# Patient Record
Sex: Male | Born: 1935 | Race: White | Hispanic: No | Marital: Married | Smoking: Former smoker
Health system: Southern US, Community
[De-identification: ages and names within clinical notes are randomized; demographics above are authoritative.]

## PROBLEM LIST (undated history)

## (undated) DIAGNOSIS — R053 Chronic cough: Secondary | ICD-10-CM

## (undated) DIAGNOSIS — S060X9A Concussion with loss of consciousness of unspecified duration, initial encounter: Secondary | ICD-10-CM

## (undated) DIAGNOSIS — R269 Unspecified abnormalities of gait and mobility: Secondary | ICD-10-CM

## (undated) DIAGNOSIS — R9089 Other abnormal findings on diagnostic imaging of central nervous system: Secondary | ICD-10-CM

## (undated) DIAGNOSIS — N400 Enlarged prostate without lower urinary tract symptoms: Secondary | ICD-10-CM

## (undated) DIAGNOSIS — B351 Tinea unguium: Secondary | ICD-10-CM

## (undated) DIAGNOSIS — S0291XA Unspecified fracture of skull, initial encounter for closed fracture: Secondary | ICD-10-CM

## (undated) DIAGNOSIS — I10 Essential (primary) hypertension: Secondary | ICD-10-CM

## (undated) DIAGNOSIS — N183 Chronic kidney disease, stage 3 unspecified: Secondary | ICD-10-CM

## (undated) DIAGNOSIS — E785 Hyperlipidemia, unspecified: Secondary | ICD-10-CM

## (undated) DIAGNOSIS — G4733 Obstructive sleep apnea (adult) (pediatric): Secondary | ICD-10-CM

## (undated) DIAGNOSIS — K219 Gastro-esophageal reflux disease without esophagitis: Secondary | ICD-10-CM

## (undated) DIAGNOSIS — C44519 Basal cell carcinoma of skin of other part of trunk: Secondary | ICD-10-CM

## (undated) DIAGNOSIS — R6882 Decreased libido: Secondary | ICD-10-CM

## (undated) DIAGNOSIS — R413 Other amnesia: Secondary | ICD-10-CM

## (undated) DIAGNOSIS — M545 Low back pain, unspecified: Secondary | ICD-10-CM

## (undated) DIAGNOSIS — I5189 Other ill-defined heart diseases: Secondary | ICD-10-CM

## (undated) DIAGNOSIS — G4730 Sleep apnea, unspecified: Secondary | ICD-10-CM

## (undated) DIAGNOSIS — G629 Polyneuropathy, unspecified: Secondary | ICD-10-CM

## (undated) DIAGNOSIS — M21372 Foot drop, left foot: Secondary | ICD-10-CM

## (undated) DIAGNOSIS — M21371 Foot drop, right foot: Secondary | ICD-10-CM

## (undated) DIAGNOSIS — N529 Male erectile dysfunction, unspecified: Secondary | ICD-10-CM

## (undated) DIAGNOSIS — R972 Elevated prostate specific antigen [PSA]: Secondary | ICD-10-CM

## (undated) HISTORY — DX: Essential (primary) hypertension: I10

## (undated) HISTORY — PX: OTHER SURGICAL HISTORY: SHX169

## (undated) HISTORY — DX: Chronic kidney disease, stage 3 unspecified: N18.30

## (undated) HISTORY — DX: Decreased libido: R68.82

## (undated) HISTORY — DX: Low back pain: M54.5

## (undated) HISTORY — PX: CATARACT EXTRACTION: SUR2

## (undated) HISTORY — DX: Obstructive sleep apnea (adult) (pediatric): G47.33

## (undated) HISTORY — DX: Elevated prostate specific antigen (PSA): R97.20

## (undated) HISTORY — DX: Unspecified fracture of skull, initial encounter for closed fracture: S02.91XA

## (undated) HISTORY — DX: Hyperlipidemia, unspecified: E78.5

## (undated) HISTORY — DX: Unspecified abnormalities of gait and mobility: R26.9

## (undated) HISTORY — DX: Basal cell carcinoma of skin of other part of trunk: C44.519

## (undated) HISTORY — DX: Foot drop, left foot: M21.372

## (undated) HISTORY — DX: Chronic cough: R05.3

## (undated) HISTORY — DX: Benign prostatic hyperplasia without lower urinary tract symptoms: N40.0

## (undated) HISTORY — DX: Concussion with loss of consciousness of unspecified duration, initial encounter: S06.0X9A

## (undated) HISTORY — DX: Other ill-defined heart diseases: I51.89

## (undated) HISTORY — DX: Polyneuropathy, unspecified: G62.9

## (undated) HISTORY — DX: Foot drop, right foot: M21.371

## (undated) HISTORY — PX: VASECTOMY: SHX75

## (undated) HISTORY — DX: Low back pain, unspecified: M54.50

## (undated) HISTORY — PX: TONSILLECTOMY: SUR1361

## (undated) HISTORY — DX: Gastro-esophageal reflux disease without esophagitis: K21.9

## (undated) HISTORY — DX: Other amnesia: R41.3

## (undated) HISTORY — DX: Other abnormal findings on diagnostic imaging of central nervous system: R90.89

## (undated) HISTORY — DX: Tinea unguium: B35.1

## (undated) HISTORY — DX: Male erectile dysfunction, unspecified: N52.9

## (undated) HISTORY — PX: KNEE SURGERY: SHX244

---
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
## 2000-07-03 ENCOUNTER — Encounter: Payer: Self-pay | Admitting: Family Medicine

## 2000-07-03 ENCOUNTER — Ambulatory Visit (HOSPITAL_COMMUNITY): Admission: RE | Admit: 2000-07-03 | Discharge: 2000-07-03 | Payer: Self-pay | Admitting: Family Medicine

## 2000-08-24 ENCOUNTER — Encounter: Payer: Self-pay | Admitting: *Deleted

## 2000-08-24 ENCOUNTER — Ambulatory Visit (HOSPITAL_COMMUNITY): Admission: RE | Admit: 2000-08-24 | Discharge: 2000-08-24 | Payer: Self-pay | Admitting: *Deleted

## 2000-10-01 ENCOUNTER — Ambulatory Visit (HOSPITAL_COMMUNITY): Admission: RE | Admit: 2000-10-01 | Discharge: 2000-10-01 | Payer: Self-pay | Admitting: *Deleted

## 2002-01-03 ENCOUNTER — Encounter: Payer: Self-pay | Admitting: Family Medicine

## 2002-01-03 ENCOUNTER — Ambulatory Visit (HOSPITAL_COMMUNITY): Admission: RE | Admit: 2002-01-03 | Discharge: 2002-01-03 | Payer: Self-pay | Admitting: Family Medicine

## 2004-06-10 ENCOUNTER — Ambulatory Visit: Payer: Self-pay | Admitting: Pulmonary Disease

## 2004-06-13 ENCOUNTER — Ambulatory Visit: Payer: Self-pay | Admitting: Pulmonary Disease

## 2004-06-22 ENCOUNTER — Ambulatory Visit: Payer: Self-pay | Admitting: Pulmonary Disease

## 2005-06-19 ENCOUNTER — Encounter: Admission: RE | Admit: 2005-06-19 | Discharge: 2005-06-19 | Payer: Self-pay | Admitting: Internal Medicine

## 2005-08-10 ENCOUNTER — Ambulatory Visit (HOSPITAL_COMMUNITY): Admission: RE | Admit: 2005-08-10 | Discharge: 2005-08-10 | Payer: Self-pay | Admitting: Family Medicine

## 2005-08-10 ENCOUNTER — Encounter: Payer: Self-pay | Admitting: Vascular Surgery

## 2006-01-22 ENCOUNTER — Encounter: Admission: RE | Admit: 2006-01-22 | Discharge: 2006-01-22 | Payer: Self-pay | Admitting: Orthopedic Surgery

## 2006-01-25 ENCOUNTER — Ambulatory Visit (HOSPITAL_BASED_OUTPATIENT_CLINIC_OR_DEPARTMENT_OTHER): Admission: RE | Admit: 2006-01-25 | Discharge: 2006-01-25 | Payer: Self-pay | Admitting: Orthopedic Surgery

## 2007-09-06 ENCOUNTER — Encounter: Admission: RE | Admit: 2007-09-06 | Discharge: 2007-09-06 | Payer: Self-pay | Admitting: Internal Medicine

## 2010-08-05 NOTE — Op Note (Signed)
NAME:  Timothy Khan, Timothy Khan          ACCOUNT NO.:  [id number]   MEDICAL RECORD NO.:  [id number]          PATIENT TYPE:  AMB   LOCATION:  [facility]                         FACILITY:  [facility]   PHYSICIAN:  Ollen Gross, M.D.    DATE OF BIRTH:  [birth year]   DATE OF PROCEDURE:  01/25/2006  DATE OF DISCHARGE:                                 OPERATIVE REPORT   PREOPERATIVE DIAGNOSIS:  Right knee medial meniscal tear.   POSTOPERATIVE DIAGNOSIS:  Right knee medial meniscal tear plus chondral  defect medial femoral condyle.   PROCEDURE:  Right knee arthroscopy with meniscal debridement and  chondroplasty.   SURGEON:  Ollen Gross, M.D.   ASSISTANT:  None.   ANESTHESIA:  General.   ESTIMATED BLOOD LOSS:  Minimal.   DRAINS:  None.   COMPLICATIONS:  None.  Condition stable to recovery.   CLINICAL NOTE:  [name] is a 77 year old male injured his right knee  this past summer with significant pain and mechanical symptoms.  Exam and  history suggest a medial meniscal tear confirmed by MRI.  Presents now for  arthroscopy and debridement.   PROCEDURE IN DETAIL:  After successful administration of general anesthetic,  a tourniquet placed high on the right thigh and right lower extremity  prepped and draped in the usual sterile fashion.  Standard superomedial and  inferolateral incisions were made.  In flow cannula passed superomedial,  camera passed anterolateral.  Arthroscopic visualization proceeds.  The  undersurface of patella showed some low grade chondromalacia and trochlea  looks normal.  Medial and lateral gutters visualized.  There were no loose  bodies.  Flexion valgus force applied to the knee and the medial compartment  centered.  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  Spinal needles used to localize the inferomedial portal.  Small  incision made, dilator placed and then  we debrided the meniscus back to  stable base with baskets and a 4.2 mm shaver.  The area of chondral  delamination is debrided back to stable cartilaginous base with the 4.2 mm  shaver and then probed and found to be stable.  Intercondylar notch was  visualized, ACL was normal.  Lateral compartment centered and there is a  very small focal chondral defect but it is probed and is stable.  Lateral  meniscus and tibial plateau looked normal.  A defect on the center of the  lateral femoral condyle is about 3 mm x 4 mm.  The arthroscopic equipment is  then removed from the inferior portals which are closed with interrupted 4-0  nylon.  20 mL of 0.25% Marcaine with epi injected through the inflow cannula  and that is removed and that portal closed with nylon.  A bulky sterile  dressing is applied and he is subsequently awakened and transferred to  recovery in stable condition.      Ollen Gross, M.D.  Electronically Signed     FA/MEDQ  D:  01/25/2006  T:  01/26/2006  Job:  [id number]

## 2010-08-05 NOTE — Cardiovascular Report (Signed)
[facility]  Patient:    Timothy Khan, Timothy Khan                 MRN: [id number] Proc. Date: 08/24/00 Adm. Date:  [id number] Disc. Date: [id number] Attending:  Mora Appl CC:         Meredith Staggers, M.D.   Cardiac Catheterization  REFERRING PHYSICIAN:  Meredith Staggers, M.D.  INDICATIONS FOR PROCEDURE:  The patient is a very pleasant 77 year old gentleman, who was referred for evaluation of dyspnea.  He notes that he has been an avid runner in the past running approximately 20 miles per week, but has recently noticed his stress tolerance has decreased.  He was scheduled for a stress Cardiolite at 4 minutes into the test and developed some shortness of breath which was accompanied by significant ST depression.  There was no complaint of chest pain during the stress test.  Risk factors are significant for age, family history, male sex.  Cardiolite portion of the test revealed normal Cardiolite uptake.  Risks, benefits, and options were discussed with the patient and he wishes to proceed with coronary angiography.  DESCRIPTION OF PROCEDURE:  After obtaining written informed consent, the patient was brought to the cardiac catheterization lab in the postabsorptive state.  Preoperative sedation was achieved with IV Versed.  The right groin was prepped and draped in the usual sterile fashion. Local anesthesia was achieved using 1% Xylocaine.  A 6 French hemostasis sheath was placed into the right femoral artery using a modified Seldinger technique.  Selective coronary angiography was performed using a JL4, JR4 Judkins catheter.  All catheter exchange were made over a guide wire.  Single plane ventriculogram was performed in the RAO position using a 6 French pigtail curved catheter. Following review of the films there was no identifiable coronary artery disease.  The patient was transferred to the holding area.  The hemostasis sheath was  removed.  Hemostasis was achieved using digital pressure.  FINDINGS:  The aorta pressure is 139/62, LV pressure is 139/17.  Single plane ventriculogram revealed normal wall motion.  Ejection fraction of 70%.  Left main coronary artery:  Left main coronary artery bifurcated into the left anterior descending and circumflex vessel.  There was no significant disease in the left main coronary artery.  Left anterior descending:  Left anterior descending gave rise to a small diagonal #1, large diagonal #2 and ended as an apical recurrent branch.  There was no significant disease in the left anterior descending or its branches.  Circumflex vessel:  The circumflex vessel gave rise to a small OM-1, OM-2, moderate sized OM-3 and OM-4.  There was no significant disease in the obtuse marginals.  Right coronary artery:  The right coronary artery was dominant giving rise to a moderate sized PDA and PL branch.  There was no significant disease in the right coronary artery or its branches.  IMPRESSION: 1. Normal coronary angiography. 2. Normal ventriculogram, ejection fraction of approximately 70%.  RECOMMENDATIONS:  We will consider other etiologies for his dyspnea.  A chest x-ray will be obtained.  Consideration of pulmonary function test will be considered.  However, should note that there was no wheezing on his stress portion. DD:  08/24/00 TD:  08/24/00 Job: [id number]

## 2011-04-11 ENCOUNTER — Other Ambulatory Visit: Payer: Self-pay | Admitting: Internal Medicine

## 2011-04-11 DIAGNOSIS — M5430 Sciatica, unspecified side: Secondary | ICD-10-CM

## 2011-04-14 ENCOUNTER — Ambulatory Visit
Admission: RE | Admit: 2011-04-14 | Discharge: 2011-04-14 | Disposition: A | Discharge status: Home / Self Care | Payer: Medicare Other | Source: Ambulatory Visit | Attending: Internal Medicine | Admitting: Internal Medicine

## 2011-04-14 DIAGNOSIS — M5430 Sciatica, unspecified side: Secondary | ICD-10-CM

## 2012-03-18 DIAGNOSIS — E785 Hyperlipidemia, unspecified: Secondary | ICD-10-CM | POA: Insufficient documentation

## 2012-03-18 DIAGNOSIS — S060X0A Concussion without loss of consciousness, initial encounter: Secondary | ICD-10-CM | POA: Insufficient documentation

## 2012-03-18 DIAGNOSIS — G609 Hereditary and idiopathic neuropathy, unspecified: Secondary | ICD-10-CM | POA: Insufficient documentation

## 2012-03-18 DIAGNOSIS — IMO0002 Reserved for concepts with insufficient information to code with codable children: Secondary | ICD-10-CM | POA: Insufficient documentation

## 2012-03-18 DIAGNOSIS — I10 Essential (primary) hypertension: Secondary | ICD-10-CM | POA: Insufficient documentation

## 2012-03-18 DIAGNOSIS — R269 Unspecified abnormalities of gait and mobility: Secondary | ICD-10-CM | POA: Insufficient documentation

## 2012-09-04 ENCOUNTER — Encounter: Payer: Self-pay | Admitting: Neurology

## 2012-09-04 DIAGNOSIS — R269 Unspecified abnormalities of gait and mobility: Secondary | ICD-10-CM

## 2012-09-04 DIAGNOSIS — I10 Essential (primary) hypertension: Secondary | ICD-10-CM

## 2012-09-04 DIAGNOSIS — G609 Hereditary and idiopathic neuropathy, unspecified: Secondary | ICD-10-CM

## 2012-09-04 DIAGNOSIS — E785 Hyperlipidemia, unspecified: Secondary | ICD-10-CM

## 2012-09-04 DIAGNOSIS — IMO0002 Reserved for concepts with insufficient information to code with codable children: Secondary | ICD-10-CM

## 2012-09-08 ENCOUNTER — Other Ambulatory Visit: Payer: Self-pay

## 2012-09-08 MED ORDER — GABAPENTIN 100 MG PO CAPS: 100.0000 mg | ORAL_CAPSULE | Freq: Every day | ORAL | Status: DC

## 2012-09-08 NOTE — Telephone Encounter (Signed)
Former [facility] patient assigned to Dr Willis  

## 2012-09-16 ENCOUNTER — Ambulatory Visit: Payer: Self-pay | Admitting: Neurology

## 2012-10-14 ENCOUNTER — Ambulatory Visit (INDEPENDENT_AMBULATORY_CARE_PROVIDER_SITE_OTHER): Payer: Medicare Other | Admitting: Neurology

## 2012-10-14 ENCOUNTER — Encounter: Payer: Self-pay | Admitting: Neurology

## 2012-10-14 VITALS — BP 128/76 | HR 83 | Wt 172.0 lb

## 2012-10-14 DIAGNOSIS — G609 Hereditary and idiopathic neuropathy, unspecified: Secondary | ICD-10-CM

## 2012-10-14 DIAGNOSIS — IMO0002 Reserved for concepts with insufficient information to code with codable children: Secondary | ICD-10-CM

## 2012-10-14 DIAGNOSIS — R269 Unspecified abnormalities of gait and mobility: Secondary | ICD-10-CM

## 2012-10-14 MED ORDER — GABAPENTIN 300 MG PO CAPS: 300.0000 mg | ORAL_CAPSULE | Freq: Every day | ORAL | Status: DC

## 2012-10-14 NOTE — Progress Notes (Signed)
Reason for visit: Peripheral neuropathy  Timothy Khan is an 79 y.o. male  History of present illness:  Timothy Khan is a 79 year old right-handed white male with a history of a peripheral neuropathy associated with a gait disorder. She falls on occasion, with the last fall about 3 months ago. The patient indicates that he is at risk for falling when he makes a sudden turn, or when he is in a situation where it his vision is impaired and he is standing. The patient will use a cane or a walking stick when he is out on uneven ground. The patient reports that he does not have significant issues with burning or stinging in the feet, but most of his pain is associated with his low back. The patient is active, and he is involved with silver sneakers, yoga, and cardiovascular workouts throughout the week. The patient has had MRI evaluation of the lumbosacral spine that shows evidence of disc disease at the L3-4 level with the right L3 nerve root compressed, and bilateral neuroforaminal stenosis at the L4-5 level and a small disc herniation at the L5-S1 level. The patient used to have pain down the right leg to the knee, but he indicates that chiropractic treatments have helped him. The patient denies any weakness of the lower extremities. The patient is on Ultram at night, but this does not help. The patient was on gabapentin taking 100-200 mg at night, but this also did not help. The patient returns for an evaluation.  Past Medical History  Diagnosis Date  . Hypertension   . Hyperlipidemia   . Concussion 1950    Baseball injury  . Abnormal brain MRI     Right side  . Basal cell carcinoma of back   . Gastroesophageal reflux disease   . Peripheral neuropathy   . Gait disturbance   . BPH (benign prostatic hyperplasia)   . Lumbago     History reviewed. No pertinent past surgical history.  Family History  Problem Relation Age of Onset  . Cancer Mother     Breast cancer  . Heart attack  Father   . Heart disease Brother     Social history:  reports that he quit smoking about 28 years ago. He does not have any smokeless tobacco history on file. He reports that he does not drink alcohol or use illicit drugs.  Allergies:  Allergies  Allergen Reactions  . Codeine     Medications:  Current Outpatient Prescriptions on File Prior to Visit  Medication Sig Dispense Refill  . Cholecalciferol (VITAMIN D3) 2000 UNITS capsule Take 2,000 Units by mouth daily.      Marland Kitchen esomeprazole (NEXIUM) 40 MG packet Take 40 mg by mouth daily before breakfast.      . fexofenadine (ALLEGRA) 180 MG tablet Take 180 mg by mouth daily.      . fish oil-omega-3 fatty acids 1000 MG capsule Take 2 g by mouth daily.      . fluticasone (FLOVENT DISKUS) 50 MCG/BLIST diskus inhaler Inhale 1 puff into the lungs 2 (two) times daily.      Marland Kitchen lisinopril-hydrochlorothiazide (PRINZIDE,ZESTORETIC) 20-12.5 MG per tablet Take 1 tablet by mouth daily.      . Multiple Vitamin (MULTIVITAMIN) capsule Take 1 capsule by mouth daily.      . psyllium (METAMUCIL) 58.6 % packet Take 1 packet by mouth daily.      . simvastatin (ZOCOR) 20 MG tablet Take 20 mg by mouth every evening.      Marland Kitchen  tamsulosin (FLOMAX) 0.4 MG CAPS Take 0.4 mg by mouth daily.       No current facility-administered medications on file prior to visit.    ROS:  Out of a complete 14 system review of symptoms, the patient complains only of the following symptoms, and all other reviewed systems are negative.  Ringing in the ears Cough Numbness Gait disturbance  Blood pressure 128/76, pulse 83, weight 172 lb (78.019 kg).  Physical Exam  General: The patient is alert and cooperative at the time of the examination.  Skin: No significant peripheral edema is noted.   Neurologic Exam  Cranial nerves: Facial symmetry is present. Speech is normal, no aphasia or dysarthria is noted. Extraocular movements are full. Visual fields are full.  Motor: The  patient has good strength in all 4 extremities.  Coordination: The patient has good finger-nose-finger and heel-to-shin bilaterally.  Gait and station: The patient has a slightly wide-based, unsteady gait. Tandem gait is unsteady. Romberg is positive, the patient falls forward. No drift is seen.  Reflexes: Deep tendon reflexes are symmetric, but are depressed.   Assessment/Plan:  1. Peripheral neuropathy  2. Gait disturbance  3. Lumbosacral spine disease, low back pain  The patient continues to have low back pain, mainly in the evening hours. The patient will be given a trial on a higher dose of gabapentin, starting at 300 mg at night. If this is effective, he is to contact our office if he needs to go up on the dose. The patient may benefit from epidural steroid injections in the future, and if he desires this, he will contact our office. The patient otherwise will followup in 6 or 7 months.  Timothy Palau MD 10/14/2012 7:00 PM  [facility] [address] [place], [place] [address]  Phone [phone] Fax [phone]

## 2013-03-18 ENCOUNTER — Other Ambulatory Visit: Payer: Self-pay | Admitting: Neurology

## 2013-04-30 ENCOUNTER — Emergency Department (HOSPITAL_COMMUNITY): Payer: Medicare Other

## 2013-04-30 ENCOUNTER — Observation Stay (HOSPITAL_COMMUNITY)
Admission: EM | Admit: 2013-04-30 | Discharge: 2013-05-01 | Disposition: A | Discharge status: Still In Hospital | Payer: Medicare Other | Attending: Emergency Medicine | Admitting: Emergency Medicine

## 2013-04-30 ENCOUNTER — Encounter (HOSPITAL_COMMUNITY): Payer: Self-pay | Admitting: Emergency Medicine

## 2013-04-30 DIAGNOSIS — M216X9 Other acquired deformities of unspecified foot: Secondary | ICD-10-CM | POA: Insufficient documentation

## 2013-04-30 DIAGNOSIS — S0100XA Unspecified open wound of scalp, initial encounter: Secondary | ICD-10-CM

## 2013-04-30 DIAGNOSIS — G629 Polyneuropathy, unspecified: Secondary | ICD-10-CM | POA: Diagnosis present

## 2013-04-30 DIAGNOSIS — W010XXA Fall on same level from slipping, tripping and stumbling without subsequent striking against object, initial encounter: Secondary | ICD-10-CM | POA: Insufficient documentation

## 2013-04-30 DIAGNOSIS — N400 Enlarged prostate without lower urinary tract symptoms: Secondary | ICD-10-CM | POA: Insufficient documentation

## 2013-04-30 DIAGNOSIS — I6200 Nontraumatic subdural hemorrhage, unspecified: Secondary | ICD-10-CM

## 2013-04-30 DIAGNOSIS — S065XAA Traumatic subdural hemorrhage with loss of consciousness status unknown, initial encounter: Secondary | ICD-10-CM

## 2013-04-30 DIAGNOSIS — K219 Gastro-esophageal reflux disease without esophagitis: Secondary | ICD-10-CM | POA: Insufficient documentation

## 2013-04-30 DIAGNOSIS — Y9301 Activity, walking, marching and hiking: Secondary | ICD-10-CM | POA: Insufficient documentation

## 2013-04-30 DIAGNOSIS — I10 Essential (primary) hypertension: Secondary | ICD-10-CM | POA: Diagnosis present

## 2013-04-30 DIAGNOSIS — S0280XA Fracture of other specified skull and facial bones, unspecified side, initial encounter for closed fracture: Secondary | ICD-10-CM

## 2013-04-30 DIAGNOSIS — W19XXXA Unspecified fall, initial encounter: Secondary | ICD-10-CM

## 2013-04-30 DIAGNOSIS — M545 Low back pain, unspecified: Secondary | ICD-10-CM | POA: Insufficient documentation

## 2013-04-30 DIAGNOSIS — S065X9A Traumatic subdural hemorrhage with loss of consciousness of unspecified duration, initial encounter: Secondary | ICD-10-CM | POA: Diagnosis present

## 2013-04-30 DIAGNOSIS — Y929 Unspecified place or not applicable: Secondary | ICD-10-CM | POA: Insufficient documentation

## 2013-04-30 DIAGNOSIS — Z87891 Personal history of nicotine dependence: Secondary | ICD-10-CM | POA: Insufficient documentation

## 2013-04-30 DIAGNOSIS — S0101XA Laceration without foreign body of scalp, initial encounter: Secondary | ICD-10-CM

## 2013-04-30 DIAGNOSIS — E785 Hyperlipidemia, unspecified: Secondary | ICD-10-CM | POA: Insufficient documentation

## 2013-04-30 DIAGNOSIS — R269 Unspecified abnormalities of gait and mobility: Secondary | ICD-10-CM | POA: Diagnosis present

## 2013-04-30 DIAGNOSIS — G609 Hereditary and idiopathic neuropathy, unspecified: Secondary | ICD-10-CM | POA: Insufficient documentation

## 2013-04-30 DIAGNOSIS — S0291XA Unspecified fracture of skull, initial encounter for closed fracture: Secondary | ICD-10-CM | POA: Diagnosis present

## 2013-04-30 HISTORY — DX: Traumatic subdural hemorrhage with loss of consciousness status unknown, initial encounter: S06.5XAA

## 2013-04-30 LAB — CBC WITH DIFFERENTIAL/PLATELET
BASOS ABS: 0 10*3/uL (ref 0.0–0.1)
BASOS PCT: 0 % (ref 0–1)
EOS PCT: 1 % (ref 0–5)
Eosinophils Absolute: 0.1 10*3/uL (ref 0.0–0.7)
HEMATOCRIT: 41 % (ref 39.0–52.0)
Hemoglobin: 14.4 g/dL (ref 13.0–17.0)
Lymphocytes Relative: 11 % — ABNORMAL LOW (ref 12–46)
Lymphs Abs: 1.2 10*3/uL (ref 0.7–4.0)
MCH: 29.6 pg (ref 26.0–34.0)
MCHC: 35.1 g/dL (ref 30.0–36.0)
MCV: 84.4 fL (ref 78.0–100.0)
MONO ABS: 0.6 10*3/uL (ref 0.1–1.0)
Monocytes Relative: 6 % (ref 3–12)
Neutro Abs: 9.2 10*3/uL — ABNORMAL HIGH (ref 1.7–7.7)
Neutrophils Relative %: 83 % — ABNORMAL HIGH (ref 43–77)
Platelets: 246 10*3/uL (ref 150–400)
RBC: 4.86 MIL/uL (ref 4.22–5.81)
RDW: 12.5 % (ref 11.5–15.5)
WBC: 11.1 10*3/uL — ABNORMAL HIGH (ref 4.0–10.5)

## 2013-04-30 LAB — BASIC METABOLIC PANEL
BUN: 17 mg/dL (ref 6–23)
CALCIUM: 9.2 mg/dL (ref 8.4–10.5)
CO2: 27 mEq/L (ref 19–32)
CREATININE: 1.15 mg/dL (ref 0.50–1.35)
Chloride: 94 mEq/L — ABNORMAL LOW (ref 96–112)
GFR, EST AFRICAN AMERICAN: 68 mL/min — AB (ref >90)
GFR, EST NON AFRICAN AMERICAN: 59 mL/min — AB (ref >90)
Glucose, Bld: 103 mg/dL — ABNORMAL HIGH (ref 70–99)
Potassium: 4.2 mEq/L (ref 3.7–5.3)
Sodium: 132 mEq/L — ABNORMAL LOW (ref 137–147)

## 2013-04-30 LAB — PROTIME-INR
INR: 0.89 (ref 0.00–1.49)
PROTHROMBIN TIME: 11.9 s (ref 11.6–15.2)

## 2013-04-30 MED ORDER — CEPHALEXIN 500 MG PO CAPS
500.0000 mg | ORAL_CAPSULE | Freq: Two times a day (BID) | ORAL | Status: DC
  Administered 2013-05-01 (×2): 500 mg via ORAL
  Filled 2013-04-30 (×3): qty 1

## 2013-04-30 NOTE — ED Notes (Signed)
Bed: WA02 Expected date:  Expected time:  Means of arrival:  Comments: EMS 

## 2013-04-30 NOTE — ED Provider Notes (Addendum)
CSN: [id number]     Arrival date & time 04/30/13  1735 History   First MD Initiated Contact with Patient 04/30/13 1753     Chief Complaint  Patient presents with  . Fall     (Consider location/radiation/quality/duration/timing/severity/associated sxs/prior Treatment) HPI  [name] is a 80 year old man who was going out to his wife. He states that he lost his balance and fell to the ground striking his head on a brick. This occurred just prior to arrival. He states he thinks that he lost his balance to 2 his peripheral neuropathy. He is having some problems with his balance which has been evaluated by the primary care physician and he states he has been seeing a neurologist for this. He denies any lightheadedness or loss of consciousness prior to the fall or after striking his head. He states that he was sitting up but did not get to his feet prior to EMS coming to bring him to the emergency department. He was collared and had a dressing placed prior to being transported. He denies any neck pain, numbness, tingling, weakness, or any other injury besides the laceration to his head. He is not on blood thinners.  Patient states he has had Tdap In Dr. Delene Ruffini office within the past 5-10  Past Medical History  Diagnosis Date  . Hypertension   . Hyperlipidemia   . Concussion 1950    Baseball injury  . Abnormal brain MRI     Right side  . Basal cell carcinoma of back   . Gastroesophageal reflux disease   . Peripheral neuropathy   . Gait disturbance   . BPH (benign prostatic hyperplasia)   . Lumbago    History reviewed. No pertinent past surgical history. Family History  Problem Relation Age of Onset  . Cancer Mother     Breast cancer  . Heart attack Father   . Heart disease Brother    History  Substance Use Topics  . Smoking status: Former Smoker    Quit date: 08/18/1984  . Smokeless tobacco: Not on file  . Alcohol Use: No    Review of Systems  All other systems reviewed  and are negative.      Allergies  Codeine  Home Medications   Current Outpatient Rx  Name  Route  Sig  Dispense  Refill  . Cholecalciferol (VITAMIN D3) 2000 UNITS capsule   Oral   Take 2,000 Units by mouth daily.         Marland Kitchen esomeprazole (NEXIUM) 40 MG packet   Oral   Take 40 mg by mouth daily before breakfast.         . fexofenadine (ALLEGRA) 180 MG tablet   Oral   Take 180 mg by mouth daily.         . fish oil-omega-3 fatty acids 1000 MG capsule   Oral   Take 2 g by mouth daily.         . fluticasone (FLONASE) 50 MCG/ACT nasal spray               . fluticasone (FLOVENT DISKUS) 50 MCG/BLIST diskus inhaler   Inhalation   Inhale 1 puff into the lungs 2 (two) times daily.         Marland Kitchen gabapentin (NEURONTIN) 300 MG capsule      TAKE 1 CAPSULE (300 MG TOTAL) BY MOUTH AT BEDTIME.   30 capsule   1   . lisinopril-hydrochlorothiazide (PRINZIDE,ZESTORETIC) 20-12.5 MG per tablet   Oral  Take 1 tablet by mouth daily.         Marland Kitchen losartan-hydrochlorothiazide (HYZAAR) 50-12.5 MG per tablet   Oral   Take 1 tablet by mouth daily.          . Multiple Vitamin (MULTIVITAMIN) capsule   Oral   Take 1 capsule by mouth daily.         . psyllium (METAMUCIL) 58.6 % packet   Oral   Take 1 packet by mouth daily.         . simvastatin (ZOCOR) 20 MG tablet   Oral   Take 20 mg by mouth every evening.         . tamsulosin (FLOMAX) 0.4 MG CAPS   Oral   Take 0.4 mg by mouth daily.         . traMADol (ULTRAM) 50 MG tablet   Oral   Take 50 mg by mouth daily.          BP 161/85  Pulse 80  Temp(Src) 98.1 F (36.7 C) (Oral)  Resp 13  SpO2 93% Physical Exam  Nursing note and vitals reviewed. Constitutional: He is oriented to person, place, and time. He appears well-developed and well-nourished.  HENT:  Head: Normocephalic and atraumatic.  Right Ear: Tympanic membrane and external ear normal.  Left Ear: Tympanic membrane and external ear normal.   Nose: Nose normal. Right sinus exhibits no maxillary sinus tenderness and no frontal sinus tenderness. Left sinus exhibits no maxillary sinus tenderness and no frontal sinus tenderness.  Mouth/Throat: Oropharynx is clear and moist.  6 cm laceration to occiput  No ttp , crepitus, or swelling over front or top of head  Eyes: Conjunctivae and EOM are normal. Pupils are equal, round, and reactive to light. Right eye exhibits no nystagmus. Left eye exhibits no nystagmus.  Neck: Normal range of motion. Neck supple.  No tenderness to palpation. Patient has full active range of movement without pain.  Cardiovascular: Normal rate, regular rhythm, normal heart sounds and intact distal pulses.   Pulmonary/Chest: Effort normal and breath sounds normal. No respiratory distress. He exhibits no tenderness.  Abdominal: Soft. Bowel sounds are normal. He exhibits no distension and no mass. There is no tenderness.  Musculoskeletal: Normal range of motion. He exhibits no edema and no tenderness.  Full active range of motion of all extremities with no tenderness to palpation or external visual signs of trauma.  Neurological: He is alert and oriented to person, place, and time. He has normal strength and normal reflexes. He displays normal reflexes. No cranial nerve deficit or sensory deficit. He displays a negative Romberg sign. Coordination normal. GCS eye subscore is 4. GCS verbal subscore is 5. GCS motor subscore is 6.  Reflex Scores:      Tricep reflexes are 2+ on the right side and 2+ on the left side.      Bicep reflexes are 2+ on the right side and 2+ on the left side.      Brachioradialis reflexes are 2+ on the right side and 2+ on the left side.      Patellar reflexes are 2+ on the right side and 2+ on the left side.      Achilles reflexes are 2+ on the right side and 2+ on the left side. Patient with normal gait without ataxia, shuffling, spasm, or antalgia. Speech is normal without dysarthria, dysphasia,  or aphasia. Muscle strength is 5/5 in bilateral shoulders, elbow flexor and extensors, wrist flexor and extensors, and  intrinsic hand muscles. 5/5 bilateral lower extremity hip flexors, extensors, knee flexors and extensors, and ankle dorsi and plantar flexors.    Skin: Skin is warm and dry. No rash noted.  Psychiatric: He has a normal mood and affect. His behavior is normal. Judgment and thought content normal.    ED Course  LACERATION REPAIR Date/Time: 04/30/2013 6:17 PM Performed by: Shaune Pollack Authorized by: Shaune Pollack Consent: Verbal consent obtained. Risks and benefits: risks, benefits and alternatives were discussed Consent given by: patient Patient identity confirmed: verbally with patient Time out: Immediately prior to procedure a "time out" was called to verify the correct patient, procedure, equipment, support staff and site/side marked as required. Body area: head/neck Location details: scalp Laceration length: 6 cm Foreign bodies: no foreign bodies Tendon involvement: none Nerve involvement: none Anesthesia: local infiltration Local anesthetic: lidocaine 1% with epinephrine Anesthetic total: 3 ml Patient sedated: no Preparation: Patient was prepped and draped in the usual sterile fashion. Irrigation solution: saline Irrigation method: syringe Amount of cleaning: standard Debridement: none Degree of undermining: none Skin closure: staples Number of sutures: 6 Approximation: close Approximation difficulty: simple Patient tolerance: Patient tolerated the procedure well with no immediate complications.   (including critical care time) Labs Review Labs Reviewed  CBC WITH DIFFERENTIAL - Abnormal; Notable for the following:    WBC 11.1 (*)    Neutrophils Relative % 83 (*)    Neutro Abs 9.2 (*)    Lymphocytes Relative 11 (*)    All other components within normal limits  BASIC METABOLIC PANEL - Abnormal; Notable for the following:    Sodium 132 (*)     Chloride 94 (*)    Glucose, Bld 103 (*)    GFR calc non Af Amer 59 (*)    GFR calc Af Amer 68 (*)    All other components within normal limits  PROTIME-INR   Imaging Review Ct Head Wo Contrast  04/30/2013   CLINICAL DATA:  Fall today.  Posterior head laceration.  EXAM: CT HEAD WITHOUT CONTRAST  TECHNIQUE: Contiguous axial images were obtained from the base of the skull through the vertex without intravenous contrast.  COMPARISON:  MR HEAD WO/W CM dated 09/06/2007; CT HEAD WO/W CM dated 06/19/2005  FINDINGS: High density adjacent to the falx anteriorly on images 22 and 23 appears normal, suspicious for a tiny parafalcine subdural hematoma. There is no other evidence of acute intracranial hemorrhage, mass lesion, brain edema or extra-axial fluid collection. There is stable mild generalized atrophy and mild periventricular white matter disease. Encephalomalacia along the right sylvian fissure appears stable.  The visualized paranasal sinuses, mastoid air cells and middle ears are clear. There are skin staples in the occipital scalp. There is a nondisplaced fracture of the left frontal bone extending into the parietal convexity, situated in the sagittal plane. No hemorrhage adjacent to this fracture is demonstrated.  IMPRESSION: 1. Nondisplaced linear fracture through the left frontal parietal as described. 2. Small anterior parafalcine subdural hematoma. 3. No evidence of intraparenchymal hemorrhage or other acute findings. 4. Critical Value/emergent results were called by telephone at the time of interpretation on 04/30/2013 at 6:56 PM to Dr. Pattricia Boss , who verbally acknowledged these results.   Electronically Signed   By: Camie Patience M.D.   On: 04/30/2013 18:59  I have reviewed the report and personally reviewed the above radiology studies.   EKG Interpretation    Date/Time:  Wednesday April 30 2013 17:54:55 EST Ventricular Rate:  79 PR  Interval:  213 QRS Duration: 82 QT Interval:  356 QTC  Calculation: 408 R Axis:   31 Text Interpretation:  Sinus rhythm Flattened T waves laterally, noted previously Confirmed by Jeneen Rinks  MD, [place] ([id number]) on 04/30/2013 6:02:57 PM            MDM   Final diagnoses:  None    The patient care discussed with Dr. Sherley Bounds on call for neurosurgery.. He has seen and evaluated the patient. He has allowed the patient to eat and drink. Patient continues to have a normal neurologic exam. Patient is to be admitted here to Dr. Posey Pronto on for hospitalist. I discussed patient's care with Dr. Posey Pronto and Dr. Ronnald Ramp will follow.   Shaune Pollack, MD 04/30/13 2135  Shaune Pollack, MD 04/30/13 2136

## 2013-04-30 NOTE — H&P (Signed)
[facility] History and Physical  Patient: Timothy Khan  CBS:[id number]  DOB: 05/23/1931  DOS: the patient was seen and examined on 04/30/2013 PCP: Irven Shelling, MD  Chief Complaint: Fall  HPI: Timothy Khan is a 80 y.o. 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 Lumbar stenosis The patient is coming from home. The patient presents with a fall. He mentions that she was walking out with his wife and when walking he lost his balance and fell backwards hit the corner of a wall and also the concrete floor. He did not pass out or lose consciousness did not have any chest pain shortness of breath or dizziness prior to the event. There is no change in his medications. He has history of peripheral neuropathy and since last few months he has been having progressively worsening balance issues for which he is following with his neurologist. After the fall he had some pain on his back of the head but other than that he did not have any other complaint he did not lose control of his bowel or bladder. At the time of my evaluation he did complain of some sinus congestion and he mentions he is coming over a cold. Pt denies any fever, chills, headache, cough, chest pain, palpitation, shortness of breath, orthopnea, PND, nausea, vomiting, abdominal pain, diarrhea, constipation, active bleeding, burning urination, dizziness, pedal edema,  focal neurological deficit.  No runny nose no metallic taste in mouth no excessive dripping.  Review of Systems: as mentioned in the history of present illness.  A Comprehensive review of the other systems is negative.  Past Medical History  Diagnosis Date  . Hypertension   . Hyperlipidemia   . Concussion 1950    Baseball injury  . Abnormal brain MRI     Right side  . Basal cell carcinoma of back   . Gastroesophageal reflux disease   .  Peripheral neuropathy   . Gait disturbance   . BPH (benign prostatic hyperplasia)   . Lumbago    History reviewed. No pertinent past surgical history. Social History:  reports that he quit smoking about 28 years ago. He does not have any smokeless tobacco history on file. He reports that he does not drink alcohol or use illicit drugs. Independent for most of his  ADL.  Allergies  Allergen Reactions  . Codeine Nausea Only    Family History  Problem Relation Age of Onset  . Cancer Mother     Breast cancer  . Heart attack Father   . Heart disease Brother     Prior to Admission medications   Medication Sig Start Date End Date Taking? Authorizing Provider  Cholecalciferol (VITAMIN D3) 2000 UNITS capsule Take 2,000 Units by mouth every other day.    Yes Historical Provider, MD  Coenzyme Q10 (CO Q 10) 10 MG CAPS Take 1 tablet by mouth every morning.   Yes Historical Provider, MD  esomeprazole (NEXIUM) 40 MG packet Take 40 mg by mouth daily before breakfast.   Yes Historical Provider, MD  fexofenadine (ALLEGRA) 180 MG tablet Take 180 mg by mouth daily.   Yes Historical Provider, MD  fish oil-omega-3 fatty acids 1000 MG capsule Take 1 g by mouth daily.    Yes Historical Provider, MD  fluticasone (FLONASE) 50 MCG/ACT nasal spray Place 2 sprays into both nostrils at bedtime.  04/16/13  Yes Historical Provider, MD  gabapentin (NEURONTIN) 300 MG  capsule Take 300 mg by mouth at bedtime.   Yes Historical Provider, MD  losartan-hydrochlorothiazide (HYZAAR) 50-12.5 MG per tablet Take 1 tablet by mouth daily.  04/16/13  Yes Historical Provider, MD  Multiple Vitamin (MULTIVITAMIN) capsule Take 1 capsule by mouth daily.   Yes Historical Provider, MD  psyllium (METAMUCIL) 58.6 % packet Take 1 packet by mouth daily.   Yes Historical Provider, MD  tamsulosin (FLOMAX) 0.4 MG CAPS Take 0.4 mg by mouth at bedtime.    Yes Historical Provider, MD  traMADol (ULTRAM) 50 MG tablet Take 100 mg by mouth daily.   10/01/12  Yes Historical Provider, MD    Physical Exam: Filed Vitals:   04/30/13 1753 04/30/13 2018  BP: 161/85 178/81  Pulse: 80 81  Temp: 98.1 F (36.7 C)   TempSrc: Oral   Resp: 13 20  SpO2: 93% 97%    General: Alert, Awake and Oriented to Time, Place and Person. Appear in mild distress Eyes: PERRL ENT: Oral Mucosa clear moist, no active bleed in ear or nose. Neck: no JVD Cardiovascular: S1 and S2 Present, no Murmur, Peripheral Pulses Present Respiratory: Bilateral Air entry equal and Decreased, Clear to Auscultation,  no Crackles,no wheezes Abdomen: Bowel Sound Present, Soft and Non tender Skin: no Rash Extremities: no Pedal edema, no calf tenderness Neurologic: Mental status AAOx3, no speech difficilty, Cranial Nerves PERRL, EOM present, has left upper eyelid chronic ptosis and chronic right lower lid angle droop, no tongue deviation or sensory loss on face, visual field intact, Motor strength bilateral equal strength, Sensation present to light touch bilateral, reflexes present biceps and knee, babinski negative, Proprioception abnormal-pt stats that is chronic due to his neuropathy, Cerebellar test normal finger nose finger, normal pronator drift.  Labs on Admission:  CBC:  Recent Labs Lab 04/30/13 1935  WBC 11.1*  NEUTROABS 9.2*  HGB 14.4  HCT 41.0  MCV 84.4  PLT 246    CMP     Component Value Date/Time   NA 132* 04/30/2013 1935   K 4.2 04/30/2013 1935   CL 94* 04/30/2013 1935   CO2 27 04/30/2013 1935   GLUCOSE 103* 04/30/2013 1935   BUN 17 04/30/2013 1935   CREATININE 1.15 04/30/2013 1935   CALCIUM 9.2 04/30/2013 1935   GFRNONAA 59* 04/30/2013 1935   GFRAA 68* 04/30/2013 1935    No results found for this basename: LIPASE, AMYLASE,  in the last 168 hours No results found for this basename: AMMONIA,  in the last 168 hours  No results found for this basename: CKTOTAL, CKMB, CKMBINDEX, TROPONINI,  in the last 168 hours BNP (last 3 results) No results found for  this basename: PROBNP,  in the last 8760 hours  Radiological Exams on Admission: Ct Head Wo Contrast  04/30/2013   CLINICAL DATA:  Fall today.  Posterior head laceration.  EXAM: CT HEAD WITHOUT CONTRAST  TECHNIQUE: Contiguous axial images were obtained from the base of the skull through the vertex without intravenous contrast.  COMPARISON:  MR HEAD WO/W CM dated 09/06/2007; CT HEAD WO/W CM dated 06/19/2005  FINDINGS: High density adjacent to the falx anteriorly on images 22 and 23 appears normal, suspicious for a tiny parafalcine subdural hematoma. There is no other evidence of acute intracranial hemorrhage, mass lesion, brain edema or extra-axial fluid collection. There is stable mild generalized atrophy and mild periventricular white matter disease. Encephalomalacia along the right sylvian fissure appears stable.  The visualized paranasal sinuses, mastoid air cells and middle ears are clear. There  are skin staples in the occipital scalp. There is a nondisplaced fracture of the left frontal bone extending into the parietal convexity, situated in the sagittal plane. No hemorrhage adjacent to this fracture is demonstrated.  IMPRESSION: 1. Nondisplaced linear fracture through the left frontal parietal as described. 2. Small anterior parafalcine subdural hematoma. 3. No evidence of intraparenchymal hemorrhage or other acute findings. 4. Critical Value/emergent results were called by telephone at the time of interpretation on 04/30/2013 at 6:56 PM to Dr. Pattricia Boss , who verbally acknowledged these results.   Electronically Signed   By: Camie Patience M.D.   On: 04/30/2013 18:59    EKG: Independently reviewed. normal sinus rhythm, nonspecific ST and T waves changes.  Assessment/Plan Principal Problem:   SDH (subdural hematoma) Active Problems:   Abnormality of gait   Unspecified essential hypertension   Polyneuropathy   Skull fracture   1. SDH (subdural hematoma) The patient is presenting with a fall. He  has history of peripheral neuropathy and has been having gait imbalance issues and is being worked up as an outpatient with neurology. Today after the fall he has sustained a scalp laceration and frontal-parietal fracture with small anterior subdural hematoma. At the time of my evaluation patient has chronic findings of left upper lid lag and right lower lid drop which are again nonspecific. His laceration patient has been stitched with staples by the ED physician. Neurosurgery has evaluated the patient in the ED, patient's C-spine has been cleared. Neurosurgery has recommended no acute surgical intervention required. Patient will be monitored on telemetry for observation, serial neuro checks, PTOT consultation. Will start him on seven-day course of Keflex.  2. Accelerated hypertension Patient mentions her latest blood pressure is well-controlled at home but today his blood pressure is in the 160s range as well as heart rate is mildly elevated which is likely secondary to anxiety and pain. He has not taken his medication for blood pressure today I would continue him on losartan hydrochlorothiazide. If that does not help control his blood pressure he may require when necessary hydralazine  3. Pain control Tramadol when necessary  4. Peripheral neuropathy Continue gabapentin, PTOT consultation  Consults: neurosurgery  DVT Prophylaxis: mechanical compression device Nutrition: cardiac diet  Code Status: full  Family Communication: wife was present at bedside, opportunity was given to ask question and all questions were answered satisfactorily at the time of interview. Disposition: Admitted to observation in telemetry unit [place].  Author: Berle Mull, MD [facility] Pager: [phone] 04/30/2013, 10:42 PM    If 7PM-7AM, please contact night-coverage [email or web address] Password [email or web address]

## 2013-04-30 NOTE — Consult Note (Signed)
Reason for Consult:[facility] Referring Physician: EDP  KHRISTOPHER KAPAUN is an 80 y.o. male.   HPI:  80 yo WM who fell while walking into [employer], striking the back of his head. No LOC, NO N/V, NO N/T/W, No neck pain. Wants to eat.   Past Medical History  Diagnosis Date  . Hypertension   . Hyperlipidemia   . Concussion 1950    Baseball injury  . Abnormal brain MRI     Right side  . Basal cell carcinoma of back   . Gastroesophageal reflux disease   . Peripheral neuropathy   . Gait disturbance   . BPH (benign prostatic hyperplasia)   . Lumbago     History reviewed. No pertinent past surgical history.  Allergies  Allergen Reactions  . Codeine     History  Substance Use Topics  . Smoking status: Former Smoker    Quit date: 08/18/1984  . Smokeless tobacco: Not on file  . Alcohol Use: No    Family History  Problem Relation Age of Onset  . Cancer Mother     Breast cancer  . Heart attack Father   . Heart disease Brother      Review of Systems  Positive ROS: neg  All other systems have been reviewed and were otherwise negative with the exception of those mentioned in the HPI and as above.  Objective: Vital signs in last 24 hours: Temp:  [98.1 F (36.7 C)] 98.1 F (36.7 C) (02/11 1753) Pulse Rate:  [80-81] 81 (02/11 2018) Resp:  [13-20] 20 (02/11 2018) BP: (161-178)/(81-85) 178/81 mmHg (02/11 2018) SpO2:  [93 %-97 %] 97 % (02/11 2018)  General Appearance: Alert, cooperative, no distress, appears stated age Head: Normocephalic, small stapled lac to occiput Eyes: PERRL, conjunctiva/corneas clear, EOM's intact      Neck: Supple, symmetrical, trachea midline,  Back: Symmetric, no curvature, ROM normal, no CVA tenderness Lungs: respirations unlabored Heart: Regular rate and rhythm  NEUROLOGIC:   Mental status: A&O x4, no aphasia, good attention span, Memory and fund of knowledge Motor Exam - grossly normal, normal tone and bulk Sensory Exam - grossly  normal Reflexes: symmetric, no pathologic reflexes, No Hoffman's, No clonus Coordination - grossly normal Gait - not tested Balance - not tested Cranial Nerves: I: smell Not tested  II: visual acuity  OS: na   OD: na  II: visual fields Full to confrontation  II: pupils Equal, round, reactive to light  III,VII: ptosis None  III,IV,VI: extraocular muscles  Full ROM  V: mastication Normal  V: facial light touch sensation  Normal  V,VII: corneal reflex  Present  VII: facial muscle function - upper  Normal  VII: facial muscle function - lower Normal  VIII: hearing Not tested  IX: soft palate elevation  Normal  IX,X: gag reflex Present  XI: trapezius strength  5/5  XI: sternocleidomastoid strength 5/5  XI: neck flexion strength  5/5  XII: tongue strength  Normal    Data Review Lab Results  Component Value Date   WBC 11.1* 04/30/2013   HGB 14.4 04/30/2013   HCT 41.0 04/30/2013   MCV 84.4 04/30/2013   PLT 246 04/30/2013   Lab Results  Component Value Date   NA 132* 04/30/2013   K 4.2 04/30/2013   CL 94* 04/30/2013   CO2 27 04/30/2013   BUN 17 04/30/2013   CREATININE 1.15 04/30/2013   GLUCOSE 103* 04/30/2013   Lab Results  Component Value Date   INR 0.89 04/30/2013  Radiology: Ct Head Wo Contrast  04/30/2013   CLINICAL DATA:  Fall today.  Posterior head laceration.  EXAM: CT HEAD WITHOUT CONTRAST  TECHNIQUE: Contiguous axial images were obtained from the base of the skull through the vertex without intravenous contrast.  COMPARISON:  MR HEAD WO/W CM dated 09/06/2007; CT HEAD WO/W CM dated 06/19/2005  FINDINGS: High density adjacent to the falx anteriorly on images 22 and 23 appears normal, suspicious for a tiny parafalcine subdural hematoma. There is no other evidence of acute intracranial hemorrhage, mass lesion, brain edema or extra-axial fluid collection. There is stable mild generalized atrophy and mild periventricular white matter disease. Encephalomalacia along the right sylvian  fissure appears stable.  The visualized paranasal sinuses, mastoid air cells and middle ears are clear. There are skin staples in the occipital scalp. There is a nondisplaced fracture of the left frontal bone extending into the parietal convexity, situated in the sagittal plane. No hemorrhage adjacent to this fracture is demonstrated.  IMPRESSION: 1. Nondisplaced linear fracture through the left frontal parietal as described. 2. Small anterior parafalcine subdural hematoma. 3. No evidence of intraparenchymal hemorrhage or other acute findings. 4. Critical Value/emergent results were called by telephone at the time of interpretation on 04/30/2013 at 6:56 PM to Dr. Pattricia Boss , who verbally acknowledged these results.   Electronically Signed   By: Camie Patience M.D.   On: 04/30/2013 18:59     Assessment/Plan:   80 yo WM with mild CHI atfer fall with skull fracture and minimal subdural blood along anterior falx without mas effect or shift and normal neurologic exam. Needs obs overnight and likely home in am. No need for repeat CT head unless he a change in status.  Deneice Wack S 04/30/2013 9:55 PM

## 2013-04-30 NOTE — ED Notes (Signed)
Bed: WA09 Expected date:  Expected time:  Means of arrival:  Comments: EMS 

## 2013-04-30 NOTE — ED Notes (Addendum)
Per EMS, pt fell in the parking deck of [employer]. Pt hit the crown of his head on the corner of the building. Pt has 1 inch long laceration on crown of head. Denies blood thinners, LOC, neck/back pain. ROM normal in all limbs. Pt received 4mg  zofran en route to hospital. Pt denies headache, but states he is beginning to feel drowsy.

## 2013-05-01 ENCOUNTER — Encounter (HOSPITAL_COMMUNITY): Payer: Self-pay

## 2013-05-01 LAB — COMPREHENSIVE METABOLIC PANEL
ALBUMIN: 3.6 g/dL (ref 3.5–5.2)
ALT: 25 U/L (ref 0–53)
AST: 28 U/L (ref 0–37)
Alkaline Phosphatase: 69 U/L (ref 39–117)
BILIRUBIN TOTAL: 0.5 mg/dL (ref 0.3–1.2)
BUN: 17 mg/dL (ref 6–23)
CALCIUM: 8.9 mg/dL (ref 8.4–10.5)
CHLORIDE: 99 meq/L (ref 96–112)
CO2: 27 mEq/L (ref 19–32)
CREATININE: 1.24 mg/dL (ref 0.50–1.35)
GFR calc non Af Amer: 54 mL/min — ABNORMAL LOW (ref >90)
GFR, EST AFRICAN AMERICAN: 62 mL/min — AB (ref >90)
Glucose, Bld: 98 mg/dL (ref 70–99)
Potassium: 5.1 mEq/L (ref 3.7–5.3)
Sodium: 139 mEq/L (ref 137–147)
Total Protein: 7.1 g/dL (ref 6.0–8.3)

## 2013-05-01 LAB — CBC
HEMATOCRIT: 40.2 % (ref 39.0–52.0)
Hemoglobin: 14 g/dL (ref 13.0–17.0)
MCH: 29.7 pg (ref 26.0–34.0)
MCHC: 34.8 g/dL (ref 30.0–36.0)
MCV: 85.4 fL (ref 78.0–100.0)
PLATELETS: 258 10*3/uL (ref 150–400)
RBC: 4.71 MIL/uL (ref 4.22–5.81)
RDW: 12.4 % (ref 11.5–15.5)
WBC: 13.9 10*3/uL — ABNORMAL HIGH (ref 4.0–10.5)

## 2013-05-01 LAB — PROTIME-INR
INR: 1.07 (ref 0.00–1.49)
Prothrombin Time: 13.7 seconds (ref 11.6–15.2)

## 2013-05-01 MED ORDER — LOSARTAN POTASSIUM 50 MG PO TABS
50.0000 mg | ORAL_TABLET | Freq: Every day | ORAL | Status: DC
  Administered 2013-05-01: 50 mg via ORAL
  Filled 2013-05-01: qty 1

## 2013-05-01 MED ORDER — PANTOPRAZOLE SODIUM 40 MG PO TBEC
80.0000 mg | DELAYED_RELEASE_TABLET | Freq: Every day | ORAL | Status: DC
  Administered 2013-05-01: 80 mg via ORAL
  Filled 2013-05-01: qty 2

## 2013-05-01 MED ORDER — ONDANSETRON HCL 4 MG/2ML IJ SOLN: 4.0000 mg | Freq: Four times a day (QID) | INTRAMUSCULAR | Status: DC | PRN

## 2013-05-01 MED ORDER — HYDROCHLOROTHIAZIDE 12.5 MG PO CAPS
12.5000 mg | ORAL_CAPSULE | Freq: Every day | ORAL | Status: DC
  Administered 2013-05-01: 12.5 mg via ORAL
  Filled 2013-05-01: qty 1

## 2013-05-01 MED ORDER — LOSARTAN POTASSIUM-HCTZ 50-12.5 MG PO TABS: 1.0000 | ORAL_TABLET | Freq: Every day | ORAL | Status: DC

## 2013-05-01 MED ORDER — ESOMEPRAZOLE MAGNESIUM 40 MG PO PACK: 40.0000 mg | PACK | Freq: Every day | ORAL | Status: DC

## 2013-05-01 MED ORDER — LORATADINE 10 MG PO TABS
10.0000 mg | ORAL_TABLET | Freq: Every day | ORAL | Status: DC
  Administered 2013-05-01: 10 mg via ORAL
  Filled 2013-05-01: qty 1

## 2013-05-01 MED ORDER — GABAPENTIN 300 MG PO CAPS
300.0000 mg | ORAL_CAPSULE | Freq: Every day | ORAL | Status: DC
  Administered 2013-05-01: 300 mg via ORAL
  Filled 2013-05-01 (×2): qty 1

## 2013-05-01 MED ORDER — SODIUM CHLORIDE 0.9 % IJ SOLN
3.0000 mL | Freq: Two times a day (BID) | INTRAMUSCULAR | Status: DC
  Administered 2013-05-01: 3 mL via INTRAVENOUS

## 2013-05-01 MED ORDER — ONDANSETRON HCL 4 MG PO TABS: 4.0000 mg | ORAL_TABLET | Freq: Four times a day (QID) | ORAL | Status: DC | PRN

## 2013-05-01 MED ORDER — TRAMADOL HCL 50 MG PO TABS
100.0000 mg | ORAL_TABLET | Freq: Two times a day (BID) | ORAL | Status: DC | PRN
  Administered 2013-05-01: 100 mg via ORAL
  Filled 2013-05-01: qty 2

## 2013-05-01 MED ORDER — TAMSULOSIN HCL 0.4 MG PO CAPS
0.4000 mg | ORAL_CAPSULE | Freq: Every day | ORAL | Status: DC
Start: 2013-05-01 — End: 2013-05-01
  Administered 2013-05-01: 0.4 mg via ORAL
  Filled 2013-05-01 (×2): qty 1

## 2013-05-01 MED ORDER — PSYLLIUM 95 % PO PACK
1.0000 | PACK | Freq: Every day | ORAL | Status: DC
  Administered 2013-05-01: 1 via ORAL
  Filled 2013-05-01: qty 1

## 2013-05-01 MED ORDER — SALINE SPRAY 0.65 % NA SOLN
1.0000 | NASAL | Status: DC | PRN
  Filled 2013-05-01: qty 44

## 2013-05-01 MED ORDER — CEPHALEXIN 500 MG PO CAPS: 500.0000 mg | ORAL_CAPSULE | Freq: Two times a day (BID) | ORAL | Status: DC

## 2013-05-01 MED ORDER — FLUTICASONE PROPIONATE 50 MCG/ACT NA SUSP
2.0000 | Freq: Every day | NASAL | Status: DC
  Administered 2013-05-01: 2 via NASAL
  Filled 2013-05-01: qty 16

## 2013-05-01 NOTE — Discharge Instructions (Signed)
Outpt PT for balance training- referral sent to [facility] Neuro rehab-

## 2013-05-01 NOTE — Care Management Note (Signed)
    Page 1 of 1   05/01/2013     11:27:41 AM   CARE MANAGEMENT NOTE 05/01/2013  Patient:  Timothy Khan, Timothy Khan   Account Number:  [id number]  Date Initiated:  05/01/2013  Documentation initiated by:  Marvetta Gibbons  Subjective/Objective Assessment:   Pt admitted with SDH s/p fall     Action/Plan:   PTA pt lived at home with spouse   Anticipated DC Date:  05/01/2013   Anticipated DC Plan:  [place]  CM consult  OP Neuro Rehab      Choice offered to / List presented to:             Status of service:  Completed, signed off Medicare Important Message given?   (If response is "NO", the following Medicare IM given date fields will be blank) Date Medicare IM given:   Date Additional Medicare IM given:    Discharge Disposition:  HOME/SELF CARE  Per UR Regulation:  Reviewed for med. necessity/level of care/duration of stay  If discussed at [place] of Stay Meetings, dates discussed:    Comments:  05/01/13- Niland RN, BSN [phone] Per PT pt would benefit from outpt PT- per TC to MD- outpt referral placed in Epic to [facility] Neuro rehab- for balance training.

## 2013-05-01 NOTE — ED Notes (Signed)
[facility] called for transport. 

## 2013-05-01 NOTE — Evaluation (Signed)
Physical Therapy Evaluation Patient Details Name: Timothy Khan MRN: [id number] DOB: 1931-12-25 Today's Date: 05/01/2013 Time: 1000-1035 PT Time Calculation (min): 35 min  PT Assessment / Plan / Recommendation History of Present Illness  80 y.o. male admitted to [facility] on 04/30/13 with fall- patient has history of neuropathy.  Dx with small SDH, skull fx, and scalp laceration.    Clinical Impression  Pt is unsteady on his feet even with the support of the RW.  He has PMHx of peripheral neuropathy and this is contributing to his h/o falls.  He is open and agreeable to OP PT for balance training.   PT to follow acutely for deficits listed below.       PT Assessment  Patient needs continued PT services    Follow Up Recommendations  Outpatient PT;Supervision - Intermittent (for balance training)    Does the patient have the potential to tolerate intense rehabilitation     NA  Barriers to Discharge   None      Equipment Recommendations  None recommended by PT    Recommendations for Other Services   None  Frequency Min 3X/week    Precautions / Restrictions Precautions Precautions: Fall Precaution Comments: h/o falls due to peripherial neuropathy.    Pertinent Vitals/Pain See vitals flow sheet.       Mobility  Bed Mobility Overal bed mobility: Independent Transfers Overall transfer level: Modified independent Equipment used: Rolling walker (2 wheeled) General transfer comment: verbal cues for safe hand placement Ambulation/Gait Ambulation/Gait assistance: Supervision Ambulation Distance (Feet): 200 Feet Assistive device: Rolling walker (2 wheeled) Gait Pattern/deviations: Step-through pattern;Staggering left;Staggering right Gait velocity: too fast to be safe General Gait Details: Pt needs cues for safe use of RW and to slow down for safety.  Pt picking up entire RW to get around obstacles, losing his balance while turing with the RW.   Stairs: Yes Stairs  assistance: Modified independent (Device/Increase time) Stair Management: One rail Right;Alternating pattern;Forwards Number of Stairs: 9 General stair comments: Pt did well on the stairs with the stability of the rail, needed supervision as when he turned around at the top of the stairs with hands free, he staggered into the wall.  Advised pt/wife to keep a RW upstairs and a RW downstairs so he would not have to carry it up and down the stairs (they have two RWs and can do this).          PT Diagnosis: Difficulty walking;Abnormality of gait  PT Problem List: Decreased balance;Decreased mobility;Decreased coordination;Decreased knowledge of precautions PT Treatment Interventions: DME instruction;Gait training;Stair training;Functional mobility training;Therapeutic activities;Therapeutic exercise;Balance training;Neuromuscular re-education;Patient/family education     PT Goals(Current goals can be found in the care plan section) Acute Rehab PT Goals Patient Stated Goal: to go home, stop making "stupid mistakes" that make him fall PT Goal Formulation: With patient/family Time For Goal Achievement: 05/15/13 Potential to Achieve Goals: Good  Visit Information  Last PT Received On: 05/01/13 Assistance Needed: +1 History of Present Illness: 80 y.o. male admitted to [facility] on 04/30/13 with fall- patient has history of neuropathy.  Dx with small SDH, skull fx, and scalp laceration.         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   Communication Communication: No difficulties    Cognition  Cognition Arousal/Alertness: Awake/alert Behavior During Therapy: WFL for tasks assessed/performed Overall Cognitive Status: Within Functional Limits for tasks assessed    Extremity/Trunk Assessment Upper Extremity Assessment Upper Extremity Assessment: Overall WFL for tasks assessed Lower Extremity Assessment Lower Extremity Assessment: Overall WFL for tasks assessed Cervical / Trunk Assessment Cervical / Trunk Assessment: Normal   Balance Balance Overall balance assessment: History of Falls;Needs assistance Sitting-balance support: Feet supported Sitting balance-Leahy Scale: Good Standing balance support: No upper extremity supported Standing balance-Leahy Scale: Fair General Comments General comments (skin integrity, edema, etc.): Pt with scalp lack on the back of his head covered with a bandage.   End of Session PT - End of Session Activity Tolerance: Patient tolerated treatment well Patient left: in bed;with call bell/phone within reach;with family/visitor present (seated EOB) Nurse Communication: Mobility status;Other (comment) (need for OP PT for balance training)  GP Functional Assessment Tool Used: assist level Functional Limitation: Mobility: Walking and moving around Mobility: Walking and Moving Around Current Status ([id number]): At least 1 percent but less than 20 percent impaired, limited or restricted Mobility: Walking and Moving Around Goal Status [phone]): At least 1 percent but less than 20 percent impaired, limited or restricted Mobility: Walking and Moving Around Discharge Status [phone]): At least 1 percent but less than 20 percent impaired, limited or restricted   Crimson Dubberly B. Bronson, Hayden, [name] [phone]   05/01/2013, 11:20 AM

## 2013-05-01 NOTE — Progress Notes (Signed)
Discharge instruction and prescription given.  No questions asked.  Left via wheelchair with family members. Lorrene Reid

## 2013-05-01 NOTE — Discharge Summary (Signed)
Physician Discharge Summary  Patient ID: Timothy Khan MRN: [id number] DOB/AGE: [birth year] 80 y.o.  Admit date: 04/30/2013 Discharge date: 05/01/2013  Admission Diagnoses:  Discharge Diagnoses:  Principal Problem:   SDH (subdural hematoma)- small- - neurosurgery consulted-  Observation only Active Problems:   Abnormality of gait   Unspecified essential hypertension   Polyneuropathy   Skull fracture sccalp laceration   Discharged Condition: good  Hospital Course: 80 years old admitted after a fall- patient has history of neuropathy; while walking with his wife lost her balance- fellow on his back; head injury; with laceration on the scalp Problem #1: CT of the head- showed small  Anterior parafalcine subdural hematoma- without any midline  Shift. Lacerations with nondisplaced fronntoparietal fracture-required stable Hemodynamically stable, overnight observation- with telemetry; neuro check normal Patient walked with the walker. Complain of some soreness in the back from the fall; tramadol p.r.n. Followup one week for- staples removal. Started on Keflex for 7 days Reevaluate for physical therapy. Encourage him to use walker at home. Consults: neurosurgery  Significant Diagnostic Studies: labs: WBC count 13,000; normal blood chemistry; CT of the head as above  Treatments: IV hydration  Discharge Exam: Blood pressure 134/59, pulse 76, temperature 99.1 F (37.3 C), temperature source Oral, resp. rate 18, height 5\' 10"  (1.778 m), weight 74.662 kg (164 lb 9.6 oz), SpO2 96.00%. General appearance: alert Resp: clear to auscultation bilaterally Neurologic: Grossly normal  Disposition:   Discharge Orders   Future Appointments Provider Department Dept Phone   05/02/2013 2:30 PM Kathrynn Ducking, MD [facility] [phone]   Future Orders Complete By Expires   Diet - low sodium heart healthy  As directed    Discharge wound care:  As directed    Comments:     Per wound care nursing   Increase activity slowly  As directed        Medication List         cephALEXin 500 MG capsule  Commonly known as:  KEFLEX  Take 1 capsule (500 mg total) by mouth 2 (two) times daily.     Co Q 10 10 MG Caps  Take 1 tablet by mouth every morning.     esomeprazole 40 MG packet  Commonly known as:  NEXIUM  Take 40 mg by mouth daily before breakfast.     fexofenadine 180 MG tablet  Commonly known as:  ALLEGRA  Take 180 mg by mouth daily.     fish oil-omega-3 fatty acids 1000 MG capsule  Take 1 g by mouth daily.     fluticasone 50 MCG/ACT nasal spray  Commonly known as:  FLONASE  Place 2 sprays into both nostrils at bedtime.     gabapentin 300 MG capsule  Commonly known as:  NEURONTIN  Take 300 mg by mouth at bedtime.     losartan-hydrochlorothiazide 50-12.5 MG per tablet  Commonly known as:  HYZAAR  Take 1 tablet by mouth daily.     multivitamin capsule  Take 1 capsule by mouth daily.     psyllium 58.6 % packet  Commonly known as:  METAMUCIL  Take 1 packet by mouth daily.     tamsulosin 0.4 MG Caps capsule  Commonly known as:  FLOMAX  Take 0.4 mg by mouth at bedtime.     traMADol 50 MG tablet  Commonly known as:  ULTRAM  Take 100 mg by mouth daily.     Vitamin D3 2000 UNITS capsule  Take 2,000 Units by mouth every other  day.           Follow-up Information   Follow up with Irven Shelling, MD In 6 days.   Specialty:  Internal Medicine   Contact information:   [address] [place] [address] [phone]       Signed: Wenda Low 05/01/2013, 8:15 AM

## 2013-05-01 NOTE — Progress Notes (Signed)
Occupational Therapy Evaluation Patient Details Name: Timothy Khan MRN: [id number] DOB: March 21, 1931 Today's Date: 05/01/2013 Time: 2376-2831 OT Time Calculation (min): 26 min  OT Assessment / Plan / Recommendation History of present illness 80 y.o. male admitted to [facility] on 04/30/13 with fall- patient has history of neuropathy.  Dx with small SDH, skull fx, and scalp laceration.     Clinical Impression   PTA, pt lived with wife and was independent with all mobility and ADL and was very active. Completed education regarding home safety and reducing risk of falls. Agree with neuro oupt rec to further assess balance deficits.     OT Assessment  Patient does not need any further OT services    Follow Up Recommendations  No OT follow up;Supervision - Intermittent    Barriers to Discharge      Equipment Recommendations  None recommended by OT (pt has eqipment)    Recommendations for Other Services    Frequency       Precautions / Restrictions Precautions Precautions: Fall Precaution Comments: h/o falls due to peripherial neuropathy.    Pertinent Vitals/Pain no apparent distress     ADL  Equipment Used: Gait belt;Rolling walker Transfers/Ambulation Related to ADLs: mod I ADL Comments: PT overall Mod I with ADL. Educated pt/family on safety for home and during ADL to reduce risk of falls.Given handout on home safety. Instructed to use showerseat for bathing.    OT Diagnosis:    OT Problem List:   OT Treatment Interventions:     OT Goals(Current goals can be found in the care plan section) Acute Rehab OT Goals Patient Stated Goal: to go home, stop making "stupid mistakes" that make him fall OT Goal Formulation:  (eval only)  Visit Information  Last OT Received On: 05/01/13 Assistance Needed: +1 History of Present Illness: 80 y.o. male admitted to [facility] on 04/30/13 with fall- patient has history of neuropathy.  Dx with small SDH, skull fx, and scalp laceration.          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   Communication Communication: No difficulties         Vision/Perception Vision - History Baseline Vision: Wears glasses all the time Perception Perception: Within Functional Limits Praxis Praxis: Intact   Cognition  Cognition Arousal/Alertness: Awake/alert Behavior During Therapy: WFL for tasks assessed/performed Overall Cognitive Status: Within Functional Limits for tasks assessed    Extremity/Trunk Assessment Upper Extremity Assessment Upper Extremity Assessment: Overall WFL for tasks assessed Lower Extremity Assessment Lower Extremity Assessment: Overall WFL for tasks assessed Cervical / Trunk Assessment Cervical / Trunk Assessment: Normal     Mobility Bed Mobility Overal bed mobility: Independent Transfers Overall transfer level: Modified independent Equipment used: Rolling walker (2 wheeled) General transfer comment: verbal cues for safe hand placement     Exercise     [facility], OTR/L  [phone] 2/12/2015Balance Balance Overall balance assessment: History of Falls;Needs assistance Sitting-balance support: Feet supported Sitting balance-Leahy Scale: Good Standing balance support: No upper extremity supported Standing balance-Leahy Scale:  Fair General Comments General comments (skin integrity, edema, etc.): stitches post head.    End of Session OT - End of  Session Equipment Utilized During Treatment: Gait belt;Rolling walker Activity Tolerance: Patient tolerated treatment well Patient left: in bed;with call bell/phone within reach;with family/visitor present Nurse Communication: Mobility status  GO Functional Assessment Tool Used: clinical judgement Functional Limitation: Self care Self Care Current Status (O2423): At least 1 percent but less than 20 percent impaired, limited or restricted Self Care Goal Status (N3614): At least 1 percent but less than 20 percent impaired, limited or restricted Self Care Discharge Status [phone]): At least 1 percent but less than 20 percent impaired, limited or restricted   [facility] 05/01/2013, 11:47 AM

## 2013-05-02 ENCOUNTER — Ambulatory Visit (INDEPENDENT_AMBULATORY_CARE_PROVIDER_SITE_OTHER): Payer: Medicare Other | Admitting: Neurology

## 2013-05-02 ENCOUNTER — Encounter: Payer: Self-pay | Admitting: Neurology

## 2013-05-02 VITALS — BP 162/83 | HR 88 | Wt 179.0 lb

## 2013-05-02 DIAGNOSIS — G609 Hereditary and idiopathic neuropathy, unspecified: Secondary | ICD-10-CM

## 2013-05-02 DIAGNOSIS — G629 Polyneuropathy, unspecified: Secondary | ICD-10-CM

## 2013-05-02 DIAGNOSIS — R269 Unspecified abnormalities of gait and mobility: Secondary | ICD-10-CM

## 2013-05-02 DIAGNOSIS — M21372 Foot drop, left foot: Secondary | ICD-10-CM | POA: Insufficient documentation

## 2013-05-02 DIAGNOSIS — M216X9 Other acquired deformities of unspecified foot: Secondary | ICD-10-CM

## 2013-05-02 DIAGNOSIS — M21371 Foot drop, right foot: Secondary | ICD-10-CM

## 2013-05-02 HISTORY — DX: Foot drop, right foot: M21.371

## 2013-05-02 NOTE — Patient Instructions (Signed)

## 2013-05-02 NOTE — Progress Notes (Signed)
Reason for visit: Peripheral neuropathy  Timothy Khan is an 80 y.o. male  History of present illness:  Timothy Khan is a 80 year old right-handed white male with a history of a peripheral neuropathy. The patient has a gait disorder associated with this, and he generally will ambulate without any assistive device. The patient was in the hospital recently, admitted on 04/30/2013. The patient fell and struck the back of his head. The patient indicates that he did not lose consciousness. CT scan of the head was done, showing a small parafalcine subdural hematoma frontally. The patient had a skull fracture associated with the fall. The patient has done fairly well since this time. The patient is now walking with a walker. The patient denies any new numbness or weakness on the face, arms, or legs. The patient does have some back pain and leg discomfort. The patient returns for an evaluation.  Past Medical History  Diagnosis Date  . Hypertension   . Hyperlipidemia   . Concussion 1950    Baseball injury  . Abnormal brain MRI     Right side  . Basal cell carcinoma of back   . Gastroesophageal reflux disease   . Peripheral neuropathy   . Gait disturbance   . BPH (benign prostatic hyperplasia)   . Lumbago   . Foot drop, bilateral 05/02/2013  . Skull fracture     History reviewed. No pertinent past surgical history.  Family History  Problem Relation Age of Onset  . Cancer Mother     Breast cancer  . Heart attack Father   . Heart disease Brother     Social history:  reports that he quit smoking about 28 years ago. He has never used smokeless tobacco. He reports that he does not drink alcohol or use illicit drugs.    Allergies  Allergen Reactions  . Codeine Nausea Only    Medications:  Current Outpatient Prescriptions on File Prior to Visit  Medication Sig Dispense Refill  . cephALEXin (KEFLEX) 500 MG capsule Take 1 capsule (500 mg total) by mouth 2 (two) times daily.   14 capsule  0  . Cholecalciferol (VITAMIN D3) 2000 UNITS capsule Take 2,000 Units by mouth every other day.       . Coenzyme Q10 (CO Q 10) 10 MG CAPS Take 1 tablet by mouth every morning.      Marland Kitchen esomeprazole (NEXIUM) 40 MG packet Take 40 mg by mouth daily before breakfast.      . fexofenadine (ALLEGRA) 180 MG tablet Take 180 mg by mouth daily.      . fish oil-omega-3 fatty acids 1000 MG capsule Take 1 g by mouth daily.       . fluticasone (FLONASE) 50 MCG/ACT nasal spray Place 2 sprays into both nostrils at bedtime.       . gabapentin (NEURONTIN) 300 MG capsule Take 300 mg by mouth at bedtime.      Marland Kitchen losartan-hydrochlorothiazide (HYZAAR) 50-12.5 MG per tablet Take 1 tablet by mouth daily.       . Multiple Vitamin (MULTIVITAMIN) capsule Take 1 capsule by mouth daily.      . psyllium (METAMUCIL) 58.6 % packet Take 1 packet by mouth daily.      . tamsulosin (FLOMAX) 0.4 MG CAPS Take 0.4 mg by mouth at bedtime.       . traMADol (ULTRAM) 50 MG tablet Take 100 mg by mouth daily.        No current facility-administered medications on file prior  to visit.    ROS:  Out of a complete 14 system review of symptoms, the patient complains only of the following symptoms, and all other reviewed systems are negative.  Sinus pressure Back pain, leg pain Gait instability  Blood pressure 162/83, pulse 88, weight 179 lb (81.194 kg).  Physical Exam  General: The patient is alert and cooperative at the time of the examination.  Skin: No significant peripheral edema is noted.   Neurologic Exam  Mental status: The patient is oriented x 3. Mini-Mental status examination done today shows a total score of 30/30.  Cranial nerves: Facial symmetry is present. Speech is normal, no aphasia or dysarthria is noted. Extraocular movements are full. Visual fields are full.  Motor: The patient has good strength in all 4 extremities, with exception that there are bilateral mild foot drops.  Sensory examination:  Soft touch sensation is decreased below the knees bilaterally, symmetric in the legs, arms, and face.  Coordination: The patient has good finger-nose-finger and heel-to-shin bilaterally.  Gait and station: The patient has a wide-based gait. The patient is walking with a walker today. Tandem gait is very unsteady. Romberg is positive, the patient falls forward. No drift is seen.  Reflexes: Deep tendon reflexes are symmetric, but are depressed.   Assessment/Plan:  One. Peripheral neuropathy  2. Gait disorder  3. Lumbosacral spine disease  4. History of fall, skull fracture, small subdural hematoma  5. Bilateral foot drops  The patient at this point will need use a cane or a walker when outside the house. The patient indicates that he does have to go down a flight of stairs in the home environment, and this could be a source of a significant fall. The patient may require a track system on the stairs. The patient will be referred for physical therapy for gait training. The patient has bilateral foot drops, and in the future he may require AFO braces. The patient will followup in 5 or 6 months.  Jill Alexanders MD 05/03/2013 11:59 AM  [facility] [address] [place], [place] [address]  Phone [phone] Fax [phone]

## 2013-05-06 ENCOUNTER — Telehealth: Payer: Self-pay | Admitting: Neurology

## 2013-05-06 DIAGNOSIS — S065XAA Traumatic subdural hemorrhage with loss of consciousness status unknown, initial encounter: Secondary | ICD-10-CM

## 2013-05-06 DIAGNOSIS — S065X9A Traumatic subdural hemorrhage with loss of consciousness of unspecified duration, initial encounter: Secondary | ICD-10-CM

## 2013-05-06 NOTE — Telephone Encounter (Signed)
I called patient. We will repeat the CT scan sometime later this week, the patient had a fall with head trauma, had a small subdural hematoma in the parafalcine area. This will be a followup study.

## 2013-05-08 ENCOUNTER — Ambulatory Visit
Admission: RE | Admit: 2013-05-08 | Discharge: 2013-05-08 | Disposition: A | Discharge status: Home / Self Care | Payer: Medicare Other | Source: Ambulatory Visit | Attending: Neurology | Admitting: Neurology

## 2013-05-08 ENCOUNTER — Telehealth: Payer: Self-pay | Admitting: Neurology

## 2013-05-08 DIAGNOSIS — S065X9A Traumatic subdural hemorrhage with loss of consciousness of unspecified duration, initial encounter: Secondary | ICD-10-CM

## 2013-05-08 DIAGNOSIS — S065XAA Traumatic subdural hemorrhage with loss of consciousness status unknown, initial encounter: Secondary | ICD-10-CM

## 2013-05-08 DIAGNOSIS — I619 Nontraumatic intracerebral hemorrhage, unspecified: Secondary | ICD-10-CM

## 2013-05-08 NOTE — Telephone Encounter (Signed)
Patient requesting a call back regarding setting up his therapy appointments. Please call to advise.

## 2013-05-08 NOTE — Telephone Encounter (Signed)
Patient is calling inquiring about his therapy appts being set up. The order is in and I informed the patient that someone would be contacting him concerning the appts. Please advise.

## 2013-05-09 ENCOUNTER — Telehealth: Payer: Self-pay | Admitting: Neurology

## 2013-05-09 NOTE — Telephone Encounter (Signed)
I called the patient. The CT scan of brain was unremarkable, resolution of the subdural hematoma was seen. I discussed this with the patient.   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 Overall no significant change compared with  previous CT scan dated 04/30/2013

## 2013-05-23 ENCOUNTER — Ambulatory Visit: Payer: Medicare Other | Attending: Neurology | Admitting: Physical Therapy

## 2013-05-23 DIAGNOSIS — M6281 Muscle weakness (generalized): Secondary | ICD-10-CM | POA: Insufficient documentation

## 2013-05-23 DIAGNOSIS — R269 Unspecified abnormalities of gait and mobility: Secondary | ICD-10-CM | POA: Insufficient documentation

## 2013-05-23 DIAGNOSIS — IMO0001 Reserved for inherently not codable concepts without codable children: Secondary | ICD-10-CM | POA: Insufficient documentation

## 2013-05-28 ENCOUNTER — Ambulatory Visit: Payer: Medicare Other | Admitting: Physical Therapy

## 2013-05-30 ENCOUNTER — Ambulatory Visit: Payer: Medicare Other | Admitting: Physical Therapy

## 2013-06-02 ENCOUNTER — Ambulatory Visit: Payer: Medicare Other | Admitting: Physical Therapy

## 2013-06-04 ENCOUNTER — Ambulatory Visit: Payer: Medicare Other | Admitting: Physical Therapy

## 2013-06-09 ENCOUNTER — Ambulatory Visit: Payer: Medicare Other | Admitting: Physical Therapy

## 2013-06-11 ENCOUNTER — Ambulatory Visit: Payer: Medicare Other | Admitting: Physical Therapy

## 2013-06-17 ENCOUNTER — Ambulatory Visit: Payer: Medicare Other | Admitting: Physical Therapy

## 2013-06-19 ENCOUNTER — Ambulatory Visit: Payer: Medicare Other | Attending: Neurology | Admitting: Physical Therapy

## 2013-06-19 DIAGNOSIS — IMO0001 Reserved for inherently not codable concepts without codable children: Secondary | ICD-10-CM | POA: Insufficient documentation

## 2013-06-19 DIAGNOSIS — M6281 Muscle weakness (generalized): Secondary | ICD-10-CM | POA: Insufficient documentation

## 2013-06-19 DIAGNOSIS — R269 Unspecified abnormalities of gait and mobility: Secondary | ICD-10-CM | POA: Insufficient documentation

## 2013-07-12 ENCOUNTER — Other Ambulatory Visit: Payer: Self-pay | Admitting: Neurology

## 2013-07-29 ENCOUNTER — Encounter: Payer: Self-pay | Admitting: Neurology

## 2013-10-30 ENCOUNTER — Ambulatory Visit: Payer: Medicare Other | Admitting: Neurology

## 2013-11-05 ENCOUNTER — Other Ambulatory Visit: Payer: Self-pay | Admitting: Neurology

## 2013-11-06 NOTE — Telephone Encounter (Signed)
Per OV 07/28

## 2013-12-09 ENCOUNTER — Encounter (INDEPENDENT_AMBULATORY_CARE_PROVIDER_SITE_OTHER): Payer: Self-pay

## 2013-12-09 ENCOUNTER — Encounter: Payer: Self-pay | Admitting: Neurology

## 2013-12-09 ENCOUNTER — Ambulatory Visit (INDEPENDENT_AMBULATORY_CARE_PROVIDER_SITE_OTHER): Payer: Medicare Other | Admitting: Neurology

## 2013-12-09 VITALS — BP 139/69 | HR 78 | Wt 181.0 lb

## 2013-12-09 DIAGNOSIS — G609 Hereditary and idiopathic neuropathy, unspecified: Secondary | ICD-10-CM

## 2013-12-09 DIAGNOSIS — M216X9 Other acquired deformities of unspecified foot: Secondary | ICD-10-CM

## 2013-12-09 DIAGNOSIS — M21371 Foot drop, right foot: Secondary | ICD-10-CM

## 2013-12-09 DIAGNOSIS — R269 Unspecified abnormalities of gait and mobility: Secondary | ICD-10-CM

## 2013-12-09 DIAGNOSIS — M21372 Foot drop, left foot: Principal | ICD-10-CM

## 2013-12-09 MED ORDER — GABAPENTIN 300 MG PO CAPS: 300.0000 mg | ORAL_CAPSULE | Freq: Two times a day (BID) | ORAL | Status: DC

## 2013-12-09 NOTE — Patient Instructions (Signed)

## 2013-12-09 NOTE — Progress Notes (Signed)
Reason for visit: Peripheral neuropathy  Timothy Khan is an 80 y.o. male  History of present illness:  Timothy Khan is a 80 year old right-handed white male with a history of a peripheral neuropathy. The patient has mild bilateral foot drops, with an associated gait disorder. The patient does continue to fall on occasion, oftentimes while turning. The patient had a head injury in over 2015 with a small subdural hematoma that gradually resolved. The patient continues to have some numbness in the legs, and some numbness in the hands at this point. He is also developing arthritis in the joints of the fingers. He does have some discomfort with his peripheral neuropathy, but he only takes 300 mg of gabapentin at night. He uses a walking stick when ambulating outside the house. The patient does have some low back pain, and he is able to improve the pain by putting pillows underneath his legs at nighttime. He returns to this office for an evaluation.  Past Medical History  Diagnosis Date  . Hypertension   . Hyperlipidemia   . Concussion 1950    Baseball injury  . Abnormal brain MRI     Right side  . Basal cell carcinoma of back   . Gastroesophageal reflux disease   . Peripheral neuropathy   . Gait disturbance   . BPH (benign prostatic hyperplasia)   . Lumbago   . Foot drop, bilateral 05/02/2013  . Skull fracture     Past Surgical History  Procedure Laterality Date  . Tonsillectomy    . Knee surgery Left   . Cataract extraction Bilateral   . Skin cancer resection      basal cell, [place] carcinoma  . Vasectomy      Family History  Problem Relation Age of Onset  . Cancer Mother     Breast cancer  . Heart attack Father   . Heart disease Brother     Social history:  reports that he quit smoking about 29 years ago. He has never used smokeless tobacco. He reports that he does not drink alcohol or use illicit drugs.    Allergies  Allergen Reactions  . Codeine Nausea Only      Medications:  Current Outpatient Prescriptions on File Prior to Visit  Medication Sig Dispense Refill  . Cholecalciferol (VITAMIN D3) 2000 UNITS capsule Take 2,000 Units by mouth every other day.       . Coenzyme Q10 (CO Q 10) 10 MG CAPS Take 1 tablet by mouth every morning.      Marland Kitchen esomeprazole (NEXIUM) 40 MG packet Take 40 mg by mouth daily before breakfast.      . fexofenadine (ALLEGRA) 180 MG tablet Take 180 mg by mouth daily.      . fish oil-omega-3 fatty acids 1000 MG capsule Take 1 g by mouth daily.       . fluticasone (FLONASE) 50 MCG/ACT nasal spray Place 2 sprays into both nostrils at bedtime.       . hydrochlorothiazide (MICROZIDE) 12.5 MG capsule Take 12.5 mg by mouth daily.      Marland Kitchen losartan-hydrochlorothiazide (HYZAAR) 50-12.5 MG per tablet Take 1 tablet by mouth daily.       . Multiple Vitamin (MULTIVITAMIN) capsule Take 1 capsule by mouth daily.      . psyllium (METAMUCIL) 58.6 % packet Take 1 packet by mouth daily.      . tamsulosin (FLOMAX) 0.4 MG CAPS Take 0.4 mg by mouth at bedtime.       Marland Kitchen  traMADol (ULTRAM) 50 MG tablet Take 100 mg by mouth daily.        No current facility-administered medications on file prior to visit.    ROS:  Out of a complete 14 system review of symptoms, the patient complains only of the following symptoms, and all other reviewed systems are negative.  Ringing in the ears Frequency of urination Joint pain, achy muscles, walking difficulties Skin wound, itching Numbness  Blood pressure 139/69, pulse 78, weight 181 lb (82.101 kg).  Physical Exam  General: The patient is alert and cooperative at the time of the examination.  Skin: No significant peripheral edema is noted.   Neurologic Exam  Mental status: The patient is oriented x 3. Mini-Mental status examination done today shows a total score of 30/30.  Cranial nerves: Facial symmetry is present. Speech is normal, no aphasia or dysarthria is noted. Extraocular movements are  full. Visual fields are full.  Motor: The patient has good strength in all 4 extremities, with exception that the patient has very mild bilateral foot drops.  Sensory examination: There is a stocking pattern pinprick sensory deficit to just below the knees bilaterally. The patient has symmetric sensation on the arms and face.  Coordination: The patient has good finger-nose-finger and heel-to-shin bilaterally.  Gait and station: The patient has a slightly wide-based, unsteady gait. The patient uses a walking stick for ambulation. Tandem gait is unsteady. Romberg is positive. No drift is seen.  Reflexes: Deep tendon reflexes are symmetric, but are depressed.   Assessment/Plan:  One. Peripheral neuropathy  2. Chronic low back pain  The patient will go up on the gabapentin for the discomfort in the back and for the pain secondary to the neuropathy. He will take 300 mg twice daily of gabapentin, and he is to call me if he requires a higher dose. Otherwise, he will followup in about 6 months.  Jill Alexanders MD 12/09/2013 7:59 PM  [facility] [address] [place], [place] [address]  Phone [phone] Fax [phone]

## 2014-02-04 ENCOUNTER — Encounter: Payer: Self-pay | Admitting: Neurology

## 2014-02-10 ENCOUNTER — Encounter: Payer: Self-pay | Admitting: Neurology

## 2014-06-09 ENCOUNTER — Ambulatory Visit (INDEPENDENT_AMBULATORY_CARE_PROVIDER_SITE_OTHER): Payer: Medicare Other | Admitting: Neurology

## 2014-06-09 ENCOUNTER — Encounter: Payer: Self-pay | Admitting: Neurology

## 2014-06-09 VITALS — BP 137/75 | HR 86 | Ht 70.0 in | Wt 182.4 lb

## 2014-06-09 DIAGNOSIS — M21372 Foot drop, left foot: Secondary | ICD-10-CM

## 2014-06-09 DIAGNOSIS — M21371 Foot drop, right foot: Secondary | ICD-10-CM | POA: Diagnosis not present

## 2014-06-09 DIAGNOSIS — G629 Polyneuropathy, unspecified: Secondary | ICD-10-CM | POA: Diagnosis not present

## 2014-06-09 DIAGNOSIS — R269 Unspecified abnormalities of gait and mobility: Secondary | ICD-10-CM

## 2014-06-09 MED ORDER — GABAPENTIN 300 MG PO CAPS: 300.0000 mg | ORAL_CAPSULE | Freq: Two times a day (BID) | ORAL | Status: DC

## 2014-06-09 NOTE — Patient Instructions (Signed)

## 2014-06-09 NOTE — Progress Notes (Signed)
Reason for visit: Peripheral neuropathy  Timothy Khan is an 81 y.o. male  History of present illness:  Timothy Khan is an 81 year old right-handed white male with a history of chronic low back pain and a history of a peripheral neuropathy associated with bilateral foot drops. The patient has been on low-dose gabapentin taking 300 mg twice daily. He is getting some drowsiness on the medication, but this is tolerable for him. The patient usually sleeps fairly well, but occasionally he may have some insomnia issues. The patient uses a walking stick for ambulation oftentimes when he is outside the house. The patient has had one fall within the last several weeks. He was in the bathtub, and he slipped and fell. The patient does not require the use of AFO braces. At times, when he is driving his car, he may feel as if he has difficulty determining how much pressure he is putting on the accelerator or break. The patient is currently satisfied with his level of pain. If he does a lot of yard work, he may have increased low back pain for several days afterwards. He returns for an evaluation.  Past Medical History  Diagnosis Date  . Hypertension   . Hyperlipidemia   . Concussion 1950    Baseball injury  . Abnormal brain MRI     Right side  . Basal cell carcinoma of back   . Gastroesophageal reflux disease   . Peripheral neuropathy   . Gait disturbance   . BPH (benign prostatic hyperplasia)   . Lumbago   . Foot drop, bilateral 05/02/2013  . Skull fracture     Past Surgical History  Procedure Laterality Date  . Tonsillectomy    . Knee surgery Left   . Cataract extraction Bilateral   . Skin cancer resection      basal cell, [place] carcinoma  . Vasectomy      Family History  Problem Relation Age of Onset  . Cancer Mother     Breast cancer  . Heart attack Father   . Heart disease Brother     Social history:  reports that he quit smoking about 29 years ago. He has never used  smokeless tobacco. He reports that he does not drink alcohol or use illicit drugs.    Allergies  Allergen Reactions  . Codeine Nausea Only    Medications:  Prior to Admission medications   Medication Sig Start Date End Date Taking? Authorizing Provider  Cholecalciferol (VITAMIN D3) 2000 UNITS capsule Take 2,000 Units by mouth every other day.    Yes Historical Provider, MD  Coenzyme Q10 (CO Q 10) 10 MG CAPS Take 1 tablet by mouth every morning.   Yes Historical Provider, MD  esomeprazole (NEXIUM) 40 MG packet Take 40 mg by mouth daily before breakfast.   Yes Historical Provider, MD  fexofenadine (ALLEGRA) 180 MG tablet Take 180 mg by mouth daily.   Yes Historical Provider, MD  fish oil-omega-3 fatty acids 1000 MG capsule Take 1 g by mouth daily.    Yes Historical Provider, MD  fluticasone (FLONASE) 50 MCG/ACT nasal spray Place 2 sprays into both nostrils at bedtime.  04/16/13  Yes Historical Provider, MD  gabapentin (NEURONTIN) 300 MG capsule Take 1 capsule (300 mg total) by mouth 2 (two) times daily. 06/09/14  Yes Kathrynn Ducking, MD  hydrochlorothiazide (MICROZIDE) 12.5 MG capsule Take 12.5 mg by mouth daily. 01/23/13  Yes Historical Provider, MD  losartan-hydrochlorothiazide (HYZAAR) 50-12.5 MG per tablet Take  1 tablet by mouth daily.  04/16/13  Yes Historical Provider, MD  Multiple Vitamin (MULTIVITAMIN) capsule Take 1 capsule by mouth daily.   Yes Historical Provider, MD  psyllium (METAMUCIL) 58.6 % packet Take 1 packet by mouth daily.   Yes Historical Provider, MD  tamsulosin (FLOMAX) 0.4 MG CAPS Take 0.4 mg by mouth at bedtime.    Yes Historical Provider, MD  traMADol (ULTRAM) 50 MG tablet Take 100 mg by mouth daily.  10/01/12  Yes Historical Provider, MD    ROS:  Out of a complete 14 system review of symptoms, the patient complains only of the following symptoms, and all other reviewed systems are negative.  Daytime sleepiness Numbness  Blood pressure 137/75, pulse 86, height 5'  10" (1.778 m), weight 182 lb 6.4 oz (82.736 kg).  Physical Exam  General: The patient is alert and cooperative at the time of the examination.  Skin: No significant peripheral edema is noted.   Neurologic Exam  Mental status: The patient is alert and oriented x 3 at the time of the examination. The patient has apparent normal recent and remote memory, with an apparently normal attention span and concentration ability.   Cranial nerves: Facial symmetry is present. Speech is normal, no aphasia or dysarthria is noted. Extraocular movements are full. Visual fields are full.  Motor: The patient has good strength in all 4 extremities, with exception of mild bilateral foot drops.  Sensory examination: Soft touch sensation is symmetric on the face, arms, and legs. Sensation is decreased below the knees bilaterally.  Coordination: The patient has good finger-nose-finger and heel-to-shin bilaterally.  Gait and station: The patient has a slightly wide-based gait. Tandem gait is unsteady. Romberg is negative. No drift is seen.  Reflexes: Deep tendon reflexes are symmetric, but are depressed.   Assessment/Plan:  1. Peripheral neuropathy  2. Bilateral foot drops  3. Chronic low back pain  4. Gait disorder  The patient overall is doing fairly well with his pain level. He is getting some drowsiness from the use of gabapentin, we may consider cutting it back to 300 mg at night. The patient is trying to be careful concerning his safety while walking. A prescription was called in for the gabapentin. He will follow-up in about 8 months.  Jill Alexanders MD 06/09/2014 8:14 PM  [facility] [address] [place], [place] [address]  Phone [phone] Fax [phone]

## 2015-01-03 ENCOUNTER — Other Ambulatory Visit: Payer: Self-pay | Admitting: Neurology

## 2015-02-02 ENCOUNTER — Other Ambulatory Visit: Payer: Self-pay | Admitting: Neurology

## 2015-02-27 ENCOUNTER — Other Ambulatory Visit: Payer: Self-pay | Admitting: Neurology

## 2015-03-06 ENCOUNTER — Other Ambulatory Visit: Payer: Self-pay | Admitting: Neurology

## 2015-03-09 ENCOUNTER — Ambulatory Visit (INDEPENDENT_AMBULATORY_CARE_PROVIDER_SITE_OTHER): Payer: Medicare Other | Admitting: Adult Health

## 2015-03-09 ENCOUNTER — Encounter: Payer: Self-pay | Admitting: Adult Health

## 2015-03-09 VITALS — BP 143/84 | HR 77 | Ht 70.0 in | Wt 180.0 lb

## 2015-03-09 DIAGNOSIS — G609 Hereditary and idiopathic neuropathy, unspecified: Secondary | ICD-10-CM | POA: Diagnosis not present

## 2015-03-09 DIAGNOSIS — M545 Low back pain, unspecified: Secondary | ICD-10-CM

## 2015-03-09 DIAGNOSIS — M21372 Foot drop, left foot: Secondary | ICD-10-CM

## 2015-03-09 DIAGNOSIS — G8929 Other chronic pain: Secondary | ICD-10-CM | POA: Diagnosis not present

## 2015-03-09 DIAGNOSIS — M21371 Foot drop, right foot: Secondary | ICD-10-CM | POA: Diagnosis not present

## 2015-03-09 NOTE — Progress Notes (Signed)
I have read the note, and I agree with the clinical assessment and plan.  Timothy Khan   

## 2015-03-09 NOTE — Progress Notes (Signed)
PATIENT: Timothy Khan DOB: 1931/11/26  REASON FOR VISIT: follow up- chronic low back pain, peripheral neuropathy, foot drops HISTORY FROM: patient  HISTORY OF PRESENT ILLNESS: Timothy Khan is an 81 year old male with a history of chronic low back pain, peripheral neuropathy associated with bilateral foot drops. He returns today for follow-up. He continues to take gabapentin 300 mg twice a day. He reports that on Sundays and Tuesdays he forgoes the morning dose due to drowsiness. On these days he goes to church and has a bible study. The patient states that when he forgoes these doses he does notice any changes in the burning or tingling in his feet or legs. He states that currently he does not experience any burning or tingling in the lower extremities. The patient states that his balance continues to worsen. He uses a walking stick occasionally. He states that his latest fall was when he was trying to pull up a shrub. Fortunately he did not sustain any injuries. He denies any new neurological symptoms. He returns today for an evaluation.  HISTORY 06/09/14 (Timothy Khan): Timothy Khan is an 81 year old right-handed white male with a history of chronic low back pain and a history of a peripheral neuropathy associated with bilateral foot drops. The patient has been on low-dose gabapentin taking 300 mg twice daily. He is getting some drowsiness on the medication, but this is tolerable for him. The patient usually sleeps fairly well, but occasionally he may have some insomnia issues. The patient uses a walking stick for ambulation oftentimes when he is outside the house. The patient has had one fall within the last several weeks. He was in the bathtub, and he slipped and fell. The patient does not require the use of AFO braces. At times, when he is driving his car, he may feel as if he has difficulty determining how much pressure he is putting on the accelerator or break. The patient is currently  satisfied with his level of pain. If he does a lot of yard work, he may have increased low back pain for several days afterwards. He returns for an evaluation.  REVIEW OF SYSTEMS: Out of a complete 14 system review of symptoms, the patient complains only of the following symptoms, and all other reviewed systems are negative.  Light sensitivity, cough, wheezing, ringing in ears, runny, wounds, itching  ALLERGIES: Allergies  Allergen Reactions  . Codeine Nausea Only    HOME MEDICATIONS: Outpatient Prescriptions Prior to Visit  Medication Sig Dispense Refill  . Cholecalciferol (VITAMIN D3) 2000 UNITS capsule Take 2,000 Units by mouth every other day.     . Coenzyme Q10 (CO Q 10) 10 MG CAPS Take 1 tablet by mouth every morning.    . fexofenadine (ALLEGRA) 180 MG tablet Take 180 mg by mouth daily.    . fish oil-omega-3 fatty acids 1000 MG capsule Take 1 g by mouth daily.     . fluticasone (FLONASE) 50 MCG/ACT nasal spray Place 2 sprays into both nostrils at bedtime.     . gabapentin (NEURONTIN) 300 MG capsule TAKE 1 CAPSULE (300 MG TOTAL) BY MOUTH 2 (TWO) TIMES DAILY. 60 capsule 0  . hydrochlorothiazide (MICROZIDE) 12.5 MG capsule Take 12.5 mg by mouth daily.    Marland Kitchen losartan-hydrochlorothiazide (HYZAAR) 50-12.5 MG per tablet Take 1 tablet by mouth daily.     . Multiple Vitamin (MULTIVITAMIN) capsule Take 1 capsule by mouth daily.    . psyllium (METAMUCIL) 58.6 % packet Take 1 packet by mouth daily.    Marland Kitchen  tamsulosin (FLOMAX) 0.4 MG CAPS Take 0.4 mg by mouth at bedtime.     . traMADol (ULTRAM) 50 MG tablet Take 100 mg by mouth daily.     Marland Kitchen esomeprazole (NEXIUM) 40 MG packet Take 40 mg by mouth daily before breakfast.     No facility-administered medications prior to visit.    PAST MEDICAL HISTORY: Past Medical History  Diagnosis Date  . Hypertension   . Hyperlipidemia   . Concussion 1950    Baseball injury  . Abnormal brain MRI     Right side  . Basal cell carcinoma of back   .  Gastroesophageal reflux disease   . Peripheral neuropathy ([place])   . Gait disturbance   . BPH (benign prostatic hyperplasia)   . Lumbago   . Foot drop, bilateral 05/02/2013  . Skull fracture ([place])     PAST SURGICAL HISTORY: Past Surgical History  Procedure Laterality Date  . Tonsillectomy    . Knee surgery Left   . Cataract extraction Bilateral   . Skin cancer resection      basal cell, [place] carcinoma  . Vasectomy      FAMILY HISTORY: Family History  Problem Relation Age of Onset  . Cancer Mother     Breast cancer  . Heart attack Father   . Heart disease Brother     SOCIAL HISTORY: Social History   Social History  . Marital Status: Married    Spouse Name: N/A  . Number of Children: 4  . Years of Education: 18   Occupational History  . Retired    Social History Main Topics  . Smoking status: Former Smoker    Quit date: 08/18/1984  . Smokeless tobacco: Never Used  . Alcohol Use: No  . Drug Use: No  . Sexual Activity: Not on file   Other Topics Concern  . Not on file   Social History Narrative   Patient states he is ambidextrous but is predominantly left handed.   Patient drinks 3 cups of coffee per day.      PHYSICAL EXAM  Filed Vitals:   03/09/15 1325  BP: 143/84  Pulse: 77  Height: 5\' 10"  (1.778 m)  Weight: 180 lb (81.647 kg)   Body mass index is 25.83 kg/(m^2).  Generalized: Well developed, in no acute distress   Neurological examination  Mentation: Alert oriented to time, place, history taking. Follows all commands speech and language fluent Cranial nerve II-XII: Pupils were equal round reactive to light. Extraocular movements were full, visual field were full on confrontational test. Facial sensation and strength were normal. Uvula tongue midline. Head turning and shoulder shrug  were normal and symmetric. Motor: The motor testing reveals 5 over 5 strength of all 4 extremities. Good symmetric motor tone is noted throughout.  Sensory: Sensory  testing is intact to soft touch on all 4 extremities. Trace pinprick sensation in the lower extremities in a stocking pattern. Vibration sensation and position sense decreased in the lower extremities. No wounds on the feet.No evidence of extinction is noted.  Coordination: Cerebellar testing reveals good finger-nose-finger and heel-to-shin bilaterally.  Gait and station: Gait is slightly wide-based. Tandem gait is unsteady. Romberg is positive.  Reflexes: Deep tendon reflexes are symmetric but depressed throughout  DIAGNOSTIC DATA (LABS, IMAGING, TESTING) - I reviewed patient records, labs, notes, testing and imaging myself where available.    ASSESSMENT AND PLAN 81 y.o. year old male  has a past medical history of Hypertension; Hyperlipidemia; Concussion (1950); Abnormal brain  MRI; Basal cell carcinoma of back; Gastroesophageal reflux disease; Peripheral neuropathy ([place]); Gait disturbance; BPH (benign prostatic hyperplasia); Lumbago; Foot drop, bilateral (05/02/2013); and Skull fracture ([place]). here with:  1. Peripheral neuropathy 2. Bilateral foot drop 3. Chronic low back pain  Overall the patient is doing well. He has not noticed any change in his discomfort when eliminating dose of gabapentin. I have advised the patient to try eliminating the morning dose of gabapentin. If his discomfort returns he can restart this. Patient is amenable to this. I also encouraged the patient to use his walking stick as much as possible to prevent falls. Patient verbalized understanding. Patient advised that if his symptoms worsen or he develops any new symptoms he should let us know. He will follow-up in 6 months or sooner if needed.  Ward Givens, MSN, NP-C 03/09/2015, 1:41 PM [facility] [address] [place], [place] [address] [phone]

## 2015-03-09 NOTE — Patient Instructions (Signed)
Eliminate the morning dose of gabapentin  Try using the walking stick more often If your symptoms worsen or you develop new symptoms please let us know.

## 2015-05-10 ENCOUNTER — Other Ambulatory Visit: Payer: Self-pay | Admitting: Neurology

## 2015-09-06 ENCOUNTER — Encounter: Payer: Self-pay | Admitting: Adult Health

## 2015-09-06 ENCOUNTER — Ambulatory Visit (INDEPENDENT_AMBULATORY_CARE_PROVIDER_SITE_OTHER): Payer: Medicare Other | Admitting: Adult Health

## 2015-09-06 VITALS — BP 126/64 | HR 80 | Ht 70.0 in | Wt 175.4 lb

## 2015-09-06 DIAGNOSIS — G609 Hereditary and idiopathic neuropathy, unspecified: Secondary | ICD-10-CM

## 2015-09-06 DIAGNOSIS — R269 Unspecified abnormalities of gait and mobility: Secondary | ICD-10-CM

## 2015-09-06 DIAGNOSIS — M21372 Foot drop, left foot: Secondary | ICD-10-CM | POA: Diagnosis not present

## 2015-09-06 DIAGNOSIS — M21371 Foot drop, right foot: Secondary | ICD-10-CM | POA: Diagnosis not present

## 2015-09-06 MED ORDER — GABAPENTIN 300 MG PO CAPS: 300.0000 mg | ORAL_CAPSULE | Freq: Every day | ORAL | Status: DC

## 2015-09-06 NOTE — Patient Instructions (Signed)
Can try eliminating night time dose of gabapentin- If pain returns- you can restart If your symptoms worsen or you develop new symptoms please let us know.

## 2015-09-06 NOTE — Progress Notes (Signed)
I have read the note, and I agree with the clinical assessment and plan.  Dieter Hane KEITH   

## 2015-09-06 NOTE — Progress Notes (Signed)
PATIENT: Timothy Khan DOB: [birth year]  REASON FOR VISIT: follow up- neuropathy HISTORY FROM: patient  HISTORY OF PRESENT ILLNESS: Timothy Khan is an 82 year old male with a history of chronic low back pain, peripheral neuropathy associated with bilateral foot drops. He returns today for follow-up. The patient states that he is only taking gabapentin at night. He is not noticing any change in his discomfort. He denies any sharp shooting pains in the leg. He states that he primarily just has numbness in the lower extremities and now in the hands. He states every once in a while he'll have some tingling sensations in the toes. He uses a "walking stick" when ambulating. He states that he does have frequent falls. The patient participated in physical therapy approximately 2 years ago. He states that he is not interested in physical therapy or AFO braces. He returns today for an evaluation.  HISTORY 03/09/15: Timothy Khan is an 82 year old male with a history of chronic low back pain, peripheral neuropathy associated with bilateral foot drops. He returns today for follow-up. He continues to take gabapentin 300 mg twice a day. He reports that on Sundays and Tuesdays he forgoes the morning dose due to drowsiness. On these days he goes to church and has a bible study. The patient states that when he forgoes these doses he does notice any changes in the burning or tingling in his feet or legs. He states that currently he does not experience any burning or tingling in the lower extremities. The patient states that his balance continues to worsen. He uses a walking stick occasionally. He states that his latest fall was when he was trying to pull up a shrub. Fortunately he did not sustain any injuries. He denies any new neurological symptoms. He returns today for an evaluation.  HISTORY 06/09/14 (WILLIS): Timothy Khan is an 82 year old right-handed white male with a history of chronic low back pain and  a history of a peripheral neuropathy associated with bilateral foot drops. The patient has been on low-dose gabapentin taking 300 mg twice daily. He is getting some drowsiness on the medication, but this is tolerable for him. The patient usually sleeps fairly well, but occasionally he may have some insomnia issues. The patient uses a walking stick for ambulation oftentimes when he is outside the house. The patient has had one fall within the last several weeks. He was in the bathtub, and he slipped and fell. The patient does not require the use of AFO braces. At times, when he is driving his car, he may feel as if he has difficulty determining how much pressure he is putting on the accelerator or break. The patient is currently satisfied with his level of pain. If he does a lot of yard work, he may have increased low back pain for several days afterwards. He returns for an evaluation.  REVIEW OF SYSTEMS: Out of a complete 14 system review of symptoms, the patient complains only of the following symptoms, and all other reviewed systems are negative.  Runny nose, cough, frequency of urination, numbness, itching  ALLERGIES: Allergies  Allergen Reactions  . Codeine Nausea Only    HOME MEDICATIONS: Outpatient Prescriptions Prior to Visit  Medication Sig Dispense Refill  . Cholecalciferol (VITAMIN D3) 2000 UNITS capsule Take 2,000 Units by mouth every other day.     . Coenzyme Q10 (CO Q 10) 10 MG CAPS Take 1 tablet by mouth every morning.    . Esomeprazole Magnesium (NEXIUM PO) Take 22.3  mg by mouth daily.    . fexofenadine (ALLEGRA) 180 MG tablet Take 180 mg by mouth daily.    . fish oil-omega-3 fatty acids 1000 MG capsule Take 1 g by mouth daily.     . fluticasone (FLONASE) 50 MCG/ACT nasal spray Place 2 sprays into both nostrils at bedtime.     . gabapentin (NEURONTIN) 300 MG capsule TAKE 1 CAPSULE (300 MG TOTAL) BY MOUTH 2 (TWO) TIMES DAILY. 60 capsule 11  . losartan-hydrochlorothiazide (HYZAAR)  50-12.5 MG per tablet Take 1 tablet by mouth daily.     . Multiple Vitamin (MULTIVITAMIN) capsule Take 1 capsule by mouth daily.    . psyllium (METAMUCIL) 58.6 % packet Take 1 packet by mouth daily.    . tamsulosin (FLOMAX) 0.4 MG CAPS Take 0.4 mg by mouth at bedtime.     . traMADol (ULTRAM) 50 MG tablet Take 100 mg by mouth daily.     . hydrochlorothiazide (MICROZIDE) 12.5 MG capsule Take 12.5 mg by mouth daily.     No facility-administered medications prior to visit.    PAST MEDICAL HISTORY: Past Medical History  Diagnosis Date  . Hypertension   . Hyperlipidemia   . Concussion 1950    Baseball injury  . Abnormal brain MRI     Right side  . Basal cell carcinoma of back   . Gastroesophageal reflux disease   . Peripheral neuropathy ([place])   . Gait disturbance   . BPH (benign prostatic hyperplasia)   . Lumbago   . Foot drop, bilateral 05/02/2013  . Skull fracture ([place])     PAST SURGICAL HISTORY: Past Surgical History  Procedure Laterality Date  . Tonsillectomy    . Knee surgery Left   . Cataract extraction Bilateral   . Skin cancer resection      basal cell, [place] carcinoma  . Vasectomy      FAMILY HISTORY: Family History  Problem Relation Age of Onset  . Cancer Mother     Breast cancer  . Heart attack Father   . Heart disease Brother     SOCIAL HISTORY: Social History   Social History  . Marital Status: Married    Spouse Name: N/A  . Number of Children: 4  . Years of Education: 18   Occupational History  . Retired    Social History Main Topics  . Smoking status: Former Smoker    Quit date: 08/18/1984  . Smokeless tobacco: Never Used  . Alcohol Use: No  . Drug Use: No  . Sexual Activity: Not on file   Other Topics Concern  . Not on file   Social History Narrative   Patient states he is ambidextrous but is predominantly left handed.   Patient drinks 3 cups of coffee per day.      PHYSICAL EXAM  Filed Vitals:   09/06/15 1246  BP: 126/64    Pulse: 80  Height: 5\' 10"  (1.778 m)  Weight: 175 lb 6.4 oz (79.561 kg)   Body mass index is 25.17 kg/(m^2).  Generalized: Well developed, in no acute distress   Neurological examination  Mentation: Alert oriented to time, place, history taking. Follows all commands speech and language fluent Cranial nerve II-XII: Pupils were equal round reactive to light. Extraocular movements were full, visual field were full on confrontational test. Facial sensation and strength were normal. Uvula tongue midline. Head turning and shoulder shrug  were normal and symmetric. Motor: The motor testing reveals 5 over 5 strength of all 4 extremities.  Good symmetric motor tone is noted throughout. Mild bilateral foot drops Sensory: Sensory testing is intact to soft touch on all 4 extremities. No evidence of extinction is noted.  Coordination: Cerebellar testing reveals good finger-nose-finger and heel-to-shin bilaterally.  Gait and station: Gait is wide-based. Slightly unsteady. Tandem gait not attempted. Romberg is negative but unsteady Reflexes: Deep tendon reflexes are symmetric but depressed throughout  DIAGNOSTIC DATA (LABS, IMAGING, TESTING) - I reviewed patient records, labs, notes, testing and imaging myself where available.   ASSESSMENT AND PLAN 82 y.o. year old male  has a past medical history of Hypertension; Hyperlipidemia; Concussion (1950); Abnormal brain MRI; Basal cell carcinoma of back; Gastroesophageal reflux disease; Peripheral neuropathy ([place]); Gait disturbance; BPH (benign prostatic hyperplasia); Lumbago; Foot drop, bilateral (05/02/2013); and Skull fracture ([place]). here with:  1. Peripheral Neuropathy 2. Abnormality of gait 3. Bilateral foot drop  The patient does not have any significant discomfort with his neuropathy. He will discontinue his nighttime dose of gabapentin. I have advised him that if he develops any significant discomfort he can restart this medication. The patient defers  on physical therapy at this time. He also does not feel that he would use AFO braces. I encouraged him to be very cautious with his ambulation to avoid falls. He voiced understanding. He will follow-up in 6 months or sooner if needed.   Ward Givens, MSN, NP-C 09/06/2015, 1:09 PM [facility] [address] [place], [place] [address] [phone]

## 2016-03-06 ENCOUNTER — Ambulatory Visit: Payer: Medicare Other | Admitting: Adult Health

## 2016-04-03 ENCOUNTER — Ambulatory Visit (INDEPENDENT_AMBULATORY_CARE_PROVIDER_SITE_OTHER): Payer: Medicare Other | Admitting: Adult Health

## 2016-04-03 ENCOUNTER — Encounter: Payer: Self-pay | Admitting: Adult Health

## 2016-04-03 VITALS — BP 130/69 | HR 90 | Ht 70.0 in | Wt 176.2 lb

## 2016-04-03 DIAGNOSIS — R296 Repeated falls: Secondary | ICD-10-CM | POA: Diagnosis not present

## 2016-04-03 DIAGNOSIS — R269 Unspecified abnormalities of gait and mobility: Secondary | ICD-10-CM | POA: Diagnosis not present

## 2016-04-03 DIAGNOSIS — G609 Hereditary and idiopathic neuropathy, unspecified: Secondary | ICD-10-CM | POA: Diagnosis not present

## 2016-04-03 NOTE — Progress Notes (Signed)
I have read the note, and I agree with the clinical assessment and plan.  Zeek Rostron KEITH   

## 2016-04-03 NOTE — Progress Notes (Signed)
PATIENT: Timothy Khan DOB: [birth year]  REASON FOR VISIT: follow up- peripheral neuropathy HISTORY FROM: patient  HISTORY OF PRESENT ILLNESS: Today 04/03/2016: Timothy Khan is an 83 year old male with a history of chronic low back pain and peripheral neuropathy. He returns today for follow-up. At the last visit the patient was advised that he could discontinue gabapentin if he did not find it beneficial. The patient states that he did not try this. He states that he does not have any significant pain. He states occasionally he may have pain in one of his toes but it is very infrequent. The patient continues to have difficulty ambulating. He does not wear AFO braces. He does use a walking stick. He knows that he has the most trouble with turns or changing directions. He does have frequent falls. Fortunately he is not suffering any significant injuries. He reports that he primarily has numbness in the feet also reporting more numbness in the hands. He returns today for an evaluation.  HISTORY 09/06/15: Timothy Khan is an 83 year old male with a history of chronic low back pain, peripheral neuropathy associated with bilateral foot drops. He returns today for follow-up. The patient states that he is only taking gabapentin at night. He is not noticing any change in his discomfort. He denies any sharp shooting pains in the leg. He states that he primarily just has numbness in the lower extremities and now in the hands. He states every once in a while he'll have some tingling sensations in the toes. He uses a "walking stick" when ambulating. He states that he does have frequent falls. The patient participated in physical therapy approximately 2 years ago. He states that he is not interested in physical therapy or AFO braces. He returns today for an evaluation.  HISTORY 03/09/15: Timothy Khan is an 83 year old male with a history of chronic low back pain, peripheral neuropathy associated with  bilateral foot drops. He returns today for follow-up. He continues to take gabapentin 300 mg twice a day. He reports that on Sundays and Tuesdays he forgoes the morning dose due to drowsiness. On these days he goes to church and has a bible study. The patient states that when he forgoes these doses he does notice any changes in the burning or tingling in his feet or legs. He states that currently he does not experience any burning or tingling in the lower extremities. The patient states that his balance continues to worsen. He uses a walking stick occasionally. He states that his latest fall was when he was trying to pull up a shrub. Fortunately he did not sustain any injuries. He denies any new neurological symptoms. He returns today for an evaluation.  HISTORY 06/09/14 (WILLIS): Timothy Khan is an 83 year old right-handed white male with a history of chronic low back pain and a history of a peripheral neuropathy associated with bilateral foot drops. The patient has been on low-dose gabapentin taking 300 mg twice daily. He is getting some drowsiness on the medication, but this is tolerable for him. The patient usually sleeps fairly well, but occasionally he may have some insomnia issues. The patient uses a walking stick for ambulation oftentimes when he is outside the house. The patient has had one fall within the last several weeks. He was in the bathtub, and he slipped and fell. The patient does not require the use of AFO braces. At times, when he is driving his car, he may feel as if he has difficulty determining how much  pressure he is putting on the accelerator or break. The patient is currently satisfied with his level of pain. If he does a lot of yard work, he may have increased low back pain for several days afterwards. He returns for an evaluation.   REVIEW OF SYSTEMS: Out of a complete 14 system review of symptoms, the patient complains only of the following symptoms, and all other reviewed systems  are negative.  Numbness, environmental allergies, itching   ALLERGIES: Allergies  Allergen Reactions  . Codeine Nausea Only    HOME MEDICATIONS: Outpatient Medications Prior to Visit  Medication Sig Dispense Refill  . Cholecalciferol (VITAMIN D3) 2000 UNITS capsule Take 2,000 Units by mouth every other day.     . Coenzyme Q10 (CO Q 10) 10 MG CAPS Take 1 tablet by mouth every morning.    . Esomeprazole Magnesium (NEXIUM PO) Take 22.3 mg by mouth daily.    . fexofenadine (ALLEGRA) 180 MG tablet Take 180 mg by mouth daily.    . fish oil-omega-3 fatty acids 1000 MG capsule Take 1 g by mouth daily.     . fluticasone (FLONASE) 50 MCG/ACT nasal spray Place 2 sprays into both nostrils at bedtime.     . gabapentin (NEURONTIN) 300 MG capsule Take 1 capsule (300 mg total) by mouth at bedtime. 60 capsule 11  . losartan-hydrochlorothiazide (HYZAAR) 50-12.5 MG per tablet Take 1 tablet by mouth daily.     . Multiple Vitamin (MULTIVITAMIN) capsule Take 1 capsule by mouth daily.    . psyllium (METAMUCIL) 58.6 % packet Take 1 packet by mouth daily.    . tamsulosin (FLOMAX) 0.4 MG CAPS Take 0.4 mg by mouth at bedtime.     . traMADol (ULTRAM) 50 MG tablet Take 100 mg by mouth daily.     Marland Kitchen UNABLE TO FIND Med Name:probiotic 50billion 1 cap daily     No facility-administered medications prior to visit.     PAST MEDICAL HISTORY: Past Medical History:  Diagnosis Date  . Abnormal brain MRI    Right side  . Basal cell carcinoma of back   . BPH (benign prostatic hyperplasia)   . Concussion 1950   Baseball injury  . Foot drop, bilateral 05/02/2013  . Gait disturbance   . Gastroesophageal reflux disease   . Hyperlipidemia   . Hypertension   . Lumbago   . Peripheral neuropathy ([place])   . Skull fracture ([place])     PAST SURGICAL HISTORY: Past Surgical History:  Procedure Laterality Date  . CATARACT EXTRACTION Bilateral   . KNEE SURGERY Left   . skin cancer resection     basal cell, [place] carcinoma    . TONSILLECTOMY    . VASECTOMY      FAMILY HISTORY: Family History  Problem Relation Age of Onset  . Cancer Mother     Breast cancer  . Heart attack Father   . Heart disease Brother     SOCIAL HISTORY: Social History   Social History  . Marital status: Married    Spouse name: N/A  . Number of children: 4  . Years of education: 23   Occupational History  . Retired    Social History Main Topics  . Smoking status: Former Smoker    Quit date: 08/18/1984  . Smokeless tobacco: Never Used  . Alcohol use No  . Drug use: No  . Sexual activity: Not on file   Other Topics Concern  . Not on file   Social History Narrative  Patient states he is ambidextrous but is predominantly left handed.   Patient drinks 3 cups of coffee per day.      PHYSICAL EXAM  Vitals:   04/03/16 0826  BP: 130/69  Pulse: 90  Weight: 176 lb 3.2 oz (79.9 kg)  Height: 5\' 10"  (1.778 m)   Body mass index is 25.28 kg/m.  Generalized: Well developed, in no acute distress   Neurological examination  Mentation: Alert oriented to time, place, history taking. Follows all commands speech and language fluent Cranial nerve II-XII: Pupils were equal round reactive to light. Extraocular movements were full, visual field were full on confrontational test. Facial sensation and strength were normal. Uvula tongue midline. Head turning and shoulder shrug  were normal and symmetric. Motor: The motor testing reveals 5 over 5 strength of all 4 extremities. Good symmetric motor tone is noted throughout. Bilateral foot drops. Sensory: Sensory testing is intact to soft touch on all 4 extremities. No evidence of extinction is noted.  Coordination: Cerebellar testing reveals good finger-nose-finger and heel-to-shin bilaterally.  Gait and station: Patient has a wide-based gait. Gait is unsteady. Tandem gait not attempted. Romberg is unsteady. Reflexes: Deep tendon reflexes are symmetric and normal bilaterally.    DIAGNOSTIC DATA (LABS, IMAGING, TESTING) - I reviewed patient records, labs, notes, testing and imaging myself where available.      ASSESSMENT AND PLAN 83 y.o. year old male  has a past medical history of Abnormal brain MRI; Basal cell carcinoma of back; BPH (benign prostatic hyperplasia); Concussion (1950); Foot drop, bilateral (05/02/2013); Gait disturbance; Gastroesophageal reflux disease; Hyperlipidemia; Hypertension; Lumbago; Peripheral neuropathy ([place]); and Skull fracture ([place]). here with:  1. Peripheral neuropathy  Patient again advised that he can discontinue his nighttime dose of gabapentin. If his pain returns he can certainly restart this. The patient is now amenable to trying physical therapy. I will put a referral in for gait and balance training. Patient advised that if his symptoms worsen or he develops new symptoms he should let us know. He will follow-up in 7-8 months with Dr. Jannifer Franklin.     Ward Givens, MSN, NP-C 04/03/2016, 8:14 AM [facility] [address] [place], [place] [address] [phone]

## 2016-04-03 NOTE — Patient Instructions (Signed)
Continue gabapentin at night- may consider discontinuing if no pain Referral to Physical therapy If your symptoms worsen or you develop new symptoms please let us know.

## 2016-04-04 ENCOUNTER — Ambulatory Visit: Payer: 59 | Attending: Adult Health | Admitting: Physical Therapy

## 2016-04-04 DIAGNOSIS — R2689 Other abnormalities of gait and mobility: Secondary | ICD-10-CM | POA: Insufficient documentation

## 2016-04-04 DIAGNOSIS — M6281 Muscle weakness (generalized): Secondary | ICD-10-CM | POA: Diagnosis present

## 2016-04-04 DIAGNOSIS — R296 Repeated falls: Secondary | ICD-10-CM | POA: Diagnosis present

## 2016-04-04 NOTE — Therapy (Signed)
[facility] [address] [place], [place], [address] Phone: [phone]   Fax:  [phone]  Physical Therapy Evaluation  Patient Details  Name: Timothy Khan MRN: [id number] Date of Birth: December 03, 1931 Referring Provider: Ward Givens, NP  Encounter Date: 04/04/2016      PT End of Session - 04/04/16 1611    Visit Number 1   Number of Visits 9   Date for PT Re-Evaluation 05/02/16   Authorization Type [employer] Medicare   PT Start Time [id number]   PT Stop Time 1607   PT Time Calculation (min) 37 min   Equipment Utilized During Treatment Gait belt   Activity Tolerance Patient tolerated treatment well   Behavior During Therapy [facility] for tasks assessed/performed      Past Medical History:  Diagnosis Date  . Abnormal brain MRI    Right side  . Basal cell carcinoma of back   . BPH (benign prostatic hyperplasia)   . Concussion 1950   Baseball injury  . Foot drop, bilateral 05/02/2013  . Gait disturbance   . Gastroesophageal reflux disease   . Hyperlipidemia   . Hypertension   . Lumbago   . Peripheral neuropathy ([place])   . Skull fracture [facility])     Past Surgical History:  Procedure Laterality Date  . CATARACT EXTRACTION Bilateral   . KNEE SURGERY Left   . skin cancer resection     basal cell, [place] carcinoma  . TONSILLECTOMY    . VASECTOMY      There were no vitals filed for this visit.       Subjective Assessment - 04/04/16 1532    Subjective Pt is an 83 y/o male who presents to OPPT for peripheral neuropathy affecting balance and mobility.  Pt reports he uses a cane for ambulation but forgot to bring today.  Pt also reports bil foot drop and NP concerned about need for AFO.     Pertinent History peripheral neuropathy, HTN, HLD, fall s/p SDH in Feb 2015    Limitations Walking   Patient Stated Goals improve balance, decrease falls   Currently in Pain? No/denies            [facility] PT Assessment - 04/04/16  1535      Assessment   Medical Diagnosis peripheral neuropathy   Referring Provider Ward Givens, NP   Onset Date/Surgical Date --  4-5 years   Next MD Visit Sept 2018   Prior Therapy in 2015 for same condition     Precautions   Precautions Fall     Restrictions   Weight Bearing Restrictions No     Balance Screen   Has the patient fallen in the past 6 months Yes   How many times? 6   Has the patient had a decrease in activity level because of a fear of falling?  No   Is the patient reluctant to leave their home because of a fear of falling?  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[place] (reports occasional falls)     Observation/Other Assessments   Focus on Therapeutic Outcomes (FOTO)  73 (27% limited; predicted 26% limited)   Activities of Balance Confidence Scale (ABC Scale)  61.3%     Sensation   Additional Comments stocking glove presentation: numbness from knees distally     Strength   Overall Strength Comments tested in sitting   Strength Assessment Site Hip;Knee;Ankle   Right Hip Flexion 4/5   Left Hip Flexion 4/5   Right/Left Knee Right;Left   Right Knee Flexion 5/5   Right Knee Extension 5/5   Left Knee Flexion 5/5   Left Knee Extension 5/5   Right Ankle Dorsiflexion 3/5   Left Ankle Dorsiflexion 3/5     Ambulation/Gait   Ambulation/Gait Yes   Ambulation/Gait  Assistance 5: Supervision   Ambulation Distance (Feet) 100 Feet   Assistive device None   Gait Pattern --  decreased push off bil; bil external rotation, bil foot slap   Gait velocity 3.77 ft/sec  8.69 sec     Standardized Balance Assessment   Standardized Balance Assessment Berg Balance Test;Timed Up and Go Test     Berg Balance Test   Sit to Stand Able to stand without using hands and stabilize independently   Standing Unsupported Able to stand safely 2 minutes   Sitting with Back Unsupported but Feet Supported on Floor or Stool Able to sit safely and securely 2 minutes   Stand to Sit Sits safely with minimal use of hands   Transfers Able to transfer safely, minor use of hands   Standing Unsupported with Eyes Closed Needs help to keep from falling   Standing Ubsupported with Feet Together Able to place feet together independently and stand for 1 minute with supervision   From Standing, Reach Forward with Outstretched Arm Can reach confidently >25 cm (10")   From Standing Position, Pick up Object from Floor Able to pick up shoe, needs supervision   From Standing Position, Turn to Look Behind Over each Shoulder Needs assist to keep from losing balance and falling   Turn 360 Degrees Needs close supervision or verbal cueing   Standing Unsupported, Alternately Place Feet on Step/Stool Able to complete 4 steps without aid or supervision   Standing Unsupported, One Foot in Front Able to plae foot ahead of the other independently and hold 30 seconds   Standing on One Leg Tries to lift leg/unable to hold 3 seconds but remains standing independently   Total Score 37     Timed Up and Go Test   Normal TUG (seconds) 11   TUG Comments pt very guarded with turns                           PT Education - 04/04/16 1611    Education provided Yes   Education Details clinical findings, POC, goals of care   Person(s) Educated Patient   Methods Explanation   Comprehension  Verbalized understanding             PT Long Term Goals - 04/04/16 1614      PT LONG TERM GOAL #1   Title verbalize understanding of fall prevention strategies (Target Date for all LTGs: 05/02/16)   Time 4   Period Weeks   Status  New     PT LONG TERM GOAL #2   Title independent with HEP    Status New     PT LONG TERM GOAL #3   Title trial AFO to determine if AFO appropriate   Status New     PT LONG TERM GOAL #4   Title improve BERG balance score to >/= 45/56 for decreased fall risk   Status New     PT LONG TERM GOAL #5   Title amb > 500' with LRAD modified independent on various indoor/outdoor surfaces for improved community ambulation   Status New               Plan - 04/04/16 1612    Clinical Impression Statement Pt is an 83 y/o male who presents to [facility] for low complexity PT eval of decreased balance due to neuropathy.  Pt demonstrates gait abnormalities, decreased balance, and decreased strength affecting safe functional mobility.  Will benefit from PT to address deficits listed.   Rehab Potential Good   Clinical Impairments Affecting Rehab Potential pt resistant to AFO/walker   PT Frequency 2x / week   PT Duration 4 weeks   PT Treatment/Interventions ADLs/Self Care Home Management;Neuromuscular re-education;Balance training;Therapeutic exercise;Therapeutic activities;Functional mobility training;Stair training;Gait training;DME Instruction;Patient/family education;Orthotic Fit/Training;Vestibular   PT Next Visit Plan HEP for balance, ankle strengthening, try foot up AFO then if needed other AFO   Consulted and Agree with Plan of Care Patient      Patient will benefit from skilled therapeutic intervention in order to improve the following deficits and impairments:  Abnormal gait, Decreased balance, Difficulty walking, Decreased strength  Visit Diagnosis: Other abnormalities of gait and mobility - Plan: PT plan of care cert/re-cert  Muscle weakness  (generalized) - Plan: PT plan of care cert/re-cert  Repeated falls - Plan: PT plan of care cert/re-cert      G-Codes - [id number]    Functional Assessment Tool Used BERG 37/56   Functional Limitation Mobility: Walking and moving around   Mobility: Walking and Moving Around Current Status [id number]) At least 1 percent but less than 20 percent impaired, limited or restricted   Mobility: Walking and Moving Around Goal Status [id number]) At least 1 percent but less than 20 percent impaired, limited or restricted       Problem List Patient Active Problem List   Diagnosis Date Noted  . Foot drop, bilateral 05/02/2013  . SDH (subdural hematoma) ([place]) 04/30/2013  . Polyneuropathy ([place]) 04/30/2013  . Skull fracture ([place]) 04/30/2013  . Thoracic or lumbosacral neuritis or radiculitis, unspecified 03/18/2012  . Hereditary and idiopathic peripheral neuropathy 03/18/2012  . Abnormality of gait 03/18/2012  . Concussion with no loss of consciousness 03/18/2012  . Other and unspecified hyperlipidemia 03/18/2012  . Unspecified essential hypertension 03/18/2012      Laureen Abrahams, PT, [name] 04/04/16 4:18 PM    [facility] [address] [place], [place], [address] Phone: [phone]   Fax:  [phone]  Name: Timothy Khan MRN: [id number] Date of Birth: 01/02/1932

## 2016-04-12 ENCOUNTER — Ambulatory Visit: Payer: 59 | Admitting: Physical Therapy

## 2016-04-12 DIAGNOSIS — R2689 Other abnormalities of gait and mobility: Secondary | ICD-10-CM | POA: Diagnosis not present

## 2016-04-12 DIAGNOSIS — M6281 Muscle weakness (generalized): Secondary | ICD-10-CM

## 2016-04-12 DIAGNOSIS — R296 Repeated falls: Secondary | ICD-10-CM

## 2016-04-12 NOTE — Patient Instructions (Addendum)
PLANTARFLEXION STRENGTHENING:   Balancing ActANKLE: Plantarflexion, Bilateral - Standing   Stand with upright posture. Raise heels up as high as possible. 12 reps per set, 2 sets per day, 5 days per week Hold onto a support and alternate raising R and L arm.  Repeat standing on an old pillow, 2 sets x 12 reps  http://orth.exer.us/38    FUNCTIONAL MOBILITY: Toe Walking   Walk forward on toes x 10 steps 4 reps per set, 1 sets per day, 5 days per week Use assistive device.    DORSIFLEXION STRENGTHENING:  Toe Raise (Standing)   Rock back on heels, hold on to countertop Repeat 12 times per set. Do 2 sets per session. Do 2 sessions per day.  http://orth.exer.us/42   FUNCTIONAL MOBILITY: Heel Walking   Walk forward on heels holding on to countertop x 10 steps  4 reps per set, 1 sets per day, 5 days per week Use assistive device.  .Feet Heel-Toe "Tandem"    Hold onto countertop, walk a straight line bringing one foot directly in front of the other. Repeat for 10 steps x 4 repetitions per session. Do __1__ sessions per day.  Copyright  VHI. All rights reserved.    Feet Partial Heel-Toe, Head Motion - Eyes Open    With eyes open, right foot in front of the other, Keep head still, eyes looking forwards, one hand holding onto chair.  Hold 30 seconds.  Switch legs and repeat. Repeat 2 times per side. Do 1 sessions per day.  Copyright  VHI. All rights reserved.

## 2016-04-12 NOTE — Therapy (Signed)
[facility] [address] [place], [place], [address] Phone: [phone]   Fax:  [phone]  Physical Therapy Treatment  Patient Details  Name: Timothy Khan MRN: [id number] Date of Birth: May 02, 1931 Referring Provider: Ward Givens, NP  Encounter Date: 04/12/2016      PT End of Session - 04/12/16 1403    Visit Number 2   Number of Visits 9   Date for PT Re-Evaluation 05/02/16   Authorization Type UHC Medicare   PT Start Time 1318   PT Stop Time 1401   PT Time Calculation (min) 43 min   Activity Tolerance Patient tolerated treatment well   Behavior During Therapy [facility] for tasks assessed/performed      Past Medical History:  Diagnosis Date  . Abnormal brain MRI    Right side  . Basal cell carcinoma of back   . BPH (benign prostatic hyperplasia)   . Concussion 1950   Baseball injury  . Foot drop, bilateral 05/02/2013  . Gait disturbance   . Gastroesophageal reflux disease   . Hyperlipidemia   . Hypertension   . Lumbago   . Peripheral neuropathy ([place])   . Skull fracture [facility])     Past Surgical History:  Procedure Laterality Date  . CATARACT EXTRACTION Bilateral   . KNEE SURGERY Left   . skin cancer resection     basal cell, [place] carcinoma  . TONSILLECTOMY    . VASECTOMY      There were no vitals filed for this visit.      Subjective Assessment - 04/12/16 1323    Subjective No complaints.  No falls.     Pertinent History peripheral neuropathy, HTN, HLD, fall s/p SDH in Feb 2015    Limitations Walking   Patient Stated Goals improve balance, decrease falls   Currently in Pain? No/denies           [facility] Adult PT Treatment/Exercise - 04/12/16 1323      Exercises   Exercises Ankle     Ankle Exercises: Standing   Heel Raises Other (comment);Limitations  2 sets x 12 reps, alternating UE raises   Heel Raises Limitations 2 sets x 12 reps with alternating UE raises   Toe Raise  Limitations   Toe Raise Limitations 2 sets x 12 reps bilat UE support   Heel Walk (Round Trip) bilat UE support x 2 reps   Toe Walk (Round Trip) bilat UE support x 2 reps             Balance Exercises - 04/12/16 1350      Balance Exercises: Standing   Tandem Stance Eyes open;Upper extremity support 1;2 reps;30 secs  in corner           PT Education - 04/12/16 1402    Education provided Yes   Education Details HEP, educated on foot up brace for DF assist on R foot   Person(s) Educated Patient   Methods Explanation;Demonstration   Comprehension Returned demonstration             PT Long Term Goals - 04/04/16 1614      PT LONG TERM GOAL #1   Title verbalize understanding of fall prevention strategies (Target Date for all LTGs: 05/02/16)   Time 4   Period Weeks   Status New     PT LONG TERM GOAL #2   Title independent with HEP    Status New     PT LONG TERM GOAL #3  Title trial AFO to determine if AFO appropriate   Status New     PT LONG TERM GOAL #4   Title improve BERG balance score to >/= 45/56 for decreased fall risk   Status New     PT LONG TERM GOAL #5   Title amb > 500' with LRAD modified independent on various indoor/outdoor surfaces for improved community ambulation   Status New               Plan - 04/12/16 1611    Clinical Impression Statement Treatment session with focus on reviewing and having pt return demonstrate ankle strengthening and balance exercises as well as assessment of use of R foot up brace to minimize foot drop during gait R>L foot drop.  Will continue to assess at next visit outside on uneven terrain to see if foot up brace will be sufficient vs. PLS due to pt performing Silver Sneakers every day.     Rehab Potential Good   Clinical Impairments Affecting Rehab Potential pt resistant to AFO/walker   PT Treatment/Interventions ADLs/Self Care Home Management;Neuromuscular re-education;Balance training;Therapeutic  exercise;Therapeutic activities;Functional mobility training;Stair training;Gait training;DME Instruction;Patient/family education;Orthotic Fit/Training;Vestibular   PT Next Visit Plan Foot up brace vs. PLS outside over uneven terrain; review HEP, add corner exercises especially with eyes closed as appropriate   Consulted and Agree with Plan of Care Patient      Patient will benefit from skilled therapeutic intervention in order to improve the following deficits and impairments:  Abnormal gait, Decreased balance, Difficulty walking, Decreased strength  Visit Diagnosis: Other abnormalities of gait and mobility  Muscle weakness (generalized)  Repeated falls     Problem List Patient Active Problem List   Diagnosis Date Noted  . Foot drop, bilateral 05/02/2013  . SDH (subdural hematoma) ([place]) 04/30/2013  . Polyneuropathy ([place]) 04/30/2013  . Skull fracture ([place]) 04/30/2013  . Thoracic or lumbosacral neuritis or radiculitis, unspecified 03/18/2012  . Hereditary and idiopathic peripheral neuropathy 03/18/2012  . Abnormality of gait 03/18/2012  . Concussion with no loss of consciousness 03/18/2012  . Other and unspecified hyperlipidemia 03/18/2012  . Unspecified essential hypertension 03/18/2012   Timothy Khan, PT, [name] 04/12/16    4:35 PM   [facility] [address] [place], [place], [address] Phone: [phone]   Fax:  [phone]  Name: Timothy Khan MRN: [id number] Date of Birth: 02/02/1932

## 2016-04-14 ENCOUNTER — Encounter: Payer: Self-pay | Admitting: Physical Therapy

## 2016-04-14 ENCOUNTER — Ambulatory Visit: Payer: 59 | Admitting: Physical Therapy

## 2016-04-14 DIAGNOSIS — R2689 Other abnormalities of gait and mobility: Secondary | ICD-10-CM

## 2016-04-14 DIAGNOSIS — R296 Repeated falls: Secondary | ICD-10-CM

## 2016-04-14 DIAGNOSIS — M6281 Muscle weakness (generalized): Secondary | ICD-10-CM

## 2016-04-14 NOTE — Therapy (Signed)
[facility] [address] [place], [place], [address] Phone: [phone]   Fax:  [phone]  Physical Therapy Treatment  Patient Details  Name: Timothy Khan MRN: [id number] Date of Birth: [birth year] Referring Provider: Ward Givens, NP  Encounter Date: 04/14/2016      PT End of Session - 04/14/16 1556    Visit Number 3   Number of Visits 9   Date for PT Re-Evaluation 05/02/16   Authorization Type UHC Medicare   PT Start Time 1405   PT Stop Time 1445   PT Time Calculation (min) 40 min   Activity Tolerance Patient tolerated treatment well   Behavior During Therapy [facility] for tasks assessed/performed      Past Medical History:  Diagnosis Date  . Abnormal brain MRI    Right side  . Basal cell carcinoma of back   . BPH (benign prostatic hyperplasia)   . Concussion 1950   Baseball injury  . Foot drop, bilateral 05/02/2013  . Gait disturbance   . Gastroesophageal reflux disease   . Hyperlipidemia   . Hypertension   . Lumbago   . Peripheral neuropathy ([place])   . Skull fracture [facility])     Past Surgical History:  Procedure Laterality Date  . CATARACT EXTRACTION Bilateral   . KNEE SURGERY Left   . skin cancer resection     basal cell, [place] carcinoma  . TONSILLECTOMY    . VASECTOMY      There were no vitals filed for this visit.      Subjective Assessment - 04/14/16 1408    Subjective Reports performing exercises at home, no issues.  Reports Silver Sneakers has upped its intensity.   Pertinent History peripheral neuropathy, HTN, HLD, fall s/p SDH in Feb 2015    Limitations Walking   Patient Stated Goals improve balance, decrease falls   Currently in Pain? No/denies           [facility] Adult PT Treatment/Exercise - 04/14/16 1552      Ambulation/Gait   Ambulation/Gait Yes   Ambulation/Gait Assistance 5: Supervision   Ambulation Distance (Feet) 1000 Feet   Assistive device Other (Comment)  [place]  Foot up Brace and then again with PLS   Gait Pattern Step-through pattern;Decreased dorsiflexion - right;Poor foot clearance - right   Ambulation Surface Unlevel;Outdoor;Paved;Gravel;Grass;Other (comment)  mulch, up/down curb             Balance Exercises - 04/14/16 1443      Balance Exercises: Standing   Standing Eyes Opened Narrow base of support (BOS);Foam/compliant surface;30 secs   Tandem Stance Eyes open;Upper extremity support 1;2 reps  with head nods and head turns x 10 reps each           PT Education - 04/14/16 1554    Education provided Yes   Education Details Discussed differences between foot up brace and PLS for DF assistance; discussed pros and cons of each.  Provided pt with handout of foot up brace if pt decides to purchase.    Person(s) Educated Patient   Methods Explanation;Demonstration;Handout   Comprehension Verbalized understanding             PT Long Term Goals - 04/04/16 1614      PT LONG TERM GOAL #1   Title verbalize understanding of fall prevention strategies (Target Date for all LTGs: 05/02/16)   Time 4   Period Weeks   Status New     PT LONG TERM GOAL #2  Title independent with HEP    Status New     PT LONG TERM GOAL #3   Title trial AFO to determine if AFO appropriate   Status New     PT LONG TERM GOAL #4   Title improve BERG balance score to >/= 45/56 for decreased fall risk   Status New     PT LONG TERM GOAL #5   Title amb > 500' with LRAD modified independent on various indoor/outdoor surfaces for improved community ambulation   Status New               Plan - 04/14/16 1557    Clinical Impression Statement Treatment session with focus on comparing dynamic gait outside over unlevel surfaces with foot up brace and then with PLS.  Pt demonstrated safe gait over all surfaces with foot up brace with no evidence of foot drop or tripping over uneven ground.  With PLS pt reporting having worse balance with "spring  action" and reports pain in medial malleolus from medial post of orthosis; pt demonstrating increased imbalance with PLS.  Pt to make decision about purchasing foot up brace.  If pt does not purchase foot up brace, may benefit from eval with orthotist to determine need for custom PLS vs. another off shelf model.  Continued to progress corner balance exercises including compliant surface and head movements; due to degree of imbalance will not add to HEP at this time.  Will continue to address.    Rehab Potential Good   Clinical Impairments Affecting Rehab Potential pt resistant to AFO/walker   PT Treatment/Interventions ADLs/Self Care Home Management;Neuromuscular re-education;Balance training;Therapeutic exercise;Therapeutic activities;Functional mobility training;Stair training;Gait training;DME Instruction;Patient/family education;Orthotic Fit/Training;Vestibular   PT Next Visit Plan Continue to progress HEP, SOT training, balance strategy training   Consulted and Agree with Plan of Care Patient      Patient will benefit from skilled therapeutic intervention in order to improve the following deficits and impairments:  Abnormal gait, Decreased balance, Difficulty walking, Decreased strength  Visit Diagnosis: Other abnormalities of gait and mobility  Muscle weakness (generalized)  Repeated falls     Problem List Patient Active Problem List   Diagnosis Date Noted  . Foot drop, bilateral 05/02/2013  . SDH (subdural hematoma) ([place]) 04/30/2013  . Polyneuropathy ([place]) 04/30/2013  . Skull fracture ([place]) 04/30/2013  . Thoracic or lumbosacral neuritis or radiculitis, unspecified 03/18/2012  . Hereditary and idiopathic peripheral neuropathy 03/18/2012  . Abnormality of gait 03/18/2012  . Concussion with no loss of consciousness 03/18/2012  . Other and unspecified hyperlipidemia 03/18/2012  . Unspecified essential hypertension 03/18/2012   Timothy Khan, PT, [name] 04/14/16    4:08 PM    [facility] [address] [place], [place], [address] Phone: [phone]   Fax:  [phone]  Name: Timothy Khan MRN: [id number] Date of Birth: 03/17/1932

## 2016-04-18 ENCOUNTER — Ambulatory Visit: Payer: 59 | Admitting: Physical Therapy

## 2016-04-18 DIAGNOSIS — R296 Repeated falls: Secondary | ICD-10-CM

## 2016-04-18 DIAGNOSIS — M6281 Muscle weakness (generalized): Secondary | ICD-10-CM

## 2016-04-18 DIAGNOSIS — R2689 Other abnormalities of gait and mobility: Secondary | ICD-10-CM | POA: Diagnosis not present

## 2016-04-18 NOTE — Therapy (Signed)
[facility] [address] [place], [place], [address] Phone: [phone]   Fax:  [phone]  Physical Therapy Treatment  Patient Details  Name: Timothy Khan MRN: [id number] Date of Birth: 08-Jan-1932 Referring Provider: Ward Givens, NP  Encounter Date: 04/18/2016      PT End of Session - 04/18/16 1249    Visit Number 4   Number of Visits 9   Date for PT Re-Evaluation 05/02/16   Authorization Type UHC Medicare   PT Start Time 1148   PT Stop Time 1227   PT Time Calculation (min) 39 min   Equipment Utilized During Treatment Gait belt   Activity Tolerance Patient tolerated treatment well   Behavior During Therapy [facility] for tasks assessed/performed      Past Medical History:  Diagnosis Date  . Abnormal brain MRI    Right side  . Basal cell carcinoma of back   . BPH (benign prostatic hyperplasia)   . Concussion 1950   Baseball injury  . Foot drop, bilateral 05/02/2013  . Gait disturbance   . Gastroesophageal reflux disease   . Hyperlipidemia   . Hypertension   . Lumbago   . Peripheral neuropathy ([place])   . Skull fracture [facility])     Past Surgical History:  Procedure Laterality Date  . CATARACT EXTRACTION Bilateral   . KNEE SURGERY Left   . skin cancer resection     basal cell, [place] carcinoma  . TONSILLECTOMY    . VASECTOMY      There were no vitals filed for this visit.      Subjective Assessment - 04/18/16 1150    Subjective doing well, had a lot of family in town last weekend so didn't do exercises.   Patient Stated Goals improve balance, decrease falls   Currently in Pain? No/denies                         [facility] Adult PT Treatment/Exercise - 04/18/16 1154      Ankle Exercises: Standing   Heel Raises 10 reps   Toe Raise 10 reps   Heel Walk (Round Trip) bilat UE support x 2 reps   Toe Walk (Round Trip) bilat UE support x 2 reps             Balance Exercises -  04/18/16 1202      Balance Exercises: Standing   Wall Bumps Hip   Wall Bumps-Hips Anterior/posterior;Eyes opened;Foam/compliant surface;10 reps  unable to perform with EC   Stepping Strategy Anterior;Posterior;Lateral;10 reps   Rockerboard Anterior/posterior;Lateral;Head turns;10 reps;Intermittent UE support  min A   Partial Tandem Stance Eyes open;2 reps;Intermittent upper extremity support;30 secs                PT Long Term Goals - 04/04/16 1614      PT LONG TERM GOAL #1   Title verbalize understanding of fall prevention strategies (Target Date for all LTGs: 05/02/16)   Time 4   Period Weeks   Status New     PT LONG TERM GOAL #2   Title independent with HEP    Status New     PT LONG TERM GOAL #3   Title trial AFO to determine if AFO appropriate   Status New     PT LONG TERM GOAL #4   Title improve BERG balance score to >/= 45/56 for decreased fall risk   Status New     PT LONG TERM  GOAL #5   Title amb > 500' with LRAD modified independent on various indoor/outdoor surfaces for improved community ambulation   Status New               Plan - 04/18/16 1250    Clinical Impression Statement Session today focused on review of HEP as pt reports minimal compliance, and all exercises remain appropriate.  Initiated training of balance strategies and pt unable to perform wall bumps with EC as no perceptual awareness of balance.  Will continue to benefit from PT to maximize function.   PT Treatment/Interventions ADLs/Self Care Home Management;Neuromuscular re-education;Balance training;Therapeutic exercise;Therapeutic activities;Functional mobility training;Stair training;Gait training;DME Instruction;Patient/family education;Orthotic Fit/Training;Vestibular   PT Next Visit Plan balance strategy training, SOT training, gait with foot up    Consulted and Agree with Plan of Care Patient      Patient will benefit from skilled therapeutic intervention in order to  improve the following deficits and impairments:  Abnormal gait, Decreased balance, Difficulty walking, Decreased strength  Visit Diagnosis: Other abnormalities of gait and mobility  Muscle weakness (generalized)  Repeated falls     Problem List Patient Active Problem List   Diagnosis Date Noted  . Foot drop, bilateral 05/02/2013  . SDH (subdural hematoma) ([place]) 04/30/2013  . Polyneuropathy ([place]) 04/30/2013  . Skull fracture ([place]) 04/30/2013  . Thoracic or lumbosacral neuritis or radiculitis, unspecified 03/18/2012  . Hereditary and idiopathic peripheral neuropathy 03/18/2012  . Abnormality of gait 03/18/2012  . Concussion with no loss of consciousness 03/18/2012  . Other and unspecified hyperlipidemia 03/18/2012  . Unspecified essential hypertension 03/18/2012      Laureen Abrahams, PT, [name] 04/18/16 12:52 PM     [facility] [address] [place], [place], [address] Phone: [phone]   Fax:  [phone]  Name: JARYN ZETTERBERG MRN: [id number] Date of Birth: 11/03/1931

## 2016-04-21 ENCOUNTER — Ambulatory Visit: Payer: 59 | Attending: Adult Health | Admitting: Physical Therapy

## 2016-04-21 ENCOUNTER — Encounter: Payer: Self-pay | Admitting: Physical Therapy

## 2016-04-21 DIAGNOSIS — M6281 Muscle weakness (generalized): Secondary | ICD-10-CM | POA: Diagnosis present

## 2016-04-21 DIAGNOSIS — R2689 Other abnormalities of gait and mobility: Secondary | ICD-10-CM | POA: Diagnosis not present

## 2016-04-21 DIAGNOSIS — R296 Repeated falls: Secondary | ICD-10-CM

## 2016-04-21 NOTE — Patient Instructions (Signed)
Gaze Stabilization - Tip Card  1.Target must remain in focus, not blurry, and appear stationary while head is in motion. 2.Perform exercises with small head movements (45 to either side of midline). 3.Increase speed of head motion so long as target is in focus. 4.If you wear eyeglasses, be sure you can see target through lens (therapist will give specific instructions for bifocal / progressive lenses). 5.These exercises may provoke dizziness or nausea. Work through these symptoms. If too dizzy, slow head movement slightly. Rest between each exercise. 6.Exercises demand concentration; avoid distractions. 7.For safety, perform standing exercises close to a counter, wall, corner, or next to someone.  Copyright  VHI. All rights reserved.   Gaze Stabilization - Standing Feet Apart   Feet shoulder width apart, keeping eyes on target on wall 3 feet away, tilt head down slightly and move head side to side for 30 seconds. Repeat while moving head up and down for 30 seconds. *Work up to tolerating 60 seconds, as able. Do 2-3 sessions per day.   Copyright  VHI. All rights reserved.    

## 2016-04-21 NOTE — Therapy (Signed)
[facility] [address] [place], [place], [address] Phone: [phone]   Fax:  [phone]  Physical Therapy Treatment  Patient Details  Name: Timothy Khan MRN: [id number] Date of Birth: 06-22-1931 Referring Provider: Ward Givens, NP  Encounter Date: 04/21/2016      PT End of Session - 04/21/16 1543    Visit Number 5   Number of Visits 9   Date for PT Re-Evaluation 05/02/16   Authorization Type UHC Medicare   PT Start Time [id number]   PT Stop Time 1530   PT Time Calculation (min) 47 min   Activity Tolerance Patient tolerated treatment well   Behavior During Therapy [facility] for tasks assessed/performed      Past Medical History:  Diagnosis Date  . Abnormal brain MRI    Right side  . Basal cell carcinoma of back   . BPH (benign prostatic hyperplasia)   . Concussion 1950   Baseball injury  . Foot drop, bilateral 05/02/2013  . Gait disturbance   . Gastroesophageal reflux disease   . Hyperlipidemia   . Hypertension   . Lumbago   . Peripheral neuropathy ([place])   . Skull fracture [facility])     Past Surgical History:  Procedure Laterality Date  . CATARACT EXTRACTION Bilateral   . KNEE SURGERY Left   . skin cancer resection     basal cell, [place] carcinoma  . TONSILLECTOMY    . VASECTOMY      There were no vitals filed for this visit.      Subjective Assessment - 04/21/16 1446    Subjective Pt doing well this week; still performing exercises.  Have ordered a Foot Up brace for each foot; awaiting delivery.     Pertinent History peripheral neuropathy, HTN, HLD, fall s/p SDH in Feb 2015    Limitations Walking   Patient Stated Goals improve balance, decrease falls   Currently in Pain? No/denies            [facility] PT Assessment - 04/21/16 1515      Standardized Balance Assessment   Standardized Balance Assessment Balance Master Testing   Balance Master Testing Sensory Organization Test      Sensory  Organization Testing= 25% compared to age/height normative value of 65%.   Patient demonstrated 0% use of somatosensory feedback for balance, 100% use of visual feedback for balance, and 0% use of vestibular feedback for balance.       Vestibular Treatment/Exercise - 04/21/16 1541      Vestibular Treatment/Exercise   Vestibular Treatment Provided Gaze   Gaze Exercises X1 Viewing Horizontal;X1 Viewing Vertical     X1 Viewing Horizontal   Foot Position feet apart, feet together in standing   Reps 2   Comments 30 seconds each feet apart, feet together     X1 Viewing Vertical   Foot Position feet apart, feet together in standing   Reps 2   Comments 30 seconds each            PT Education - 04/21/16 1542    Education provided Yes   Education Details SOT results, sensory analysis, role of vestibular system and training of vestibular system.  x 1 viewing added to HEP   Person(s) Educated Patient   Methods Explanation;Demonstration;Handout   Comprehension Verbalized understanding;Returned demonstration             PT Long Term Goals - 04/04/16 1614      PT LONG TERM GOAL #1  Title verbalize understanding of fall prevention strategies (Target Date for all LTGs: 05/02/16)   Time 4   Period Weeks   Status New     PT LONG TERM GOAL #2   Title independent with HEP    Status New     PT LONG TERM GOAL #3   Title trial AFO to determine if AFO appropriate   Status New     PT LONG TERM GOAL #4   Title improve BERG balance score to >/= 45/56 for decreased fall risk   Status New     PT LONG TERM GOAL #5   Title amb > 500' with LRAD modified independent on various indoor/outdoor surfaces for improved community ambulation   Status New               Plan - 04/21/16 1544    Clinical Impression Statement Session today focused on SOT to determine pt use of visual and vestibular systems in absence of sensory system.  Pt noted to use 100% visual system and 0% of sensory  and 0% of vestibular system.  Initiated training of vestibular system but if vestibular dysfunction is due to neuropathy pt may need continued compensatory education.  Will continue to address.   Clinical Impairments Affecting Rehab Potential pt resistant to AFO/walker   PT Treatment/Interventions ADLs/Self Care Home Management;Neuromuscular re-education;Balance training;Therapeutic exercise;Therapeutic activities;Functional mobility training;Stair training;Gait training;DME Instruction;Patient/family education;Orthotic Fit/Training;Vestibular   PT Next Visit Plan strengthening of vestibular system, x 1 viewing; wall bumps and balance reaction training   Consulted and Agree with Plan of Care Patient      Patient will benefit from skilled therapeutic intervention in order to improve the following deficits and impairments:  Abnormal gait, Decreased balance, Difficulty walking, Decreased strength  Visit Diagnosis: Other abnormalities of gait and mobility  Muscle weakness (generalized)  Repeated falls     Problem List Patient Active Problem List   Diagnosis Date Noted  . Foot drop, bilateral 05/02/2013  . SDH (subdural hematoma) ([place]) 04/30/2013  . Polyneuropathy ([place]) 04/30/2013  . Skull fracture ([place]) 04/30/2013  . Thoracic or lumbosacral neuritis or radiculitis, unspecified 03/18/2012  . Hereditary and idiopathic peripheral neuropathy 03/18/2012  . Abnormality of gait 03/18/2012  . Concussion with no loss of consciousness 03/18/2012  . Other and unspecified hyperlipidemia 03/18/2012  . Unspecified essential hypertension 03/18/2012    [name], PT, [name] 04/21/16    3:50 PM    [facility] [address] [place], [place], [address] Phone: [phone]   Fax:  [phone]  Name: Timothy Khan MRN: [id number] Date of Birth: [birth year]

## 2016-04-26 ENCOUNTER — Ambulatory Visit: Payer: 59 | Admitting: Physical Therapy

## 2016-04-27 ENCOUNTER — Ambulatory Visit: Payer: 59 | Admitting: Physical Therapy

## 2016-04-27 ENCOUNTER — Encounter: Payer: Self-pay | Admitting: Physical Therapy

## 2016-04-27 DIAGNOSIS — R296 Repeated falls: Secondary | ICD-10-CM

## 2016-04-27 DIAGNOSIS — R2689 Other abnormalities of gait and mobility: Secondary | ICD-10-CM | POA: Diagnosis not present

## 2016-04-27 DIAGNOSIS — M6281 Muscle weakness (generalized): Secondary | ICD-10-CM

## 2016-04-27 NOTE — Therapy (Signed)
[facility] [address] [place], [place], [address] Phone: [phone]   Fax:  [phone]  Physical Therapy Treatment  Patient Details  Name: Timothy Khan MRN: [id number] Date of Birth: May 11, 1931 Referring Provider: Ward Givens, NP  Encounter Date: 04/27/2016      PT End of Session - 04/27/16 1613    Visit Number 6   Number of Visits 9   Date for PT Re-Evaluation 05/02/16   Authorization Type UHC Medicare   PT Start Time 1318   PT Stop Time 1359   PT Time Calculation (min) 41 min   Activity Tolerance Patient tolerated treatment well   Behavior During Therapy [facility] for tasks assessed/performed      Past Medical History:  Diagnosis Date  . Abnormal brain MRI    Right side  . Basal cell carcinoma of back   . BPH (benign prostatic hyperplasia)   . Concussion 1950   Baseball injury  . Foot drop, bilateral 05/02/2013  . Gait disturbance   . Gastroesophageal reflux disease   . Hyperlipidemia   . Hypertension   . Lumbago   . Peripheral neuropathy ([place])   . Skull fracture [facility])     Past Surgical History:  Procedure Laterality Date  . CATARACT EXTRACTION Bilateral   . KNEE SURGERY Left   . skin cancer resection     basal cell, [place] carcinoma  . TONSILLECTOMY    . VASECTOMY      There were no vitals filed for this visit.      Subjective Assessment - 04/27/16 1323    Subjective Pt has not received foot up braces yet-going to check tracking number; has been very busy with house chores, yard work and shopping.  No falls.   Pertinent History peripheral neuropathy, HTN, HLD, fall s/p SDH in Feb 2015    Limitations Walking   Patient Stated Goals improve balance, decrease falls   Currently in Pain? No/denies          Balance Exercises - 04/27/16 1326      Balance Exercises: Standing   Wall Bumps Hip   Wall Bumps-Hips Eyes opened;Eyes closed;Anterior/posterior;Right/left (lateral);10  reps;Other (comment)   Tandem Gait Forward;Upper extremity support;4 reps;Other (comment)  with vertical and horizontal head turns   Sit to Stand Time from low mat with use of UE flexion<> extension and tactile feedback of finger tips on mat to improve control of weight shifting and increase hip flexion<> extension   Other Standing Exercises ankle strategy training with finger touch feedback on walls to determine degree of sway     OTAGO PROGRAM   Heel Walking Support   Toe Walk Support           PT Education - 04/27/16 1612    Education provided Yes   Education Details continued education on safety/falls prevention at home, ankle and hip strategies for balance   Person(s) Educated Patient   Methods Explanation;Demonstration   Comprehension Verbalized understanding;Returned demonstration             PT Long Term Goals - 04/04/16 1614      PT LONG TERM GOAL #1   Title verbalize understanding of fall prevention strategies (Target Date for all LTGs: 05/02/16)   Time 4   Period Weeks   Status New     PT LONG TERM GOAL #2   Title independent with HEP    Status New     PT LONG TERM GOAL #3  Title trial AFO to determine if AFO appropriate   Status New     PT LONG TERM GOAL #4   Title improve BERG balance score to >/= 45/56 for decreased fall risk   Status New     PT LONG TERM GOAL #5   Title amb > 500' with LRAD modified independent on various indoor/outdoor surfaces for improved community ambulation   Status New               Plan - 04/27/16 1614    Clinical Impression Statement Treatment session with continued focus on use of ankle, hip strategy training and use of other feedback systems for postural control and balance recovery.  Pt continues to have increased difficulty with proprioception with vision removed.  Pt has not received foot up braces to begin gait training and continues to be at increased risk for falls on uneven surfaces or low light  conditions.  Will continue to address balance and strength deficits to lower falls risk and make progress towards targeted goals.     Rehab Potential Good   Clinical Impairments Affecting Rehab Potential pt resistant to AFO/walker   PT Treatment/Interventions ADLs/Self Care Home Management;Neuromuscular re-education;Balance training;Therapeutic exercise;Therapeutic activities;Functional mobility training;Stair training;Gait training;DME Instruction;Patient/family education;Orthotic Fit/Training;Vestibular   PT Next Visit Plan Re-eval next week-begin to assess LTG, if pt has foot up braces work on gait with foot up brace   Consulted and Agree with Plan of Care Patient      Patient will benefit from skilled therapeutic intervention in order to improve the following deficits and impairments:  Abnormal gait, Decreased balance, Difficulty walking, Decreased strength  Visit Diagnosis: Other abnormalities of gait and mobility  Muscle weakness (generalized)  Repeated falls     Problem List Patient Active Problem List   Diagnosis Date Noted  . Foot drop, bilateral 05/02/2013  . SDH (subdural hematoma) ([place]) 04/30/2013  . Polyneuropathy ([place]) 04/30/2013  . Skull fracture ([place]) 04/30/2013  . Thoracic or lumbosacral neuritis or radiculitis, unspecified 03/18/2012  . Hereditary and idiopathic peripheral neuropathy 03/18/2012  . Abnormality of gait 03/18/2012  . Concussion with no loss of consciousness 03/18/2012  . Other and unspecified hyperlipidemia 03/18/2012  . Unspecified essential hypertension 03/18/2012    [name], PT, [name] 04/27/16    4:48 PM   [facility] [address] [place], [place], [address] Phone: [phone]   Fax:  [phone]  Name: Timothy Khan MRN: [id number] Date of Birth: 12/14/1931

## 2016-04-28 ENCOUNTER — Encounter: Payer: Self-pay | Admitting: Physical Therapy

## 2016-04-28 ENCOUNTER — Ambulatory Visit: Payer: 59 | Admitting: Physical Therapy

## 2016-04-28 DIAGNOSIS — M6281 Muscle weakness (generalized): Secondary | ICD-10-CM

## 2016-04-28 DIAGNOSIS — R2689 Other abnormalities of gait and mobility: Secondary | ICD-10-CM

## 2016-04-28 DIAGNOSIS — R296 Repeated falls: Secondary | ICD-10-CM

## 2016-04-28 NOTE — Therapy (Signed)
[facility] [address] [place], [place], [address] Phone: [phone]   Fax:  [phone]  Physical Therapy Treatment  Patient Details  Name: Timothy Khan MRN: [id number] Date of Birth: 17-Feb-1932 Referring Provider: Ward Givens, NP  Encounter Date: 04/28/2016      PT End of Session - 04/28/16 1653    Visit Number 7   Number of Visits 9   Date for PT Re-Evaluation 05/02/16   Authorization Type UHC Medicare   PT Start Time [id number]   PT Stop Time 1446   PT Time Calculation (min) 41 min   Activity Tolerance Patient tolerated treatment well   Behavior During Therapy [facility] for tasks assessed/performed      Past Medical History:  Diagnosis Date  . Abnormal brain MRI    Right side  . Basal cell carcinoma of back   . BPH (benign prostatic hyperplasia)   . Concussion 1950   Baseball injury  . Foot drop, bilateral 05/02/2013  . Gait disturbance   . Gastroesophageal reflux disease   . Hyperlipidemia   . Hypertension   . Lumbago   . Peripheral neuropathy ([place])   . Skull fracture [facility])     Past Surgical History:  Procedure Laterality Date  . CATARACT EXTRACTION Bilateral   . KNEE SURGERY Left   . skin cancer resection     basal cell, [place] carcinoma  . TONSILLECTOMY    . VASECTOMY      There were no vitals filed for this visit.      Subjective Assessment - 04/28/16 1408    Subjective No issues since yesterday; called [employer] about foot up braces-should be delivered tomorrow   Pertinent History peripheral neuropathy, HTN, HLD, fall s/p SDH in Feb 2015    Limitations Walking   Patient Stated Goals improve balance, decrease falls          Vestibular Treatment/Exercise - 04/28/16 1429      Vestibular Treatment/Exercise   Gaze Exercises X1 Viewing Horizontal;X1 Viewing Vertical     X1 Viewing Horizontal   Foot Position feet together/solid surface, tandem/solid surface, feet apart/compliant  surface, DF on wedge, PF on wedge   Reps 1   Comments each position 60 seconds     X1 Viewing Vertical   Foot Position feet together/solid surface, tandem/solid surface, feet apart/compliant surface, DF on wedge, PF on wedge   Reps 1   Comments each position 60 seconds            Balance Exercises - 04/28/16 1431      Balance Exercises: Standing   Rockerboard Anterior/posterior;EO;Other (comment)  hip and ankle strategies x 10 reps each, bilat UE support   Marching Limitations on compliant surface x 8 reps with bilat UE support     OTAGO PROGRAM   Sideways Walking Assistive device  on compliant surface x 8 reps, bilat UE support           PT Education - 04/28/16 1652    Education provided Yes   Education Details PT plan for next couple of visits; advised to bring foot up braces to next visit   Person(s) Educated Patient   Methods Explanation   Comprehension Verbalized understanding             PT Long Term Goals - 04/04/16 1614      PT LONG TERM GOAL #1   Title verbalize understanding of fall prevention strategies (Target Date for all LTGs: 05/02/16)   Time  4   Period Weeks   Status New     PT LONG TERM GOAL #2   Title independent with HEP    Status New     PT LONG TERM GOAL #3   Title trial AFO to determine if AFO appropriate   Status New     PT LONG TERM GOAL #4   Title improve BERG balance score to >/= 45/56 for decreased fall risk   Status New     PT LONG TERM GOAL #5   Title amb > 500' with LRAD modified independent on various indoor/outdoor surfaces for improved community ambulation   Status New               Plan - 04/28/16 1653    Clinical Impression Statement Continued focus on NMR for retraining of vestibular system during x 1 viewing with various foot positions; pt tolerated well.  Also continued to focus on ankle and hip strategy training and strengthening.  Pt to receive foot up braces tomorrow; advised to bring in to next  visit.   Rehab Potential Good   Clinical Impairments Affecting Rehab Potential pt resistant to AFO/walker   PT Treatment/Interventions ADLs/Self Care Home Management;Neuromuscular re-education;Balance training;Therapeutic exercise;Therapeutic activities;Functional mobility training;Stair training;Gait training;DME Instruction;Patient/family education;Orthotic Fit/Training;Vestibular   PT Next Visit Plan Re-eval next week-begin to assess LTG, if pt has foot up braces work on gait over various indoor/outdoor surfaces with foot up brace   Consulted and Agree with Plan of Care Patient      Patient will benefit from skilled therapeutic intervention in order to improve the following deficits and impairments:  Abnormal gait, Decreased balance, Difficulty walking, Decreased strength  Visit Diagnosis: Other abnormalities of gait and mobility  Muscle weakness (generalized)  Repeated falls     Problem List Patient Active Problem List   Diagnosis Date Noted  . Foot drop, bilateral 05/02/2013  . SDH (subdural hematoma) ([place]) 04/30/2013  . Polyneuropathy ([place]) 04/30/2013  . Skull fracture ([place]) 04/30/2013  . Thoracic or lumbosacral neuritis or radiculitis, unspecified 03/18/2012  . Hereditary and idiopathic peripheral neuropathy 03/18/2012  . Abnormality of gait 03/18/2012  . Concussion with no loss of consciousness 03/18/2012  . Other and unspecified hyperlipidemia 03/18/2012  . Unspecified essential hypertension 03/18/2012   [name], PT, [name] 04/28/16    4:56 PM    [facility] [address] [place], [place], [address] Phone: [phone]   Fax:  [phone]  Name: Timothy Khan MRN: [id number] Date of Birth: 15-Jul-1931

## 2016-05-01 ENCOUNTER — Other Ambulatory Visit: Payer: Self-pay | Admitting: Neurology

## 2016-05-02 ENCOUNTER — Ambulatory Visit: Payer: 59 | Admitting: Physical Therapy

## 2016-05-02 DIAGNOSIS — R2689 Other abnormalities of gait and mobility: Secondary | ICD-10-CM | POA: Diagnosis not present

## 2016-05-02 DIAGNOSIS — M6281 Muscle weakness (generalized): Secondary | ICD-10-CM

## 2016-05-02 DIAGNOSIS — R296 Repeated falls: Secondary | ICD-10-CM

## 2016-05-02 NOTE — Patient Instructions (Signed)

## 2016-05-02 NOTE — Therapy (Signed)
[facility] [address] [place], [place], [address] Phone: [phone]   Fax:  [phone]  Physical Therapy Treatment/Discharge  Patient Details  Name: Timothy Khan MRN: [id number] Date of Birth: March 30, 1931 Referring Provider: Ward Givens, NP  Encounter Date: 05/02/2016      PT End of Session - 05/02/16 1359    Visit Number 8   Authorization Type UHC Medicare   PT Start Time 3016   PT Stop Time 0109   PT Time Calculation (min) 44 min   Equipment Utilized During Treatment Gait belt   Activity Tolerance Patient tolerated treatment well   Behavior During Therapy [facility] for tasks assessed/performed      Past Medical History:  Diagnosis Date  . Abnormal brain MRI    Right side  . Basal cell carcinoma of back   . BPH (benign prostatic hyperplasia)   . Concussion 1950   Baseball injury  . Foot drop, bilateral 05/02/2013  . Gait disturbance   . Gastroesophageal reflux disease   . Hyperlipidemia   . Hypertension   . Lumbago   . Peripheral neuropathy ([place])   . Skull fracture [facility])     Past Surgical History:  Procedure Laterality Date  . CATARACT EXTRACTION Bilateral   . KNEE SURGERY Left   . skin cancer resection     basal cell, [place] carcinoma  . TONSILLECTOMY    . VASECTOMY      There were no vitals filed for this visit.      Subjective Assessment - 05/02/16 1316    Subjective arrive with 2 foot up AFOs   Pertinent History peripheral neuropathy, HTN, HLD, fall s/p SDH in Feb 2015    Patient Stated Goals improve balance, decrease falls   Currently in Pain? No/denies            [facility] PT Assessment - 05/02/16 1345      Observation/Other Assessments   Focus on Therapeutic Outcomes (FOTO)  83 (17% limited)   Activities of Balance Confidence Scale (ABC Scale)  81.9%     Ambulation/Gait   Ambulation/Gait Yes   Ambulation/Gait Assistance 7: Independent   Ambulation Distance (Feet) 500  Feet   Assistive device --  bil foot up     Berg Balance Test   Sit to Stand Able to stand without using hands and stabilize independently   Standing Unsupported Able to stand safely 2 minutes   Sitting with Back Unsupported but Feet Supported on Floor or Stool Able to sit safely and securely 2 minutes   Stand to Sit Sits safely with minimal use of hands   Transfers Able to transfer safely, minor use of hands   Standing Unsupported with Eyes Closed Unable to keep eyes closed 3 seconds but stays steady   Standing Ubsupported with Feet Together Able to place feet together independently and stand 1 minute safely   From Standing, Reach Forward with Outstretched Arm Can reach confidently >25 cm (10")   From Standing Position, Pick up Object from Floor Able to pick up shoe safely and easily   From Standing Position, Turn to Look Behind Over each Shoulder Looks behind one side only/other side shows less weight shift   Turn 360 Degrees Able to turn 360 degrees safely but slowly   Standing Unsupported, Alternately Place Feet on Step/Stool Able to stand independently and safely and complete 8 steps in 20 seconds   Standing Unsupported, One Foot in Front Able to plae foot ahead of  the other independently and hold 30 seconds   Standing on One Leg Tries to lift leg/unable to hold 3 seconds but remains standing independently   Total Score 46                     [facility] Adult PT Treatment/Exercise - 2016-05-13 1345      Self-Care   Self-Care Other Self-Care Comments   Other Self-Care Comments  fall prevention; application, donning/doffing foot up AFOs                PT Education - May 13, 2016 1358    Education provided Yes   Education Details see self care   Person(s) Educated Patient   Methods Explanation;Handout   Comprehension Verbalized understanding             PT Long Term Goals - 2016-05-13 1359      PT LONG TERM GOAL #1   Title verbalize understanding of fall  prevention strategies (Target Date for all LTGs: 05-13-16)   Status Achieved     PT LONG TERM GOAL #2   Title independent with HEP    Status Achieved     PT LONG TERM GOAL #3   Title trial AFO to determine if AFO appropriate   Status Achieved     PT LONG TERM GOAL #4   Title improve BERG balance score to >/= 45/56 for decreased fall risk   Status Achieved     PT LONG TERM GOAL #5   Title amb > 500' with LRAD modified independent on various indoor/outdoor surfaces for improved community ambulation   Status Achieved               Plan - 2016/05/13 1359    Clinical Impression Statement Pt has met all goals and is ready for d/c.  Continues to have balance deficits consistent with peripheral neuropathy, and educated on compensatory strategies including only ambulating in well lit areas and use of bil AFOs for foot drop.  Pt verbalized understanding.   PT Treatment/Interventions ADLs/Self Care Home Management;Neuromuscular re-education;Balance training;Therapeutic exercise;Therapeutic activities;Functional mobility training;Stair training;Gait training;DME Instruction;Patient/family education;Orthotic Fit/Training;Vestibular   PT Next Visit Plan d/c PT today      Patient will benefit from skilled therapeutic intervention in order to improve the following deficits and impairments:  Abnormal gait, Decreased balance, Difficulty walking, Decreased strength  Visit Diagnosis: Other abnormalities of gait and mobility  Muscle weakness (generalized)  Repeated falls       G-Codes - 05-13-2016 1555    Functional Assessment Tool Used BERG 46/56   Functional Limitation Mobility: Walking and moving around   Mobility: Walking and Moving Around Goal Status [phone]) At least 1 percent but less than 20 percent impaired, limited or restricted   Mobility: Walking and Moving Around Discharge Status [phone]) At least 1 percent but less than 20 percent impaired, limited or restricted      Problem  List Patient Active Problem List   Diagnosis Date Noted  . Foot drop, bilateral 05-13-13  . SDH (subdural hematoma) ([place]) 04/30/2013  . Polyneuropathy ([place]) 04/30/2013  . Skull fracture ([place]) 04/30/2013  . Thoracic or lumbosacral neuritis or radiculitis, unspecified 03/18/2012  . Hereditary and idiopathic peripheral neuropathy 03/18/2012  . Abnormality of gait 03/18/2012  . Concussion with no loss of consciousness 03/18/2012  . Other and unspecified hyperlipidemia 03/18/2012  . Unspecified essential hypertension 03/18/2012      Laureen Abrahams, PT, [name] 05-13-16 3:57 PM    [facility] [address] [place], [place], [address] Phone: [phone]   Fax:  [phone]  Name: Timothy Khan MRN: [id number] Date of Birth: 1931-05-29       PHYSICAL THERAPY DISCHARGE SUMMARY  Visits from Start of Care: 8  Current functional level related to goals / functional outcomes: See above   Remaining deficits: Continues to have decreased balance due to neuropathy; compensatory strategies discussed   Education / Equipment: bil foot up AFOs, fall prevention, HEP  Plan: Patient agrees to discharge.  Patient goals were met. Patient is being discharged due to meeting the stated rehab goals.  ?????       Laureen Abrahams, PT, [name] 05/02/16 3:57 PM   [facility] [address] [place], [place] [address]  [phone] (office) [phone] (fax)

## 2016-05-04 ENCOUNTER — Ambulatory Visit: Payer: 59 | Admitting: Physical Therapy

## 2016-12-04 ENCOUNTER — Encounter: Payer: Self-pay | Admitting: Neurology

## 2016-12-04 ENCOUNTER — Ambulatory Visit (INDEPENDENT_AMBULATORY_CARE_PROVIDER_SITE_OTHER): Payer: Medicare Other | Admitting: Neurology

## 2016-12-04 VITALS — BP 135/60 | HR 72 | Ht 70.0 in | Wt 173.5 lb

## 2016-12-04 DIAGNOSIS — G609 Hereditary and idiopathic neuropathy, unspecified: Secondary | ICD-10-CM | POA: Diagnosis not present

## 2016-12-04 DIAGNOSIS — R269 Unspecified abnormalities of gait and mobility: Secondary | ICD-10-CM | POA: Diagnosis not present

## 2016-12-04 DIAGNOSIS — M21372 Foot drop, left foot: Secondary | ICD-10-CM | POA: Diagnosis not present

## 2016-12-04 DIAGNOSIS — M21371 Foot drop, right foot: Secondary | ICD-10-CM

## 2016-12-04 MED ORDER — GABAPENTIN 300 MG PO CAPS
300.0000 mg | ORAL_CAPSULE | Freq: Every day | ORAL | 11 refills | Status: DC
Start: 2016-12-04 — End: 2017-12-04

## 2016-12-04 NOTE — Progress Notes (Signed)
Reason for visit: Peripheral neuropathy  Timothy Khan is an 83 y.o. male  History of present illness:  Timothy Khan is an 83 year old right-handed white male with history of a peripheral neuropathy associated with bilateral foot drops and a gait disorder. Since last seen, the patient has not had any falls, he has been using a cane for ambulation. The patient has AFO braces but they are PAFO braces that are uncomfortable and he does not wear them. He does have problems with his foot rolling sometimes if he is on uneven ground. The has undergone physical therapy for gait training which was somewhat beneficial. He has some discomfort in the evening hours, he takes only 300 mg of gabapentin at night and he sleeps fairly well. He denies any significant discomfort during the daytime. At times, he may have sharp pains in one of his toes or in the arch of the foot. He returns for an evaluation.    Past Medical History:  Diagnosis Date  . Abnormal brain MRI    Right side  . Basal cell carcinoma of back   . BPH (benign prostatic hyperplasia)   . Concussion 1950   Baseball injury  . Foot drop, bilateral 05/02/2013  . Gait disturbance   . Gastroesophageal reflux disease   . Hyperlipidemia   . Hypertension   . Lumbago   . Peripheral neuropathy   . Skull fracture [facility])     Past Surgical History:  Procedure Laterality Date  . CATARACT EXTRACTION Bilateral   . KNEE SURGERY Left   . skin cancer resection     basal cell, [place] carcinoma  . TONSILLECTOMY    . VASECTOMY      Family History  Problem Relation Age of Onset  . Cancer Mother        Breast cancer  . Heart attack Father   . Heart disease Brother     Social history:  reports that he quit smoking about 32 years ago. He has never used smokeless tobacco. He reports that he does not drink alcohol or use drugs.    Allergies  Allergen Reactions  . Codeine Nausea Only    Medications:  Prior to Admission medications     Medication Sig Start Date End Date Taking? Authorizing Provider  Cholecalciferol (VITAMIN D3) 2000 UNITS capsule Take 2,000 Units by mouth every other day.    Yes [provider]  Coenzyme Q10 (CO Q 10) 10 MG CAPS Take 1 tablet by mouth every morning.   Yes [provider]  Esomeprazole Magnesium (NEXIUM PO) Take 22.3 mg by mouth daily.   Yes [provider]  fexofenadine (ALLEGRA) 180 MG tablet Take 180 mg by mouth daily.   Yes [provider]  fluticasone (FLONASE) 50 MCG/ACT nasal spray Place 2 sprays into both nostrils at bedtime.  04/16/13  Yes [provider]  gabapentin (NEURONTIN) 300 MG capsule Take 1 capsule (300 mg total) by mouth at bedtime. 09/06/15  Yes Ward Givens, NP  losartan-hydrochlorothiazide (HYZAAR) 50-12.5 MG per tablet Take 1 tablet by mouth daily.  04/16/13  Yes [provider]  Multiple Vitamin (MULTIVITAMIN) capsule Take 1 capsule by mouth daily.   Yes [provider]  psyllium (METAMUCIL) 58.6 % packet Take 1 packet by mouth daily.   Yes [provider]  tamsulosin (FLOMAX) 0.4 MG CAPS Take 0.4 mg by mouth at bedtime.    Yes [provider]  traMADol (ULTRAM) 50 MG tablet Take 100 mg by  mouth daily.  10/01/12  Yes [provider]    ROS:  Out of a complete 14 system review of symptoms, the patient complains only of the following symptoms, and all other reviewed systems are negative.  Light sensitivity Frequency of urination, urinary urgency Memory loss Daytime drowsiness  Blood pressure 135/60, pulse 72, height 5\' 10"  (1.778 m), weight 173 lb 8 oz (78.7 kg).  Physical Exam  General: The patient is alert and cooperative at the time of the examination.  Skin: No significant peripheral edema is noted.   Neurologic Exam  Mental status: The patient is alert and oriented x 3 at the time of the examination. The patient has apparent normal recent and remote memory, with an  apparently normal attention span and concentration ability.   Cranial nerves: Facial symmetry is present. Speech is normal, no aphasia or dysarthria is noted. Extraocular movements are full. Visual fields are full.  Motor: The patient has good strength in all 4 extremities, with exception of bilateral foot drops.  Sensory examination: Soft touch sensation is symmetric on the face, arms, and legs. The patient has decreased sensation below the knees bilaterally.  Coordination: The patient has good finger-nose-finger and heel-to-shin bilaterally.  Gait and station: The patient has a wide-based, bilateral steppage gait. Tandem gait is unsteady. Romberg is positive, the patient falls backwards. No drift is seen.  Reflexes: Deep tendon reflexes are symmetric, but are depressed.   Assessment/Plan:  1. Peripheral neuropathy  2. Gait disturbance  3. Bilateral foot drops  The patient does have AFO braces but they are uncomfortable for him, I will rewrite a prescription so that he will get more lightweight and more comfortable braces that will help prevent foot drop and rotation of the ankles with walking. The patient is to be careful with driving, if he starts to feel uncomfortable with driving safely, conversion to hand controls may need to be considered. The patient will follow-up in one year, a prescription for gabapentin was given.  Timothy Alexanders MD 12/04/2016 2:40 PM  [facility] [address] [place], [place] [address]  Phone [phone] Fax [phone]

## 2017-03-23 ENCOUNTER — Other Ambulatory Visit: Payer: Self-pay | Admitting: Internal Medicine

## 2017-03-23 ENCOUNTER — Ambulatory Visit
Admission: RE | Admit: 2017-03-23 | Discharge: 2017-03-23 | Disposition: A | Discharge status: Home / Self Care | Payer: Medicare Other | Source: Ambulatory Visit | Attending: Internal Medicine | Admitting: Internal Medicine

## 2017-03-23 DIAGNOSIS — R140 Abdominal distension (gaseous): Secondary | ICD-10-CM

## 2017-05-30 ENCOUNTER — Ambulatory Visit: Payer: Medicare Other | Admitting: Allergy and Immunology

## 2017-08-15 ENCOUNTER — Encounter: Payer: Self-pay | Admitting: Podiatry

## 2017-08-15 ENCOUNTER — Ambulatory Visit: Payer: Medicare Other | Admitting: Podiatry

## 2017-08-15 DIAGNOSIS — L989 Disorder of the skin and subcutaneous tissue, unspecified: Secondary | ICD-10-CM

## 2017-08-19 NOTE — Progress Notes (Signed)
   Subjective: 84 year old male presenting today as a new patient with a chief complaint of painful callus lesions of bilateral feet, right worse than left. He has not done anything for treatment. Walking increases the pain. Patient is here for further evaluation and treatment.   Past Medical History:  Diagnosis Date  . Abnormal brain MRI    Right side  . Basal cell carcinoma of back   . BPH (benign prostatic hyperplasia)   . Concussion 1950   Baseball injury  . Foot drop, bilateral 05/02/2013  . Gait disturbance   . Gastroesophageal reflux disease   . Hyperlipidemia   . Hypertension   . Lumbago   . Peripheral neuropathy   . Skull fracture (HCC)      Objective:  Physical Exam General: Alert and oriented x3 in no acute distress  Dermatology: Hyperkeratotic lesion present on the bilateral feet x 4. Pain on palpation with a central nucleated core noted. Skin is warm, dry and supple bilateral lower extremities. Negative for open lesions or macerations.  Vascular: Palpable pedal pulses bilaterally. No edema or erythema noted. Capillary refill within normal limits.  Neurological: Epicritic and protective threshold grossly intact bilaterally.   Musculoskeletal Exam: Pain on palpation at the keratotic lesion noted. Range of motion within normal limits bilateral. Muscle strength 5/5 in all groups bilateral.  Assessment: 1. Pre-ulcerative callus lesions noted to bilateral feet x 4    Plan of Care:  1. Patient evaluated 2. Excisional debridement of keratoic lesion using a chisel blade was performed without incident.  3. Dressed area with light dressing. 4. Recommended good shoe gear.  5. Patient is to return to the clinic PRN.   Edrick Kins, DPM [facility]  Dr. Edrick Kins, [address]                                        [place], [place] [address]                Office [phone]  Fax [phone]

## 2017-12-04 ENCOUNTER — Ambulatory Visit: Payer: Medicare Other | Admitting: Neurology

## 2017-12-04 ENCOUNTER — Encounter: Payer: Self-pay | Admitting: Neurology

## 2017-12-04 VITALS — BP 129/58 | HR 76 | Ht 70.0 in | Wt 170.0 lb

## 2017-12-04 DIAGNOSIS — M21372 Foot drop, left foot: Secondary | ICD-10-CM

## 2017-12-04 DIAGNOSIS — R269 Unspecified abnormalities of gait and mobility: Secondary | ICD-10-CM

## 2017-12-04 DIAGNOSIS — G629 Polyneuropathy, unspecified: Secondary | ICD-10-CM

## 2017-12-04 DIAGNOSIS — M21371 Foot drop, right foot: Secondary | ICD-10-CM

## 2017-12-04 MED ORDER — GABAPENTIN 300 MG PO CAPS: 600.0000 mg | ORAL_CAPSULE | Freq: Every day | ORAL | 3 refills | Status: AC

## 2017-12-04 NOTE — Progress Notes (Signed)
Reason for visit: Peripheral neuropathy  Timothy Timothy Khan is an 84 y.o. male  History of present illness:  Mr. Timothy Timothy Khan is an 84 year old right-handed white male with a history of a peripheral neuropathy associate with a gait disorder.  The patient is having some discomfort in the feet at nighttime, he will have increased paresthesias and a throbbing sensation of his right great toe, this will last only several minutes and then dissipate.  He takes 600 mg of gabapentin at night, he recently increased from 300 mg.  The patient finds that this is helpful.  The patient was given a prescription for an AFO brace when last seen, but he never got this done.  The patient has had occasional falls, he uses a cane for ambulation.  He is having increasing problems with numbness in the hands, he will drop things on occasion.  He returns to this office for an evaluation.  Past Medical History:  Diagnosis Date  . Abnormal brain MRI    Right side  . Basal cell carcinoma of back   . BPH (benign prostatic hyperplasia)   . Concussion 1950   Baseball injury  . Foot drop, bilateral 05/02/2013  . Gait disturbance   . Gastroesophageal reflux disease   . Hyperlipidemia   . Hypertension   . Lumbago   . Peripheral neuropathy   . Skull fracture [facility])     Past Surgical History:  Procedure Laterality Date  . CATARACT EXTRACTION Bilateral   . KNEE SURGERY Left   . skin cancer resection     basal cell, [place] carcinoma  . TONSILLECTOMY    . VASECTOMY      Family History  Problem Relation Age of Onset  . Cancer Mother        Breast cancer  . Heart attack Father   . Heart disease Brother     Social history:  reports that he quit smoking about 33 years ago. He has never used smokeless tobacco. He reports that he does not drink alcohol or use drugs.    Allergies  Allergen Reactions  . Codeine Nausea Only    Medications:  Prior to Admission medications   Medication Sig Start Date End Date  Taking? Authorizing Provider  cetirizine (ZYRTEC) 10 MG tablet Take 10 mg by mouth daily.   Yes [provider]  Cholecalciferol (VITAMIN D3) 2000 UNITS capsule Take 2,000 Units by mouth every other day.    Yes [provider]  Coenzyme Q10 (CO Q 10) 10 MG CAPS Take 1 tablet by mouth every morning.   Yes [provider]  Esomeprazole Magnesium (NEXIUM PO) Take 22.3 mg by mouth daily.   Yes [provider]  fluticasone (FLONASE) 50 MCG/ACT nasal spray Place 2 sprays into both nostrils at bedtime.  04/16/13  Yes [provider]  gabapentin (NEURONTIN) 300 MG capsule Take 1 capsule (300 mg total) by mouth at bedtime. 12/04/16  Yes Kathrynn Ducking, MD  hydrochlorothiazide (MICROZIDE) 12.5 MG capsule Take 12.5 mg by mouth daily.  05/22/17  Yes [provider]  losartan (COZAAR) 50 MG tablet Take 50 mg by mouth daily.  05/22/17  Yes [provider]  Multiple Vitamin (MULTIVITAMIN) capsule Take 1 capsule by mouth daily.   Yes [provider]  psyllium (METAMUCIL) 58.6 % packet Take 1 packet by mouth daily.   Yes [provider]  tamsulosin (FLOMAX) 0.4 MG CAPS Take 0.4 mg by mouth at bedtime.    Yes [provider]  traMADol (ULTRAM) 50 MG tablet Take 100 mg by mouth daily.  10/01/12  Yes [provider]  triamcinolone cream (KENALOG) 0.1 %  05/10/17  Yes [provider]    ROS:  Out of a complete 14 system review of symptoms, the patient complains only of the following symptoms, and all other reviewed systems are negative.  Runny nose Frequency of urination Environmental allergies Numbness  Blood pressure (!) 129/58, pulse 76, height 5\' 10"  (1.778 m), weight 170 lb (77.1 kg), SpO2 98 %.  Physical Exam  General: The patient is alert and cooperative at the time of the examination.  Skin: No significant peripheral edema is noted.   Neurologic Exam  Mental status: The patient is alert and  oriented x 3 at the time of the examination. The patient has apparent normal recent and remote memory, with an apparently normal attention span and concentration ability.   Cranial nerves: Facial symmetry is present. Speech is normal, no aphasia or dysarthria is noted. Extraocular movements are full. Visual fields are full.  Motor: The patient has good strength in all 4 extremities, with exception of bilateral foot drops..  Sensory examination: Soft touch sensation is symmetric on the face, arms, and legs.  The patient has significant decreased sensation in the distal half of the legs bilaterally.  Coordination: The patient has good finger-nose-finger and heel-to-shin bilaterally.  Gait and station: The patient has a slightly wide-based, unsteady gait.  Patient normally uses a cane for ambulation.  He is unable to perform tandem gait.  Romberg is positive.  Reflexes: Deep tendon reflexes are symmetric, but are depressed.   Assessment/Plan:  1.  Peripheral neuropathy  2.  Gait disorder  3.  Bilateral foot drops  The patient was given another prescription for an AFO brace for both feet.  The patient will increase the gabapentin to 600 mg at night.  A prescription was given.  He will follow-up in 1 year.  Jill Alexanders MD 12/04/2017 2:46 PM  [facility] [address] [place], [place] [address]  Phone [phone] Fax [phone]

## 2017-12-19 ENCOUNTER — Telehealth: Payer: Self-pay | Admitting: *Deleted

## 2017-12-19 NOTE — Telephone Encounter (Signed)
Faxed signed order to [employer] re: AFO braces for right and left foot drop. Fax: [phone]. Received fax confirmation.

## 2018-03-06 ENCOUNTER — Ambulatory Visit (INDEPENDENT_AMBULATORY_CARE_PROVIDER_SITE_OTHER): Payer: Medicare Other | Admitting: Podiatry

## 2018-03-06 ENCOUNTER — Telehealth: Payer: Self-pay | Admitting: *Deleted

## 2018-03-06 DIAGNOSIS — M2042 Other hammer toe(s) (acquired), left foot: Secondary | ICD-10-CM | POA: Diagnosis not present

## 2018-03-06 DIAGNOSIS — L989 Disorder of the skin and subcutaneous tissue, unspecified: Secondary | ICD-10-CM | POA: Diagnosis not present

## 2018-03-06 NOTE — Telephone Encounter (Signed)
Pt states he was told to come to the office today at 11:45am by the nurse. I reviewed my Telephone Call past messages, order forums and I did not have any communications with pt. LOV 07/2017. A. Horton - Scheduler will inform pt and see if she can schedule with a doctor in-office today.

## 2018-03-10 NOTE — Progress Notes (Signed)
   HPI: 84 year old male presenting today with a chief complaint of a wound to the left fifth toe that appeared about one week ago. He states his new brace rubs the toe and pushes into the toe causing the wound. He reports associated pain. He has not done anything for treatment. Patient is here for further evaluation and treatment.   Past Medical History:  Diagnosis Date  . Abnormal brain MRI    Right side  . Basal cell carcinoma of back   . BPH (benign prostatic hyperplasia)   . Concussion 1950   Baseball injury  . Foot drop, bilateral 05/02/2013  . Gait disturbance   . Gastroesophageal reflux disease   . Hyperlipidemia   . Hypertension   . Lumbago   . Peripheral neuropathy   . Skull fracture (HCC)       Objective: Physical Exam General: The patient is alert and oriented x3 in no acute distress.  Dermatology: Hyperkeratotic lesion present on the left fifth toe. Pain on palpation with a central nucleated core noted. Skin is cool, dry and supple bilateral lower extremities. Negative for open lesions or macerations.  Vascular: Palpable pedal pulses bilaterally. No edema or erythema noted. Capillary refill within normal limits.  Neurological: Epicritic and protective threshold grossly intact bilaterally.   Musculoskeletal Exam: All pedal and ankle joints range of motion within normal limits bilateral. Muscle strength 5/5 in all groups bilateral. Hammertoe contracture deformity noted to the 5th digit of the left foot  Assessment: 1.  Hammertoe left fifth toe with overlying pre-ulcerative callus    Plan of Care:  1. Patient evaluated.  2. Excisional debridement of keratoic lesion using a chisel blade was performed without incident. Light dressing applied.  3. Silicone toe cap dispensed.  4. Recommended wide fitting shoes.  5. Return to clinic as needed.      Edrick Kins, DPM [facility]  Dr. Edrick Kins, DPM    [address],  [address]                Office [phone]  Fax [phone]

## 2018-04-11 ENCOUNTER — Other Ambulatory Visit: Payer: Self-pay

## 2018-04-11 ENCOUNTER — Ambulatory Visit: Payer: Medicare Other | Attending: Internal Medicine | Admitting: Rehabilitation

## 2018-04-11 ENCOUNTER — Encounter: Payer: Self-pay | Admitting: Rehabilitation

## 2018-04-11 DIAGNOSIS — M6281 Muscle weakness (generalized): Secondary | ICD-10-CM

## 2018-04-11 DIAGNOSIS — R296 Repeated falls: Secondary | ICD-10-CM

## 2018-04-11 DIAGNOSIS — R208 Other disturbances of skin sensation: Secondary | ICD-10-CM

## 2018-04-11 DIAGNOSIS — R2681 Unsteadiness on feet: Secondary | ICD-10-CM | POA: Diagnosis not present

## 2018-04-11 DIAGNOSIS — R2689 Other abnormalities of gait and mobility: Secondary | ICD-10-CM | POA: Diagnosis present

## 2018-04-11 NOTE — Therapy (Signed)
[facility] [address] [place], [place], [address] Phone: [phone]   Fax:  [phone]  Physical Therapy Evaluation  Patient Details  Name: Timothy Khan MRN: [id number] Date of Birth: [birth year] Referring Provider (PT): [name], MD   Encounter Date: 04/11/2018  PT End of Session - 04/11/18 1507    Visit Number  1    Number of Visits  17    Date for PT Re-Evaluation  30/07/62   cert written for 90 days, POC for 60 days   Authorization Type  [employer] Medicare    PT Start Time  1351    PT Stop Time  1445    PT Time Calculation (min)  54 min    Equipment Utilized During Treatment  Gait belt;Other (comment)   B AFOs   Activity Tolerance  Patient tolerated treatment well    Behavior During Therapy  WFL for tasks assessed/performed       Past Medical History:  Diagnosis Date  . Abnormal brain MRI    Right side  . Basal cell carcinoma of back   . BPH (benign prostatic hyperplasia)   . Concussion 1950   Baseball injury  . Foot drop, bilateral 05/02/2013  . Gait disturbance   . Gastroesophageal reflux disease   . Hyperlipidemia   . Hypertension   . Lumbago   . Peripheral neuropathy   . Skull fracture [facility])     Past Surgical History:  Procedure Laterality Date  . CATARACT EXTRACTION Bilateral   . KNEE SURGERY Left   . skin cancer resection     basal cell, [place] carcinoma  . TONSILLECTOMY    . VASECTOMY      There were no vitals filed for this visit.   Subjective Assessment - 04/11/18 1354    Subjective  Pt reports, "The last Saturday in December, I had a fall.  I banged up my left L shoulder and my knee/toe.  I have been to the ortho MD and they don't think it needs surgery or anything.  I have been getting therapy for the shoulder.  I had another fall and I hurt my back. I got to the doctor about that tomorrow.     Pertinent History  Jan 14th was last fall with back injury (sees MD 1/24).       Limitations  Standing;Walking    How long can you walk comfortably?  Reports he can walk a while, but is wobbly.     Patient Stated Goals  "to improve my balance"    Currently in Pain?  Yes    Pain Score  2     Pain Location  Back    Pain Orientation  Right;Medial    Pain Descriptors / Indicators  Aching    Pain Type  Acute pain    Pain Onset  1 to 4 weeks ago    Pain Frequency  Intermittent    Aggravating Factors   rolling to the right side    Pain Relieving Factors  getting in recliner a certain          [facility] PT Assessment - 04/11/18 1404      Assessment   Medical Diagnosis  neuropathy, falls    Referring Provider (PT)  [name], MD   declining balance since fall in 2015   Prior Therapy  Had OP PT here       Precautions   Precautions  Fall    Precaution Comments  hx of peripheral neuropathy, awaiting MD visit for back pain (04/12/18)    Required Braces or Orthoses  Other Brace/Splint    Other Brace/Splint  Has B AFOs (PLS) due to foot drop from neuropathy      Balance Screen   Has the patient fallen in the past 6 months  Yes    How many times?  multiple 2 falls since Dec    Has the patient had a decrease in activity level because of a fear of falling?   Yes    Is the patient reluctant to leave their home because of a fear of falling?   Yes      [place]  Private residence    Living Arrangements  Spouse/significant other    Available Help at Discharge  Family;Personal care attendant;Available PRN/intermittently   Has HH RN 3 days/wk, 3 kids in the area    Type of [place] to enter    Entrance Stairs-Number of Steps  5    Entrance Stairs-Rails  Left    Home Layout  Two level;Able to live on main level with bedroom/bathroom    Home Equipment  --   walking stick with quad tip      Prior Function   Level of Independence  Independent with basic ADLs   was doing cooking and cleaning before these last two falls     Vocation  Retired    Leisure  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      Cognition   Overall Cognitive Status  Impaired/Different from baseline   reports some word finding difficulties   Area of Impairment  Memory      Sensation   Light Touch  Impaired Detail    Light Touch Impaired Details  Impaired RLE;Impaired LLE;Impaired RUE;Impaired LUE   neuropathy from knees down, B hands   Stereognosis  Impaired by gross assessment    Hot/Cold  Impaired Detail    Hot/Cold Impaired Details  Impaired RUE;Impaired LUE;Impaired RLE;Impaired LLE      Coordination   Gross Motor Movements are Fluid and Coordinated  No    Fine Motor Movements are Fluid and Coordinated  No    Coordination and Movement Description  decreased due to strength deficits    Heel Shin Test  slow but no dysmetria      ROM / Strength   AROM / PROM / Strength  Strength      Strength   Overall Strength  Deficits    Overall Strength Comments  B hip flex 4/5, B knee flex/ext 5/5, B ankle DF 3-/5      Transfers   Transfers  Sit to Stand;Stand to Sit    Sit to Stand  5: Supervision    Sit to Stand Details  Verbal cues for sequencing;Verbal cues for technique    Sit to Stand Details (indicate cue type and reason)  Tends to push legs on back on mat, therefore provided cues for sitting at [facility] and also note pt tends to stand up locking knees and has posterior weight shift on heels    Five time sit to stand comments   16.09 secs without UE support     Stand to Sit  5: Supervision    Stand to Sit Details (indicate cue type and reason)  Verbal cues for sequencing;Verbal cues for technique      Ambulation/Gait   Ambulation/Gait  Yes  Ambulation/Gait Assistance  5: Supervision;4: Min guard    Ambulation/Gait Assistance Details  Pt ambulatory into clinic with walking stick with quad tip attached for improved stability.  Note that he had 2 mild LOB while walking back to treatment area but was able to use cane to stabilize.       Ambulation Distance (Feet)  300 Feet    Assistive device  --   walking stick with quad tip    Gait Pattern  Step-through pattern;Decreased stride length;Decreased dorsiflexion - right;Decreased dorsiflexion - left;Lateral hip instability;Trunk flexed;Wide base of support    Ambulation Surface  Level;Indoor    Gait velocity  2.62 ft/sec with walking stick with quad tip     Stairs  Yes    Stairs Assistance  5: Supervision    Stairs Assistance Details (indicate cue type and reason)  S for safety     Stair Management Technique  One rail Right;Alternating pattern;Forwards;With cane    Number of Stairs  4    Height of Stairs  6      Standardized Balance Assessment   Standardized Balance Assessment  Dynamic Gait Index      Dynamic Gait Index   Level Surface  Mild Impairment    Change in Gait Speed  Mild Impairment    Gait with Horizontal Head Turns  Moderate Impairment    Gait with Vertical Head Turns  Mild Impairment    Gait and Pivot Turn  Mild Impairment    Step Over Obstacle  Mild Impairment    Step Around Obstacles  Mild Impairment    Steps  Mild Impairment    Total Score  15    DGI comment:  Score of 19 or less are predictive of falls in older community living adults.                 Objective measurements completed on examination: See above findings.              PT Education - 04/11/18 1505    Education Details  evaluation findings, POC, goals, ensuring that he gets proper shoe wear to accomodate place on L 5th toe-importance of checking skin often to ensure it is not getting worse.     Person(s) Educated  Patient;Child(ren)    Methods  Explanation    Comprehension  Verbalized understanding       PT Short Term Goals - 04/11/18 1517      PT SHORT TERM GOAL #1   Title  Pt will initiate HEP in order to indicate decreased fall risk and improved functional mobility.  (Target Date: 05/11/18)    Time  4    Period  Weeks    Status  New    Target Date   05/11/18      PT SHORT TERM GOAL #2   Title  Pt will improve DGI to >/=17/24 in order to indicate decreased fall risk.     Time  4    Period  Weeks    Status  New      PT SHORT TERM GOAL #3   Title  Pt will perform 5TSS in </=13 secs without UE support with decreased posterior bias to indicate decreased fall risk.     Time  4    Period  Weeks    Status  New      PT SHORT TERM GOAL #4   Title  Pt will ambulate x 200' over indoor surfaces w/ LRAD, negotiating turns and through  tight spaces without overt LOB in order to indicate safe home negotiation.     Time  4    Period  Weeks    Status  New        PT Long Term Goals - 04/11/18 1524      PT LONG TERM GOAL #1   Title  Pt will be independent with final HEP in order to indicate decreased fall risk and imroved functional mobility. (Target Date: 06/10/18)    Time  8    Period  Weeks    Status  New    Target Date  06/10/18      PT LONG TERM GOAL #2   Title  Pt will improve DGI to >/=19/24 in order to indicate decreased fall risk.     Time  8    Period  Weeks    Status  New      PT LONG TERM GOAL #3   Title  Pt will improve 5TSS to </=11 secs without UE support without posterior bias in order to indicate decreased fall risk.      Time  8    Period  Weeks    Status  New      PT LONG TERM GOAL #4   Title  Pt will improve gait sped to >/=3.22 ft/sec w/ LRAD at mod I level in order to indicate decreased fall risk and improved efficiency of gait.     Time  8    Period  Weeks    Status  New      PT LONG TERM GOAL #5   Title  Pt will ambulate over unlevel paved outdoor surfaces (ramp/curb) x 500' w/ LRAD while scanning environment at mod I level in order to indicate safe community ambulation.     Time  8    Period  Weeks    Status  New             Plan - 04/11/18 1507    Clinical Impression Statement  Pt presents with B LE neuropathy with B PLS AFOs due to foot drop with history of HTN, HLD, and a fall in 2015 with  SDH.  Note two recent falls (one in Dec and one last week), both of which he reports he was turning or bending down (in darker room) and lost balance backwards.  During last fall, he did injure low back and has appt with Dr. Lynann Bologna tomorrow 1/24 to assess for any injury.  Will follow up on this as appropriate.  Note that pt has been to this clinic in the past for balance/vestibular deficits.  Upon PT evaluation, note generalized weakness (more so in ankles), 5TSS of 16.09 secs without UE support with bias posterior on heels indicative of elevated fall risk and decreased functional strength, gait speed of 2.62 ft/sec with walking stick with quad tip but with unsteadiness noted that could lead to LOB, and DGI score of 15/24 indicative of elevated fall risk. Pt will benefit from skilled OP neuro in order to address deficits.      History and Personal Factors relevant to plan of care:  see above, also has wife with cognitive issues-is getting [facility] RN to assist    Clinical Presentation  Evolving    Clinical Presentation due to:  see above    Clinical Decision Making  Moderate    Rehab Potential  Good    Clinical Impairments Affecting Rehab Potential  motivated to improve balance, unknown injury from fall?  PT Frequency  2x / week    PT Duration  8 weeks    PT Treatment/Interventions  ADLs/Self Care Home Management;DME Instruction;Gait training;Stair training;Functional mobility training;Therapeutic activities;Therapeutic exercise;Balance training;Neuromuscular re-education;Patient/family education;Orthotic Fit/Training;Passive range of motion;Vestibular    PT Next Visit Plan  See what Dr. Lynann Bologna said about back injury from fall, try regular SPC with quad tip, issue HEP for balance-EC, compliant surfaces, narrow BOS; needs to work on making safe turns, walking with head turns    Consulted and Agree with Plan of Care  Patient;Family member/caregiver    Family Member Consulted  Son       Patient will  benefit from skilled therapeutic intervention in order to improve the following deficits and impairments:  Decreased balance, Abnormal gait, Decreased coordination, Decreased knowledge of precautions, Decreased knowledge of use of DME, Decreased mobility, Decreased safety awareness, Decreased strength, Dizziness, Impaired perceived functional ability, Impaired flexibility, Impaired sensation, Postural dysfunction  Visit Diagnosis: Unsteadiness on feet  Repeated falls  Muscle weakness (generalized)  Other abnormalities of gait and mobility  Other disturbances of skin sensation     Problem List Patient Active Problem List   Diagnosis Date Noted  . Foot drop, bilateral 05/02/2013  . SDH (subdural hematoma) ([place]) 04/30/2013  . Polyneuropathy 04/30/2013  . Skull fracture ([place]) 04/30/2013  . Thoracic or lumbosacral neuritis or radiculitis, unspecified 03/18/2012  . Hereditary and idiopathic peripheral neuropathy 03/18/2012  . Abnormality of gait 03/18/2012  . Concussion with no loss of consciousness 03/18/2012  . Other and unspecified hyperlipidemia 03/18/2012  . Unspecified essential hypertension 03/18/2012    Cameron Sprang, PT, MPT [facility] [address] [place], [place], [address] Phone: [phone]   Fax:  [phone] 04/11/18, 3:31 PM  Name: Timothy Khan MRN: [id number] Date of Birth: 20-Apr-1931

## 2018-04-11 NOTE — Addendum Note (Signed)
Addended by: Cameron Sprang A on: 04/11/2018 03:32 PM   Modules accepted: Orders

## 2018-04-18 ENCOUNTER — Encounter: Payer: Self-pay | Admitting: Rehabilitation

## 2018-04-18 ENCOUNTER — Ambulatory Visit: Payer: Medicare Other | Admitting: Rehabilitation

## 2018-04-18 DIAGNOSIS — M6281 Muscle weakness (generalized): Secondary | ICD-10-CM

## 2018-04-18 DIAGNOSIS — R2681 Unsteadiness on feet: Secondary | ICD-10-CM

## 2018-04-18 DIAGNOSIS — R208 Other disturbances of skin sensation: Secondary | ICD-10-CM

## 2018-04-18 DIAGNOSIS — R2689 Other abnormalities of gait and mobility: Secondary | ICD-10-CM

## 2018-04-18 DIAGNOSIS — R296 Repeated falls: Secondary | ICD-10-CM

## 2018-04-18 NOTE — Therapy (Signed)
[facility] [address] [place], [place], [address] Phone: [phone]   Fax:  [phone]  Physical Therapy Treatment  Patient Details  Name: Timothy Khan MRN: [id number] Date of Birth: 11/14/1931 Referring Provider (PT): [name], MD (declining balance since fall in 2015)   Encounter Date: 04/18/2018  PT End of Session - 04/18/18 0902    Visit Number  2    Number of Visits  17    Date for PT Re-Evaluation  62/94/76   cert written for 90 days, POC for 60 days   Authorization Type  [employer] Medicare    PT Start Time  [phone]    PT Stop Time  0930    PT Time Calculation (min)  43 min    Equipment Utilized During Treatment  Gait belt;Other (comment)   B AFOs   Activity Tolerance  Patient tolerated treatment well    Behavior During Therapy  WFL for tasks assessed/performed       Past Medical History:  Diagnosis Date  . Abnormal brain MRI    Right side  . Basal cell carcinoma of back   . BPH (benign prostatic hyperplasia)   . Concussion 1950   Baseball injury  . Foot drop, bilateral 05/02/2013  . Gait disturbance   . Gastroesophageal reflux disease   . Hyperlipidemia   . Hypertension   . Lumbago   . Peripheral neuropathy   . Skull fracture [facility])     Past Surgical History:  Procedure Laterality Date  . CATARACT EXTRACTION Bilateral   . KNEE SURGERY Left   . skin cancer resection     basal cell, [place] carcinoma  . TONSILLECTOMY    . VASECTOMY      There were no vitals filed for this visit.  Subjective Assessment - 04/18/18 0851    Subjective  Pt reports that he went to Dr. Lynann Bologna.  Doing an MRI tomorrow 1/31 because there did seem to be a hairline fracture, but couldn't tell if it was acute or chronic.  Did not give any precautions     Pertinent History  Jan 14th was last fall with back injury (MRI 1/31).      How long can you walk comfortably?  Reports he can walk a while, but is wobbly.     Patient Stated Goals  "to improve my balance"    Currently in Pain?  Yes    Pain Score  2     Pain Location  Back    Pain Orientation  Right;Medial    Pain Descriptors / Indicators  Aching    Pain Type  Acute pain    Pain Onset  1 to 4 weeks ago    Pain Frequency  Intermittent    Aggravating Factors   rolling to the right side    Pain Relieving Factors  getting in recliner in a certain way.                        [facility] Adult PT Treatment/Exercise - 04/18/18 0902      Ambulation/Gait   Ambulation/Gait  Yes    Ambulation/Gait Assistance  5: Supervision    Ambulation/Gait Assistance Details  Trialed use of regular SPC with use of quad tip on the end for better leverage and UE placement to carryover to improved safety.  Did note improvement in balance, however he continues to be somewhat unsteady and note that he tends to get very close  to objects, having to place cane between legs on chairs, close to other objects.  Provided cues to both pt and caregiver during session about either PT getting order for a [facility] and transferring his quad tip or him just seeing if he has a cane around the house that he can transfer tip to.  Both verbalized understanding.  Also discused that it would likely be safer when in community (esp busier areas) using RW to prevent falls or having to "throw" himself to a stable surface to gain balance.      Ambulation Distance (Feet)  330 Feet    Assistive device  Straight cane   with quad tip    Gait Pattern  Step-through pattern;Decreased stride length;Decreased dorsiflexion - right;Decreased dorsiflexion - left;Lateral hip instability;Trunk flexed;Wide base of support    Ambulation Surface  Level;Indoor      Self-Care   Self-Care  Other Self-Care Comments    Other Self-Care Comments   Discussed using RW as mentioned in gait section and getting SPC and adding his quad tip to that cane.  Also educated on balance systems and strategies and how exercises given  will help compensate for decreased sensation in LEs due to neuropathy and weaker ankles.  Pt and caregiver verbalized understanding.  Also provided education on ensuring that when he follows up with MD next week he gets clearance to continue PT and also be informed of any restrictions/precautions he may have.  Pt and caregiver verbalized understanding.       Neuro Re-ed    Neuro Re-ed Details   See pt instruction for balance HEP given/performed during session.           Stand in corner with a chair in front of you for safety.   Feet Apart, Head Motion - Eyes Open    With eyes open, feet apart, move head slowly: up and down x 10 reps and side to side x 10 reps.  Repeat __1__ times per session. Do __1-2__ sessions per day.  Copyright  VHI. All rights reserved.   Feet Together, Varied Arm Positions - Eyes Open    With eyes open, feet together, arms out, look straight ahead at a stationary object. Hold __20_ seconds. Repeat __3__ times per session. Do _1-2___ sessions per day.  Copyright  VHI. All rights reserved.   http://orth.exer.us/38    FUNCTIONAL MOBILITY: Toe Walking   Walk forward on toes x 10 steps 4 reps per set, 1 sets per day, 5 days per week Use assistive device.    DORSIFLEXION STRENGTHENING:   http://orth.exer.us/42   FUNCTIONAL MOBILITY: Heel Walking   Walk forward on heels holding on to countertop x 10 steps  4 reps per set, 1 sets per day, 5 days per week Use assistive device (cane) if needed along with counter top.      Feet Partial Heel-Toe, Arm Motion - Eyes Open    With eyes open, right foot partially in front of the other, keep arms by your side.   Repeat __3__ times per session for 20 secs each. Do _1-2___ sessions per day.  Copyright  VHI. All rights reserved.   Weight Shift: Anterior / Posterior (Righting / Equilibrium)    BEGIN WITH BACK LEANING AGAINST THE WALL AND FEET 4-5 INCHES AWAY. Move your hips off the  wall and come to upright standing.  Hold for 5 seconds. Return slowly to the wall letting your hips bump the wall and return to stand.   Hold each position  _5_ seconds. Repeat _10__ times per session. Do _1-2__ sessions per day.     PT Education - 04/18/18 1244    Education Details  see self care     Person(s) Educated  Patient;Caregiver(s)    Methods  Explanation;Demonstration;Handout    Comprehension  Verbalized understanding       PT Short Term Goals - 04/11/18 1517      PT SHORT TERM GOAL #1   Title  Pt will initiate HEP in order to indicate decreased fall risk and improved functional mobility.  (Target Date: 05/11/18)    Time  4    Period  Weeks    Status  New    Target Date  05/11/18      PT SHORT TERM GOAL #2   Title  Pt will improve DGI to >/=17/24 in order to indicate decreased fall risk.     Time  4    Period  Weeks    Status  New      PT SHORT TERM GOAL #3   Title  Pt will perform 5TSS in </=13 secs without UE support with decreased posterior bias to indicate decreased fall risk.     Time  4    Period  Weeks    Status  New      PT SHORT TERM GOAL #4   Title  Pt will ambulate x 200' over indoor surfaces w/ LRAD, negotiating turns and through tight spaces without overt LOB in order to indicate safe home negotiation.     Time  4    Period  Weeks    Status  New        PT Long Term Goals - 04/11/18 1524      PT LONG TERM GOAL #1   Title  Pt will be independent with final HEP in order to indicate decreased fall risk and imroved functional mobility. (Target Date: 06/10/18)    Time  8    Period  Weeks    Status  New    Target Date  06/10/18      PT LONG TERM GOAL #2   Title  Pt will improve DGI to >/=19/24 in order to indicate decreased fall risk.     Time  8    Period  Weeks    Status  New      PT LONG TERM GOAL #3   Title  Pt will improve 5TSS to </=11 secs without UE support without posterior bias in order to indicate decreased fall risk.      Time   8    Period  Weeks    Status  New      PT LONG TERM GOAL #4   Title  Pt will improve gait sped to >/=3.22 ft/sec w/ LRAD at mod I level in order to indicate decreased fall risk and improved efficiency of gait.     Time  8    Period  Weeks    Status  New      PT LONG TERM GOAL #5   Title  Pt will ambulate over unlevel paved outdoor surfaces (ramp/curb) x 500' w/ LRAD while scanning environment at mod I level in order to indicate safe community ambulation.     Time  8    Period  Weeks    Status  New            Plan - 04/18/18 1245    Clinical Impression Statement  Skilled session focused on using  SPC with quad tip to see if this would provide more stability for pt vs his walking stick with quad tip.  It does provide improved stability, but still feel that he may need RW in community setting (esp busy areas) to avoid LOB.  Also initiated HEP for corner and counter top balance.  See pt instructions.     Rehab Potential  Good    Clinical Impairments Affecting Rehab Potential  motivated to improve balance, unknown injury from fall?    PT Frequency  2x / week    PT Duration  8 weeks    PT Treatment/Interventions  ADLs/Self Care Home Management;DME Instruction;Gait training;Stair training;Functional mobility training;Therapeutic activities;Therapeutic exercise;Balance training;Neuromuscular re-education;Patient/family education;Orthotic Fit/Training;Passive range of motion;Vestibular    PT Next Visit Plan  See what Dr. Lynann Bologna said about back injury from fall (appt 2/7) , continue SPC with quad tip and RW in busy areas, check on compliance with HEP-add as needed; needs to work on making safe turns, walking with head turns    Consulted and Agree with Plan of Care  Patient;Family member/caregiver    Family Member Consulted  Son       Patient will benefit from skilled therapeutic intervention in order to improve the following deficits and impairments:  Decreased balance, Abnormal gait,  Decreased coordination, Decreased knowledge of precautions, Decreased knowledge of use of DME, Decreased mobility, Decreased safety awareness, Decreased strength, Dizziness, Impaired perceived functional ability, Impaired flexibility, Impaired sensation, Postural dysfunction  Visit Diagnosis: Unsteadiness on feet  Repeated falls  Muscle weakness (generalized)  Other abnormalities of gait and mobility  Other disturbances of skin sensation     Problem List Patient Active Problem List   Diagnosis Date Noted  . Foot drop, bilateral 05/02/2013  . SDH (subdural hematoma) ([place]) 04/30/2013  . Polyneuropathy 04/30/2013  . Skull fracture ([place]) 04/30/2013  . Thoracic or lumbosacral neuritis or radiculitis, unspecified 03/18/2012  . Hereditary and idiopathic peripheral neuropathy 03/18/2012  . Abnormality of gait 03/18/2012  . Concussion with no loss of consciousness 03/18/2012  . Other and unspecified hyperlipidemia 03/18/2012  . Unspecified essential hypertension 03/18/2012    Cameron Sprang, PT, MPT [facility] [address] [place], [place], [address] Phone: [phone]   Fax:  [phone] 04/18/18, 12:47 PM  Name: Timothy Khan MRN: [id number] Date of Birth: Feb 23, 1932

## 2018-04-18 NOTE — Patient Instructions (Signed)
Stand in corner with a chair in front of you for safety.   Feet Apart, Head Motion - Eyes Open    With eyes open, feet apart, move head slowly: up and down x 10 reps and side to side x 10 reps.  Repeat __1__ times per session. Do __1-2__ sessions per day.  Copyright  VHI. All rights reserved.   Feet Together, Varied Arm Positions - Eyes Open    With eyes open, feet together, arms out, look straight ahead at a stationary object. Hold __20_ seconds. Repeat __3__ times per session. Do _1-2___ sessions per day.  Copyright  VHI. All rights reserved.   http://orth.exer.us/38    FUNCTIONAL MOBILITY: Toe Walking   Walk forward on toes x 10 steps 4 reps per set, 1 sets per day, 5 days per week Use assistive device.    DORSIFLEXION STRENGTHENING:   http://orth.exer.us/42   FUNCTIONAL MOBILITY: Heel Walking   Walk forward on heels holding on to countertop x 10 steps  4 reps per set, 1 sets per day, 5 days per week Use assistive device (cane) if needed along with counter top.      Feet Partial Heel-Toe, Arm Motion - Eyes Open    With eyes open, right foot partially in front of the other, keep arms by your side.   Repeat __3__ times per session for 20 secs each. Do _1-2___ sessions per day.  Copyright  VHI. All rights reserved.   Weight Shift: Anterior / Posterior (Righting / Equilibrium)    BEGIN WITH BACK LEANING AGAINST THE WALL AND FEET 4-5 INCHES AWAY. Move your hips off the wall and come to upright standing.  Hold for 5 seconds. Return slowly to the wall letting your hips bump the wall and return to stand.   Hold each position _5_ seconds. Repeat _10__ times per session. Do _1-2__ sessions per day.

## 2018-04-26 ENCOUNTER — Ambulatory Visit: Payer: Medicare Other | Admitting: Physical Therapy

## 2018-05-02 ENCOUNTER — Encounter: Payer: Self-pay | Admitting: Rehabilitation

## 2018-05-02 ENCOUNTER — Ambulatory Visit: Payer: Medicare Other | Attending: Internal Medicine | Admitting: Rehabilitation

## 2018-05-02 DIAGNOSIS — R296 Repeated falls: Secondary | ICD-10-CM | POA: Insufficient documentation

## 2018-05-02 DIAGNOSIS — R208 Other disturbances of skin sensation: Secondary | ICD-10-CM | POA: Diagnosis present

## 2018-05-02 DIAGNOSIS — R2681 Unsteadiness on feet: Secondary | ICD-10-CM | POA: Diagnosis present

## 2018-05-02 DIAGNOSIS — R2689 Other abnormalities of gait and mobility: Secondary | ICD-10-CM

## 2018-05-02 DIAGNOSIS — M6281 Muscle weakness (generalized): Secondary | ICD-10-CM | POA: Diagnosis present

## 2018-05-02 NOTE — Therapy (Signed)
[facility] [address] [place], [place], [address] Phone: [phone]   Fax:  [phone]  Physical Therapy Treatment  Patient Details  Name: Timothy Khan MRN: [id number] Date of Birth: March 06, 1932 Referring Provider (PT): [name], MD (declining balance since fall in 2015)   Encounter Date: 05/02/2018  PT End of Session - 05/02/18 1033    Visit Number  3    Number of Visits  17    Date for PT Re-Evaluation  44/03/47   cert written for 90 days, POC for 60 days   Authorization Type  [employer] Medicare    PT Start Time  [phone]    PT Stop Time  0930    PT Time Calculation (min)  43 min    Equipment Utilized During Treatment  Gait belt;Other (comment)   B AFOs   Activity Tolerance  Patient tolerated treatment well    Behavior During Therapy  WFL for tasks assessed/performed       Past Medical History:  Diagnosis Date  . Abnormal brain MRI    Right side  . Basal cell carcinoma of back   . BPH (benign prostatic hyperplasia)   . Concussion 1950   Baseball injury  . Foot drop, bilateral 05/02/2013  . Gait disturbance   . Gastroesophageal reflux disease   . Hyperlipidemia   . Hypertension   . Lumbago   . Peripheral neuropathy   . Skull fracture [facility])     Past Surgical History:  Procedure Laterality Date  . CATARACT EXTRACTION Bilateral   . KNEE SURGERY Left   . skin cancer resection     basal cell, [place] carcinoma  . TONSILLECTOMY    . VASECTOMY      There were no vitals filed for this visit.  Subjective Assessment - 05/02/18 0849    Subjective  Pt reports he has new compression fracture L1.  Reports that it is healing, does not have any precautions. 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      Pertinent History  Jan 14th was last fall with back injury (MRI 1/31).      Limitations  Standing;Walking    How long can you walk comfortably?  Reports he can walk a while, but is wobbly.     Patient Stated Goals  "to improve my balance"    Currently in Pain?  Yes   no reported pain, however has pain with certain movements   Pain Score  0-No pain                       [facility] Adult PT Treatment/Exercise - 05/02/18 0855      Ambulation/Gait   Ambulation/Gait  Yes    Ambulation/Gait Assistance  4: Min guard    Ambulation/Gait Assistance Details  see below     Ambulation Distance (Feet)  50 Feet    Assistive device  Straight cane   his walking stick with quad tip    Gait Pattern  Step-through pattern;Decreased stride length;Decreased dorsiflexion - right;Decreased dorsiflexion - left;Lateral hip instability;Trunk flexed;Wide base of support    Ambulation Surface  Level;Indoor    Pre-Gait Activities  Performed gait around [place] in figure 8 pattern with use of his quad tip cane (has not gotten regular cane yet) in order to work on turns and obstacle negotation with proper stepping technique.  Pt tends to ambulate very close to object and then will pick up cane and hold off of ground.  Provided cues for getting further away and also ensuring that cane hits ground with L foot.  Continue to encourage him to use cane at all times in the house as furniture walking will not improve his balance and is unsafe. Pt and family member verbalized understanding.        High Level Balance   High Level Balance Comments  Reviewed HEP from previous session as he reports non-compliance.  See pt instruction as these are still appropriate.       Self-Care   Self-Care  Other Self-Care Comments    Other Self-Care Comments   Discussed if pt felt that he could commit to OP PT at this time due to many things going on personally for pt.  He reports that he would like to try and keep coming if possible.        Neuro Re-ed    Neuro Re-ed Details   For more high level balance to emphasize hip and stepping  strategy had pt on foam balance beam in // bars with intermittent single/BUE support (standing perpendicularly) moving trunk forward/hips backwards and then back upright to address hip strategy.  Pt cued to reduce ROM for more control and success with task.  Progressing to stepping forward alternating LEs x 10 reps, posteriorly x 10 reps each.   cued pt for RUE support only with these tasks to simulate use of cane.         Stand in corner with a chair in front of you for safety.   Feet Apart, Head Motion - Eyes Open    With eyes open, feet apart, move head slowly: up and down x 10 reps and side to side x 10 reps.  Repeat __1__ times per session. Do __1-2__ sessions per day.  Copyright  VHI. All rights reserved.   Feet Together, Varied Arm Positions - Eyes Open    With eyes open, feet together, arms out, look straight ahead at a stationary object. Hold __20_ seconds. Repeat __3__ times per session. Do _1-2___ sessions per day.  Copyright  VHI. All rights reserved.   http://orth.exer.us/38   FUNCTIONAL MOBILITY: Toe Walking   Walk forward on toes x 10 steps 4 reps per set, 1 sets per day, 5 days per week Use assistive device.    DORSIFLEXION STRENGTHENING:   http://orth.exer.us/42  FUNCTIONAL MOBILITY: Heel Walking   Walk forward on heels holding on to countertop x 10 steps 4 reps per set, 1 sets per day, 5 days per week Use assistive device (cane) if needed along with counter top.      Feet Partial Heel-Toe, Arm Motion - Eyes Open    With eyes open, right foot partially in front of the other, keep arms by your side.   Repeat __3__ times per session for 20 secs each. Do _1-2___ sessions per day.  Copyright  VHI. All rights reserved.   Weight Shift: Anterior / Posterior (Righting / Equilibrium)    BEGIN WITH BACK LEANING AGAINST THE WALL AND FEET 4-5 INCHES AWAY. Move your hips off the wall and come to upright standing.  Hold for 5  seconds. Return slowly to the wall letting your hips bump the wall and return to stand.   Hold each position _5_ seconds. Repeat _10__ times per session. Do _1-2__ sessions per day.      PT Education - 05/02/18 1033  Education Details  see self care, HEP     Person(s) Educated  Patient;Child(ren)    Methods  Explanation;Demonstration    Comprehension  Verbalized understanding;Returned demonstration       PT Short Term Goals - 04/11/18 1517      PT SHORT TERM GOAL #1   Title  Pt will initiate HEP in order to indicate decreased fall risk and improved functional mobility.  (Target Date: 05/11/18)    Time  4    Period  Weeks    Status  New    Target Date  05/11/18      PT SHORT TERM GOAL #2   Title  Pt will improve DGI to >/=17/24 in order to indicate decreased fall risk.     Time  4    Period  Weeks    Status  New      PT SHORT TERM GOAL #3   Title  Pt will perform 5TSS in </=13 secs without UE support with decreased posterior bias to indicate decreased fall risk.     Time  4    Period  Weeks    Status  New      PT SHORT TERM GOAL #4   Title  Pt will ambulate x 200' over indoor surfaces w/ LRAD, negotiating turns and through tight spaces without overt LOB in order to indicate safe home negotiation.     Time  4    Period  Weeks    Status  New        PT Long Term Goals - 04/11/18 1524      PT LONG TERM GOAL #1   Title  Pt will be independent with final HEP in order to indicate decreased fall risk and imroved functional mobility. (Target Date: 06/10/18)    Time  8    Period  Weeks    Status  New    Target Date  06/10/18      PT LONG TERM GOAL #2   Title  Pt will improve DGI to >/=19/24 in order to indicate decreased fall risk.     Time  8    Period  Weeks    Status  New      PT LONG TERM GOAL #3   Title  Pt will improve 5TSS to </=11 secs without UE support without posterior bias in order to indicate decreased fall risk.      Time  8    Period  Weeks     Status  New      PT LONG TERM GOAL #4   Title  Pt will improve gait sped to >/=3.22 ft/sec w/ LRAD at mod I level in order to indicate decreased fall risk and improved efficiency of gait.     Time  8    Period  Weeks    Status  New      PT LONG TERM GOAL #5   Title  Pt will ambulate over unlevel paved outdoor surfaces (ramp/curb) x 500' w/ LRAD while scanning environment at mod I level in order to indicate safe community ambulation.     Time  8    Period  Weeks    Status  New            Plan - 05/02/18 1034    Clinical Impression Statement  Skilled session focused on review of HEP to ensure still appropriate as he had a 2 week delay in care due to wife's medical issues.  Also  continued to work on high level balance with compliant surfaces and hip and stepping strategy.      Rehab Potential  Good    Clinical Impairments Affecting Rehab Potential  motivated to improve balance, unknown injury from fall?    PT Frequency  2x / week    PT Duration  8 weeks    PT Treatment/Interventions  ADLs/Self Care Home Management;DME Instruction;Gait training;Stair training;Functional mobility training;Therapeutic activities;Therapeutic exercise;Balance training;Neuromuscular re-education;Patient/family education;Orthotic Fit/Training;Passive range of motion;Vestibular    PT Next Visit Plan  Maybe look at gaze exercises from last episode of care?  continue [facility] with quad tip and RW in busy areas, check on compliance with HEP-add as needed; needs to work on making safe turns, walking with head turns    Consulted and Agree with Plan of Care  Patient;Family member/caregiver    Family Member Consulted  Son       Patient will benefit from skilled therapeutic intervention in order to improve the following deficits and impairments:  Decreased balance, Abnormal gait, Decreased coordination, Decreased knowledge of precautions, Decreased knowledge of use of DME, Decreased mobility, Decreased safety awareness,  Decreased strength, Dizziness, Impaired perceived functional ability, Impaired flexibility, Impaired sensation, Postural dysfunction  Visit Diagnosis: Unsteadiness on feet  Repeated falls  Muscle weakness (generalized)  Other abnormalities of gait and mobility  Other disturbances of skin sensation     Problem List Patient Active Problem List   Diagnosis Date Noted  . Foot drop, bilateral 05/02/2013  . SDH (subdural hematoma) ([place]) 04/30/2013  . Polyneuropathy 04/30/2013  . Skull fracture ([place]) 04/30/2013  . Thoracic or lumbosacral neuritis or radiculitis, unspecified 03/18/2012  . Hereditary and idiopathic peripheral neuropathy 03/18/2012  . Abnormality of gait 03/18/2012  . Concussion with no loss of consciousness 03/18/2012  . Other and unspecified hyperlipidemia 03/18/2012  . Unspecified essential hypertension 03/18/2012    Cameron Sprang, PT, MPT [facility] [address] [place], [place], [address] Phone: [phone]   Fax:  [phone] 05/02/18, 10:36 AM  Name: Timothy Khan MRN: [id number] Date of Birth: [birth year]

## 2018-05-02 NOTE — Patient Instructions (Signed)
Stand in corner with a chair in front of you for safety.   Feet Apart, Head Motion - Eyes Open    With eyes open, feet apart, move head slowly: up and down x 10 reps and side to side x 10 reps.  Repeat __1__ times per session. Do __1-2__ sessions per day.  Copyright  VHI. All rights reserved.   Feet Together, Varied Arm Positions - Eyes Open    With eyes open, feet together, arms out, look straight ahead at a stationary object. Hold __20_ seconds. Repeat __3__ times per session. Do _1-2___ sessions per day.  Copyright  VHI. All rights reserved.   http://orth.exer.us/38   FUNCTIONAL MOBILITY: Toe Walking   Walk forward on toes x 10 steps 4 reps per set, 1 sets per day, 5 days per week Use assistive device.    DORSIFLEXION STRENGTHENING:   http://orth.exer.us/42  FUNCTIONAL MOBILITY: Heel Walking   Walk forward on heels holding on to countertop x 10 steps 4 reps per set, 1 sets per day, 5 days per week Use assistive device (cane) if needed along with counter top.      Feet Partial Heel-Toe, Arm Motion - Eyes Open    With eyes open, right foot partially in front of the other, keep arms by your side.   Repeat __3__ times per session for 20 secs each. Do _1-2___ sessions per day.  Copyright  VHI. All rights reserved.   Weight Shift: Anterior / Posterior (Righting / Equilibrium)    BEGIN WITH BACK LEANING AGAINST THE WALL AND FEET 4-5 INCHES AWAY. Move your hips off the wall and come to upright standing.  Hold for 5 seconds. Return slowly to the wall letting your hips bump the wall and return to stand.   Hold each position _5_ seconds. Repeat _10__ times per session. Do _1-2__ sessions per day.

## 2018-05-03 ENCOUNTER — Ambulatory Visit: Payer: Medicare Other | Admitting: Rehabilitative and Restorative Service Providers"

## 2018-05-03 ENCOUNTER — Encounter: Payer: Self-pay | Admitting: Rehabilitative and Restorative Service Providers"

## 2018-05-03 DIAGNOSIS — R2681 Unsteadiness on feet: Secondary | ICD-10-CM | POA: Diagnosis not present

## 2018-05-03 DIAGNOSIS — R2689 Other abnormalities of gait and mobility: Secondary | ICD-10-CM

## 2018-05-03 DIAGNOSIS — M6281 Muscle weakness (generalized): Secondary | ICD-10-CM

## 2018-05-03 DIAGNOSIS — R296 Repeated falls: Secondary | ICD-10-CM

## 2018-05-03 NOTE — Therapy (Signed)
[facility] [address] [place], [place], [address] Phone: [phone]   Fax:  [phone]  Physical Therapy Treatment  Patient Details  Name: Timothy Khan MRN: [id number] Date of Birth: October 26, 1931 Referring Provider (PT): [name], MD (declining balance since fall in 2015)   Encounter Date: 05/03/2018  PT End of Session - 05/03/18 1619    Visit Number  4    Number of Visits  17    Date for PT Re-Evaluation  09/17/14   cert written for 90 days, POC for 60 days   Authorization Type  [employer] Medicare    PT Start Time  1320    PT Stop Time  1402    PT Time Calculation (min)  42 min    Equipment Utilized During Treatment  Gait belt;Other (comment)   B AFOs   Activity Tolerance  Patient tolerated treatment well    Behavior During Therapy  WFL for tasks assessed/performed       Past Medical History:  Diagnosis Date  . Abnormal brain MRI    Right side  . Basal cell carcinoma of back   . BPH (benign prostatic hyperplasia)   . Concussion 1950   Baseball injury  . Foot drop, bilateral 05/02/2013  . Gait disturbance   . Gastroesophageal reflux disease   . Hyperlipidemia   . Hypertension   . Lumbago   . Peripheral neuropathy   . Skull fracture [facility])     Past Surgical History:  Procedure Laterality Date  . CATARACT EXTRACTION Bilateral   . KNEE SURGERY Left   . skin cancer resection     basal cell, [place] carcinoma  . TONSILLECTOMY    . VASECTOMY      There were no vitals filed for this visit.  Subjective Assessment - 05/03/18 1320    Subjective  The patient wears bilat AFOs due to neuropathy.   He notes the compression fx is from a fall 04/02/18.  He notes he fall regularly.  He notes sometimes getting a kaleidoscope type visual sensation when looking up or down.      Pertinent History  Jan 14th was last fall with back injury (MRI 1/31).      Patient Stated Goals  "to improve my balance"    Currently  in Pain?  No/denies   certain movements aggravate pain   Pain Score  0-No pain    Pain Location  Back    Pain Descriptors / Indicators  Aching    Pain Onset  1 to 4 weeks ago    Pain Frequency  Intermittent    Aggravating Factors   rolling in bed    Pain Relieving Factors  rest                       [facility] Adult PT Treatment/Exercise - 05/03/18 1620      Ambulation/Gait   Ambulation/Gait  Yes    Ambulation/Gait Assistance  4: Min guard;5: Supervision    Ambulation/Gait Assistance Details  Walked into clinic [place] with use of walking stick with quad cane tip.  Worked in clinic with [facility] with quad tip and worked on [place].  Performed figure 8 turns working on visual spotting for compensatory strategies.     Ambulation Distance (Feet)  115 Feet   x 3 reps   Assistive device  Straight cane    Gait Pattern  Step-through pattern;Decreased stride length;Decreased dorsiflexion - right;Decreased dorsiflexion - left;Lateral hip instability;Trunk  flexed;Wide base of support    Ambulation Surface  Level;Indoor      Neuro Re-ed    Neuro Re-ed Details   Standing in corner widening base of support performing eyes open, eyes closed x 4-5 seconds noting an initial anterior sway followed by posterior fall (he overcorrects) with patient requiring min A.  Patient performed standing 1/4 turns using visual fixation as compensatory strategy due to polyneuropathy.  Performed eyes/head movement, then body movement working up to 1/2 turns and increasing speed of movement.  Performed feet together + head motion horizontal plane with visual fixation.  Compliant pillow surface with head motion spotting objects.  Rocker board with UE movements and CGA in parallel bars dec'ing UE support.  Step down from rocker board and step ups with min A.  "Up/up, down/down x 5 reps each side dec'ing UE support with min A.       Vestibular Treatment/Exercise - 05/03/18 1626      Vestibular Treatment/Exercise    Vestibular Treatment Provided  Gaze    Gaze Exercises  X1 Viewing Horizontal      X1 Viewing Horizontal   Foot Position  standing with UE support on solid surface    Comments  30 seconds, 60 seconds with increased postural sway and some cues to maintain fixation to target; then reduced UE support x 30 seconds with min A due to worsening postural sway.            PT Education - 05/03/18 1618    Education Details  recommended patient obtain [facility]    Person(s) Educated  Patient;Child(ren)    Methods  Explanation    Comprehension  Verbalized understanding       PT Short Term Goals - 04/11/18 1517      PT SHORT TERM GOAL #1   Title  Pt will initiate HEP in order to indicate decreased fall risk and improved functional mobility.  (Target Date: 05/11/18)    Time  4    Period  Weeks    Status  New    Target Date  05/11/18      PT SHORT TERM GOAL #2   Title  Pt will improve DGI to >/=17/24 in order to indicate decreased fall risk.     Time  4    Period  Weeks    Status  New      PT SHORT TERM GOAL #3   Title  Pt will perform 5TSS in </=13 secs without UE support with decreased posterior bias to indicate decreased fall risk.     Time  4    Period  Weeks    Status  New      PT SHORT TERM GOAL #4   Title  Pt will ambulate x 200' over indoor surfaces w/ LRAD, negotiating turns and through tight spaces without overt LOB in order to indicate safe home negotiation.     Time  4    Period  Weeks    Status  New        PT Long Term Goals - 04/11/18 1524      PT LONG TERM GOAL #1   Title  Pt will be independent with final HEP in order to indicate decreased fall risk and imroved functional mobility. (Target Date: 06/10/18)    Time  8    Period  Weeks    Status  New    Target Date  06/10/18      PT LONG TERM GOAL #2  Title  Pt will improve DGI to >/=19/24 in order to indicate decreased fall risk.     Time  8    Period  Weeks    Status  New      PT LONG TERM GOAL #3   Title   Pt will improve 5TSS to </=11 secs without UE support without posterior bias in order to indicate decreased fall risk.      Time  8    Period  Weeks    Status  New      PT LONG TERM GOAL #4   Title  Pt will improve gait sped to >/=3.22 ft/sec w/ LRAD at mod I level in order to indicate decreased fall risk and improved efficiency of gait.     Time  8    Period  Weeks    Status  New      PT LONG TERM GOAL #5   Title  Pt will ambulate over unlevel paved outdoor surfaces (ramp/curb) x 500' w/ LRAD while scanning environment at mod I level in order to indicate safe community ambulation.     Time  8    Period  Weeks    Status  New            Plan - 05/03/18 1624    Clinical Impression Statement  The patient improved with turns using visual fixation as comepnsatory strategy.  PT recommended patient use SPC with offset handle versus walking stick and also to have lights on anytime he is up.      PT Treatment/Interventions  ADLs/Self Care Home Management;DME Instruction;Gait training;Stair training;Functional mobility training;Therapeutic activities;Therapeutic exercise;Balance training;Neuromuscular re-education;Patient/family education;Orthotic Fit/Training;Passive range of motion;Vestibular    PT Next Visit Plan  turning using visual fixation, gaze x 1 (maybe add to HEP only in horiz plane due to progressive lenses), SPC with quad tip, RW in busy areas, check compliance with HEP.    Consulted and Agree with Plan of Care  Patient;Family member/caregiver    Family Member Consulted  daughter       Patient will benefit from skilled therapeutic intervention in order to improve the following deficits and impairments:  Decreased balance, Abnormal gait, Decreased coordination, Decreased knowledge of precautions, Decreased knowledge of use of DME, Decreased mobility, Decreased safety awareness, Decreased strength, Dizziness, Impaired perceived functional ability, Impaired flexibility, Impaired  sensation, Postural dysfunction  Visit Diagnosis: Unsteadiness on feet  Repeated falls  Muscle weakness (generalized)  Other abnormalities of gait and mobility     Problem List Patient Active Problem List   Diagnosis Date Noted  . Foot drop, bilateral 05/02/2013  . SDH (subdural hematoma) ([place]) 04/30/2013  . Polyneuropathy 04/30/2013  . Skull fracture ([place]) 04/30/2013  . Thoracic or lumbosacral neuritis or radiculitis, unspecified 03/18/2012  . Hereditary and idiopathic peripheral neuropathy 03/18/2012  . Abnormality of gait 03/18/2012  . Concussion with no loss of consciousness 03/18/2012  . Other and unspecified hyperlipidemia 03/18/2012  . Unspecified essential hypertension 03/18/2012    Abbie Jablon, PT 05/03/2018, 4:27 PM  [facility] [address] [place], [place], [address] Phone: [phone]   Fax:  [phone]  Name: Timothy Khan MRN: [id number] Date of Birth: 12-28-1931

## 2018-05-06 ENCOUNTER — Ambulatory Visit: Payer: Medicare Other | Admitting: Rehabilitation

## 2018-05-08 ENCOUNTER — Ambulatory Visit: Payer: Medicare Other | Admitting: Physical Therapy

## 2018-05-08 ENCOUNTER — Encounter: Payer: Self-pay | Admitting: Physical Therapy

## 2018-05-08 DIAGNOSIS — R2689 Other abnormalities of gait and mobility: Secondary | ICD-10-CM

## 2018-05-08 DIAGNOSIS — R296 Repeated falls: Secondary | ICD-10-CM

## 2018-05-08 DIAGNOSIS — R2681 Unsteadiness on feet: Secondary | ICD-10-CM | POA: Diagnosis not present

## 2018-05-08 DIAGNOSIS — M6281 Muscle weakness (generalized): Secondary | ICD-10-CM

## 2018-05-08 NOTE — Patient Instructions (Addendum)
Gaze Stabilization: Standing Feet Apart    Feet shoulder width apart, keeping eyes on target on wall _10_ feet away, tilt head down 15-30 and move head side to side for __30__ seconds. Perform 3 reps. Do _1___ sessions per day.   Copyright  VHI. All rights reserved.   Gaze Stabilization: Tip Card  1.Target must remain in focus, not blurry, and appear stationary while head is in motion. 2.Perform exercises with small head movements (45 to either side of midline). 3.Increase speed of head motion so long as target is in focus. 4.If you wear eyeglasses, be sure you can see target through lens (therapist will give specific instructions for bifocal / progressive lenses). 5.These exercises may provoke dizziness or nausea. Work through these symptoms. If too dizzy, slow head movement slightly. Rest between each exercise. 6.Exercises demand concentration; avoid distractions. 7.For safety, perform standing exercises close to a counter, wall, corner, or next to someone.  Copyright  VHI. All rights reserved.

## 2018-05-09 ENCOUNTER — Encounter: Payer: Self-pay | Admitting: Rehabilitation

## 2018-05-09 ENCOUNTER — Ambulatory Visit: Payer: Medicare Other | Admitting: Rehabilitation

## 2018-05-09 DIAGNOSIS — R208 Other disturbances of skin sensation: Secondary | ICD-10-CM

## 2018-05-09 DIAGNOSIS — R2689 Other abnormalities of gait and mobility: Secondary | ICD-10-CM

## 2018-05-09 DIAGNOSIS — R296 Repeated falls: Secondary | ICD-10-CM

## 2018-05-09 DIAGNOSIS — M6281 Muscle weakness (generalized): Secondary | ICD-10-CM

## 2018-05-09 DIAGNOSIS — R2681 Unsteadiness on feet: Secondary | ICD-10-CM

## 2018-05-09 NOTE — Therapy (Signed)
[facility] [address] [place], [place], [address] Phone: [phone]   Fax:  [phone]  Physical Therapy Treatment  Patient Details  Name: Timothy Khan MRN: [id number] Date of Birth: 08-28-1931 Referring Provider (PT): [name], MD (declining balance since fall in 2015)   Encounter Date: 05/09/2018  PT End of Session - 05/09/18 1419    Visit Number  6    Number of Visits  17    Date for PT Re-Evaluation  03/55/97   cert written for 90 days, POC for 60 days   Authorization Type  [employer] Medicare    PT Start Time  1102    PT Stop Time  1145    PT Time Calculation (min)  43 min    Equipment Utilized During Treatment  Gait belt;Other (comment)   B AFOs   Activity Tolerance  Patient tolerated treatment well    Behavior During Therapy  WFL for tasks assessed/performed       Past Medical History:  Diagnosis Date  . Abnormal brain MRI    Right side  . Basal cell carcinoma of back   . BPH (benign prostatic hyperplasia)   . Concussion 1950   Baseball injury  . Foot drop, bilateral 05/02/2013  . Gait disturbance   . Gastroesophageal reflux disease   . Hyperlipidemia   . Hypertension   . Lumbago   . Peripheral neuropathy   . Skull fracture [facility])     Past Surgical History:  Procedure Laterality Date  . CATARACT EXTRACTION Bilateral   . KNEE SURGERY Left   . skin cancer resection     basal cell, [place] carcinoma  . TONSILLECTOMY    . VASECTOMY      There were no vitals filed for this visit.  Subjective Assessment - 05/09/18 1103    Subjective  Pt reports no changes since yesterday.  Still has not gotten cane and is not performing exercises consistently.     Pertinent History  Jan 14th was last fall with back injury (MRI 1/31).      Limitations  Standing;Walking    How long can you walk comfortably?  Reports he can walk a while, but is wobbly.     Patient Stated Goals  "to improve my balance"     Currently in Pain?  No/denies    Pain Score  0-No pain                       [facility] Adult PT Treatment/Exercise - 05/09/18 1116      Transfers   Transfers  Sit to Stand;Stand to Sit    Sit to Stand  5: Supervision    Sit to Stand Details  Verbal cues for sequencing    Sit to Stand Details (indicate cue type and reason)  Min cues for remaining closer to EOC to avoid hitting LEs against back of chair.     Five time sit to stand comments   15.19 secs without UE support     Stand to Sit  5: Supervision;With upper extremity assist;To bed    Stand to Sit Details (indicate cue type and reason)  Verbal cues for sequencing;Verbal cues for technique      Ambulation/Gait   Ambulation/Gait  Yes    Ambulation/Gait Assistance  4: Min guard;4: Min assist;5: Supervision    Ambulation/Gait Assistance Details  Pt ambulatory into clinic today with hand made cane with regular SPC due to not being  able to find other cane with quad tip.  Continue to recommend that he go and buy [facility] and place quad tip on cane for more support and also continue to recommend use of RW when in busier areas, however pt very resistant to this at this time.  Continue to work on gait during session with clinics [facility] with quad tip for visual compensation (looking with head/eyes and then turning body).  Pt has max difficulty doing this within session during functional walking, however does well when broken down into task in corner.  Provided visual tasks for pt to look for during gait.  Pt has increased difficulty with horizontal turning vs vertical.  Also had pt work through tight spaces during session as he would have to do in home.  Pt continues to want to get too close to objects and pick cane up when negotiating in tight areas.  Cues and demonstration on how to side step as needed through tighter areas.      Ambulation Distance (Feet)  500 Feet    Assistive device  Straight cane;Other (Comment)   with quad tip, B AFOs    Gait Pattern  Step-through pattern;Decreased stride length;Decreased dorsiflexion - right;Decreased dorsiflexion - left;Lateral hip instability;Trunk flexed;Wide base of support    Ambulation Surface  Level;Indoor      Standardized Balance Assessment   Standardized Balance Assessment  Dynamic Gait Index      Dynamic Gait Index   Level Surface  Mild Impairment    Change in Gait Speed  Mild Impairment    Gait with Horizontal Head Turns  Moderate Impairment    Gait with Vertical Head Turns  Mild Impairment    Gait and Pivot Turn  Mild Impairment    Step Over Obstacle  Moderate Impairment    Step Around Obstacles  Mild Impairment    Steps  Mild Impairment    Total Score  14       NMR:  In corner with chair in front for support as needed, performed 1/4 turns scanning eyes/head then body x 10 reps each direction and then 1/2 turns turning head/eyes then body.        PT Education - 05/09/18 1105    Education Details  slow progress towards goals due to non-compliance with HEP, still furniture walking at home    Person(s) Educated  Patient;Child(ren)    Methods  Explanation    Comprehension  Verbalized understanding       PT Short Term Goals - 05/09/18 1111      PT SHORT TERM GOAL #1   Title  Pt will initiate HEP in order to indicate decreased fall risk and improved functional mobility.  (Target Date: 05/11/18)    Baseline  reports not performing at home     Time  4    Period  Weeks    Status  Not Met    Target Date  05/11/18      PT SHORT TERM GOAL #2   Title  Pt will improve DGI to >/=17/24 in order to indicate decreased fall risk.     Baseline  14/24 on 05/09/18, 1 point decline     Time  4    Period  Weeks    Status  Not Met      PT SHORT TERM GOAL #3   Title  Pt will perform 5TSS in </=13 secs without UE support with decreased posterior bias to indicate decreased fall risk.     Baseline  15.19 secs without UE support 05/09/18    Time  4    Period  Weeks    Status   Partially Met      PT SHORT TERM GOAL #4   Title  Pt will ambulate x 200' over indoor surfaces w/ LRAD, negotiating turns and through tight spaces without overt LOB in order to indicate safe home negotiation.     Baseline  continues to require intermittent min/guard to min A when turning    Time  4    Period  Weeks    Status  Not Met        PT Long Term Goals - 04/11/18 1524      PT LONG TERM GOAL #1   Title  Pt will be independent with final HEP in order to indicate decreased fall risk and imroved functional mobility. (Target Date: 06/10/18)    Time  8    Period  Weeks    Status  New    Target Date  06/10/18      PT LONG TERM GOAL #2   Title  Pt will improve DGI to >/=19/24 in order to indicate decreased fall risk.     Time  8    Period  Weeks    Status  New      PT LONG TERM GOAL #3   Title  Pt will improve 5TSS to </=11 secs without UE support without posterior bias in order to indicate decreased fall risk.      Time  8    Period  Weeks    Status  New      PT LONG TERM GOAL #4   Title  Pt will improve gait sped to >/=3.22 ft/sec w/ LRAD at mod I level in order to indicate decreased fall risk and improved efficiency of gait.     Time  8    Period  Weeks    Status  New      PT LONG TERM GOAL #5   Title  Pt will ambulate over unlevel paved outdoor surfaces (ramp/curb) x 500' w/ LRAD while scanning environment at mod I level in order to indicate safe community ambulation.     Time  8    Period  Weeks    Status  New            Plan - 05/09/18 1419    Clinical Impression Statement  Pt has only partially met goal for 5TSS, otherwise has not met and even had one point decline with DGI.  Pt does demonstrate improved balance with visual targeting/compensations, however has difficulty putting into functional gait.  Continue to educate on compliance of HEP at home.  Recommended that he do corner exercises with chair in front of him with wife sitting in chair as he helps her  with her HEP and then he could do his exercises at the same time.  Pt and daughter verbalized understanding.     Rehab Potential  Good    Clinical Impairments Affecting Rehab Potential  motivated to improve balance, unknown injury from fall?    PT Frequency  2x / week    PT Duration  8 weeks    PT Treatment/Interventions  ADLs/Self Care Home Management;DME Instruction;Gait training;Stair training;Functional mobility training;Therapeutic activities;Therapeutic exercise;Balance training;Neuromuscular re-education;Patient/family education;Orthotic Fit/Training;Passive range of motion;Vestibular    PT Next Visit Plan  Continue to work on gait and balance with visual compensations, very wide BOS with EC, turns    Consulted and  Agree with Plan of Care  Patient       Patient will benefit from skilled therapeutic intervention in order to improve the following deficits and impairments:  Decreased balance, Abnormal gait, Decreased coordination, Decreased knowledge of precautions, Decreased knowledge of use of DME, Decreased mobility, Decreased safety awareness, Decreased strength, Dizziness, Impaired perceived functional ability, Impaired flexibility, Impaired sensation, Postural dysfunction  Visit Diagnosis: Unsteadiness on feet  Repeated falls  Muscle weakness (generalized)  Other abnormalities of gait and mobility  Other disturbances of skin sensation     Problem List Patient Active Problem List   Diagnosis Date Noted  . Foot drop, bilateral 05/02/2013  . SDH (subdural hematoma) ([place]) 04/30/2013  . Polyneuropathy 04/30/2013  . Skull fracture ([place]) 04/30/2013  . Thoracic or lumbosacral neuritis or radiculitis, unspecified 03/18/2012  . Hereditary and idiopathic peripheral neuropathy 03/18/2012  . Abnormality of gait 03/18/2012  . Concussion with no loss of consciousness 03/18/2012  . Other and unspecified hyperlipidemia 03/18/2012  . Unspecified essential hypertension 03/18/2012     Cameron Sprang, PT, MPT [facility] [address] [place], [place], [address] Phone: [phone]   Fax:  [phone] 05/09/18, 2:28 PM  Name: Timothy Khan MRN: [id number] Date of Birth: March 02, 1932

## 2018-05-09 NOTE — Therapy (Signed)
[facility] [address] [place], [place], [address] Phone: [phone]   Fax:  [phone]  Physical Therapy Treatment  Patient Details  Name: Timothy Khan MRN: [id number] Date of Birth: 22-Jun-1931 Referring Provider (PT): [name], MD (declining balance since fall in 2015)   Encounter Date: 05/08/2018  PT End of Session - 05/08/18 0943    Visit Number  5    Number of Visits  17    Date for PT Re-Evaluation  40/81/44   cert written for 90 days, POC for 60 days   Authorization Type  [employer] Medicare    PT Start Time  0932    PT Stop Time  1015    PT Time Calculation (min)  43 min    Equipment Utilized During Treatment  Gait belt;Other (comment)   B AFOs   Activity Tolerance  Patient tolerated treatment well    Behavior During Therapy  WFL for tasks assessed/performed       Past Medical History:  Diagnosis Date  . Abnormal brain MRI    Right side  . Basal cell carcinoma of back   . BPH (benign prostatic hyperplasia)   . Concussion 1950   Baseball injury  . Foot drop, bilateral 05/02/2013  . Gait disturbance   . Gastroesophageal reflux disease   . Hyperlipidemia   . Hypertension   . Lumbago   . Peripheral neuropathy   . Skull fracture [facility])     Past Surgical History:  Procedure Laterality Date  . CATARACT EXTRACTION Bilateral   . KNEE SURGERY Left   . skin cancer resection     basal cell, [place] carcinoma  . TONSILLECTOMY    . VASECTOMY      There were no vitals filed for this visit.  Subjective Assessment - 05/08/18 0940    Subjective  No new complaints. Has braces on today. Did have some low back pain on waking up and does not at this time. No falls. Has not had a chance to get the cane. Reports his wife was diagnosed with breast cancer since his last visit and they have had lots of appt's related to that. Plans to start looking for one now.     Pertinent History  Jan 14th was last fall with  back injury (MRI 1/31).      Limitations  Standing;Walking    How long can you walk comfortably?  Reports he can walk a while, but is wobbly.     Patient Stated Goals  "to improve my balance"    Currently in Pain?  No/denies    Pain Score  0-No pain           [facility] Adult PT Treatment/Exercise - 05/08/18 0958      Transfers   Transfers  Sit to Stand;Stand to Sit    Sit to Stand  5: Supervision;With upper extremity assist;Without upper extremity assist;From chair/3-in-1    Stand to Sit  5: Supervision;With upper extremity assist;To bed      Ambulation/Gait   Ambulation/Gait  Yes    Ambulation/Gait Assistance  4: Min guard    Ambulation/Gait Assistance Details  continues to use walking stick, plans to look for a cane starting today. continued use of cane in therapy with min guard to min assist for balance correction. continued to work on gaze stabilization with gait to assist with balance. pt moved eyes, then head/body most times, can move eyes/head together.     Ambulation Distance (Feet)  80 Feet   x1, 325 x1 w/cane   Assistive device  Straight cane;Other (Comment)   pt's walking stick; cane with rubber quad tip   Gait Pattern  Step-through pattern;Decreased stride length;Decreased dorsiflexion - right;Decreased dorsiflexion - left;Lateral hip instability;Trunk flexed;Wide base of support    Ambulation Surface  Level;Indoor      Vestibular Treatment/Exercise - 05/08/18 0944      Vestibular Treatment/Exercise   Vestibular Treatment Provided  Gaze    Gaze Exercises  X1 Viewing Horizontal      X1 Viewing Horizontal   Foot Position  with feet wide, progressing to more narrow, then even more narrow.     Comments  30 sec's x 2 with each foot position.          Balance Exercises - 05/08/18 1005      Balance Exercises: Standing   Balance Beam  on blue beam in parallel bars: alternating fwd stepping to floor/back onto beam, then alternating bwd stepping/back onto beam. light  touch to bars, min guard to min assist for balance with cues on step length/step height needed.     Sidestepping  Foam/compliant support;Upper extremity support;3 reps;Limitations      Balance Exercises: Standing   Sidestepping Limitations  on blue beam with light UE support on bars for 3 laps each way. min guard assist for balance.         PT Education - 05/08/18 0958    Education Details  added standing x1 viewing to HEP    Person(s) Educated  Patient    Methods  Explanation;Demonstration;Verbal cues;Handout    Comprehension  Verbalized understanding;Returned demonstration;Verbal cues required;Need further instruction       PT Short Term Goals - 04/11/18 1517      PT SHORT TERM GOAL #1   Title  Pt will initiate HEP in order to indicate decreased fall risk and improved functional mobility.  (Target Date: 05/11/18)    Time  4    Period  Weeks    Status  New    Target Date  05/11/18      PT SHORT TERM GOAL #2   Title  Pt will improve DGI to >/=17/24 in order to indicate decreased fall risk.     Time  4    Period  Weeks    Status  New      PT SHORT TERM GOAL #3   Title  Pt will perform 5TSS in </=13 secs without UE support with decreased posterior bias to indicate decreased fall risk.     Time  4    Period  Weeks    Status  New      PT SHORT TERM GOAL #4   Title  Pt will ambulate x 200' over indoor surfaces w/ LRAD, negotiating turns and through tight spaces without overt LOB in order to indicate safe home negotiation.     Time  4    Period  Weeks    Status  New        PT Long Term Goals - 04/11/18 1524      PT LONG TERM GOAL #1   Title  Pt will be independent with final HEP in order to indicate decreased fall risk and imroved functional mobility. (Target Date: 06/10/18)    Time  8    Period  Weeks    Status  New    Target Date  06/10/18      PT LONG TERM GOAL #2   Title  Pt will improve DGI to >/=19/24 in order to indicate decreased fall risk.     Time  8     Period  Weeks    Status  New      PT LONG TERM GOAL #3   Title  Pt will improve 5TSS to </=11 secs without UE support without posterior bias in order to indicate decreased fall risk.      Time  8    Period  Weeks    Status  New      PT LONG TERM GOAL #4   Title  Pt will improve gait sped to >/=3.22 ft/sec w/ LRAD at mod I level in order to indicate decreased fall risk and improved efficiency of gait.     Time  8    Period  Weeks    Status  New      PT LONG TERM GOAL #5   Title  Pt will ambulate over unlevel paved outdoor surfaces (ramp/curb) x 500' w/ LRAD while scanning environment at mod I level in order to indicate safe community ambulation.     Time  8    Period  Weeks    Status  New            Plan - 05/08/18 0943    Clinical Impression Statement  Today's skilled session continued to address gait with cane and balance reactions. Pt noted to be more off balance in gym with distractions/busy enviroment. Added x1 viewing to HEP today after performance in session.     Rehab Potential  Good    Clinical Impairments Affecting Rehab Potential  motivated to improve balance, unknown injury from fall?    PT Frequency  2x / week    PT Duration  8 weeks    PT Treatment/Interventions  ADLs/Self Care Home Management;DME Instruction;Gait training;Stair training;Functional mobility training;Therapeutic activities;Therapeutic exercise;Balance training;Neuromuscular re-education;Patient/family education;Orthotic Fit/Training;Passive range of motion;Vestibular    PT Next Visit Plan  STGs due 05/11/18    Consulted and Agree with Plan of Care  Patient       Patient will benefit from skilled therapeutic intervention in order to improve the following deficits and impairments:  Decreased balance, Abnormal gait, Decreased coordination, Decreased knowledge of precautions, Decreased knowledge of use of DME, Decreased mobility, Decreased safety awareness, Decreased strength, Dizziness, Impaired  perceived functional ability, Impaired flexibility, Impaired sensation, Postural dysfunction  Visit Diagnosis: Unsteadiness on feet  Repeated falls  Muscle weakness (generalized)  Other abnormalities of gait and mobility     Problem List Patient Active Problem List   Diagnosis Date Noted  . Foot drop, bilateral 05/02/2013  . SDH (subdural hematoma) ([facility]) 04/30/2013  . Polyneuropathy 04/30/2013  . Skull fracture ([facility]) 04/30/2013  . Thoracic or lumbosacral neuritis or radiculitis, unspecified 03/18/2012  . Hereditary and idiopathic peripheral neuropathy 03/18/2012  . Abnormality of gait 03/18/2012  . Concussion with no loss of consciousness 03/18/2012  . Other and unspecified hyperlipidemia 03/18/2012  . Unspecified essential hypertension 03/18/2012    Willow Ora, PTA, [facility] [address] [place], [place] [address] [phone] 05/09/18, 8:28 AM   Name: Timothy Khan MRN: [id number] Date of Birth: 07/04/1931

## 2018-05-14 ENCOUNTER — Ambulatory Visit: Payer: Medicare Other | Admitting: Physical Therapy

## 2018-05-15 ENCOUNTER — Ambulatory Visit: Payer: Medicare Other | Admitting: Physical Therapy

## 2018-05-15 ENCOUNTER — Encounter: Payer: Self-pay | Admitting: Physical Therapy

## 2018-05-15 DIAGNOSIS — R296 Repeated falls: Secondary | ICD-10-CM

## 2018-05-15 DIAGNOSIS — R2681 Unsteadiness on feet: Secondary | ICD-10-CM | POA: Diagnosis not present

## 2018-05-15 DIAGNOSIS — R208 Other disturbances of skin sensation: Secondary | ICD-10-CM

## 2018-05-15 DIAGNOSIS — M6281 Muscle weakness (generalized): Secondary | ICD-10-CM

## 2018-05-15 DIAGNOSIS — R2689 Other abnormalities of gait and mobility: Secondary | ICD-10-CM

## 2018-05-16 NOTE — Therapy (Signed)
[facility] [address] [place], [place], [address] Phone: [phone]   Fax:  [phone]  Physical Therapy Treatment  Patient Details  Name: Timothy Khan MRN: [id number] Date of Birth: [birth year] Referring Provider (PT): [name], MD (declining balance since fall in 2015)   Encounter Date: 05/15/2018  PT End of Session - 05/15/18 1829    Visit Number  7    Number of Visits  17    Date for PT Re-Evaluation  93/71/69   cert written for 90 days, POC for 60 days   Authorization Type  [employer] Medicare    PT Start Time  0935    PT Stop Time  1015    PT Time Calculation (min)  40 min    Equipment Utilized During Treatment  Gait belt;Other (comment)   B AFOs   Activity Tolerance  Patient tolerated treatment well    Behavior During Therapy  WFL for tasks assessed/performed       Past Medical History:  Diagnosis Date  . Abnormal brain MRI    Right side  . Basal cell carcinoma of back   . BPH (benign prostatic hyperplasia)   . Concussion 1950   Baseball injury  . Foot drop, bilateral 05/02/2013  . Gait disturbance   . Gastroesophageal reflux disease   . Hyperlipidemia   . Hypertension   . Lumbago   . Peripheral neuropathy   . Skull fracture [facility])     Past Surgical History:  Procedure Laterality Date  . CATARACT EXTRACTION Bilateral   . KNEE SURGERY Left   . skin cancer resection     basal cell, [place] carcinoma  . TONSILLECTOMY    . VASECTOMY      There were no vitals filed for this visit.  Subjective Assessment - 05/15/18 0936    Subjective  No new complaitns. No falls or pain to report. Daughter reports they have started looking online at canes, have not purchased one as yet.  Pt to clinic today with walking stick with quad tip.     Pertinent History  Jan 14th was last fall with back injury (MRI 1/31).      Limitations  Standing;Walking    How long can you walk comfortably?  Reports he can walk  a while, but is wobbly.     Patient Stated Goals  "to improve my balance"    Currently in Pain?  No/denies    Pain Score  0-No pain            [facility] Adult PT Treatment/Exercise - 05/15/18 0938      Transfers   Transfers  Sit to Stand;Stand to Sit    Sit to Stand  5: Supervision;With upper extremity assist;From bed;From chair/3-in-1    Sit to Stand Details  Verbal cues for sequencing    Stand to Sit  5: Supervision;With upper extremity assist;To bed;To chair/3-in-1    Stand to Sit Details (indicate cue type and reason)  Verbal cues for sequencing;Verbal cues for technique      Ambulation/Gait   Ambulation/Gait  Yes    Ambulation/Gait Assistance  4: Min guard;4: Min assist;5: Supervision    Ambulation/Gait Assistance Details  with cane: worked on use of visual targets with moving eyes>head>turning body with gait for incr stability. min guard to min assist for balance. increased assistance needed when pt was distracted or enviroment was busy.  Ambulation Distance (Feet)  230 Feet   x1 with use of gaze stabliz.; around gym with activity   Assistive device  Straight cane;Other (Comment)    Gait Pattern  Step-through pattern;Decreased stride length;Decreased dorsiflexion - right;Decreased dorsiflexion - left;Lateral hip instability;Trunk flexed;Wide base of support    Ambulation Surface  Level;Indoor    Gait Comments  worked on gait with cane through narrow spaces with min assist, cues to keep cane on ground and side step if neccessary.       Neuro Re-ed    Neuro Re-ed Details   for balance/coordination/muscle re-ed: in corner with chair in front for safety: wide base of support- EC no head movements, progressing to EC head movements left<>right, then up<>down with min assist needed; feet hip width apart on floor: 180* turns using gaze stabilization- performed 3 full turns each way with min guard to min assist and hand support on walls/chair; standing on 1 inch foam  with finger tip support x1 with each hand- wide base of support for EC no head movements, then EO head movements left<>right, then up<>down. min to mod assist for balance.          Balance Exercises - 05/15/18 1007      Balance Exercises: Standing   SLS with Vectors  Solid surface;Upper extremity assist 1;Other reps (comment);Limitations   with cane support     Balance Exercises: Standing   SLS with Vectors Limitations  2 foam bubbles on floor with cane suppport: alternating fwd taps, then cross taps with  min guard to min assist for balance, cues for wider stance and incr weight shifting for blaance.           PT Short Term Goals - 05/09/18 1111      PT SHORT TERM GOAL #1   Title  Pt will initiate HEP in order to indicate decreased fall risk and improved functional mobility.  (Target Date: 05/11/18)    Baseline  reports not performing at home     Time  4    Period  Weeks    Status  Not Met    Target Date  05/11/18      PT SHORT TERM GOAL #2   Title  Pt will improve DGI to >/=17/24 in order to indicate decreased fall risk.     Baseline  14/24 on 05/09/18, 1 point decline     Time  4    Period  Weeks    Status  Not Met      PT SHORT TERM GOAL #3   Title  Pt will perform 5TSS in </=13 secs without UE support with decreased posterior bias to indicate decreased fall risk.     Baseline  15.19 secs without UE support 05/09/18    Time  4    Period  Weeks    Status  Partially Met      PT SHORT TERM GOAL #4   Title  Pt will ambulate x 200' over indoor surfaces w/ LRAD, negotiating turns and through tight spaces without overt LOB in order to indicate safe home negotiation.     Baseline  continues to require intermittent min/guard to min A when turning    Time  4    Period  Weeks    Status  Not Met        PT Long Term Goals - 04/11/18 1524      PT LONG TERM GOAL #1   Title  Pt will be independent with final  HEP in order to indicate decreased fall risk and imroved  functional mobility. (Target Date: 06/10/18)    Time  8    Period  Weeks    Status  New    Target Date  06/10/18      PT LONG TERM GOAL #2   Title  Pt will improve DGI to >/=19/24 in order to indicate decreased fall risk.     Time  8    Period  Weeks    Status  New      PT LONG TERM GOAL #3   Title  Pt will improve 5TSS to </=11 secs without UE support without posterior bias in order to indicate decreased fall risk.      Time  8    Period  Weeks    Status  New      PT LONG TERM GOAL #4   Title  Pt will improve gait sped to >/=3.22 ft/sec w/ LRAD at mod I level in order to indicate decreased fall risk and improved efficiency of gait.     Time  8    Period  Weeks    Status  New      PT LONG TERM GOAL #5   Title  Pt will ambulate over unlevel paved outdoor surfaces (ramp/curb) x 500' w/ LRAD while scanning environment at mod I level in order to indicate safe community ambulation.     Time  8    Period  Weeks    Status  New            Plan - 05/15/18 2774    Clinical Impression Statement  Today's skilled session continued to focus on gaze stabilization with gait and balance activities. Continues to need cues for visual targeting/compensations. Continued to reinforce performance of corner balance ex's for continued progress.     Rehab Potential  Good    Clinical Impairments Affecting Rehab Potential  motivated to improve balance, unknown injury from fall?    PT Frequency  2x / week    PT Duration  8 weeks    PT Treatment/Interventions  ADLs/Self Care Home Management;DME Instruction;Gait training;Stair training;Functional mobility training;Therapeutic activities;Therapeutic exercise;Balance training;Neuromuscular re-education;Patient/family education;Orthotic Fit/Training;Passive range of motion;Vestibular    PT Next Visit Plan  Continue to work on gait and balance with visual compensations, very wide BOS with EC, turns    Consulted and Agree with Plan of Care  Patient        Patient will benefit from skilled therapeutic intervention in order to improve the following deficits and impairments:  Decreased balance, Abnormal gait, Decreased coordination, Decreased knowledge of precautions, Decreased knowledge of use of DME, Decreased mobility, Decreased safety awareness, Decreased strength, Dizziness, Impaired perceived functional ability, Impaired flexibility, Impaired sensation, Postural dysfunction  Visit Diagnosis: Unsteadiness on feet  Repeated falls  Muscle weakness (generalized)  Other abnormalities of gait and mobility  Other disturbances of skin sensation     Problem List Patient Active Problem List   Diagnosis Date Noted  . Foot drop, bilateral 05/02/2013  . SDH (subdural hematoma) ([facility]) 04/30/2013  . Polyneuropathy 04/30/2013  . Skull fracture ([facility]) 04/30/2013  . Thoracic or lumbosacral neuritis or radiculitis, unspecified 03/18/2012  . Hereditary and idiopathic peripheral neuropathy 03/18/2012  . Abnormality of gait 03/18/2012  . Concussion with no loss of consciousness 03/18/2012  . Other and unspecified hyperlipidemia 03/18/2012  . Unspecified essential hypertension 03/18/2012    Willow Ora, PTA, [facility] [address]  [place], [place] [address] [phone] 05/16/18, 2:21 PM   Name: Timothy Khan MRN: [id number] Date of Birth: 1931/08/15

## 2018-05-17 ENCOUNTER — Encounter: Payer: Self-pay | Admitting: Physical Therapy

## 2018-05-17 ENCOUNTER — Ambulatory Visit: Payer: Medicare Other | Admitting: Physical Therapy

## 2018-05-17 DIAGNOSIS — R2681 Unsteadiness on feet: Secondary | ICD-10-CM | POA: Diagnosis not present

## 2018-05-17 DIAGNOSIS — R208 Other disturbances of skin sensation: Secondary | ICD-10-CM

## 2018-05-17 DIAGNOSIS — R296 Repeated falls: Secondary | ICD-10-CM

## 2018-05-17 DIAGNOSIS — M6281 Muscle weakness (generalized): Secondary | ICD-10-CM

## 2018-05-17 DIAGNOSIS — R2689 Other abnormalities of gait and mobility: Secondary | ICD-10-CM

## 2018-05-17 NOTE — Therapy (Signed)
[facility] [address] [place], [place], [address] Phone: [phone]   Fax:  [phone]  Physical Therapy Treatment  Patient Details  Name: Timothy Khan MRN: [id number] Date of Birth: 19-Jun-1931 Referring Provider (PT): [name], MD (declining balance since fall in 2015)   Encounter Date: 05/17/2018  PT End of Session - 05/17/18 2052    Visit Number  8    Number of Visits  17    Date for PT Re-Evaluation  49/44/96   cert written for 90 days, POC for 60 days   Authorization Type  [employer] Medicare - 10th visit PN needed    PT Start Time  1318    PT Stop Time  1408    PT Time Calculation (min)  50 min    Equipment Utilized During Treatment  Other (comment)   B AFOs   Activity Tolerance  Patient tolerated treatment well    Behavior During Therapy  [facility] for tasks assessed/performed       Past Medical History:  Diagnosis Date  . Abnormal brain MRI    Right side  . Basal cell carcinoma of back   . BPH (benign prostatic hyperplasia)   . Concussion 1950   Baseball injury  . Foot drop, bilateral 05/02/2013  . Gait disturbance   . Gastroesophageal reflux disease   . Hyperlipidemia   . Hypertension   . Lumbago   . Peripheral neuropathy   . Skull fracture [facility])     Past Surgical History:  Procedure Laterality Date  . CATARACT EXTRACTION Bilateral   . KNEE SURGERY Left   . skin cancer resection     basal cell, [place] carcinoma  . TONSILLECTOMY    . VASECTOMY      There were no vitals filed for this visit.  Subjective Assessment - 05/17/18 1323    Subjective  Pt's son with him today.  Pt has cane with quad tip on it today.  Pt slept in his bed for the first time in 3-4 weeks.  Pelvis was a little sore this morning after sleeping in the bed.    Pertinent History  Jan 14th was last fall with back injury (MRI 1/31).      Limitations  Standing;Walking    How long can you walk comfortably?  Reports he can  walk a while, but is wobbly.     Patient Stated Goals  "to improve my balance"    Currently in Pain?  No/denies                       [facility] Adult PT Treatment/Exercise - 05/17/18 1342      Ambulation/Gait   Ambulation/Gait  Yes    Ambulation/Gait Assistance  4: Min guard    Ambulation/Gait Assistance Details  with cane with quad tip with verbal cues for placement of all four points of quad tip on floor and not putting cane down at angle    Ambulation Distance (Feet)  115 Feet    Assistive device  Straight cane    Stairs  Yes    Stairs Assistance  4: Min guard    Stairs Assistance Details (indicate cue type and reason)  with cane and L rail with cues for safe sequence with cane, safe placement of cane before lifting each foot and forward weight shift when descending to prevent posterior LOB    Stair Management Technique  One rail Left;Alternating pattern;Step to pattern;Forwards;With cane  Number of Stairs  12    Height of Stairs  6    Ramp  4: Min assist    Ramp Details (indicate cue type and reason)  with quad cane and verbal cues for sequence    Curb  4: Min assist    Curb Details (indicate cue type and reason)  with quad tip cane and verbal cues for safe sequence, min A for balance      Therapeutic Activites    Therapeutic Activities  Other Therapeutic Activities    Other Therapeutic Activities  Adjusted cane to appropriate height for patient          Balance Exercises - 05/17/18 1407      Balance Exercises: Standing   Step Over Hurdles / Cones  performed figure 8 gait and weaving L and R around cones with cane focusing on safe sequence with pivoting and stepping, bringing cane forwards with L foot and stepping through with R.        PT Education - 05/17/18 2052    Education Details  gait training and safety with cane    Person(s) Educated  Patient;Child(ren)    Methods  Explanation;Demonstration    Comprehension  Verbalized understanding;Returned  demonstration       PT Short Term Goals - 05/09/18 1111      PT SHORT TERM GOAL #1   Title  Pt will initiate HEP in order to indicate decreased fall risk and improved functional mobility.  (Target Date: 05/11/18)    Baseline  reports not performing at home     Time  4    Period  Weeks    Status  Not Met    Target Date  05/11/18      PT SHORT TERM GOAL #2   Title  Pt will improve DGI to >/=17/24 in order to indicate decreased fall risk.     Baseline  14/24 on 05/09/18, 1 point decline     Time  4    Period  Weeks    Status  Not Met      PT SHORT TERM GOAL #3   Title  Pt will perform 5TSS in </=13 secs without UE support with decreased posterior bias to indicate decreased fall risk.     Baseline  15.19 secs without UE support 05/09/18    Time  4    Period  Weeks    Status  Partially Met      PT SHORT TERM GOAL #4   Title  Pt will ambulate x 200' over indoor surfaces w/ LRAD, negotiating turns and through tight spaces without overt LOB in order to indicate safe home negotiation.     Baseline  continues to require intermittent min/guard to min A when turning    Time  4    Period  Weeks    Status  Not Met        PT Long Term Goals - 04/11/18 1524      PT LONG TERM GOAL #1   Title  Pt will be independent with final HEP in order to indicate decreased fall risk and imroved functional mobility. (Target Date: 06/10/18)    Time  8    Period  Weeks    Status  New    Target Date  06/10/18      PT LONG TERM GOAL #2   Title  Pt will improve DGI to >/=19/24 in order to indicate decreased fall risk.     Time  8  Period  Weeks    Status  New      PT LONG TERM GOAL #3   Title  Pt will improve 5TSS to </=11 secs without UE support without posterior bias in order to indicate decreased fall risk.      Time  8    Period  Weeks    Status  New      PT LONG TERM GOAL #4   Title  Pt will improve gait sped to >/=3.22 ft/sec w/ LRAD at mod I level in order to indicate decreased fall risk  and improved efficiency of gait.     Time  8    Period  Weeks    Status  New      PT LONG TERM GOAL #5   Title  Pt will ambulate over unlevel paved outdoor surfaces (ramp/curb) x 500' w/ LRAD while scanning environment at mod I level in order to indicate safe community ambulation.     Time  8    Period  Weeks    Status  New            Plan - 05/17/18 2053    Clinical Impression Statement  Treatment session today focused on continued education, safety and gait training with patient's cane purchased by family.  Reviewed safe gait over level surfaces, inclines, stairs and when pivoting or weaving around obstacles.  Patient required consistent verbal and visual cues to perform safely.  Will continue to address and progress towards LTG.    Rehab Potential  Good    Clinical Impairments Affecting Rehab Potential  motivated to improve balance, unknown injury from fall?    PT Frequency  2x / week    PT Duration  8 weeks    PT Treatment/Interventions  ADLs/Self Care Home Management;DME Instruction;Gait training;Stair training;Functional mobility training;Therapeutic activities;Therapeutic exercise;Balance training;Neuromuscular re-education;Patient/family education;Orthotic Fit/Training;Passive range of motion;Vestibular    PT Next Visit Plan  Needs more visits-LTG are through 3/23 but only scheduled through 3/5.  Keep working on safe gait with cane - curb, ramp, stairs, turns/weaving.  Pt is still interested in ushering at church (passing the offering plate to each aisle-walking sideways/backwards over carpet).  multi sensory balance training.      Consulted and Agree with Plan of Care  Patient;Family member/caregiver    Family Member Consulted  son       Patient will benefit from skilled therapeutic intervention in order to improve the following deficits and impairments:  Decreased balance, Abnormal gait, Decreased coordination, Decreased knowledge of precautions, Decreased knowledge of use of  DME, Decreased mobility, Decreased safety awareness, Decreased strength, Dizziness, Impaired perceived functional ability, Impaired flexibility, Impaired sensation, Postural dysfunction  Visit Diagnosis: Unsteadiness on feet  Repeated falls  Muscle weakness (generalized)  Other abnormalities of gait and mobility  Other disturbances of skin sensation     Problem List Patient Active Problem List   Diagnosis Date Noted  . Foot drop, bilateral 05/02/2013  . SDH (subdural hematoma) ([place]) 04/30/2013  . Polyneuropathy 04/30/2013  . Skull fracture ([place]) 04/30/2013  . Thoracic or lumbosacral neuritis or radiculitis, unspecified 03/18/2012  . Hereditary and idiopathic peripheral neuropathy 03/18/2012  . Abnormality of gait 03/18/2012  . Concussion with no loss of consciousness 03/18/2012  . Other and unspecified hyperlipidemia 03/18/2012  . Unspecified essential hypertension 03/18/2012    Timothy Khan 05/17/2018, 8:58 PM  [facility] [address] [place], [place], [address] Phone: [phone]   Fax:  [phone]  Name: Timothy Khan MRN: [id number] Date of Birth: 05/13/1931

## 2018-05-21 ENCOUNTER — Ambulatory Visit: Payer: Medicare Other | Attending: Internal Medicine | Admitting: Rehabilitation

## 2018-05-21 ENCOUNTER — Encounter: Payer: Self-pay | Admitting: Rehabilitation

## 2018-05-21 DIAGNOSIS — M6281 Muscle weakness (generalized): Secondary | ICD-10-CM | POA: Diagnosis present

## 2018-05-21 DIAGNOSIS — R296 Repeated falls: Secondary | ICD-10-CM | POA: Diagnosis not present

## 2018-05-21 DIAGNOSIS — R2689 Other abnormalities of gait and mobility: Secondary | ICD-10-CM

## 2018-05-21 DIAGNOSIS — R2681 Unsteadiness on feet: Secondary | ICD-10-CM | POA: Diagnosis present

## 2018-05-21 DIAGNOSIS — R208 Other disturbances of skin sensation: Secondary | ICD-10-CM | POA: Diagnosis present

## 2018-05-21 NOTE — Therapy (Signed)
[facility] [address] [place], [place], [address] Phone: [phone]   Fax:  [phone]  Physical Therapy Treatment  Patient Details  Name: Timothy Khan MRN: [id number] Date of Birth: 1931-05-31 Referring Provider (PT): [name], MD (declining balance since fall in 2015)   Encounter Date: 05/21/2018  PT End of Session - 05/21/18 1410    Visit Number  9    Number of Visits  17    Date for PT Re-Evaluation  84/66/59   cert written for 90 days, POC for 60 days   Authorization Type  [employer] Medicare - 10th visit PN needed    PT Start Time  1316    PT Stop Time  1402    PT Time Calculation (min)  46 min    Equipment Utilized During Treatment  Other (comment)   B AFOs   Activity Tolerance  Patient tolerated treatment well    Behavior During Therapy  [facility] for tasks assessed/performed       Past Medical History:  Diagnosis Date  . Abnormal brain MRI    Right side  . Basal cell carcinoma of back   . BPH (benign prostatic hyperplasia)   . Concussion 1950   Baseball injury  . Foot drop, bilateral 05/02/2013  . Gait disturbance   . Gastroesophageal reflux disease   . Hyperlipidemia   . Hypertension   . Lumbago   . Peripheral neuropathy   . Skull fracture [facility])     Past Surgical History:  Procedure Laterality Date  . CATARACT EXTRACTION Bilateral   . KNEE SURGERY Left   . skin cancer resection     basal cell, [place] carcinoma  . TONSILLECTOMY    . VASECTOMY      There were no vitals filed for this visit.  Subjective Assessment - 05/21/18 1322    Subjective  Pt reports doing well.  Does not have braces on today and using quad tip cane.     Pertinent History  Jan 14th was last fall with back injury (MRI 1/31).      Limitations  Standing;Walking    How long can you walk comfortably?  Reports he can walk a while, but is wobbly.     Patient Stated Goals  "to improve my balance"    Currently in Pain?   No/denies            Self care:  Pt presents today without B AFOs and also with broken elastic shoe lace.  He also ambulates into clinic with quad tip cane.  Educated on importance of wearing B AFOs to reduce fall risk and that if he does forget AFO's he should likely be using RW for improved support.  Also educated on lacing up shoes one more islet as this will help seat pts foot in shoe better to avoid heel movement when he is wearing AFOs.  Educated on use of elastic shoe laces vs regular shoe laces with use of shoe button (PT demo'd to them during session).  Pt and son in law verbalized understanding.  Pt continues to report inconsistent performance of HEP due to being busy at home.  Continue to educate on importance of carryover at home to maximize progress.    Gait:  Performed all gait without AFOs and with quad tip cane.  Set up obstacles during session to simulate church pews while also carrying plate with items on it (simulating offering plate).  Had pt hand plate to son  in law reaching forward to retrieve then moving to next "pew" placing plate on chair and then picking up again x 2 sets of 4 reps.  Progressed to having pt actually move in between "pews" to place and retrieve plate then move out of pews to next one.  Note that he tended to turn all the way around, not using cane and having several mild LOB in which he needed to lean on "pew" to get balance.  Educated and demonstrated side stepping while using cane in between "pews" to allow for more balance and stability.  Pt returned demo well during session.  Continued with task by having pt side step between chairs then moving forward to next chair with quad tip cane.  No LOB during task.  Progressed to gait in clinic x 300' while negotiating around obstacles, in busy areas and through tight spaces.  Pt needs intermittent min/guard A, but overall did well.  Had one large LOB when retrieving plate for church task in which coming out of ADL  kitchen, running into door frame to the R.  Min A to correct.    NMR:  In corner:  Standing on small rocker board vertically biased with feet apart maintaining balance x 3 sets of 20 secs without UE support.  Cues for improved hip strategy.  Progressed to standing in WIDE stance and EC x 5 reps of 5-6 secs.  Pt tends to fall posteriorly immediately without ability to correct.  He did have 2 very small instances of hip strategy, however over corrected causing forward LOB.  Standing on black balance beam with single UE support on counter and HHA on opposite side stepping forward alternating LEs x 10 reps, then retro stepping x 10 reps.   Cues and facilitation for improved forward weight shift to maintain hips over feet in stance.                       PT Short Term Goals - 05/09/18 1111      PT SHORT TERM GOAL #1   Title  Pt will initiate HEP in order to indicate decreased fall risk and improved functional mobility.  (Target Date: 05/11/18)    Baseline  reports not performing at home     Time  4    Period  Weeks    Status  Not Met    Target Date  05/11/18      PT SHORT TERM GOAL #2   Title  Pt will improve DGI to >/=17/24 in order to indicate decreased fall risk.     Baseline  14/24 on 05/09/18, 1 point decline     Time  4    Period  Weeks    Status  Not Met      PT SHORT TERM GOAL #3   Title  Pt will perform 5TSS in </=13 secs without UE support with decreased posterior bias to indicate decreased fall risk.     Baseline  15.19 secs without UE support 05/09/18    Time  4    Period  Weeks    Status  Partially Met      PT SHORT TERM GOAL #4   Title  Pt will ambulate x 200' over indoor surfaces w/ LRAD, negotiating turns and through tight spaces without overt LOB in order to indicate safe home negotiation.     Baseline  continues to require intermittent min/guard to min A when turning    Time  4  Period  Weeks    Status  Not Met        PT Long Term Goals - 04/11/18  1524      PT LONG TERM GOAL #1   Title  Pt will be independent with final HEP in order to indicate decreased fall risk and imroved functional mobility. (Target Date: 06/10/18)    Time  8    Period  Weeks    Status  New    Target Date  06/10/18      PT LONG TERM GOAL #2   Title  Pt will improve DGI to >/=19/24 in order to indicate decreased fall risk.     Time  8    Period  Weeks    Status  New      PT LONG TERM GOAL #3   Title  Pt will improve 5TSS to </=11 secs without UE support without posterior bias in order to indicate decreased fall risk.      Time  8    Period  Weeks    Status  New      PT LONG TERM GOAL #4   Title  Pt will improve gait sped to >/=3.22 ft/sec w/ LRAD at mod I level in order to indicate decreased fall risk and improved efficiency of gait.     Time  8    Period  Weeks    Status  New      PT LONG TERM GOAL #5   Title  Pt will ambulate over unlevel paved outdoor surfaces (ramp/curb) x 500' w/ LRAD while scanning environment at mod I level in order to indicate safe community ambulation.     Time  8    Period  Weeks    Status  New            Plan - 05/21/18 1406    Clinical Impression Statement  Skilled session focused on gait with quad tip cane to simulate negotiation in and around church pews while holding simulated offering plate.  Pt had most difficulty when in between pews and then getting back out to [facility], therefore recommended side stepping with quad tip cane with improvement noted, but needed continuous cues to actually place quad tip cane down instead of picking it up.  He also continues to tend to cut to the right too close to obstacles.  Educated pt and son in law on safe maneuvering.      Rehab Potential  Good    Clinical Impairments Affecting Rehab Potential  motivated to improve balance, unknown injury from fall?    PT Frequency  2x / week    PT Duration  8 weeks    PT Treatment/Interventions  ADLs/Self Care Home Management;DME  Instruction;Gait training;Stair training;Functional mobility training;Therapeutic activities;Therapeutic exercise;Balance training;Neuromuscular re-education;Patient/family education;Orthotic Fit/Training;Passive range of motion;Vestibular    PT Next Visit Plan  10th visit progress note!! visual compensatory tasks when turning, Keep working on safe gait with cane - curb, ramp, stairs, turns/weaving.  Pt is still interested in ushering at church (passing the offering plate to each aisle-walking sideways/backwards over carpet).  multi sensory balance training.      Consulted and Agree with Plan of Care  Patient;Family member/caregiver    Family Member Consulted  son in law       Patient will benefit from skilled therapeutic intervention in order to improve the following deficits and impairments:  Decreased balance, Abnormal gait, Decreased coordination, Decreased knowledge of precautions, Decreased knowledge of use of  DME, Decreased mobility, Decreased safety awareness, Decreased strength, Dizziness, Impaired perceived functional ability, Impaired flexibility, Impaired sensation, Postural dysfunction  Visit Diagnosis: Repeated falls  Unsteadiness on feet  Muscle weakness (generalized)  Other abnormalities of gait and mobility  Other disturbances of skin sensation     Problem List Patient Active Problem List   Diagnosis Date Noted  . Foot drop, bilateral 05/02/2013  . SDH (subdural hematoma) ([place]) 04/30/2013  . Polyneuropathy 04/30/2013  . Skull fracture ([place]) 04/30/2013  . Thoracic or lumbosacral neuritis or radiculitis, unspecified 03/18/2012  . Hereditary and idiopathic peripheral neuropathy 03/18/2012  . Abnormality of gait 03/18/2012  . Concussion with no loss of consciousness 03/18/2012  . Other and unspecified hyperlipidemia 03/18/2012  . Unspecified essential hypertension 03/18/2012    Cameron Sprang, PT, MPT [facility] [address]  [place], [place], [address] Phone: [phone]   Fax:  [phone] 05/21/18, 2:25 PM  Name: ARSAL TAPPAN MRN: [id number] Date of Birth: 14-Dec-1931

## 2018-05-23 ENCOUNTER — Ambulatory Visit: Payer: Medicare Other | Admitting: Physical Therapy

## 2018-05-23 ENCOUNTER — Encounter: Payer: Self-pay | Admitting: Physical Therapy

## 2018-05-23 DIAGNOSIS — R2681 Unsteadiness on feet: Secondary | ICD-10-CM

## 2018-05-23 DIAGNOSIS — R2689 Other abnormalities of gait and mobility: Secondary | ICD-10-CM

## 2018-05-23 DIAGNOSIS — R208 Other disturbances of skin sensation: Secondary | ICD-10-CM

## 2018-05-23 DIAGNOSIS — M6281 Muscle weakness (generalized): Secondary | ICD-10-CM

## 2018-05-23 DIAGNOSIS — R296 Repeated falls: Secondary | ICD-10-CM

## 2018-05-23 NOTE — Therapy (Signed)
[facility] [address] [place], [place], [address] Phone: [phone]   Fax:  [phone]  Physical Therapy Treatment and 10th visit Progress Note  Patient Details  Name: Timothy Khan MRN: [id number] Date of Birth: January 21, 1932 Referring Provider (PT): [name], MD (declining balance since fall in 2015)   Encounter Date: 05/23/2018   10th Visit Physical Therapy Progress Note  Dates of Reporting Period: 04/11/2018 to 05/23/2018   PT End of Session - 05/23/18 1617    Visit Number  10    Number of Visits  17    Date for PT Re-Evaluation  16/38/45   cert written for 90 days, POC for 60 days   Authorization Type  [employer] Medicare - 10th visit PN needed    PT Start Time  1404    PT Stop Time  1447    PT Time Calculation (min)  43 min    Equipment Utilized During Treatment  Other (comment)   B AFOs   Activity Tolerance  Patient tolerated treatment well    Behavior During Therapy  [facility] for tasks assessed/performed       Past Medical History:  Diagnosis Date  . Abnormal brain MRI    Right side  . Basal cell carcinoma of back   . BPH (benign prostatic hyperplasia)   . Concussion 1950   Baseball injury  . Foot drop, bilateral 05/02/2013  . Gait disturbance   . Gastroesophageal reflux disease   . Hyperlipidemia   . Hypertension   . Lumbago   . Peripheral neuropathy   . Skull fracture [facility])     Past Surgical History:  Procedure Laterality Date  . CATARACT EXTRACTION Bilateral   . KNEE SURGERY Left   . skin cancer resection     basal cell, [place] carcinoma  . TONSILLECTOMY    . VASECTOMY      There were no vitals filed for this visit.  Subjective Assessment - 05/23/18 1408    Subjective  Pt has bilat AFO on today; feels more wobbly with cane.    Pertinent History  Jan 14th was last fall with back injury (MRI 1/31).      Limitations  Standing;Walking    How long can you walk comfortably?  Reports he can  walk a while, but is wobbly.     Patient Stated Goals  "to improve my balance"    Currently in Pain?  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  Pt had back turned to back of pew in front of him and facing people in pew so that back of pew could support him if he experienced a posterior LOB and didn't fall into people sitting in pew.    Ambulation Distance (Feet)  115 Feet    Assistive device  Straight cane    Ambulation Surface  Level;Indoor      Therapeutic Activites    Therapeutic Activities  Other Therapeutic Activities    Other Therapeutic Activities  Pt continues to feel that his quad tip is tilted and not stable; compared it to one of the clinic's canes with a quad tip.  Quad tips noted to be same size.  Continued to educate pt that in order to keep the cane stable he needs to place all 4 feet on the ground beside him and put pressure through the middle of the cane instead of tipping the cane.            Balance Exercises - 05/23/18 1428      Balance Exercises: Standing   Step Over Hurdles / Cones  Performed stepping over low obstacles with floor ladder forwards and backwards with cane x 2 sets and then with side stepping to L and R with cane x 2 sets with min A with pt self correcting balance 50% of the time and clearing foot 25% of the time.  Pt had greater difficulty weight shifting to R.          PT Short Term Goals - 05/09/18 1111      PT SHORT TERM GOAL #1   Title  Pt will initiate HEP in order to indicate decreased fall risk and improved functional mobility.   (Target Date: 05/11/18)    Baseline  reports not performing at home     Time  4    Period  Weeks    Status  Not Met    Target Date  05/11/18      PT SHORT TERM GOAL #2   Title  Pt will improve DGI to >/=17/24 in order to indicate decreased fall risk.     Baseline  14/24 on 05/09/18, 1 point decline     Time  4    Period  Weeks    Status  Not Met      PT SHORT TERM GOAL #3   Title  Pt will perform 5TSS in </=13 secs without UE support with decreased posterior bias to indicate decreased fall risk.     Baseline  15.19 secs without UE support 05/09/18    Time  4    Period  Weeks    Status  Partially Met      PT SHORT TERM GOAL #4   Title  Pt will ambulate x 200' over indoor surfaces w/ LRAD, negotiating turns and through tight spaces without overt LOB in order to indicate safe home negotiation.     Baseline  continues to require intermittent min/guard to min A when turning    Time  4    Period  Weeks    Status  Not Met        PT Long Term Goals - 04/11/18 1524      PT LONG TERM GOAL #1   Title  Pt will be independent with final HEP in order to indicate decreased fall risk and imroved functional mobility. (Target Date: 06/10/18)    Time  8    Period  Weeks    Status  New    Target Date  06/10/18      PT LONG TERM GOAL #2   Title  Pt will improve DGI to >/=19/24 in order to indicate decreased fall risk.     Time  8    Period  Weeks    Status  New      PT LONG TERM GOAL #3   Title  Pt will improve 5TSS to </=11 secs without UE support without posterior bias in order to indicate decreased fall  risk.      Time  8    Period  Weeks    Status  New      PT LONG TERM GOAL #4   Title  Pt will improve gait sped to >/=3.22 ft/sec w/ LRAD at mod I level in order to indicate decreased fall risk and improved efficiency of gait.     Time  8    Period  Weeks    Status  New      PT LONG TERM GOAL #5   Title  Pt will ambulate over unlevel paved outdoor surfaces (ramp/curb) x 500' w/  LRAD while scanning environment at mod I level in order to indicate safe community ambulation.     Time  8    Period  Weeks    Status  New            Plan - 05/23/18 1619    Clinical Impression Statement  Continued to review safe gait in narrow spaces simulating church pews while holding cane and offering plate with loose change - pt demonstrated improved safety/sequencing of side stepping with cane.  Continued gait training with stepping over low obstacles forwards, backwards and laterally to increase foot clearance, balance during SLS and increased stance time and improve safety with gait.  Pt is demonstrating progress towards goals and reports no falls since beginning therapy.     Rehab Potential  Good    Clinical Impairments Affecting Rehab Potential  motivated to improve balance, unknown injury from fall?    PT Frequency  2x / week    PT Duration  8 weeks    PT Treatment/Interventions  ADLs/Self Care Home Management;DME Instruction;Gait training;Stair training;Functional mobility training;Therapeutic activities;Therapeutic exercise;Balance training;Neuromuscular re-education;Patient/family education;Orthotic Fit/Training;Passive range of motion;Vestibular    PT Next Visit Plan  visual compensatory tasks when turning, Keep working on safe gait with cane - curb, ramp, stairs, turns/weaving, stepping over obstacles in variety of directions    Consulted and Agree with Plan of Care  Patient;Family member/caregiver    Family Member Consulted  daughter       Patient will benefit from skilled therapeutic intervention in order to improve the following deficits and impairments:  Decreased balance, Abnormal gait, Decreased coordination, Decreased knowledge of precautions, Decreased knowledge of use of DME, Decreased mobility, Decreased safety awareness, Decreased strength, Dizziness, Impaired perceived functional ability, Impaired flexibility, Impaired sensation, Postural dysfunction  Visit  Diagnosis: Repeated falls  Unsteadiness on feet  Muscle weakness (generalized)  Other abnormalities of gait and mobility  Other disturbances of skin sensation     Problem List Patient Active Problem List   Diagnosis Date Noted  . Foot drop, bilateral 05/02/2013  . SDH (subdural hematoma) ([place]) 04/30/2013  . Polyneuropathy 04/30/2013  . Skull fracture ([place]) 04/30/2013  . Thoracic or lumbosacral neuritis or radiculitis, unspecified 03/18/2012  . Hereditary and idiopathic peripheral neuropathy 03/18/2012  . Abnormality of gait 03/18/2012  . Concussion with no loss of consciousness 03/18/2012  . Other and unspecified hyperlipidemia 03/18/2012  . Unspecified essential hypertension 03/18/2012   Rico Junker, PT, [name] 05/23/18    4:24 PM    [facility] [address] [place], [place], [address] Phone: [phone]   Fax:  [phone]  Name: ZEDDIE NJIE MRN: [id number] Date of Birth: [birth year]

## 2018-05-27 ENCOUNTER — Encounter: Payer: Self-pay | Admitting: Rehabilitation

## 2018-05-27 ENCOUNTER — Ambulatory Visit: Payer: Medicare Other | Admitting: Rehabilitation

## 2018-05-27 DIAGNOSIS — R296 Repeated falls: Secondary | ICD-10-CM | POA: Diagnosis not present

## 2018-05-27 DIAGNOSIS — R2689 Other abnormalities of gait and mobility: Secondary | ICD-10-CM

## 2018-05-27 DIAGNOSIS — M6281 Muscle weakness (generalized): Secondary | ICD-10-CM

## 2018-05-27 DIAGNOSIS — R2681 Unsteadiness on feet: Secondary | ICD-10-CM

## 2018-05-27 NOTE — Therapy (Signed)
[facility] [address] [place], [place], [address] Phone: [phone]   Fax:  [phone]  Physical Therapy Treatment  Patient Details  Name: Timothy Khan MRN: [id number] Date of Birth: 1932/03/10 Referring Provider (PT): [name], MD (declining balance since fall in 2015)   Encounter Date: 05/27/2018  PT End of Session - 05/27/18 1501    Visit Number  11    Number of Visits  17    Date for PT Re-Evaluation  64/33/29   cert written for 90 days, POC for 60 days   Authorization Type  [employer] Medicare - 10th visit PN needed    PT Start Time  1400    PT Stop Time  1446    PT Time Calculation (min)  46 min    Equipment Utilized During Treatment  Other (comment)   B AFOs   Activity Tolerance  Patient tolerated treatment well    Behavior During Therapy  [facility] for tasks assessed/performed       Past Medical History:  Diagnosis Date  . Abnormal brain MRI    Right side  . Basal cell carcinoma of back   . BPH (benign prostatic hyperplasia)   . Concussion 1950   Baseball injury  . Foot drop, bilateral 05/02/2013  . Gait disturbance   . Gastroesophageal reflux disease   . Hyperlipidemia   . Hypertension   . Lumbago   . Peripheral neuropathy   . Skull fracture [facility])     Past Surgical History:  Procedure Laterality Date  . CATARACT EXTRACTION Bilateral   . KNEE SURGERY Left   . skin cancer resection     basal cell, [place] carcinoma  . TONSILLECTOMY    . VASECTOMY      There were no vitals filed for this visit.  Subjective Assessment - 05/27/18 1404    Subjective  Pt reports feeling more balanced today and stronger.     Pertinent History  Jan 14th was last fall with back injury (MRI 1/31).      Limitations  Standing;Walking    How long can you walk comfortably?  Reports he can walk a while, but is wobbly.     Patient Stated Goals  "to improve my balance"    Currently in Pain?  No/denies                        [facility] Adult PT Treatment/Exercise - 05/27/18 1427      Ambulation/Gait   Ambulation/Gait  Yes    Ambulation/Gait Assistance  5: Supervision;6: Modified independent (Device/Increase time)    Ambulation/Gait Assistance Details  Pt ambulatory with quad tip cane today.  Note that he was wearing B AFOs with hiking boots which seemed to provide much better support than regular tennis shoes.  Discussed how these could be more supportive.  Performed gait around track progressing to walking between tables, mats, obstacles.   Pt did much better with side stepping and did not veer to close to the R today.   Did several bouts of gait during active rest.     Ambulation Distance (Feet)  500 Feet    Assistive device  Straight cane    Gait Pattern  Step-through pattern;Decreased stride length;Decreased dorsiflexion - right;Decreased dorsiflexion - left;Lateral hip instability;Trunk flexed;Wide base of support    Ambulation Surface  Level;Indoor      High Level Balance   High Level Balance Activities  Backward walking;Direction changes;Turns;Sudden stops  High Level Balance Comments  In // bars, performed standing on foam balance beam with feet apart maintaining balance x 2 sets of 10 reps-continues to demonstrate poor hip strategy, therefore wanted to address posterior step strategy as he tends to lose balance posteriorly.  While on foam beam without UE support (intermittently needed support) stepping posteirorly x 15 reps, 1/4 turns over obstacle x 10 reps each direction with cue for single step-intermittent UE support but pt did VERY well with this task today.  High level gait/balance with forwards gait,  backwards gait, sudden turns x 300' with quad tip cane.  While standing on red therapy mat for slight compliant surface without use of cane marching forward x 5 laps-cues for improved forward weight shift onto stance leg as this greatly improved stability.        Exercises    Exercises  Other Exercises    Other Exercises   SL hip abd x 10 reps on each side with max effort from PT to maintain hip in alignment.  Note marked weakness on RLE.              PT Education - 05/27/18 1500    Education Details  possibility of shoes creating more stability with AFOs    Person(s) Educated  Patient;Child(ren)    Methods  Explanation    Comprehension  Verbalized understanding       PT Short Term Goals - 05/09/18 1111      PT SHORT TERM GOAL #1   Title  Pt will initiate HEP in order to indicate decreased fall risk and improved functional mobility.  (Target Date: 05/11/18)    Baseline  reports not performing at home     Time  4    Period  Weeks    Status  Not Met    Target Date  05/11/18      PT SHORT TERM GOAL #2   Title  Pt will improve DGI to >/=17/24 in order to indicate decreased fall risk.     Baseline  14/24 on 05/09/18, 1 point decline     Time  4    Period  Weeks    Status  Not Met      PT SHORT TERM GOAL #3   Title  Pt will perform 5TSS in </=13 secs without UE support with decreased posterior bias to indicate decreased fall risk.     Baseline  15.19 secs without UE support 05/09/18    Time  4    Period  Weeks    Status  Partially Met      PT SHORT TERM GOAL #4   Title  Pt will ambulate x 200' over indoor surfaces w/ LRAD, negotiating turns and through tight spaces without overt LOB in order to indicate safe home negotiation.     Baseline  continues to require intermittent min/guard to min A when turning    Time  4    Period  Weeks    Status  Not Met        PT Long Term Goals - 04/11/18 1524      PT LONG TERM GOAL #1   Title  Pt will be independent with final HEP in order to indicate decreased fall risk and imroved functional mobility. (Target Date: 06/10/18)    Time  8    Period  Weeks    Status  New    Target Date  06/10/18      PT LONG TERM GOAL #2  Title  Pt will improve DGI to >/=19/24 in order to indicate decreased fall risk.      Time  8    Period  Weeks    Status  New      PT LONG TERM GOAL #3   Title  Pt will improve 5TSS to </=11 secs without UE support without posterior bias in order to indicate decreased fall risk.      Time  8    Period  Weeks    Status  New      PT LONG TERM GOAL #4   Title  Pt will improve gait sped to >/=3.22 ft/sec w/ LRAD at mod I level in order to indicate decreased fall risk and improved efficiency of gait.     Time  8    Period  Weeks    Status  New      PT LONG TERM GOAL #5   Title  Pt will ambulate over unlevel paved outdoor surfaces (ramp/curb) x 500' w/ LRAD while scanning environment at mod I level in order to indicate safe community ambulation.     Time  8    Period  Weeks    Status  New            Plan - 05/27/18 1501    Clinical Impression Statement  Skilled session focused on high level gait with quad tip cane, balance to emphasize direction changes, compliant surfaces, hip and stepping strategy and hip strengthening.     Rehab Potential  Good    Clinical Impairments Affecting Rehab Potential  motivated to improve balance, unknown injury from fall?    PT Frequency  2x / week    PT Duration  8 weeks    PT Treatment/Interventions  ADLs/Self Care Home Management;DME Instruction;Gait training;Stair training;Functional mobility training;Therapeutic activities;Therapeutic exercise;Balance training;Neuromuscular re-education;Patient/family education;Orthotic Fit/Training;Passive range of motion;Vestibular    PT Next Visit Plan  Pt to D/C on 3/23, visual compensatory tasks when turning, Keep working on safe gait with cane - curb, ramp, stairs, turns/weaving, stepping over obstacles in variety of directions    Consulted and Agree with Plan of Care  Patient;Family member/caregiver    Family Member Consulted  daughter       Patient will benefit from skilled therapeutic intervention in order to improve the following deficits and impairments:  Decreased balance, Abnormal  gait, Decreased coordination, Decreased knowledge of precautions, Decreased knowledge of use of DME, Decreased mobility, Decreased safety awareness, Decreased strength, Dizziness, Impaired perceived functional ability, Impaired flexibility, Impaired sensation, Postural dysfunction  Visit Diagnosis: Repeated falls  Unsteadiness on feet  Muscle weakness (generalized)  Other abnormalities of gait and mobility     Problem List Patient Active Problem List   Diagnosis Date Noted  . Foot drop, bilateral 05/02/2013  . SDH (subdural hematoma) ([place]) 04/30/2013  . Polyneuropathy 04/30/2013  . Skull fracture ([place]) 04/30/2013  . Thoracic or lumbosacral neuritis or radiculitis, unspecified 03/18/2012  . Hereditary and idiopathic peripheral neuropathy 03/18/2012  . Abnormality of gait 03/18/2012  . Concussion with no loss of consciousness 03/18/2012  . Other and unspecified hyperlipidemia 03/18/2012  . Unspecified essential hypertension 03/18/2012    Cameron Sprang, PT, MPT [facility] [address] [place], [place], [address] Phone: [phone]   Fax:  [phone] 05/27/18, 3:02 PM  Name: Timothy Khan MRN: [id number] Date of Birth: May 19, 1931

## 2018-05-28 ENCOUNTER — Encounter: Payer: Self-pay | Admitting: Physical Therapy

## 2018-05-28 ENCOUNTER — Ambulatory Visit: Payer: Medicare Other | Admitting: Physical Therapy

## 2018-05-28 DIAGNOSIS — M6281 Muscle weakness (generalized): Secondary | ICD-10-CM

## 2018-05-28 DIAGNOSIS — R296 Repeated falls: Secondary | ICD-10-CM | POA: Diagnosis not present

## 2018-05-28 DIAGNOSIS — R2689 Other abnormalities of gait and mobility: Secondary | ICD-10-CM

## 2018-05-28 DIAGNOSIS — R2681 Unsteadiness on feet: Secondary | ICD-10-CM

## 2018-05-29 NOTE — Therapy (Signed)
[facility] [address] [place], [place], [address] Phone: [phone]   Fax:  [phone]  Physical Therapy Treatment  Patient Details  Name: Timothy Khan MRN: [id number] Date of Birth: 03/15/1932 Referring Provider (PT): [name], MD (declining balance since fall in 2015)   Encounter Date: 05/28/2018     05/28/18 0935  PT Visits / Re-Eval  Visit Number 12  Number of Visits 17  Date for PT Re-Evaluation 62/22/97 (cert written for 90 days, POC for 60 days)  Authorization  Authorization Type [employer] Medicare - 10th visit PN needed  PT Time Calculation  PT Start Time 0933  PT Stop Time 1013  PT Time Calculation (min) 40 min  PT - End of Session  Equipment Utilized During Treatment Other (comment);Gait belt (B AFOs)  Activity Tolerance Patient tolerated treatment well  Behavior During Therapy [facility] for tasks assessed/performed    Past Medical History:  Diagnosis Date  . Abnormal brain MRI    Right side  . Basal cell carcinoma of back   . BPH (benign prostatic hyperplasia)   . Concussion 1950   Baseball injury  . Foot drop, bilateral 05/02/2013  . Gait disturbance   . Gastroesophageal reflux disease   . Hyperlipidemia   . Hypertension   . Lumbago   . Peripheral neuropathy   . Skull fracture [facility])     Past Surgical History:  Procedure Laterality Date  . CATARACT EXTRACTION Bilateral   . KNEE SURGERY Left   . skin cancer resection     basal cell, [place] carcinoma  . TONSILLECTOMY    . VASECTOMY      There were no vitals filed for this visit.     05/28/18 0934  Symptoms/Limitations  Subjective No new complaints. To therapy with daughter today using cane with rubber quad tip on it. No falls or pain to report.   Pertinent History Jan 14th was last fall with back injury (MRI 1/31).    Limitations Standing;Walking  How long can you walk comfortably? Reports he can walk a while, but is wobbly.    Patient Stated Goals "to improve my balance"  Pain Assessment  Currently in Pain? No/denies  Pain Score 0      05/28/18 0935  Transfers  Transfers Sit to Stand;Stand to Sit  Sit to Stand 5: Supervision;With upper extremity assist;From bed;From chair/3-in-1  Stand to Sit 5: Supervision;With upper extremity assist;To bed;To chair/3-in-1  Ambulation/Gait  Ambulation/Gait Yes  Ambulation/Gait Assistance 5: Supervision;4: Min guard  Ambulation/Gait Assistance Details min guard assist on outdoor complaint surfaces  Ambulation Distance (Feet) 500 Feet (x1 in/outdoors, around gym with activity)  Assistive device Straight cane  Gait Pattern Step-through pattern;Decreased stride length;Decreased dorsiflexion - right;Decreased dorsiflexion - left;Lateral hip instability;Trunk flexed;Wide base of support  Ambulation Surface Level;Indoor;Unlevel;Outdoor;Paved;Gravel;Grass  High Level Balance  High Level Balance Activities Marching forwards;Marching backwards;Tandem walking;Side stepping  High Level Balance Comments on red/blue mats next to counter top: 3 laps each with cues on technique and form. min assist for balance.        05/28/18 1004  Balance Exercises: Standing  Standing Eyes Opened Wide (BOA);Head turns;Foam/compliant surface;Other reps (comment);Limitations  Standing Eyes Closed Wide (BOA);Foam/compliant surface;3 reps;30 secs;Limitations  Balance Beam on blue beam in parallel bars: alternating fwd stepping to floor/back onto beam, then alternating bwd stepping/back onto beam. light touch to bars, min guard to min assist for balance with cues on step length/step height needed.   Other Standing Exercises sitting  with feet across red beamd: sit to stands x 10 reps, light UE support, cues for full standing and slow, controlled descent with min guard to min assist for balance.   Balance Exercises: Standing  Standing Eyes Closed Limitations standing across blue foam beam: EC no head  movements with up to mod assist for balance with UE support to no UE support.l   Standing Eyes Opened Limitations standing across blue foam beam: EO head movements left<>right, then up<>down with light fingertip support on bars, min guard to min assist for balance.         PT Short Term Goals - 05/09/18 1111      PT SHORT TERM GOAL #1   Title  Pt will initiate HEP in order to indicate decreased fall risk and improved functional mobility.  (Target Date: 05/11/18)    Baseline  reports not performing at home     Time  4    Period  Weeks    Status  Not Met    Target Date  05/11/18      PT SHORT TERM GOAL #2   Title  Pt will improve DGI to >/=17/24 in order to indicate decreased fall risk.     Baseline  14/24 on 05/09/18, 1 point decline     Time  4    Period  Weeks    Status  Not Met      PT SHORT TERM GOAL #3   Title  Pt will perform 5TSS in </=13 secs without UE support with decreased posterior bias to indicate decreased fall risk.     Baseline  15.19 secs without UE support 05/09/18    Time  4    Period  Weeks    Status  Partially Met      PT SHORT TERM GOAL #4   Title  Pt will ambulate x 200' over indoor surfaces w/ LRAD, negotiating turns and through tight spaces without overt LOB in order to indicate safe home negotiation.     Baseline  continues to require intermittent min/guard to min A when turning    Time  4    Period  Weeks    Status  Not Met        PT Long Term Goals - 04/11/18 1524      PT LONG TERM GOAL #1   Title  Pt will be independent with final HEP in order to indicate decreased fall risk and imroved functional mobility. (Target Date: 06/10/18)    Time  8    Period  Weeks    Status  New    Target Date  06/10/18      PT LONG TERM GOAL #2   Title  Pt will improve DGI to >/=19/24 in order to indicate decreased fall risk.     Time  8    Period  Weeks    Status  New      PT LONG TERM GOAL #3   Title  Pt will improve 5TSS to </=11 secs without UE support  without posterior bias in order to indicate decreased fall risk.      Time  8    Period  Weeks    Status  New      PT LONG TERM GOAL #4   Title  Pt will improve gait sped to >/=3.22 ft/sec w/ LRAD at mod I level in order to indicate decreased fall risk and improved efficiency of gait.     Time  8  Period  Weeks    Status  New      PT LONG TERM GOAL #5   Title  Pt will ambulate over unlevel paved outdoor surfaces (ramp/curb) x 500' w/ LRAD while scanning environment at mod I level in order to indicate safe community ambulation.     Time  8    Period  Weeks    Status  New         05/28/18 0935  Plan  Clinical Impression Statement Today's skilled session focused on gait on various surfaces and balance reactions with min to mod assist for balance needed.   Pt will benefit from skilled therapeutic intervention in order to improve on the following deficits Decreased balance;Abnormal gait;Decreased coordination;Decreased knowledge of precautions;Decreased knowledge of use of DME;Decreased mobility;Decreased safety awareness;Decreased strength;Dizziness;Impaired perceived functional ability;Impaired flexibility;Impaired sensation;Postural dysfunction  Rehab Potential Good  Clinical Impairments Affecting Rehab Potential motivated to improve balance, unknown injury from fall?  PT Frequency 2x / week  PT Duration 8 weeks  PT Treatment/Interventions ADLs/Self Care Home Management;DME Instruction;Gait training;Stair training;Functional mobility training;Therapeutic activities;Therapeutic exercise;Balance training;Neuromuscular re-education;Patient/family education;Orthotic Fit/Training;Passive range of motion;Vestibular  PT Next Visit Plan Pt to D/C on 3/23, visual compensatory tasks when turning, Keep working on safe gait with cane - curb, ramp, stairs, turns/weaving, stepping over obstacles in variety of directions  Consulted and Agree with Plan of Care Patient;Family member/caregiver  Family  Member Consulted daughter         Patient will benefit from skilled therapeutic intervention in order to improve the following deficits and impairments:  Decreased balance, Abnormal gait, Decreased coordination, Decreased knowledge of precautions, Decreased knowledge of use of DME, Decreased mobility, Decreased safety awareness, Decreased strength, Dizziness, Impaired perceived functional ability, Impaired flexibility, Impaired sensation, Postural dysfunction  Visit Diagnosis: Repeated falls  Unsteadiness on feet  Muscle weakness (generalized)  Other abnormalities of gait and mobility     Problem List Patient Active Problem List   Diagnosis Date Noted  . Foot drop, bilateral 05/02/2013  . SDH (subdural hematoma) ([place]) 04/30/2013  . Polyneuropathy 04/30/2013  . Skull fracture ([place]) 04/30/2013  . Thoracic or lumbosacral neuritis or radiculitis, unspecified 03/18/2012  . Hereditary and idiopathic peripheral neuropathy 03/18/2012  . Abnormality of gait 03/18/2012  . Concussion with no loss of consciousness 03/18/2012  . Other and unspecified hyperlipidemia 03/18/2012  . Unspecified essential hypertension 03/18/2012    Willow Ora, PTA, [facility] Outpatient Neuro [facility] [address] [place], [place] [address] [phone] 05/29/18, 8:59 PM   Name: Timothy Khan MRN: [id number] Date of Birth: 1931/09/14

## 2018-06-04 ENCOUNTER — Encounter: Payer: Self-pay | Admitting: Physical Therapy

## 2018-06-04 ENCOUNTER — Ambulatory Visit: Payer: Medicare Other | Admitting: Physical Therapy

## 2018-06-04 DIAGNOSIS — K859 Acute pancreatitis without necrosis or infection, unspecified: Secondary | ICD-10-CM | POA: Diagnosis not present

## 2018-06-04 DIAGNOSIS — R2689 Other abnormalities of gait and mobility: Secondary | ICD-10-CM

## 2018-06-04 DIAGNOSIS — R2681 Unsteadiness on feet: Secondary | ICD-10-CM

## 2018-06-04 DIAGNOSIS — R1013 Epigastric pain: Secondary | ICD-10-CM | POA: Diagnosis not present

## 2018-06-04 NOTE — Patient Instructions (Addendum)
Turning in Place: Solid Surface    Standing in place, lead with head and turn slowly making quarter turns toward left.  With the direction you are turning, lead with that leg.  Repeat with quarter/half turns. Repeat __3__ times per session. Do __2__ sessions per day.   Copyright  VHI. All rights reserved.

## 2018-06-04 NOTE — Therapy (Signed)
[facility] [address] [place], [place], [address] Phone: [phone]   Fax:  [phone]  Physical Therapy Treatment  Patient Details  Name: Timothy Khan MRN: [id number] Date of Birth: [birth year] Referring Provider (PT): [name], MD (declining balance since fall in 2015)   Encounter Date: 06/04/2018  PT End of Session - 06/04/18 1136    Visit Number  13    Number of Visits  17    Date for PT Re-Evaluation  03/47/42   cert written for 90 days, POC for 60 days   Authorization Type  [employer] Medicare - 10th visit PN needed    PT Start Time  0848    PT Stop Time  0929    PT Time Calculation (min)  41 min    Equipment Utilized During Treatment  Other (comment);Gait belt   B AFOs   Activity Tolerance  Patient tolerated treatment well    Behavior During Therapy  [facility] for tasks assessed/performed       Past Medical History:  Diagnosis Date  . Abnormal brain MRI    Right side  . Basal cell carcinoma of back   . BPH (benign prostatic hyperplasia)   . Concussion 1950   Baseball injury  . Foot drop, bilateral 05/02/2013  . Gait disturbance   . Gastroesophageal reflux disease   . Hyperlipidemia   . Hypertension   . Lumbago   . Peripheral neuropathy   . Skull fracture [facility])     Past Surgical History:  Procedure Laterality Date  . CATARACT EXTRACTION Bilateral   . KNEE SURGERY Left   . skin cancer resection     basal cell, [place] carcinoma  . TONSILLECTOMY    . VASECTOMY      There were no vitals filed for this visit.  Subjective Assessment - 06/04/18 0849    Subjective  No falls, still want to try to cross-over when I take a step at the kitchen counter.  Still a little more tendency to go backwards.    Pertinent History  Jan 14th was last fall with back injury (MRI 1/31).      Limitations  Standing;Walking    How long can you walk comfortably?  Reports he can walk a while, but is wobbly.     Patient  Stated Goals  "to improve my balance"    Currently in Pain?  No/denies                       [facility] Adult PT Treatment/Exercise - 06/04/18 0001      Transfers   Transfers  Sit to Stand;Stand to Sit    Sit to Stand  5: Supervision;With upper extremity assist;From bed;From chair/3-in-1    Sit to Stand Details  Verbal cues for technique;Verbal cues for sequencing   Cues for increased forward lean   Five time sit to stand comments   16.65   no UE support, 1 posterior lean   Stand to Sit  5: Supervision;With upper extremity assist;To bed;To chair/3-in-1    Number of Reps  2 sets;Other reps (comment)   5 reps from mat;    Comments  Additional 2 sets of sit<>stand from chair, additional cues for forward lean, "nose over toes" to initiate forward lean (last set, pt has no posterior bias)      Ambulation/Gait   Ambulation/Gait  Yes    Ambulation/Gait Assistance  5: Supervision    Ambulation Distance (Feet)  [place] device  Straight cane   rubber quad tip   Gait Pattern  Step-through pattern;Decreased stride length;Decreased dorsiflexion - right;Decreased dorsiflexion - left;Lateral hip instability;Trunk flexed;Wide base of support    Ambulation Surface  Level;Indoor    Gait Comments  Worked on dynamic gait activities, including gait with head turns x 20 ft, gait with head nods x 20 ft (mild slowing using cane); gait with 180 turn and stop, 3 reps.          Balance Exercises - 06/04/18 0859      Balance Exercises: Standing   Stepping Strategy  Lateral;Foam/compliant surface;Anterior;Posterior;10 reps;UE support   Step up and over laterally, 10 reps, cues to widen BOS   Sidestepping  2 reps   R and L along counter-cues for foot clearance   Other Standing Exercises  Practiced turning strategies, including quarter turns (to complete 180 degree turns), R and L directions x 5 reps. cues to lead with foot in direction he is going, cues for head turn to look to  visual target, then turn        PT Education - 06/04/18 1136    Education Details  Addition to HEP for turning technique    Person(s) Educated  Patient    Methods  Explanation;Demonstration;Handout;Verbal cues    Comprehension  Verbalized understanding;Returned demonstration;Verbal cues required       PT Short Term Goals - 05/09/18 1111      PT SHORT TERM GOAL #1   Title  Pt will initiate HEP in order to indicate decreased fall risk and improved functional mobility.  (Target Date: 05/11/18)    Baseline  reports not performing at home     Time  4    Period  Weeks    Status  Not Met    Target Date  05/11/18      PT SHORT TERM GOAL #2   Title  Pt will improve DGI to >/=17/24 in order to indicate decreased fall risk.     Baseline  14/24 on 05/09/18, 1 point decline     Time  4    Period  Weeks    Status  Not Met      PT SHORT TERM GOAL #3   Title  Pt will perform 5TSS in </=13 secs without UE support with decreased posterior bias to indicate decreased fall risk.     Baseline  15.19 secs without UE support 05/09/18    Time  4    Period  Weeks    Status  Partially Met      PT SHORT TERM GOAL #4   Title  Pt will ambulate x 200' over indoor surfaces w/ LRAD, negotiating turns and through tight spaces without overt LOB in order to indicate safe home negotiation.     Baseline  continues to require intermittent min/guard to min A when turning    Time  4    Period  Weeks    Status  Not Met        PT Long Term Goals - 04/11/18 1524      PT LONG TERM GOAL #1   Title  Pt will be independent with final HEP in order to indicate decreased fall risk and imroved functional mobility. (Target Date: 06/10/18)    Time  8    Period  Weeks    Status  New    Target Date  06/10/18      PT LONG TERM GOAL #2  Title  Pt will improve DGI to >/=19/24 in order to indicate decreased fall risk.     Time  8    Period  Weeks    Status  New      PT LONG TERM GOAL #3   Title  Pt will improve 5TSS  to </=11 secs without UE support without posterior bias in order to indicate decreased fall risk.      Time  8    Period  Weeks    Status  New      PT LONG TERM GOAL #4   Title  Pt will improve gait sped to >/=3.22 ft/sec w/ LRAD at mod I level in order to indicate decreased fall risk and improved efficiency of gait.     Time  8    Period  Weeks    Status  New      PT LONG TERM GOAL #5   Title  Pt will ambulate over unlevel paved outdoor surfaces (ramp/curb) x 500' w/ LRAD while scanning environment at mod I level in order to indicate safe community ambulation.     Time  8    Period  Weeks    Status  New            Plan - 06/04/18 1137    Clinical Impression Statement  Today's skilled PT session focused on varied direction stepping on compliant surfaces for balance reactions, as well as turning techniques and transfers.  Pt needs reminder cues for technique and for slowed pace of turns, transfers.  Pt will continue to benefit from skilled PT to address balance, gait for improved mobility.    Rehab Potential  Good    Clinical Impairments Affecting Rehab Potential  motivated to improve balance, unknown injury from fall?    PT Frequency  2x / week    PT Duration  8 weeks    PT Treatment/Interventions  ADLs/Self Care Home Management;DME Instruction;Gait training;Stair training;Functional mobility training;Therapeutic activities;Therapeutic exercise;Balance training;Neuromuscular re-education;Patient/family education;Orthotic Fit/Training;Passive range of motion;Vestibular    PT Next Visit Plan  Pt to D/C on 3/23, review turn technique and visual compensatory tasks when turning, Keep working on safe gait with cane - curb, ramp, stairs, turns/weaving, stepping over obstacles in variety of directions    Consulted and Agree with Plan of Care  Patient;Family member/caregiver    Family Member Consulted  daughter       Patient will benefit from skilled therapeutic intervention in order to  improve the following deficits and impairments:  Decreased balance, Abnormal gait, Decreased coordination, Decreased knowledge of precautions, Decreased knowledge of use of DME, Decreased mobility, Decreased safety awareness, Decreased strength, Dizziness, Impaired perceived functional ability, Impaired flexibility, Impaired sensation, Postural dysfunction  Visit Diagnosis: Unsteadiness on feet  Other abnormalities of gait and mobility     Problem List Patient Active Problem List   Diagnosis Date Noted  . Foot drop, bilateral 05/02/2013  . SDH (subdural hematoma) ([place]) 04/30/2013  . Polyneuropathy 04/30/2013  . Skull fracture ([place]) 04/30/2013  . Thoracic or lumbosacral neuritis or radiculitis, unspecified 03/18/2012  . Hereditary and idiopathic peripheral neuropathy 03/18/2012  . Abnormality of gait 03/18/2012  . Concussion with no loss of consciousness 03/18/2012  . Other and unspecified hyperlipidemia 03/18/2012  . Unspecified essential hypertension 03/18/2012    MARRIOTT,AMY W. 06/04/2018, 11:39 AM  Frazier Butt., PT   [facility] [address] [place], [place], [address] Phone: [phone]   Fax:  [phone]  Name: Timothy Khan MRN: [id number] Date of Birth: Jun 23, 1931

## 2018-06-05 ENCOUNTER — Ambulatory Visit: Payer: Medicare Other | Admitting: Physical Therapy

## 2018-06-06 ENCOUNTER — Other Ambulatory Visit: Payer: Self-pay

## 2018-06-06 ENCOUNTER — Emergency Department (HOSPITAL_BASED_OUTPATIENT_CLINIC_OR_DEPARTMENT_OTHER): Payer: Medicare Other

## 2018-06-06 ENCOUNTER — Encounter (HOSPITAL_BASED_OUTPATIENT_CLINIC_OR_DEPARTMENT_OTHER): Payer: Self-pay | Admitting: Radiology

## 2018-06-06 ENCOUNTER — Inpatient Hospital Stay (HOSPITAL_BASED_OUTPATIENT_CLINIC_OR_DEPARTMENT_OTHER)
Admission: EM | Admit: 2018-06-06 | Discharge: 2018-06-15 | DRG: 439 | Disposition: A | Discharge status: Home Health | Payer: Medicare Other | Attending: Internal Medicine | Admitting: Internal Medicine

## 2018-06-06 DIAGNOSIS — E872 Acidosis: Secondary | ICD-10-CM | POA: Diagnosis present

## 2018-06-06 DIAGNOSIS — Z7951 Long term (current) use of inhaled steroids: Secondary | ICD-10-CM

## 2018-06-06 DIAGNOSIS — A419 Sepsis, unspecified organism: Secondary | ICD-10-CM | POA: Diagnosis not present

## 2018-06-06 DIAGNOSIS — M21372 Foot drop, left foot: Secondary | ICD-10-CM | POA: Diagnosis present

## 2018-06-06 DIAGNOSIS — D72829 Elevated white blood cell count, unspecified: Secondary | ICD-10-CM | POA: Diagnosis present

## 2018-06-06 DIAGNOSIS — I4891 Unspecified atrial fibrillation: Secondary | ICD-10-CM | POA: Diagnosis not present

## 2018-06-06 DIAGNOSIS — K859 Acute pancreatitis without necrosis or infection, unspecified: Secondary | ICD-10-CM | POA: Diagnosis present

## 2018-06-06 DIAGNOSIS — E785 Hyperlipidemia, unspecified: Secondary | ICD-10-CM | POA: Diagnosis present

## 2018-06-06 DIAGNOSIS — R1013 Epigastric pain: Secondary | ICD-10-CM | POA: Diagnosis present

## 2018-06-06 DIAGNOSIS — K5900 Constipation, unspecified: Secondary | ICD-10-CM | POA: Diagnosis not present

## 2018-06-06 DIAGNOSIS — K449 Diaphragmatic hernia without obstruction or gangrene: Secondary | ICD-10-CM | POA: Diagnosis present

## 2018-06-06 DIAGNOSIS — E876 Hypokalemia: Secondary | ICD-10-CM | POA: Diagnosis not present

## 2018-06-06 DIAGNOSIS — K625 Hemorrhage of anus and rectum: Secondary | ICD-10-CM | POA: Diagnosis present

## 2018-06-06 DIAGNOSIS — E871 Hypo-osmolality and hyponatremia: Secondary | ICD-10-CM | POA: Diagnosis not present

## 2018-06-06 DIAGNOSIS — K222 Esophageal obstruction: Secondary | ICD-10-CM | POA: Diagnosis present

## 2018-06-06 DIAGNOSIS — Z9889 Other specified postprocedural states: Secondary | ICD-10-CM

## 2018-06-06 DIAGNOSIS — R062 Wheezing: Secondary | ICD-10-CM

## 2018-06-06 DIAGNOSIS — I10 Essential (primary) hypertension: Secondary | ICD-10-CM

## 2018-06-06 DIAGNOSIS — K228 Other specified diseases of esophagus: Secondary | ICD-10-CM

## 2018-06-06 DIAGNOSIS — M21371 Foot drop, right foot: Secondary | ICD-10-CM | POA: Diagnosis present

## 2018-06-06 DIAGNOSIS — R000 Tachycardia, unspecified: Secondary | ICD-10-CM | POA: Diagnosis not present

## 2018-06-06 DIAGNOSIS — I472 Ventricular tachycardia: Secondary | ICD-10-CM | POA: Diagnosis not present

## 2018-06-06 DIAGNOSIS — R6520 Severe sepsis without septic shock: Secondary | ICD-10-CM | POA: Diagnosis not present

## 2018-06-06 DIAGNOSIS — N400 Enlarged prostate without lower urinary tract symptoms: Secondary | ICD-10-CM | POA: Diagnosis present

## 2018-06-06 DIAGNOSIS — J90 Pleural effusion, not elsewhere classified: Secondary | ICD-10-CM | POA: Diagnosis present

## 2018-06-06 DIAGNOSIS — R0902 Hypoxemia: Secondary | ICD-10-CM

## 2018-06-06 DIAGNOSIS — R9431 Abnormal electrocardiogram [ECG] [EKG]: Secondary | ICD-10-CM | POA: Diagnosis not present

## 2018-06-06 DIAGNOSIS — K210 Gastro-esophageal reflux disease with esophagitis: Secondary | ICD-10-CM | POA: Diagnosis present

## 2018-06-06 DIAGNOSIS — Z8249 Family history of ischemic heart disease and other diseases of the circulatory system: Secondary | ICD-10-CM

## 2018-06-06 DIAGNOSIS — Z87891 Personal history of nicotine dependence: Secondary | ICD-10-CM

## 2018-06-06 DIAGNOSIS — K317 Polyp of stomach and duodenum: Secondary | ICD-10-CM | POA: Diagnosis present

## 2018-06-06 DIAGNOSIS — R269 Unspecified abnormalities of gait and mobility: Secondary | ICD-10-CM | POA: Diagnosis present

## 2018-06-06 DIAGNOSIS — K2289 Other specified disease of esophagus: Secondary | ICD-10-CM

## 2018-06-06 DIAGNOSIS — I447 Left bundle-branch block, unspecified: Secondary | ICD-10-CM | POA: Diagnosis present

## 2018-06-06 DIAGNOSIS — Z885 Allergy status to narcotic agent status: Secondary | ICD-10-CM

## 2018-06-06 DIAGNOSIS — R188 Other ascites: Secondary | ICD-10-CM

## 2018-06-06 DIAGNOSIS — Z87820 Personal history of traumatic brain injury: Secondary | ICD-10-CM

## 2018-06-06 DIAGNOSIS — G609 Hereditary and idiopathic neuropathy, unspecified: Secondary | ICD-10-CM | POA: Diagnosis present

## 2018-06-06 DIAGNOSIS — K219 Gastro-esophageal reflux disease without esophagitis: Secondary | ICD-10-CM | POA: Diagnosis not present

## 2018-06-06 DIAGNOSIS — Z79899 Other long term (current) drug therapy: Secondary | ICD-10-CM

## 2018-06-06 DIAGNOSIS — J9811 Atelectasis: Secondary | ICD-10-CM | POA: Diagnosis not present

## 2018-06-06 DIAGNOSIS — Z85828 Personal history of other malignant neoplasm of skin: Secondary | ICD-10-CM

## 2018-06-06 DIAGNOSIS — E8770 Fluid overload, unspecified: Secondary | ICD-10-CM | POA: Diagnosis present

## 2018-06-06 DIAGNOSIS — T501X5A Adverse effect of loop [high-ceiling] diuretics, initial encounter: Secondary | ICD-10-CM | POA: Diagnosis not present

## 2018-06-06 LAB — CBC
HCT: 45.2 % (ref 39.0–52.0)
Hemoglobin: 14.7 g/dL (ref 13.0–17.0)
MCH: 28.5 pg (ref 26.0–34.0)
MCHC: 32.5 g/dL (ref 30.0–36.0)
MCV: 87.6 fL (ref 80.0–100.0)
Platelets: 260 10*3/uL (ref 150–400)
RBC: 5.16 MIL/uL (ref 4.22–5.81)
RDW: 12.2 % (ref 11.5–15.5)
WBC: 12.1 10*3/uL — ABNORMAL HIGH (ref 4.0–10.5)
nRBC: 0 % (ref 0.0–0.2)

## 2018-06-06 LAB — COMPREHENSIVE METABOLIC PANEL
ALT: 63 U/L — ABNORMAL HIGH (ref 0–44)
ANION GAP: 8 (ref 5–15)
AST: 80 U/L — ABNORMAL HIGH (ref 15–41)
Albumin: 4.2 g/dL (ref 3.5–5.0)
Alkaline Phosphatase: 95 U/L (ref 38–126)
BUN: 17 mg/dL (ref 8–23)
CO2: 28 mmol/L (ref 22–32)
Calcium: 8.8 mg/dL — ABNORMAL LOW (ref 8.9–10.3)
Chloride: 99 mmol/L (ref 98–111)
Creatinine, Ser: 1.19 mg/dL (ref 0.61–1.24)
GFR calc Af Amer: >60 mL/min (ref >60)
GFR calc non Af Amer: 56 mL/min — ABNORMAL LOW (ref >60)
Glucose, Bld: 193 mg/dL — ABNORMAL HIGH (ref 70–99)
Potassium: 3.8 mmol/L (ref 3.5–5.1)
Sodium: 135 mmol/L (ref 135–145)
TOTAL PROTEIN: 7.5 g/dL (ref 6.5–8.1)
Total Bilirubin: 1.1 mg/dL (ref 0.3–1.2)

## 2018-06-06 LAB — URINALYSIS, MICROSCOPIC (REFLEX)
Bacteria, UA: NONE SEEN
WBC, UA: NONE SEEN WBC/hpf (ref 0–5)

## 2018-06-06 LAB — URINALYSIS, ROUTINE W REFLEX MICROSCOPIC
Bilirubin Urine: NEGATIVE
Glucose, UA: NEGATIVE mg/dL
HGB URINE DIPSTICK: NEGATIVE
KETONES UR: NEGATIVE mg/dL
Leukocytes,Ua: NEGATIVE
Nitrite: NEGATIVE
Protein, ur: 30 mg/dL — AB
Specific Gravity, Urine: 1.015 (ref 1.005–1.030)
pH: 7.5 (ref 5.0–8.0)

## 2018-06-06 LAB — TRIGLYCERIDES: Triglycerides: 166 mg/dL — ABNORMAL HIGH (ref <150)

## 2018-06-06 LAB — LIPASE, BLOOD: LIPASE: 5122 U/L — AB (ref 11–51)

## 2018-06-06 LAB — LACTIC ACID, PLASMA: Lactic Acid, Venous: 1.7 mmol/L (ref 0.5–1.9)

## 2018-06-06 LAB — TROPONIN I: Troponin I: <0.03 ng/mL (ref <0.03)

## 2018-06-06 MED ORDER — PANTOPRAZOLE SODIUM 40 MG IV SOLR
40.0000 mg | INTRAVENOUS | Status: DC
  Administered 2018-06-06 – 2018-06-11 (×6): 40 mg via INTRAVENOUS
  Filled 2018-06-06 (×6): qty 40

## 2018-06-06 MED ORDER — ACETAMINOPHEN 325 MG PO TABS
650.0000 mg | ORAL_TABLET | Freq: Four times a day (QID) | ORAL | Status: DC | PRN
  Administered 2018-06-07: 650 mg via ORAL
  Filled 2018-06-06 (×2): qty 2

## 2018-06-06 MED ORDER — ONDANSETRON HCL 4 MG/2ML IJ SOLN
4.0000 mg | Freq: Once | INTRAMUSCULAR | Status: AC
  Administered 2018-06-06: 4 mg via INTRAVENOUS
  Filled 2018-06-06: qty 2

## 2018-06-06 MED ORDER — FLUTICASONE PROPIONATE 50 MCG/ACT NA SUSP
2.0000 | Freq: Every day | NASAL | Status: DC
  Administered 2018-06-06 – 2018-06-14 (×7): 2 via NASAL
  Filled 2018-06-06 (×2): qty 16

## 2018-06-06 MED ORDER — GABAPENTIN 300 MG PO CAPS
600.0000 mg | ORAL_CAPSULE | Freq: Every day | ORAL | Status: DC
  Administered 2018-06-06 – 2018-06-14 (×9): 600 mg via ORAL
  Filled 2018-06-06 (×9): qty 2

## 2018-06-06 MED ORDER — LACTATED RINGERS IV BOLUS
1000.0000 mL | Freq: Once | INTRAVENOUS | Status: AC
  Administered 2018-06-06: 1000 mL via INTRAVENOUS

## 2018-06-06 MED ORDER — ACETAMINOPHEN 650 MG RE SUPP: 650.0000 mg | Freq: Four times a day (QID) | RECTAL | Status: DC | PRN

## 2018-06-06 MED ORDER — HYDRALAZINE HCL 20 MG/ML IJ SOLN: 10.0000 mg | INTRAMUSCULAR | Status: DC | PRN

## 2018-06-06 MED ORDER — KCL IN DEXTROSE-NACL 20-5-0.9 MEQ/L-%-% IV SOLN
INTRAVENOUS | Status: DC
  Administered 2018-06-06 – 2018-06-07 (×2): via INTRAVENOUS
  Filled 2018-06-06 (×3): qty 1000

## 2018-06-06 MED ORDER — FENTANYL CITRATE (PF) 100 MCG/2ML IJ SOLN
50.0000 ug | Freq: Once | INTRAMUSCULAR | Status: AC | PRN
  Administered 2018-06-06: 50 ug via INTRAVENOUS
  Filled 2018-06-06: qty 2

## 2018-06-06 MED ORDER — TAMSULOSIN HCL 0.4 MG PO CAPS
0.4000 mg | ORAL_CAPSULE | Freq: Every day | ORAL | Status: DC
  Administered 2018-06-06 – 2018-06-14 (×9): 0.4 mg via ORAL
  Filled 2018-06-06 (×9): qty 1

## 2018-06-06 MED ORDER — FENTANYL CITRATE (PF) 100 MCG/2ML IJ SOLN
50.0000 ug | Freq: Once | INTRAMUSCULAR | Status: AC
  Administered 2018-06-06: 50 ug via INTRAVENOUS
  Filled 2018-06-06: qty 2

## 2018-06-06 MED ORDER — IOHEXOL 300 MG/ML  SOLN
100.0000 mL | Freq: Once | INTRAMUSCULAR | Status: AC | PRN
  Administered 2018-06-06: 100 mL via INTRAVENOUS

## 2018-06-06 MED ORDER — ONDANSETRON HCL 4 MG/2ML IJ SOLN
4.0000 mg | Freq: Three times a day (TID) | INTRAMUSCULAR | Status: DC | PRN
  Administered 2018-06-07 – 2018-06-10 (×5): 4 mg via INTRAVENOUS
  Filled 2018-06-06 (×6): qty 2

## 2018-06-06 MED ORDER — HYDROMORPHONE HCL 1 MG/ML IJ SOLN
INTRAMUSCULAR | Status: AC
  Filled 2018-06-06: qty 1

## 2018-06-06 MED ORDER — ORAL CARE MOUTH RINSE
15.0000 mL | Freq: Two times a day (BID) | OROMUCOSAL | Status: DC
  Administered 2018-06-06 – 2018-06-13 (×10): 15 mL via OROMUCOSAL

## 2018-06-06 MED ORDER — HYDROMORPHONE HCL 1 MG/ML IJ SOLN
1.0000 mg | Freq: Once | INTRAMUSCULAR | Status: AC
  Administered 2018-06-06: 1 mg via INTRAVENOUS

## 2018-06-06 MED ORDER — HYDROMORPHONE HCL 1 MG/ML IJ SOLN
0.5000 mg | INTRAMUSCULAR | Status: DC | PRN
  Administered 2018-06-07 (×2): 0.5 mg via INTRAVENOUS
  Filled 2018-06-06 (×2): qty 1

## 2018-06-06 MED ORDER — ENOXAPARIN SODIUM 40 MG/0.4ML ~~LOC~~ SOLN
40.0000 mg | SUBCUTANEOUS | Status: DC
  Administered 2018-06-06 – 2018-06-07 (×2): 40 mg via SUBCUTANEOUS
  Filled 2018-06-06 (×2): qty 0.4

## 2018-06-06 MED ORDER — TRIAMCINOLONE ACETONIDE 0.1 % EX CREA: TOPICAL_CREAM | Freq: Two times a day (BID) | CUTANEOUS | Status: DC | PRN

## 2018-06-06 MED ORDER — SODIUM CHLORIDE 0.9 % IV BOLUS
500.0000 mL | Freq: Once | INTRAVENOUS | Status: AC
  Administered 2018-06-06: 500 mL via INTRAVENOUS

## 2018-06-06 NOTE — ED Notes (Signed)
Patient transported to CT 

## 2018-06-06 NOTE — ED Notes (Signed)
Report given to Shannon RN with [facility] 

## 2018-06-06 NOTE — ED Provider Notes (Signed)
[facility] Emergency Department Provider Note MRN:  [id number]  Arrival date & time: 06/06/18     Chief Complaint   Abdominal Pain   History of Present Illness   Timothy Khan is a 85 y.o. year-old male with a history of BPH, hypertension, hyperlipidemia, GERD presenting to the ED with chief complaint of abdominal pain.  Patient endorsing sudden onset severe epigastric pain at 3 AM.  Described as the worst pain of his life.  Pain has since become diffuse throughout the abdomen.  Endorsing 2-3 episodes of nonbloody nonbilious emesis.  Denies fever, no chest pain or shortness of breath, no dysuria, no diarrhea.  Pain is constant, no exacerbating relieving factors.  Review of Systems  A complete 10 system review of systems was obtained and all systems are negative except as noted in the HPI and PMH.   Patient's Health History    Past Medical History:  Diagnosis Date  . Abnormal brain MRI    Right side  . Basal cell carcinoma of back   . BPH (benign prostatic hyperplasia)   . Concussion 1950   Baseball injury  . Foot drop, bilateral 05/02/2013  . Gait disturbance   . Gastroesophageal reflux disease   . Hyperlipidemia   . Hypertension   . Lumbago   . Peripheral neuropathy   . Skull fracture [facility])     Past Surgical History:  Procedure Laterality Date  . CATARACT EXTRACTION Bilateral   . KNEE SURGERY Left   . skin cancer resection     basal cell, [place] carcinoma  . TONSILLECTOMY    . VASECTOMY      Family History  Problem Relation Age of Onset  . Cancer Mother        Breast cancer  . Heart attack Father   . Heart disease Brother     Social History   Socioeconomic History  . Marital status: Married    Spouse name: Not on file  . Number of children: 4  . Years of education: 42  . Highest education level: Not on file  Occupational History  . Occupation: Retired  Scientific laboratory technician  . Financial resource strain: Not on file  . Food  insecurity:    Worry: Not on file    Inability: Not on file  . Transportation needs:    Medical: Not on file    Non-medical: Not on file  Tobacco Use  . Smoking status: Former Smoker    Last attempt to quit: 08/18/1984    Years since quitting: 33.8  . Smokeless tobacco: Never Used  Substance and Sexual Activity  . Alcohol use: No  . Drug use: No  . Sexual activity: Not on file  Lifestyle  . Physical activity:    Days per week: Not on file    Minutes per session: Not on file  . Stress: Not on file  Relationships  . Social connections:    Talks on phone: Not on file    Gets together: Not on file    Attends religious service: Not on file    Active member of club or organization: Not on file    Attends meetings of clubs or organizations: Not on file    Relationship status: Not on file  . Intimate partner violence:    Fear of current or ex partner: Not on file    Emotionally abused: Not on file    Physically abused: Not on file    Forced sexual activity: Not  on file  Other Topics Concern  . Not on file  Social History Narrative   Patient states he is ambidextrous but is predominantly left handed.   Patient drinks 3 cups of coffee per day.     Physical Exam  Vital Signs and Nursing Notes reviewed Vitals:   06/06/18 1012 06/06/18 1045  BP:  (!) 176/79  Pulse: 88 96  Resp: 18 16  Temp:    SpO2: 98% 100%    CONSTITUTIONAL: Well-appearing, NAD NEURO:  Alert and oriented x 3, no focal deficits EYES:  eyes equal and reactive ENT/NECK:  no LAD, no JVD CARDIO: Regular rate, well-perfused, normal S1 and S2 PULM:  CTAB no wheezing or rhonchi GI/GU:  normal bowel sounds, non-distended, moderate diffuse tenderness MSK/SPINE:  No gross deformities, no edema SKIN:  no rash, atraumatic PSYCH:  Appropriate speech and behavior  Diagnostic and Interventional Summary    EKG Interpretation  Date/Time:  Thursday June 06 2018 09:09:41 EDT Ventricular Rate:  93 PR Interval:     QRS Duration: 140 QT Interval:  413 QTC Calculation: 514 R Axis:   -26 Text Interpretation:  Sinus rhythm Prolonged PR interval Consider right atrial enlargement Left bundle branch block Baseline wander in lead(s) V3 Confirmed by Gerlene Fee [phone]) on 06/06/2018 9:15:19 AM      Labs Reviewed  CBC - Abnormal; Notable for the following components:      Result Value   WBC 12.1 (*)    All other components within normal limits  COMPREHENSIVE METABOLIC PANEL - Abnormal; Notable for the following components:   Glucose, Bld 193 (*)    Calcium 8.8 (*)    AST 80 (*)    ALT 63 (*)    GFR calc non Af Amer 56 (*)    All other components within normal limits  LIPASE, BLOOD - Abnormal; Notable for the following components:   Lipase 5,122 (*)    All other components within normal limits  URINALYSIS, ROUTINE W REFLEX MICROSCOPIC - Abnormal; Notable for the following components:   Protein, ur 30 (*)    All other components within normal limits  LACTIC ACID, PLASMA  TROPONIN I  URINALYSIS, MICROSCOPIC (REFLEX)    CT ABDOMEN PELVIS W CONTRAST  Final Result      Medications  lactated ringers bolus 1,000 mL (has no administration in time range)  fentaNYL (SUBLIMAZE) injection 50 mcg (50 mcg Intravenous Given 06/06/18 0915)  sodium chloride 0.9 % bolus 500 mL (0 mLs Intravenous Stopped 06/06/18 1015)  ondansetron (ZOFRAN) injection 4 mg (4 mg Intravenous Given 06/06/18 0913)  lactated ringers bolus 1,000 mL (1,000 mLs Intravenous New Bag/Given 06/06/18 1006)  fentaNYL (SUBLIMAZE) injection 50 mcg (50 mcg Intravenous Given 06/06/18 1010)  iohexol (OMNIPAQUE) 300 MG/ML solution 100 mL (100 mLs Intravenous Contrast Given 06/06/18 1017)     Procedures Critical Care  ED Course and Medical Decision Making  I have reviewed the triage vital signs and the nursing notes.  Pertinent labs & imaging results that were available during my care of the patient were reviewed by me and considered in my medical  decision making (see below for details).  Concern for possible perforated viscus in this 85 year old male with history of GERD here with severe epigastric pain that is become more diffuse with vomiting.  Patient reports history of CKD, will evaluate kidney function today to determine ability to image with IV contrast.  Patient's kidney function is amenable to IV contrast study.  Lipase comes back at  greater than 5000, CT abdomen reveals uncomplicated pancreatitis, gallbladder is relatively large but no objective evidence of cholecystitis.  Provided with 2.5 L crystalloid, fentanyl IV as needed for pain, admitted to hospital service at [facility] for further care.  Barth Kirks. Sedonia Small, MD [place] [email or web address]  Final Clinical Impressions(s) / ED Diagnoses     ICD-10-CM   1. Acute pancreatitis, unspecified complication status, unspecified pancreatitis type K85.90     ED Discharge Orders    None         Maudie Flakes, MD 06/06/18 1135

## 2018-06-06 NOTE — Progress Notes (Signed)
consult for admit to [facility]: referring physician : Dr Sedonia Small   85 y/o male with HTN, GERD, peripheral neuropathy and NO alcohol use presented to [facility] with severe epigastric pain, nausea, 2 episodes of vomiting and now diffuse abdominal pain since early this am. No fever, bodyaches or URI symptoms.  lipase of 5000, mild transaminases (80 and 60).Vitals stable except elevated BP. lactate and cbc normal. CT abd and pelvis shows acute uncomplicated pancreatitis, circumferential thickness of distal esophagus.( given hx of GERD may warrant GI eval). Stable after receiving 2 L RL and 1L NS bolus ,IV fentanyl and zofran in ED.  Accepted to med surg bed at [facility].

## 2018-06-06 NOTE — ED Notes (Signed)
Family at bedside. 

## 2018-06-06 NOTE — ED Notes (Signed)
Ambulatory to [facility], using cane, gait steady. Son at bedside, awaiting [facility] for transport

## 2018-06-06 NOTE — ED Notes (Signed)
Instructed on needing u/a, urinal at bedside, as well as son

## 2018-06-06 NOTE — H&P (Signed)
History and Physical   Timothy Khan EXN:[id number] DOB: Aug 13, 1931 DOA: 06/06/2018  Referring MD/NP/PA: Dr. Sedonia Small, [place] PCP: [name], MD Outpatient Specialists: Colonoscopies in the past by [facility] GI  Patient coming from: Home by way of [facility]  Chief Complaint: Abdominal pain  HPI: Timothy Khan is a 85 y.o. male with a history of GERD, HTN, peripheral neuropathy with bilateral foot drop, falls and L1 compression fracture who presented to the ED 3/19 with severe epigastric abdominal pain, nausea and vomiting. He woke up with abrupt 10/10 upper abdominal pain this morning radiating throughout abdomen that remained constant and associated with 2 episodes of NBNB vomiting and dry heaves thereafter prompting presentation. He does not drink alcohol, has never had pain like this before, does not have personal history of autoimmune diseases, and has not changed doses, started or stopped any medications. No sick exposures or URI symptoms, no fever.   ED Course: Found to be afebrile, hypertensive. Labs revealed lipase of 5,122, AST 80, ALT 63, Alk phos and bilirubin normal. WBC 12.1, lactic acid 1.7. CT of the abdomen/pelvis demonstrates inflammatory changes in the pancreas without organized fluid collection and no hypoperfusive contrast uptake to suggest necrosis. Pancreatic duct appears normal and no masses are noted. There was concern for distal esophageal thickening as well. The patient was given 2L LR and 500cc NS, IV dilaudid, zofran, and admitted to [facility].  Review of Systems: No fever, chills, night sweats, unintentional weight loss. +dysphagia, no odynophagia, intermittent globus sensation, no rumination or preceding abdominal pain. No chest pain, dyspnea, orthopnea, and per HPI. All others reviewed and are negative.   Past Medical History:  Diagnosis Date   Abnormal brain MRI    Right side   Basal cell carcinoma of back    BPH (benign prostatic hyperplasia)    Concussion 1950    Baseball injury   Foot drop, bilateral 05/02/2013   Gait disturbance    Gastroesophageal reflux disease    Hyperlipidemia    Hypertension    Lumbago    Peripheral neuropathy    Skull fracture (HCC)    Past Surgical History:  Procedure Laterality Date   CATARACT EXTRACTION Bilateral    KNEE SURGERY Left    skin cancer resection     basal cell, [place] carcinoma   TONSILLECTOMY     VASECTOMY     - Remote smoker, does not drink alcohol  reports that he quit smoking about 33 years ago. He has never used smokeless tobacco. He reports that he does not drink alcohol or use drugs. Allergies  Allergen Reactions   Codeine Nausea Only   Family History  Problem Relation Age of Onset   Cancer Mother        Breast cancer   Heart attack Father    Heart disease Brother    - Family history otherwise reviewed and not pertinent.  Prior to Admission medications   Medication Sig Start Date End Date Taking? Authorizing Provider  cetirizine (ZYRTEC) 10 MG tablet Take 10 mg by mouth daily.   Yes [provider]  Cholecalciferol (VITAMIN D3) 2000 UNITS capsule Take 2,000 Units by mouth every other day.    Yes [provider]  Coenzyme Q10 (CO Q 10) 10 MG CAPS Take 1 tablet by mouth every morning.   Yes [provider]  Esomeprazole Magnesium (NEXIUM PO) Take 22.3 mg by mouth daily.   Yes [provider]  fluticasone (FLONASE) 50 MCG/ACT nasal spray Place 2 sprays into  both nostrils at bedtime.  04/16/13  Yes [provider]  gabapentin (NEURONTIN) 300 MG capsule Take 2 capsules (600 mg total) by mouth at bedtime. 12/04/17  Yes Kathrynn Ducking, MD  hydrochlorothiazide (MICROZIDE) 12.5 MG capsule Take 12.5 mg by mouth daily.  05/22/17  Yes [provider]  losartan (COZAAR) 50 MG tablet Take 50 mg by mouth daily.  05/22/17  Yes [provider]  Multiple Vitamin (MULTIVITAMIN) capsule Take 1 capsule by mouth daily.   Yes  [provider]  psyllium (METAMUCIL) 58.6 % packet Take 1 packet by mouth daily.   Yes [provider]  tamsulosin (FLOMAX) 0.4 MG CAPS Take 0.4 mg by mouth at bedtime.    Yes [provider]  traMADol (ULTRAM) 50 MG tablet Take 100 mg by mouth daily.  10/01/12  Yes [provider]  triamcinolone cream (KENALOG) 0.1 %  05/10/17  Yes [provider]    Physical Exam: Vitals:   06/06/18 1245 06/06/18 1330 06/06/18 1415 06/06/18 1521  BP: (!) 182/91 (!) 147/88 (!) 162/83 (!) 146/91  Pulse: (!) 106 100 (!) 104 (!) 107  Resp:    16  Temp:    97.7 F (36.5 C)  TempSrc:    Oral  SpO2: 96% 92% 91% 95%   Constitutional: Well-appearing elderly male in no distress, calm demeanor Eyes: Lids and conjunctivae normal, PERRL ENMT: Mucous membranes are moist. Posterior pharynx clear of any exudate or lesions. Fair dentition.  Neck: normal, supple, no masses, no thyromegaly Respiratory: Non-labored breathing room air without accessory muscle use. Clear breath sounds to auscultation bilaterally Cardiovascular: Regular rate and rhythm, no murmurs, rubs, or gallops. No carotid bruits. No JVD. trace pitting LE edema. 2+ pedal pulses. Abdomen: Normoactive bowel sounds. Mild epigastric tenderness, firm but not distended, no rebound, and no masses palpated. No hepatosplenomegaly. GU: No indwelling catheter Musculoskeletal: No clubbing / cyanosis. No joint deformity. Normal muscle bulk and tone.  Skin: Warm, dry. No rashes, wounds, or ulcers. No significant lesions noted.  Neurologic: CN II-XII grossly intact. Speech normal. bilateral weakness with dorsiflexion of hallux and ankles. Otherwise, no focal deficits in motor strength or sensation in all extremities.  Psychiatric: Alert and oriented x3. Normal judgment and insight. Mood euthymic with pleasant, broad affect.   Labs on Admission: I have personally reviewed following labs and imaging studies  CBC: Recent  Labs  Lab 06/06/18 0905  WBC 12.1*  HGB 14.7  HCT 45.2  MCV 87.6  PLT 834   Basic Metabolic Panel: Recent Labs  Lab 06/06/18 0905  NA 135  K 3.8  CL 99  CO2 28  GLUCOSE 193*  BUN 17  CREATININE 1.19  CALCIUM 8.8*   GFR: CrCl cannot be calculated (Unknown ideal weight.). Liver Function Tests: Recent Labs  Lab 06/06/18 0905  AST 80*  ALT 63*  ALKPHOS 95  BILITOT 1.1  PROT 7.5  ALBUMIN 4.2   Recent Labs  Lab 06/06/18 0905  LIPASE 5,122*   No results for input(s): AMMONIA in the last 168 hours. Coagulation Profile: No results for input(s): INR, PROTIME in the last 168 hours. Cardiac Enzymes: Recent Labs  Lab 06/06/18 0905  TROPONINI <0.03   BNP (last 3 results) No results for input(s): PROBNP in the last 8760 hours. HbA1C: No results for input(s): HGBA1C in the last 72 hours. CBG: No results for input(s): GLUCAP in the last 168 hours. Lipid Profile: No results for input(s): CHOL, HDL, LDLCALC, TRIG, CHOLHDL, LDLDIRECT in  the last 72 hours. Thyroid Function Tests: No results for input(s): TSH, T4TOTAL, FREET4, T3FREE, THYROIDAB in the last 72 hours. Anemia Panel: No results for input(s): VITAMINB12, FOLATE, FERRITIN, TIBC, IRON, RETICCTPCT in the last 72 hours. Urine analysis:    Component Value Date/Time   COLORURINE YELLOW 06/06/2018 1015   APPEARANCEUR CLEAR 06/06/2018 1015   LABSPEC 1.015 06/06/2018 1015   PHURINE 7.5 06/06/2018 1015   GLUCOSEU NEGATIVE 06/06/2018 1015   HGBUR NEGATIVE 06/06/2018 1015   [place] NEGATIVE 06/06/2018 1015   [place] 06/06/2018 1015   PROTEINUR 30 (A) 06/06/2018 1015   NITRITE NEGATIVE 06/06/2018 1015   LEUKOCYTESUR NEGATIVE 06/06/2018 1015    No results found for this or any previous visit (from the past 240 hour(s)).   Radiological Exams on Admission: Ct Abdomen Pelvis W Contrast  Result Date: 06/06/2018 CLINICAL DATA:  85 year old male with a history abdominal pain nausea and vomiting EXAM:  CT ABDOMEN AND PELVIS WITH CONTRAST TECHNIQUE: Multidetector CT imaging of the abdomen and pelvis was performed using the standard protocol following bolus administration of intravenous contrast. CONTRAST:  188m OMNIPAQUE IOHEXOL 300 MG/ML  SOLN COMPARISON:  Chest x-ray 03/19/2018, MRI 04/14/2011 FINDINGS: Lower chest: Atelectatic changes at the lung bases. Circumferential thickening of the distal esophagus above a hiatal hernia. Hepatobiliary: Low-density lesions of left and right liver with well-defined margins and no internal enhancement, all of which measure fluid density. Largest in the left measures 4.4 cm transverse diameter. No radiopaque stones within the gallbladder. There is no ductal dilatation of the extrahepatic or intrahepatic biliary ducts. No pericholecystic fluid. Pancreas: Inflammatory changes of the fat surrounding the pancreas, with trace fluid within the retroperitoneal fascial planes. The enhancement/attenuation of liver parenchyma is maintained, with no focal hypoperfusion/necrosis. Unremarkable appearance of the pancreatic duct. Spleen: Unremarkable spleen Adrenals/Urinary Tract: Unremarkable adrenal glands. No evidence of hydronephrosis or nephrolithiasis. Unremarkable course the bilateral ureters. Partial distention of urinary bladder. Impression on the bladder base by the prostate. Stomach/Bowel: Unremarkable stomach. Unremarkable small bowel. No abnormal distention. No transition point. No focal wall thickening. Normal appendix. Mild stool burden. Diverticular change of the sigmoid colon. No acute inflammation. Vascular/Lymphatic: Minimal atherosclerotic calcifications of the abdominal aorta. Calcifications at the mesenteric artery origins, with calcified plaque at the celiac artery origin and soft plaque at the SMA origin. IMA is patent. Bilateral renal arteries patent. Reproductive: Transverse diameter of prostate measures 5.2 cm. Other: No hernia. Musculoskeletal: New compression  fracture of L1 with 50% height loss anteriorly. The posterosuperior endplate demonstrates mild retropulsion without significant bony canal narrowing. Vacuum disc phenomenon. This compression fracture was not present on the MRI of 04/14/2011, and does not appear to have been present on the plain film chest x-ray of 03/19/2018. Degenerative changes of the hips. Degenerative changes of the lumbar spine. IMPRESSION: Acute pancreatitis, uncomplicated. The above results were discussed by telephone at the time of interpretation on 06/06/2018 at 10:52 am with Dr. MGerlene Fee Circumferential thickening of the distal esophagus. Recommend correlation with a history of GERD, and referral for GI evaluation may be considered for potential upper endoscopy, as early malignancy can not be excluded. Compression fracture of L1 which is new from plain film dated 03/19/2018. Approximately 50% height loss. Diverticular change without evidence of acute diverticulitis. Low-density lesions of the liver which are most likely benign biliary cysts. Aortic Atherosclerosis (ICD10-I70.0). Electronically Signed   By: JCorrie MckusickD.O.   On: 06/06/2018 10:53    EKG: Independently reviewed. NSR with LBBB which has  developed since last tracing in Feb 2015  Assessment/Plan Principal Problem:   Acute pancreatitis Active Problems:   Hereditary and idiopathic peripheral neuropathy   Hypertension   Foot drop, bilateral   BPH (benign prostatic hyperplasia)   LBBB (left bundle branch block)   GERD (gastroesophageal reflux disease)   Esophageal thickening   Hiatal hernia   Acute idiopathic pancreatitis: No obstructive LFT pattern, pancreatic duct appears patent, though new onset pancreatitis in 86yo is concerning.  - NPO, IVF's. Will advance diet as tolerated but not before GI evaluation. - GI consulted for further recommendations. I've asked them to see the patient tomorrow morning.  - Check IgG's, ANA, and triglycerides - Stop  HCTZ  HTN:  - Hold HCTZ, losartan for now - IV hydralazine prn  GERD, hiatal hernia, distal esophageal thickening: Pt denies hx EGD. No constitutional symptoms of malignancy, though is having intermittent globus/dysphagia.  - GI consulted - Continue PPI through IV for now  LBBB: Uncertain chronicity (hasn't had ECG in >5 years), though not having chest pain and troponin negative, do not have high suspicion for ACS. - Check echocardiogram  Idiopathic peripheral neuropathy: Under care of neurology, Dr. Jannifer Franklin, as outpatient.  - Continue gabapentin. Undergoing PT as outpatient.   L1 compression fracture: Not new - Hold home tramadol while giving dilaudid.   DVT prophylaxis: Lovenox  Code Status: Full, confirmed on admission   Family Communication: None at bedside Disposition Plan: Likely home once clinically stabilized  Consults called: [facility], spoke with Dr. Therisa Doyne Admission status: Inpatient  The appropriate admission status for this patient is INPATIENT. Inpatient status is judged to be reasonable and necessary in order to provide the required intensity of service to ensure the patient's safety. The patient's presenting symptoms, physical exam findings, and initial radiographic and laboratory data in the context of their chronic comorbidities is felt to place them at high risk for further clinical deterioration. Furthermore, it is not anticipated that the patient will be medically stable for discharge from the hospital within 2 midnights of admission. The following factors support the admission status of inpatient.    The patient's presenting symptoms include Epigastric pain, nausea, vomiting, dysphagia.  The worrisome physical exam findings include abdominal tenderness.  The initial radiographic and laboratory data are worrisome because of lipase elevation, LFT abnormalities, pancreatic inflammation, esophageal thickening, LBBB.  The chronic co-morbidities include HTN, advanced  age, GERD, neuropathy with foot drop.  Patient requires inpatient status due to high intensity of service, high risk for further deterioration and high frequency of surveillance required.  I certify that at the point of admission it is my clinical judgment that the patient will require inpatient hospital care spanning beyond 2 midnights from the point of admission.      Patrecia Pour, MD [facility] [email or web address] Password [facility] 06/06/2018, 4:51 PM

## 2018-06-06 NOTE — ED Notes (Signed)
Report given to [place], receiving nurse at [facility]

## 2018-06-06 NOTE — ED Triage Notes (Signed)
Has been having "stomach swelling" for month, PCP aware. Woke up this am, 0330 with sharp abd pain 2, no diarrhea.

## 2018-06-06 NOTE — ED Notes (Signed)
ED Provider at bedside. 

## 2018-06-07 ENCOUNTER — Encounter: Payer: Self-pay | Admitting: Physical Therapy

## 2018-06-07 ENCOUNTER — Inpatient Hospital Stay (HOSPITAL_COMMUNITY): Payer: Medicare Other

## 2018-06-07 DIAGNOSIS — R9431 Abnormal electrocardiogram [ECG] [EKG]: Secondary | ICD-10-CM

## 2018-06-07 LAB — PROTIME-INR
INR: 1.2 (ref 0.8–1.2)
Prothrombin Time: 15.3 seconds — ABNORMAL HIGH (ref 11.4–15.2)

## 2018-06-07 LAB — IGG 4: IgG, Subclass 4: 17 mg/dL (ref 2–96)

## 2018-06-07 LAB — CBC WITH DIFFERENTIAL/PLATELET
Abs Immature Granulocytes: 0.28 10*3/uL — ABNORMAL HIGH (ref 0.00–0.07)
Basophils Absolute: 0 10*3/uL (ref 0.0–0.1)
Basophils Relative: 0 %
EOS ABS: 0.1 10*3/uL (ref 0.0–0.5)
Eosinophils Relative: 0 %
HCT: 47.3 % (ref 39.0–52.0)
Hemoglobin: 14.8 g/dL (ref 13.0–17.0)
Immature Granulocytes: 1 %
Lymphocytes Relative: 4 %
Lymphs Abs: 1 10*3/uL (ref 0.7–4.0)
MCH: 28.9 pg (ref 26.0–34.0)
MCHC: 31.3 g/dL (ref 30.0–36.0)
MCV: 92.4 fL (ref 80.0–100.0)
Monocytes Absolute: 1.1 10*3/uL — ABNORMAL HIGH (ref 0.1–1.0)
Monocytes Relative: 4 %
NRBC: 0 % (ref 0.0–0.2)
Neutro Abs: 25.9 10*3/uL — ABNORMAL HIGH (ref 1.7–7.7)
Neutrophils Relative %: 91 %
PLATELETS: 231 10*3/uL (ref 150–400)
RBC: 5.12 MIL/uL (ref 4.22–5.81)
RDW: 13.2 % (ref 11.5–15.5)
WBC: 28.3 10*3/uL — AB (ref 4.0–10.5)

## 2018-06-07 LAB — COMPREHENSIVE METABOLIC PANEL
ALK PHOS: 64 U/L (ref 38–126)
ALT: 84 U/L — ABNORMAL HIGH (ref 0–44)
ALT: 65 U/L — ABNORMAL HIGH (ref 0–44)
AST: 73 U/L — ABNORMAL HIGH (ref 15–41)
AST: 47 U/L — ABNORMAL HIGH (ref 15–41)
Albumin: 3.3 g/dL — ABNORMAL LOW (ref 3.5–5.0)
Albumin: 3.2 g/dL — ABNORMAL LOW (ref 3.5–5.0)
Alkaline Phosphatase: 70 U/L (ref 38–126)
Anion gap: 9 (ref 5–15)
Anion gap: 9 (ref 5–15)
BILIRUBIN TOTAL: 1.3 mg/dL — AB (ref 0.3–1.2)
BUN: 18 mg/dL (ref 8–23)
BUN: 18 mg/dL (ref 8–23)
CO2: 23 mmol/L (ref 22–32)
CO2: 23 mmol/L (ref 22–32)
Calcium: 7.8 mg/dL — ABNORMAL LOW (ref 8.9–10.3)
Calcium: 7.3 mg/dL — ABNORMAL LOW (ref 8.9–10.3)
Chloride: 105 mmol/L (ref 98–111)
Chloride: 104 mmol/L (ref 98–111)
Creatinine, Ser: 1.27 mg/dL — ABNORMAL HIGH (ref 0.61–1.24)
Creatinine, Ser: 1.26 mg/dL — ABNORMAL HIGH (ref 0.61–1.24)
GFR calc Af Amer: 60 mL/min — ABNORMAL LOW (ref >60)
GFR calc Af Amer: >60 mL/min (ref >60)
GFR calc non Af Amer: 52 mL/min — ABNORMAL LOW (ref >60)
GFR, EST NON AFRICAN AMERICAN: 52 mL/min — AB (ref >60)
Glucose, Bld: 149 mg/dL — ABNORMAL HIGH (ref 70–99)
Glucose, Bld: 142 mg/dL — ABNORMAL HIGH (ref 70–99)
POTASSIUM: 4.8 mmol/L (ref 3.5–5.1)
Potassium: 4.1 mmol/L (ref 3.5–5.1)
SODIUM: 136 mmol/L (ref 135–145)
Sodium: 137 mmol/L (ref 135–145)
TOTAL PROTEIN: 6.3 g/dL — AB (ref 6.5–8.1)
Total Bilirubin: 1 mg/dL (ref 0.3–1.2)
Total Protein: 6.3 g/dL — ABNORMAL LOW (ref 6.5–8.1)

## 2018-06-07 LAB — GLUCOSE, CAPILLARY: Glucose-Capillary: 156 mg/dL — ABNORMAL HIGH (ref 70–99)

## 2018-06-07 LAB — CBC
HCT: 48 % (ref 39.0–52.0)
Hemoglobin: 15.3 g/dL (ref 13.0–17.0)
MCH: 28.9 pg (ref 26.0–34.0)
MCHC: 31.9 g/dL (ref 30.0–36.0)
MCV: 90.6 fL (ref 80.0–100.0)
Platelets: 280 10*3/uL (ref 150–400)
RBC: 5.3 MIL/uL (ref 4.22–5.81)
RDW: 13 % (ref 11.5–15.5)
WBC: 28.2 10*3/uL — AB (ref 4.0–10.5)
nRBC: 0 % (ref 0.0–0.2)

## 2018-06-07 LAB — LACTIC ACID, PLASMA
Lactic Acid, Venous: 2.1 mmol/L (ref 0.5–1.9)
Lactic Acid, Venous: 2.4 mmol/L (ref 0.5–1.9)
Lactic Acid, Venous: 2.3 mmol/L (ref 0.5–1.9)

## 2018-06-07 LAB — APTT: aPTT: 30 seconds (ref 24–36)

## 2018-06-07 LAB — IGG: IgG (Immunoglobin G), Serum: 1276 mg/dL (ref 700–1600)

## 2018-06-07 LAB — PROCALCITONIN: Procalcitonin: 0.35 ng/mL

## 2018-06-07 LAB — ANA: Anti Nuclear Antibody (ANA): NEGATIVE

## 2018-06-07 MED ORDER — SODIUM CHLORIDE 0.9 % IV SOLN
2.0000 g | Freq: Once | INTRAVENOUS | Status: AC
  Administered 2018-06-07: 2 g via INTRAVENOUS
  Filled 2018-06-07: qty 2

## 2018-06-07 MED ORDER — METRONIDAZOLE IN NACL 5-0.79 MG/ML-% IV SOLN
500.0000 mg | Freq: Three times a day (TID) | INTRAVENOUS | Status: DC
  Administered 2018-06-07 – 2018-06-12 (×14): 500 mg via INTRAVENOUS
  Filled 2018-06-07 (×14): qty 100

## 2018-06-07 MED ORDER — OXYCODONE HCL 5 MG PO TABS
5.0000 mg | ORAL_TABLET | ORAL | Status: DC | PRN
  Administered 2018-06-07 – 2018-06-08 (×3): 5 mg via ORAL
  Administered 2018-06-09 – 2018-06-11 (×5): 10 mg via ORAL
  Administered 2018-06-12 (×2): 5 mg via ORAL
  Filled 2018-06-07 (×2): qty 1
  Filled 2018-06-07 (×2): qty 2
  Filled 2018-06-07 (×2): qty 1
  Filled 2018-06-07 (×3): qty 2
  Filled 2018-06-07 (×2): qty 1

## 2018-06-07 MED ORDER — HYDROMORPHONE HCL 1 MG/ML IJ SOLN
1.0000 mg | INTRAMUSCULAR | Status: DC | PRN
  Administered 2018-06-07 – 2018-06-08 (×2): 1 mg via INTRAVENOUS
  Filled 2018-06-07 (×2): qty 1

## 2018-06-07 MED ORDER — SODIUM CHLORIDE 0.9 % IV SOLN
2.0000 g | Freq: Two times a day (BID) | INTRAVENOUS | Status: DC
  Administered 2018-06-08 – 2018-06-12 (×9): 2 g via INTRAVENOUS
  Filled 2018-06-07 (×9): qty 2

## 2018-06-07 MED ORDER — SODIUM CHLORIDE 0.9 % IV BOLUS (SEPSIS)
500.0000 mL | Freq: Once | INTRAVENOUS | Status: AC
  Administered 2018-06-07: 500 mL via INTRAVENOUS

## 2018-06-07 MED ORDER — SODIUM CHLORIDE 0.9 % IV SOLN
INTRAVENOUS | Status: AC
  Administered 2018-06-07 – 2018-06-08 (×5): via INTRAVENOUS

## 2018-06-07 MED ORDER — SODIUM CHLORIDE 0.9 % IV BOLUS (SEPSIS)
1000.0000 mL | Freq: Once | INTRAVENOUS | Status: AC
  Administered 2018-06-07: 1000 mL via INTRAVENOUS

## 2018-06-07 NOTE — Progress Notes (Addendum)
PROGRESS NOTE  Timothy Khan XTG:[id number] DOB: Apr 17, 1931 DOA: 06/06/2018 PCP: [name], MD  Brief History   The patient is a 85 yr old man who carries a past medical history significant for BPH, Bilateral foot drop, gait disturbance, GERD, hyperlipdemia, hypertension, peripheral neuropathy, and history of cuncussion, skull fracture and abnormal MRI brain.  The patient presented to [facility] on 06/06/2018 with a complaint of severe epigastric abdominal pain, nausea and vomiting early that morning. He stated that the pain was sharp. 10/10 in severity. He states that he had multiple episodes of emesis and then dry heaves. He denies constipation of diarrhea. He denies fevers or chills. He has had no sick contact. He has had no cough, shortness of breath, or wheezes, He does not drink alcohol. He has never had anything like this in the past.  In the ED the patient was febrile, with a high blood pressure. His lipase was 5122 with a mildly elevated ALT. Remainder of LFT's were unremarkable. CT abdomen and pelvis revealed inflammatory changes in the peripancreatic fat. There are organized fluid collections surrounding the pancreas as well. This is no biliary ductal dilatation. No stones seen in gallbladder. It also demonstrates circumferential thicking of the distal esophagus. And an old compression fracture of L1.  GI was consulted and the patient underwent a right upper quadrant ultrasound that demonstrated non  Evidence of cholelithiasis.  Consultants  . Gastroenterology  Procedures  . None  Antibiotics  . Zosyn  Interval History/Subjective  The patient is a 85 yr old man who carries a past medical history significant for BPH, Bilateral foot drop, gait disturbance, GERD, hyperlipdemia, hypertension, peripheral neuropathy, and history of cuncussion, skull fracture and abnormal MRI brain.  The patient presented to [facility] on 06/06/2018 with a complaint of severe epigastric abdominal pain,  nausea and vomiting early that morning. He stated that the pain was sharp. 10/10 in severity. He states that he had multiple episodes of emesis and then dry heaves. He denies constipation of diarrhea. He denies fevers or chills. He has had no sick contact. He has had no cough, shortness of breath, or wheezes, He does not drink alcohol. He has never had anything like this in the past.  In the ED the patient was febrile, with a high blood pressure. His lipase was 5122 with a mildly elevated ALT. Remainder of LFT's were unremarkable. CT abdomen and pelvis revealed inflammatory changes in the peripancreatic fat. There are organized fluid collections surrounding the pancreas as well. This is no biliary ductal dilatation. No stones seen in gallbladder. It also demonstrates circumferential thicking of the distal esophagus. And an old compression fracture of L1.  GI was consulted and the patient underwent a right upper quadrant ultrasound that demonstrated non  Evidence of cholelithiasis. HIDA scan is pending  The patient is awake, alert, and oriented x 3. He is complaining of abdominal pain. No further emesis.   Objective   Vitals:  Vitals:   06/07/18 0532 06/07/18 1348  BP: 130/72 (!) 155/99  Pulse: (!) 112 (!) 114  Resp: 18 16  Temp: 98.4 F (36.9 C) 99.7 F (37.6 C)  SpO2: 93% 93%    Exam:  Constitutional:  . The patient is awake, alert, and oriented x 3. Mild distress from abdominal pain. Respiratory:  . No increased work of breathing.  . No wheezes, rales, or rhonchi. . No tactile fremitus. Cardiovascular:  . Regular rate and rhythm. No murmurs, ectopy or gallups. . No LE extremity edema   .  Normal pedal pulses Abdomen:  . Abdomen is mildly distended. It is tender especially in the epigastrum and less so diffusely. . No hernias, masses, or organomegaly are appreciated. . Normoactive bowel sounds. Musculoskeletal:  . No cyanosis, clubbing or edema Skin:  . No rashes, lesions,  ulcers . palpation of skin: no induration or nodules . Warm, dry, and intact Neurologic:  . CN 2-12 intact . Patient is moving all extremities. Psychiatric:  . Mental status o Mood, affect appropriate o Orientation to person, place, time  . judgment and insight appear intact     I have personally reviewed the following:   Today's Data  . CBC, CMP, Vitals,   Imaging  . Right upper quadrant ultrasound, CT abdomen   Scheduled Meds: . enoxaparin (LOVENOX) injection  40 mg Subcutaneous Q24H  . fluticasone  2 spray Each Nare QHS  . gabapentin  600 mg Oral QHS  . mouth rinse  15 mL Mouth Rinse BID  . pantoprazole (PROTONIX) IV  40 mg Intravenous Q24H  . tamsulosin  0.4 mg Oral QHS   Continuous Infusions: . sodium chloride 250 mL/hr at 06/07/18 1303    Principal Problem:   Acute pancreatitis Active Problems:   Hereditary and idiopathic peripheral neuropathy   Hypertension   Foot drop, bilateral   BPH (benign prostatic hyperplasia)   LBBB (left bundle branch block)   GERD (gastroesophageal reflux disease)   Esophageal thickening   Hiatal hernia   A & P  Acute pancreatitis: Pain control, IV fluids, anti-emetics. Patient was taking HCTZ at home. Perhaps is a cause. He is not a drinker. TGL is 166. WBC up from 12.1 to 28.2. Will recheck lactic acid and obtain blood cultures. Zosyn started. No family history. Thus far gallbladder and biliary tree appear benign. Further work up as per GI recommendations. Pt is interested in a clear liquid diet. Will try it. DC if associated with increased pain or nausea.  Esophageal thickening: As per GI. Concerning for malignancy, although the patient does have GERD.  New LBBB and prolonged PR interval since last EKG in system from 04/2013: Echocardiogram pending. Troponin negative.  Hypertension: Elevated at 155/99. Possibly due to pain or the fact that his cozaar has not yet been restarted. He is receiving prn hydralazine. Will continue that  for now until it is clear that he is able to tolerate PO. HCTZ stopped as it is a possible cause of pancreatitis.  BPH: Continue Flomax as at home. Monitor.  GERD: continue protonix  Hiatal Hernia: Noted. Monitor  DVT prophylaxis: Lovenox Code Status: Full Code Family Communication: None present Disposition Plan: Home   Annabelle Rexroad, DO [facility] Direct contact: see [email or web address]  7PM-7AM contact night coverage as above   LOS: 1 day   ADDENDUM: This afternoon the patients lactic acid is 2.1. His heart rate is 114. He has a low grade temperature and a WBC count of 28.2. He has been started on sepsis protocal, cefepime and flagyl. He has been placed on telemetry.

## 2018-06-07 NOTE — Progress Notes (Signed)
Pharmacy Antibiotic Note  Timothy Khan is a 85 y.o. male admitted on 06/06/2018 with pancreatitis.  Pharmacy has been consulted for Cefepime dosing for sepsis.  Day #1 abx Tmax 99.7 WBC 28.2 SCr 1.27, CrCl 44 ml/min Lactate 2.1  Plan: Cefepime 2g IV q12h Flagyl per MD Follow up renal function & cultures  Height: 5\' 10"  (177.8 cm) Weight: 171 lb 4.8 oz (77.7 kg) IBW/kg (Calculated) : 73  Temp (24hrs), Avg:99.4 F (37.4 C), Min:98.4 F (36.9 C), Max:100.2 F (37.9 C)  Recent Labs  Lab 06/06/18 0905 06/07/18 0428 06/07/18 1532  WBC 12.1* 28.2*  --   CREATININE 1.19 1.27*  --   LATICACIDVEN 1.7  --  2.1*    Estimated Creatinine Clearance: 44.7 mL/min (A) (by C-G formula based on SCr of 1.27 mg/dL (H)).    Allergies  Allergen Reactions  . Codeine Nausea Only    Antimicrobials this admission:  3/20 Cefepime >> 3/20 Flagyl >>  Dose adjustments this admission:   Microbiology results:  3/20 BCx: sent  Thank you for allowing pharmacy to be a part of this patient's care.  Peggyann Juba, PharmD, BCPS Pager: [phone] 06/07/2018 4:44 PM

## 2018-06-07 NOTE — Therapy (Signed)
[facility] [address] [place], [place], [address] Phone: [phone]   Fax:  [phone]  Patient Details  Name: TRIG MCBRYAR MRN: [id number] Date of Birth: Sep 19, 1931 Referring Provider:  No ref. provider found  Encounter Date: 06/07/2018 PHYSICAL THERAPY DISCHARGE SUMMARY  Visits from Start of Care: 13  Current functional level related to goals / functional outcomes: Unable to assess; pt unable to return to therapy due to being hospitalized.  Will need new order to resume therapy once cleared medically.    Remaining deficits: Impaired balance   Education / Equipment: HEP   Plan: Patient agrees to discharge.  Patient goals were not met. Patient is being discharged due to a change in medical status.  ?????     Rico Junker, PT, [name] 06/07/18    2:23 PM   [facility] [address] [place], [place], [address] Phone: [phone]   Fax:  [phone]

## 2018-06-07 NOTE — Consult Note (Signed)
Referring Provider:  [facility] Primary Care Physician:  [name], MD Primary Gastroenterologist:  Sadie Haber   Reason for Consultation: Abdominal pain, pancreatitis, esophagitis  HPI: Timothy Khan is a 85 y.o. male with past medical history of GERD, peripheral neuropathy presented to the hospital with abdominal pain.  Upon initial evaluation in the emergency room, he was found to have elevated lipase of 5122, mild elevated AST ALT, normal alkaline phosphatase and T bilirubin.  CT abdomen pelvis with IV contrast showed uncomplicated acute pancreatitis, circumferential thickening of the distal esophagus as well as compression fracture of L1.  GI is consulted for further evaluation.   Patient seen and examined at bedside.  Daughter at bedside.  He has been having intermittent nausea and vomiting since late 2019.  Around 3 days ago he started having sharp epigastric abdominal pain.  Pain was nonradiating.  Denied any associated fever.  Because of ongoing pain he presented to the emergency room for further evaluation.  Denies to have abdominal pain.  Complaining of chronic constipation.  Had noted some bright blood in the stool after episodes of constipation a few weeks ago.  No bowel movement in last 3 days.  Denies dysphagia odynophagia.  Reflux is well controlled with Nexium.  Denies any unintentional weight loss.  Last colonoscopy several years ago was normal.  No previous EGD.  No family history of pancreatic issues.  Past Medical History:  Diagnosis Date  . Abnormal brain MRI    Right side  . Basal cell carcinoma of back   . BPH (benign prostatic hyperplasia)   . Concussion 1950   Baseball injury  . Foot drop, bilateral 05/02/2013  . Gait disturbance   . Gastroesophageal reflux disease   . Hyperlipidemia   . Hypertension   . Lumbago   . Peripheral neuropathy   . Skull fracture [facility])     Past Surgical History:  Procedure Laterality Date  . CATARACT EXTRACTION Bilateral   . KNEE  SURGERY Left   . skin cancer resection     basal cell, [place] carcinoma  . TONSILLECTOMY    . VASECTOMY      Prior to Admission medications   Medication Sig Start Date End Date Taking? Authorizing Provider  cetirizine (ZYRTEC) 10 MG tablet Take 10 mg by mouth daily.   Yes [provider]  Cholecalciferol (VITAMIN D3) 2000 UNITS capsule Take 2,000 Units by mouth every other day.    Yes [provider]  Coenzyme Q10 (CO Q 10) 10 MG CAPS Take 1 tablet by mouth every morning.   Yes [provider]  Esomeprazole Magnesium (NEXIUM PO) Take 22.3 mg by mouth daily.   Yes [provider]  fluticasone (FLONASE) 50 MCG/ACT nasal spray Place 2 sprays into both nostrils at bedtime.  04/16/13  Yes [provider]  gabapentin (NEURONTIN) 300 MG capsule Take 2 capsules (600 mg total) by mouth at bedtime. 12/04/17  Yes Kathrynn Ducking, MD  hydrochlorothiazide (MICROZIDE) 12.5 MG capsule Take 12.5 mg by mouth daily.  05/22/17  Yes [provider]  losartan (COZAAR) 50 MG tablet Take 50 mg by mouth daily.  05/22/17  Yes [provider]  Multiple Vitamin (MULTIVITAMIN) capsule Take 1 capsule by mouth daily.   Yes [provider]  psyllium (METAMUCIL) 58.6 % packet Take 1 packet by mouth daily.   Yes [provider]  tamsulosin (FLOMAX) 0.4 MG CAPS Take 0.4 mg by mouth at bedtime.    Yes [provider]  traMADol (ULTRAM) 50 MG tablet Take 100 mg by mouth daily.  10/01/12  Yes [provider]  triamcinolone cream (KENALOG) 0.1 %  05/10/17  Yes [provider]    Scheduled Meds: . enoxaparin (LOVENOX) injection  40 mg Subcutaneous Q24H  . fluticasone  2 spray Each Nare QHS  . gabapentin  600 mg Oral QHS  . mouth rinse  15 mL Mouth Rinse BID  . pantoprazole (PROTONIX) IV  40 mg Intravenous Q24H  . tamsulosin  0.4 mg Oral QHS   Continuous Infusions: . dextrose 5 % and 0.9 % NaCl with KCl 20 mEq/L 100 mL/hr at  06/07/18 0600   PRN Meds:.acetaminophen **OR** acetaminophen, hydrALAZINE, HYDROmorphone (DILAUDID) injection, ondansetron (ZOFRAN) IV, triamcinolone cream  Allergies as of 06/06/2018 - Review Complete 06/06/2018  Allergen Reaction Noted  . Codeine Nausea Only 03/18/2012    Family History  Problem Relation Age of Onset  . Cancer Mother        Breast cancer  . Heart attack Father   . Heart disease Brother     Social History   Socioeconomic History  . Marital status: Married    Spouse name: Not on file  . Number of children: 4  . Years of education: 7  . Highest education level: Not on file  Occupational History  . Occupation: Retired  Scientific laboratory technician  . Financial resource strain: Not on file  . Food insecurity:    Worry: Not on file    Inability: Not on file  . Transportation needs:    Medical: Not on file    Non-medical: Not on file  Tobacco Use  . Smoking status: Former Smoker    Last attempt to quit: 08/18/1984    Years since quitting: 33.8  . Smokeless tobacco: Never Used  Substance and Sexual Activity  . Alcohol use: No  . Drug use: No  . Sexual activity: Not on file  Lifestyle  . Physical activity:    Days per week: Not on file    Minutes per session: Not on file  . Stress: Not on file  Relationships  . Social connections:    Talks on phone: Not on file    Gets together: Not on file    Attends religious service: Not on file    Active member of club or organization: Not on file    Attends meetings of clubs or organizations: Not on file    Relationship status: Not on file  . Intimate partner violence:    Fear of current or ex partner: Not on file    Emotionally abused: Not on file    Physically abused: Not on file    Forced sexual activity: Not on file  Other Topics Concern  . Not on file  Social History Narrative   Patient states he is ambidextrous but is predominantly left handed.   Patient drinks 3 cups of coffee per day.    Review of Systems:  Review of Systems  Constitutional: Negative for chills and fever.  HENT: Negative for hearing loss and tinnitus.   Eyes: Negative for blurred vision and double vision.  Respiratory: Negative for cough, hemoptysis and sputum production.   Cardiovascular: Negative for chest pain and palpitations.  Gastrointestinal: Positive for abdominal pain, blood in stool, constipation, heartburn, nausea and vomiting.  Genitourinary: Negative for dysuria and urgency.  Musculoskeletal: Positive for falls and myalgias.  Skin: Negative for itching and rash.  Neurological: Positive for tremors. Negative for seizures.  Endo/Heme/Allergies: Does  not bruise/bleed easily.  Psychiatric/Behavioral: Negative for depression and substance abuse.    Physical Exam: Vital signs: Vitals:   06/06/18 2127 06/07/18 0532  BP: (!) 149/79 130/72  Pulse: (!) 120 (!) 112  Resp: 17 18  Temp: 100.2 F (37.9 C) 98.4 F (36.9 C)  SpO2: 92% 93%     Physical Exam  Constitutional: He is oriented to person, place, and time. He appears well-developed and well-nourished.  HENT:  Head: Normocephalic and atraumatic.  Mouth/Throat: Oropharynx is clear and moist. No oropharyngeal exudate.  Eyes: EOM are normal. No scleral icterus.  Neck: Normal range of motion. Neck supple.  Cardiovascular:  Tachycardia with irregular rate.  Pulmonary/Chest: Effort normal and breath sounds normal. No respiratory distress.  Abdominal: Soft. Bowel sounds are normal. He exhibits distension. There is abdominal tenderness. There is no rebound and no guarding.  Epigastric tenderness to palpation with mild distention  Musculoskeletal: Normal range of motion.     Comments: Trace edema bilaterally  Neurological: He is alert and oriented to person, place, and time.  Skin: Skin is warm. No erythema.  Psychiatric: He has a normal mood and affect. Judgment normal.  Vitals reviewed.   GI:  Lab Results: Recent Labs    06/06/18 0905 06/07/18 0428   WBC 12.1* 28.2*  HGB 14.7 15.3  HCT 45.2 48.0  PLT 260 280   BMET Recent Labs    06/06/18 0905 06/07/18 0428  NA 135 137  K 3.8 4.8  CL 99 105  CO2 28 23  GLUCOSE 193* 149*  BUN 17 18  CREATININE 1.19 1.27*  CALCIUM 8.8* 7.8*   LFT Recent Labs    06/07/18 0428  PROT 6.3*  ALBUMIN 3.3*  AST 73*  ALT 84*  ALKPHOS 70  BILITOT 1.0   PT/INR No results for input(s): LABPROT, INR in the last 72 hours.   Studies/Results: Ct Abdomen Pelvis W Contrast  Result Date: 06/06/2018 CLINICAL DATA:  85 year old male with a history abdominal pain nausea and vomiting EXAM: CT ABDOMEN AND PELVIS WITH CONTRAST TECHNIQUE: Multidetector CT imaging of the abdomen and pelvis was performed using the standard protocol following bolus administration of intravenous contrast. CONTRAST:  169mL OMNIPAQUE IOHEXOL 300 MG/ML  SOLN COMPARISON:  Chest x-ray 03/19/2018, MRI 04/14/2011 FINDINGS: Lower chest: Atelectatic changes at the lung bases. Circumferential thickening of the distal esophagus above a hiatal hernia. Hepatobiliary: Low-density lesions of left and right liver with well-defined margins and no internal enhancement, all of which measure fluid density. Largest in the left measures 4.4 cm transverse diameter. No radiopaque stones within the gallbladder. There is no ductal dilatation of the extrahepatic or intrahepatic biliary ducts. No pericholecystic fluid. Pancreas: Inflammatory changes of the fat surrounding the pancreas, with trace fluid within the retroperitoneal fascial planes. The enhancement/attenuation of liver parenchyma is maintained, with no focal hypoperfusion/necrosis. Unremarkable appearance of the pancreatic duct. Spleen: Unremarkable spleen Adrenals/Urinary Tract: Unremarkable adrenal glands. No evidence of hydronephrosis or nephrolithiasis. Unremarkable course the bilateral ureters. Partial distention of urinary bladder. Impression on the bladder base by the prostate. Stomach/Bowel:  Unremarkable stomach. Unremarkable small bowel. No abnormal distention. No transition point. No focal wall thickening. Normal appendix. Mild stool burden. Diverticular change of the sigmoid colon. No acute inflammation. Vascular/Lymphatic: Minimal atherosclerotic calcifications of the abdominal aorta. Calcifications at the mesenteric artery origins, with calcified plaque at the celiac artery origin and soft plaque at the SMA origin. IMA is patent. Bilateral renal arteries patent. Reproductive: Transverse diameter of prostate measures 5.2 cm.  Other: No hernia. Musculoskeletal: New compression fracture of L1 with 50% height loss anteriorly. The posterosuperior endplate demonstrates mild retropulsion without significant bony canal narrowing. Vacuum disc phenomenon. This compression fracture was not present on the MRI of 04/14/2011, and does not appear to have been present on the plain film chest x-ray of 03/19/2018. Degenerative changes of the hips. Degenerative changes of the lumbar spine. IMPRESSION: Acute pancreatitis, uncomplicated. The above results were discussed by telephone at the time of interpretation on 06/06/2018 at 10:52 am with Dr. Gerlene Fee. Circumferential thickening of the distal esophagus. Recommend correlation with a history of GERD, and referral for GI evaluation may be considered for potential upper endoscopy, as early malignancy can not be excluded. Compression fracture of L1 which is new from plain film dated 03/19/2018. Approximately 50% height loss. Diverticular change without evidence of acute diverticulitis. Low-density lesions of the liver which are most likely benign biliary cysts. Aortic Atherosclerosis (ICD10-I70.0). Electronically Signed   By: Corrie Mckusick D.O.   On: 06/06/2018 10:53    Impression/Plan: -Acute pancreatitis.  Unspecified etiology.  No gallstones on CT scan.  No alcohol use.  Relatively normal triglycerides.  No new medications.  Normal IgG level. -Abnormal CT scan  showing circumferential distal esophageal thickening. -Mild elevated AST/ALT.normal T bili and alkaline phosphatase. -Intermittent nausea and vomiting since late 2019. -Acid reflux.  Symptoms well controlled with Nexium.  Denies dysphagia odynophagia. -Intermittent rectal bleeding after episodes of constipation.  Last colonoscopy several years ago was normal.  Recommendations ------------------------ -Patient with worsening leukocytosis and elevated creatinine which points towards hemoconcentration from his worsening pancreatitis.  -Check Ultrasound right upper quadrant.  If negative-  recommend HIDA scan for further evaluation because of intermittent nausea and vomiting since late 2019.  - start aggressive IV hydration.  Start normal saline 250 cc/h for 24 hours.  - Continue IV twice daily PPI for now.    -He is currently having worsening pancreatitis.  He denies dysphagia, odynophagia.  Do not see need for urgent EGD at this time.  He may benefit from EGD prior to discharge.  -GI will follow.  Okay to have a clear liquid diet after ultrasound.   LOS: 1 day   Otis Brace  MD, FACP 06/07/2018, 8:09 AM  Contact #  [phone]

## 2018-06-07 NOTE — Progress Notes (Signed)
Lactic acid 2.1 called at 1621, Md paged immediately and responded back with orders. Put a transfer order for telemetry and called Rapid Nurse as orders instructed. Received vitals and started medications and boluses. Bed became available, called for report and transfer was made at 1800.

## 2018-06-07 NOTE — Progress Notes (Signed)
MD paged results of lactic Acid 2.4. Await response

## 2018-06-07 NOTE — Plan of Care (Signed)

## 2018-06-08 ENCOUNTER — Encounter (HOSPITAL_COMMUNITY): Payer: Self-pay

## 2018-06-08 ENCOUNTER — Inpatient Hospital Stay (HOSPITAL_COMMUNITY): Payer: Medicare Other

## 2018-06-08 ENCOUNTER — Other Ambulatory Visit: Payer: Self-pay

## 2018-06-08 DIAGNOSIS — R6520 Severe sepsis without septic shock: Secondary | ICD-10-CM

## 2018-06-08 DIAGNOSIS — A419 Sepsis, unspecified organism: Secondary | ICD-10-CM

## 2018-06-08 LAB — CBC WITH DIFFERENTIAL/PLATELET
ABS IMMATURE GRANULOCYTES: 0.38 10*3/uL — AB (ref 0.00–0.07)
Basophils Absolute: 0 10*3/uL (ref 0.0–0.1)
Basophils Relative: 0 %
Eosinophils Absolute: 0.1 10*3/uL (ref 0.0–0.5)
Eosinophils Relative: 0 %
HEMATOCRIT: 42.5 % (ref 39.0–52.0)
HEMOGLOBIN: 13.3 g/dL (ref 13.0–17.0)
Immature Granulocytes: 2 %
LYMPHS PCT: 4 %
Lymphs Abs: 0.9 10*3/uL (ref 0.7–4.0)
MCH: 29 pg (ref 26.0–34.0)
MCHC: 31.3 g/dL (ref 30.0–36.0)
MCV: 92.6 fL (ref 80.0–100.0)
Monocytes Absolute: 1 10*3/uL (ref 0.1–1.0)
Monocytes Relative: 4 %
Neutro Abs: 22 10*3/uL — ABNORMAL HIGH (ref 1.7–7.7)
Neutrophils Relative %: 90 %
Platelets: 205 10*3/uL (ref 150–400)
RBC: 4.59 MIL/uL (ref 4.22–5.81)
RDW: 13.2 % (ref 11.5–15.5)
WBC: 24.4 10*3/uL — ABNORMAL HIGH (ref 4.0–10.5)
nRBC: 0 % (ref 0.0–0.2)

## 2018-06-08 LAB — LACTIC ACID, PLASMA
Lactic Acid, Venous: 1.2 mmol/L (ref 0.5–1.9)
Lactic Acid, Venous: 1.4 mmol/L (ref 0.5–1.9)

## 2018-06-08 LAB — BASIC METABOLIC PANEL
Anion gap: 6 (ref 5–15)
BUN: 16 mg/dL (ref 8–23)
CO2: 21 mmol/L — AB (ref 22–32)
Calcium: 6.8 mg/dL — ABNORMAL LOW (ref 8.9–10.3)
Chloride: 109 mmol/L (ref 98–111)
Creatinine, Ser: 0.98 mg/dL (ref 0.61–1.24)
GFR calc Af Amer: >60 mL/min (ref >60)
GFR calc non Af Amer: >60 mL/min (ref >60)
Glucose, Bld: 130 mg/dL — ABNORMAL HIGH (ref 70–99)
Potassium: 3.5 mmol/L (ref 3.5–5.1)
Sodium: 136 mmol/L (ref 135–145)

## 2018-06-08 MED ORDER — DILTIAZEM HCL 30 MG PO TABS
30.0000 mg | ORAL_TABLET | Freq: Four times a day (QID) | ORAL | Status: DC
  Administered 2018-06-08 – 2018-06-09 (×4): 30 mg via ORAL
  Filled 2018-06-08 (×4): qty 1

## 2018-06-08 MED ORDER — ENOXAPARIN SODIUM 80 MG/0.8ML ~~LOC~~ SOLN: 1.0000 mg/kg | Freq: Two times a day (BID) | SUBCUTANEOUS | Status: DC

## 2018-06-08 MED ORDER — HYDROMORPHONE HCL 1 MG/ML IJ SOLN
0.5000 mg | INTRAMUSCULAR | Status: DC | PRN
  Administered 2018-06-08 (×4): 0.5 mg via INTRAVENOUS
  Administered 2018-06-09 (×4): 1 mg via INTRAVENOUS
  Administered 2018-06-09 – 2018-06-10 (×4): 0.5 mg via INTRAVENOUS
  Filled 2018-06-08 (×12): qty 1

## 2018-06-08 MED ORDER — ENOXAPARIN SODIUM 40 MG/0.4ML ~~LOC~~ SOLN
40.0000 mg | SUBCUTANEOUS | Status: DC
  Administered 2018-06-08 – 2018-06-14 (×7): 40 mg via SUBCUTANEOUS
  Filled 2018-06-08 (×7): qty 0.4

## 2018-06-08 MED ORDER — IOHEXOL 300 MG/ML  SOLN: 25.0000 mL | Freq: Once | INTRAMUSCULAR | Status: DC | PRN

## 2018-06-08 MED ORDER — METOPROLOL TARTRATE 5 MG/5ML IV SOLN
5.0000 mg | Freq: Once | INTRAVENOUS | Status: AC
  Administered 2018-06-08: 5 mg via INTRAVENOUS
  Filled 2018-06-08: qty 5

## 2018-06-08 MED ORDER — DILTIAZEM HCL 25 MG/5ML IV SOLN
10.0000 mg | Freq: Once | INTRAVENOUS | Status: AC | PRN
  Administered 2018-06-08: 10 mg via INTRAVENOUS
  Filled 2018-06-08: qty 5

## 2018-06-08 MED ORDER — LACTATED RINGERS IV SOLN
INTRAVENOUS | Status: DC
  Administered 2018-06-08 – 2018-06-14 (×8): via INTRAVENOUS

## 2018-06-08 MED ORDER — DILTIAZEM HCL 25 MG/5ML IV SOLN
20.0000 mg | Freq: Once | INTRAVENOUS | Status: DC
  Administered 2018-06-08: 20 mg via INTRAVENOUS
  Filled 2018-06-08: qty 5

## 2018-06-08 MED ORDER — SODIUM CHLORIDE (PF) 0.9 % IJ SOLN
INTRAMUSCULAR | Status: AC
  Filled 2018-06-08: qty 50

## 2018-06-08 MED ORDER — IOHEXOL 300 MG/ML  SOLN
100.0000 mL | Freq: Once | INTRAMUSCULAR | Status: AC | PRN
  Administered 2018-06-08: 100 mL via INTRAVENOUS

## 2018-06-08 NOTE — Plan of Care (Signed)

## 2018-06-08 NOTE — Progress Notes (Signed)
PROGRESS NOTE  Timothy Khan:[id number] DOB: 16-May-1931 DOA: 06/06/2018 PCP: [name], MD  Brief History   The patient is a 85 yr old man who carries a past medical history significant for BPH, Bilateral foot drop, gait disturbance, GERD, hyperlipdemia, hypertension, peripheral neuropathy, and history of cuncussion, skull fracture and abnormal MRI brain.  The patient presented to [facility] on 06/06/2018 with a complaint of severe epigastric abdominal pain, nausea and vomiting early that morning. He stated that the pain was sharp. 10/10 in severity. He states that he had multiple episodes of emesis and then dry heaves. He denies constipation of diarrhea. He denies fevers or chills. He has had no sick contact. He has had no cough, shortness of breath, or wheezes, He does not drink alcohol. He has never had anything like this in the past.  In the ED the patient was febrile, with a high blood pressure. His lipase was 5122 with a mildly elevated ALT. Remainder of LFT's were unremarkable. CT abdomen and pelvis revealed inflammatory changes in the peripancreatic fat. There are organized fluid collections surrounding the pancreas as well. This is no biliary ductal dilatation. No stones seen in gallbladder. It also demonstrates circumferential thicking of the distal esophagus. And an old compression fracture of L1.  GI was consulted and the patient underwent a right upper quadrant ultrasound that demonstrated no evidence of cholelithiasis.  On 06/07/2018 the patient had lactic acidosis at 2.3 and an increase in his WBC count to 28. He was started on IV cefepime and the sepsis protocol. WBC is down and lactic acid is 1.2 on 06/08/2018.  The patient wants to start clear liquids. He states that he has had no BM x 4 days. Last night the patient had runs of V. Tach and atrial fibrillation this morning. Last night he was given metoprolol 5 mg x1. This morning's atrial fibrillation converted to sinus without  intervention.  Consultants   Gastroenterology  Procedures   None  Antibiotics   Cefepime  Interval History/Subjective  The patient is a 85 yr old man who carries a past medical history significant for BPH, Bilateral foot drop, gait disturbance, GERD, hyperlipdemia, hypertension, peripheral neuropathy, and history of cuncussion, skull fracture and abnormal MRI brain.  The patient presented to [facility] on 06/06/2018 with a complaint of severe epigastric abdominal pain, nausea and vomiting early that morning. He stated that the pain was sharp. 10/10 in severity. He states that he had multiple episodes of emesis and then dry heaves. He denies constipation of diarrhea. He denies fevers or chills. He has had no sick contact. He has had no cough, shortness of breath, or wheezes, He does not drink alcohol. He has never had anything like this in the past.  In the ED the patient was febrile, with a high blood pressure. His lipase was 5122 with a mildly elevated ALT. Remainder of LFT's were unremarkable. CT abdomen and pelvis revealed inflammatory changes in the peripancreatic fat. There are organized fluid collections surrounding the pancreas as well. This is no biliary ductal dilatation. No stones seen in gallbladder. It also demonstrates circumferential thicking of the distal esophagus. And an old compression fracture of L1.  GI was consulted and the patient underwent a right upper quadrant ultrasound that demonstrated no evidence of cholecystitis.  On 06/07/2018 the patient had lactic acidosis at 2.3 and an increase in his WBC count to 28. He was started on IV cefepime and the sepsis protocol. WBC is down and lactic acid is  1.2 on 06/08/2018.  The patient wants to start clear liquids. He states that he has had no BM x 4 days. Last night the patient had runs of V. Tach and atrial fibrillation this morning. Last night he was given metoprolol 5 mg x1. This morning's atrial fibrillation converted to sinus  without intervention.  The patient is awake, alert, and oriented x 3. He continues to complain of epigastric pain, but less than yesterday. He wants to start a clear liquid diet.  Objective   Vitals:  Vitals:   06/08/18 0805 06/08/18 0859  BP: (!) 148/83 (!) 144/74  Pulse: (!) 113 (!) 119  Resp: (!) 21   Temp: 98.9 F (37.2 C) 98.4 F (36.9 C)  SpO2: 95% 94%    Exam:  Constitutional:   The patient is awake, alert, and oriented x 3. No acute distress. Respiratory:   No increased work of breathing.   No wheezes, rales, or rhonchi.  No tactile fremitus. Cardiovascular:   Tachycardic with rate in the 100's.. No murmurs, ectopy or gallups.  No LE extremity edema    Normal pedal pulses Abdomen:   Abdomen is mildly distended. It is tender especially in the epigastrum and less so diffusely. Less tender than yesterday.  No hernias, masses, or organomegaly are appreciated.  Normoactive bowel sounds. Musculoskeletal:   No cyanosis, clubbing or edema Skin:   No rashes, lesions, ulcers  palpation of skin: no induration or nodules  Warm, dry, and intact Neurologic:   CN 2-12 intact  Patient is moving all extremities. Psychiatric:   Mental status o Mood, affect appropriate o Orientation to person, place, time   judgment and insight appear intact     I have personally reviewed the following:   Today's Data   CBC, CMP, Vitals, Lactic Acid, EKG.  Imaging   None   Scheduled Meds:  diltiazem  30 mg Oral Q6H   enoxaparin (LOVENOX) injection  40 mg Subcutaneous Q24H   fluticasone  2 spray Each Nare QHS   gabapentin  600 mg Oral QHS   mouth rinse  15 mL Mouth Rinse BID   pantoprazole (PROTONIX) IV  40 mg Intravenous Q24H   tamsulosin  0.4 mg Oral QHS   Continuous Infusions:  ceFEPime (MAXIPIME) IV 2 g (06/08/18 0527)   lactated ringers 125 mL/hr at 06/08/18 1140   metronidazole 500 mg (06/08/18 0859)    Principal Problem:   Acute  pancreatitis Active Problems:   Hereditary and idiopathic peripheral neuropathy   Hypertension   Foot drop, bilateral   BPH (benign prostatic hyperplasia)   LBBB (left bundle branch block)   GERD (gastroesophageal reflux disease)   Esophageal thickening   Hiatal hernia   A & P  Sepsis: Resolving. Tachycardia, low grade temperature, WBC 28.3, Lactic acid of 2.4. Blood cultures x 2 obtained. Patient started on cefepime. Sepsis protocol initiated. Today WBC down to 24.4 and lactic acid down to 1.2. Pt remains tachycardic, but afebrile. Monitor.  Acute pancreatitis: Pain control, IV fluids, anti-emetics. Patient was taking HCTZ at home. Perhaps is a cause. He is not a drinker. TGL is 166. WBC up from 12.1 to 28.2. Will recheck lactic acid and obtain blood cultures. Zosyn started. No family history. Thus far gallbladder and biliary tree appear benign. Further work up as per GI recommendations. Pt is interested in a clear liquid diet.  Esophageal thickening: As per GI. Concerning for malignancy, although the patient does have GERD.  New LBBB and prolonged PR interval  since last EKG in system from 04/2013: Echocardiogram pending. Troponin negative.  Hypertension: Elevated at 155/99. Possibly due to pain or the fact that his cozaar has not yet been restarted. He is receiving prn hydralazine. Will continue that for now until it is clear that he is able to tolerate PO. HCTZ stopped as it is a possible cause of pancreatitis.  BPH: Continue Flomax as at home. Monitor.  GERD: continue protonix  Hiatal Hernia: Noted. Monitor  DVT prophylaxis: Lovenox Code Status: Full Code Family Communication: None present Disposition Plan: Home   Jonnie Kubly, DO [facility] Direct contact: see [email or web address]  7PM-7AM contact night coverage as above   LOS: 2 days

## 2018-06-08 NOTE — Progress Notes (Signed)
Subjective: The patient was seen and examined at bedside.  Continues to have upper abdominal pain, denies nausea or vomiting and wants to start clear liquids. He has not had a bowel movement in 4 days. Last night, he had runs of V. tach A. fib and RVR was given metoprolol 5 mg IV, currently in sinus tachycardia.   Objective: Vital signs in last 24 hours: Temp:  [98.4 F (36.9 C)-100.2 F (37.9 C)] 98.4 F (36.9 C) (03/21 0859) Pulse Rate:  [101-119] 119 (03/21 0859) Resp:  [14-21] 21 (03/21 0805) BP: (144-183)/(74-99) 144/74 (03/21 0859) SpO2:  [90 %-95 %] 94 % (03/21 0859) Weight change:  Last BM Date: 06/04/18  PE: Not in distress GENERAL: No icterus, no pallor ABDOMEN: Distended, mild generalized tenderness, hypoactive bowel sounds EXTREMITIES: No edema  Lab Results: Results for orders placed or performed during the hospital encounter of 06/06/18 (from the past 48 hour(s))  Triglycerides     Status: Abnormal   Collection Time: 06/06/18  4:49 PM  Result Value Ref Range   Triglycerides 166 (H) <150 mg/dL    Comment: Performed at [facility], [place] [address] [place], [place] [address]  ANA     Status: None   Collection Time: 06/06/18  4:51 PM  Result Value Ref Range   Anti Nuclear Antibody (ANA) Negative Negative    Comment: (NOTE) Performed At: [facility] [place], [place] [id number] Rush Farmer MD [id number]   IgG for subclass interpretation     Status: None   Collection Time: 06/06/18  4:51 PM  Result Value Ref Range   IgG (Immunoglobin G), Serum 1,276 700 - 1,600 mg/dL    Comment: (NOTE)    **Effective June 17, 2018, Immunoglobulin G, Qn,**      Serum reference interval will be changing to:             Age                Male          Male         0  - 10 days        496 - 2505     397 - 1231        11d -  6 months      175 -  639     184 -  697          7 - 11 months      261 -  673     419 -  787          1  -  3 years       428 - 3790     240 - 9735          4 -  6 years       538 - 3299     242 - 6834          1 -  9 years       82 - 9622     297 - 1350         10 - 11 years       601 - 9892     119 - 4174         08 - 13 years       57 - 1448     185 - 6314         97 - 02  years       630 - 1610     960 - 1463         16 - 19 years       671 - 4540     981 - 1475             >19 years       [phone] - 1602 Performed At: [facility] [place], [place] [id number] Rush Farmer MD VH:[id number]   IgG 4     Status: None   Collection Time: 06/06/18  4:51 PM  Result Value Ref Range   IgG, Subclass 4 17 2  - 96 mg/dL    Comment: (NOTE) Performed At: [facility] [place], [place] [id number] Rush Farmer MD UV:[id number]   Comprehensive metabolic panel     Status: Abnormal   Collection Time: 06/07/18  4:28 AM  Result Value Ref Range   Sodium 137 135 - 145 mmol/L   Potassium 4.8 3.5 - 5.1 mmol/L   Chloride 105 98 - 111 mmol/L   CO2 23 22 - 32 mmol/L   Glucose, Bld 149 (H) 70 - 99 mg/dL   BUN 18 8 - 23 mg/dL   Creatinine, Ser 1.27 (H) 0.61 - 1.24 mg/dL   Calcium 7.8 (L) 8.9 - 10.3 mg/dL   Total Protein 6.3 (L) 6.5 - 8.1 g/dL   Albumin 3.3 (L) 3.5 - 5.0 g/dL   AST 73 (H) 15 - 41 U/L   ALT 84 (H) 0 - 44 U/L   Alkaline Phosphatase 70 38 - 126 U/L   Total Bilirubin 1.0 0.3 - 1.2 mg/dL   GFR calc non Af Amer 52 (L) >60 mL/min   GFR calc Af Amer 60 (L) >60 mL/min   Anion gap 9 5 - 15    Comment: Performed at [facility], [place] [address], [place], [place] [address]  CBC     Status: Abnormal   Collection Time: 06/07/18  4:28 AM  Result Value Ref Range   WBC 28.2 (H) 4.0 - 10.5 K/uL   RBC 5.30 4.22 - 5.81 MIL/uL   Hemoglobin 15.3 13.0 - 17.0 g/dL   HCT 48.0 39.0 - 52.0 %   MCV 90.6 80.0 - 100.0 fL   MCH 28.9 26.0 - 34.0 pg   MCHC 31.9 30.0 - 36.0 g/dL   RDW 13.0 11.5 - 15.5 %   Platelets 280 150 - 400 K/uL    nRBC 0.0 0.0 - 0.2 %    Comment: Performed at [facility], [place] [address] [place], [place] [address]  Glucose, capillary     Status: Abnormal   Collection Time: 06/07/18  8:05 AM  Result Value Ref Range   Glucose-Capillary 156 (H) 70 - 99 mg/dL  Lactic acid, plasma     Status: Abnormal   Collection Time: 06/07/18  3:32 PM  Result Value Ref Range   Lactic Acid, Venous 2.1 (HH) 0.5 - 1.9 mmol/L    Comment: CRITICAL RESULT CALLED TO, READ BACK BY AND VERIFIED WITH: CLARK,L RN @1621  ON 06/07/2018 JACKSON,K Performed at [facility], [place] [address], [place], [place] [address]   Culture, blood (x 2)     Status: None (Preliminary result)   Collection Time: 06/07/18  5:09 PM  Result Value Ref Range   Specimen Description      BLOOD LEFT ANTECUBITAL Performed at [facility],  [place] [address], [place], [place] [address]    Special Requests      BOTTLES DRAWN AEROBIC ONLY Blood Culture adequate volume Performed at [facility] [address] [place], [place] [address]    Culture      NO GROWTH < 24 HOURS Performed at [facility] [address] [place], [place] [address]    Report Status PENDING   CBC with Differential     Status: Abnormal   Collection Time: 06/07/18  5:09 PM  Result Value Ref Range   WBC 28.3 (H) 4.0 - 10.5 K/uL    Comment: WHITE COUNT CONFIRMED ON SMEAR   RBC 5.12 4.22 - 5.81 MIL/uL   Hemoglobin 14.8 13.0 - 17.0 g/dL   HCT 47.3 39.0 - 52.0 %   MCV 92.4 80.0 - 100.0 fL   MCH 28.9 26.0 - 34.0 pg   MCHC 31.3 30.0 - 36.0 g/dL   RDW 13.2 11.5 - 15.5 %   Platelets 231 150 - 400 K/uL   nRBC 0.0 0.0 - 0.2 %   Neutrophils Relative % 91 %   Neutro Abs 25.9 (H) 1.7 - 7.7 K/uL   Lymphocytes Relative 4 %   Lymphs Abs 1.0 0.7 - 4.0 K/uL   Monocytes Relative 4 %   Monocytes Absolute 1.1 (H) 0.1 - 1.0 K/uL   Eosinophils Relative 0 %   Eosinophils Absolute 0.1 0.0 - 0.5 K/uL   Basophils  Relative 0 %   Basophils Absolute 0.0 0.0 - 0.1 K/uL   Immature Granulocytes 1 %   Abs Immature Granulocytes 0.28 (H) 0.00 - 0.07 K/uL    Comment: Performed at [facility], [place] [address] [place], [place] [address]  Comprehensive metabolic panel     Status: Abnormal   Collection Time: 06/07/18  5:09 PM  Result Value Ref Range   Sodium 136 135 - 145 mmol/L   Potassium 4.1 3.5 - 5.1 mmol/L   Chloride 104 98 - 111 mmol/L   CO2 23 22 - 32 mmol/L   Glucose, Bld 142 (H) 70 - 99 mg/dL   BUN 18 8 - 23 mg/dL   Creatinine, Ser 1.26 (H) 0.61 - 1.24 mg/dL   Calcium 7.3 (L) 8.9 - 10.3 mg/dL   Total Protein 6.3 (L) 6.5 - 8.1 g/dL   Albumin 3.2 (L) 3.5 - 5.0 g/dL   AST 47 (H) 15 - 41 U/L   ALT 65 (H) 0 - 44 U/L   Alkaline Phosphatase 64 38 - 126 U/L   Total Bilirubin 1.3 (H) 0.3 - 1.2 mg/dL   GFR calc non Af Amer 52 (L) >60 mL/min   GFR calc Af Amer >60 >60 mL/min   Anion gap 9 5 - 15    Comment: Performed at [facility], [place] [address], [place], [place] [address]  Procalcitonin     Status: None   Collection Time: 06/07/18  5:09 PM  Result Value Ref Range   Procalcitonin 0.35 ng/mL    Comment:        Interpretation: PCT (Procalcitonin) <= 0.5 ng/mL: Systemic infection (sepsis) is not likely. Local bacterial infection is possible. (NOTE)       Sepsis PCT Algorithm           Lower Respiratory Tract  Infection PCT Algorithm    ----------------------------     ----------------------------         PCT < 0.25 ng/mL                PCT < 0.10 ng/mL         Strongly encourage             Strongly discourage   discontinuation of antibiotics    initiation of antibiotics    ----------------------------     -----------------------------       PCT 0.25 - 0.50 ng/mL            PCT 0.10 - 0.25 ng/mL               OR       >80% decrease in PCT            Discourage initiation of                                             antibiotics      Encourage discontinuation           of antibiotics    ----------------------------     -----------------------------         PCT >= 0.50 ng/mL              PCT 0.26 - 0.50 ng/mL               AND        <80% decrease in PCT             Encourage initiation of                                             antibiotics       Encourage continuation           of antibiotics    ----------------------------     -----------------------------        PCT >= 0.50 ng/mL                  PCT > 0.50 ng/mL               AND         increase in PCT                  Strongly encourage                                      initiation of antibiotics    Strongly encourage escalation           of antibiotics                                     -----------------------------                                           PCT <= 0.25 ng/mL  OR                                        > 80% decrease in PCT                                     Discontinue / Do not initiate                                             antibiotics Performed at [facility] [address], [place], [place] [address]   Protime-INR     Status: Abnormal   Collection Time: 06/07/18  5:09 PM  Result Value Ref Range   Prothrombin Time 15.3 (H) 11.4 - 15.2 seconds   INR 1.2 0.8 - 1.2    Comment: (NOTE) INR goal varies based on device and disease states. Performed at [facility], [place] [address] [place], [place] [address]   APTT     Status: None   Collection Time: 06/07/18  5:09 PM  Result Value Ref Range   aPTT 30 24 - 36 seconds    Comment: Performed at [facility], [place] [address], [place], [place] [address]  Lactic acid, plasma     Status: Abnormal   Collection Time: 06/07/18  5:09 PM  Result Value Ref Range   Lactic Acid, Venous 2.4 (HH) 0.5 - 1.9 mmol/L    Comment: CRITICAL RESULT CALLED TO, READ BACK BY AND  VERIFIED WITH: HUFF,J RN @1813  ON 06/07/2018 JACKSON,K Performed at [facility], [place] [address], [place], [place] [address]   Culture, blood (x 2)     Status: None (Preliminary result)   Collection Time: 06/07/18  5:14 PM  Result Value Ref Range   Specimen Description      BLOOD LEFT FOREARM Performed at [facility] [address], [place], [place] [address]    Special Requests      BOTTLES DRAWN AEROBIC AND ANAEROBIC Blood Culture adequate volume Performed at [facility] [address], [place], [place] [address]    Culture      NO GROWTH < 24 HOURS Performed at [facility] [address], [place], [place] [address]    Report Status PENDING   Lactic acid, plasma     Status: Abnormal   Collection Time: 06/07/18  7:35 PM  Result Value Ref Range   Lactic Acid, Venous 2.3 (HH) 0.5 - 1.9 mmol/L    Comment: CRITICAL RESULT CALLED TO, READ BACK BY AND VERIFIED WITH: ASARO,J RN @2021  ON 06/07/2018 JACKSON,K Performed at [facility], [place] [address] [place], [place] [address]   CBC with Differential/Platelet     Status: Abnormal   Collection Time: 06/08/18  5:09 AM  Result Value Ref Range   WBC 24.4 (H) 4.0 - 10.5 K/uL    Comment: WHITE COUNT CONFIRMED ON SMEAR   RBC 4.59 4.22 - 5.81 MIL/uL   Hemoglobin 13.3 13.0 - 17.0 g/dL   HCT 42.5 39.0 - 52.0 %   MCV 92.6 80.0 - 100.0 fL   MCH 29.0 26.0 - 34.0 pg   MCHC 31.3 30.0 - 36.0 g/dL  RDW 13.2 11.5 - 15.5 %   Platelets 205 150 - 400 K/uL   nRBC 0.0 0.0 - 0.2 %   Neutrophils Relative % 90 %   Neutro Abs 22.0 (H) 1.7 - 7.7 K/uL   Lymphocytes Relative 4 %   Lymphs Abs 0.9 0.7 - 4.0 K/uL   Monocytes Relative 4 %   Monocytes Absolute 1.0 0.1 - 1.0 K/uL   Eosinophils Relative 0 %   Eosinophils Absolute 0.1 0.0 - 0.5 K/uL   Basophils Relative 0 %   Basophils Absolute 0.0 0.0 - 0.1 K/uL   WBC Morphology DOHLE BODIES    Immature Granulocytes 2 %    Abs Immature Granulocytes 0.38 (H) 0.00 - 0.07 K/uL    Comment: Performed at [facility], [place] [address], [place], [place] [address]  Basic metabolic panel     Status: Abnormal   Collection Time: 06/08/18  5:09 AM  Result Value Ref Range   Sodium 136 135 - 145 mmol/L   Potassium 3.5 3.5 - 5.1 mmol/L   Chloride 109 98 - 111 mmol/L   CO2 21 (L) 22 - 32 mmol/L   Glucose, Bld 130 (H) 70 - 99 mg/dL   BUN 16 8 - 23 mg/dL   Creatinine, Ser 0.98 0.61 - 1.24 mg/dL   Calcium 6.8 (L) 8.9 - 10.3 mg/dL   GFR calc non Af Amer >60 >60 mL/min   GFR calc Af Amer >60 >60 mL/min   Anion gap 6 5 - 15    Comment: Performed at [facility], [place] [address] [place], [place] [address]  Lactic acid, plasma     Status: None   Collection Time: 06/08/18  8:27 AM  Result Value Ref Range   Lactic Acid, Venous 1.2 0.5 - 1.9 mmol/L    Comment: Performed at [facility], [place] [address], [place], [place] [address]    Studies/Results: Dg Chest Port 1 View  Result Date: 06/07/2018 CLINICAL DATA:  Sepsis. EXAM: PORTABLE CHEST 1 VIEW COMPARISON:  PA and lateral chest 03/19/2018 11/18/2009. FINDINGS: Lung volumes are much lower than on the comparison examinations. Trace left pleural effusion and basilar atelectasis are unchanged since the most recent study. Right lung is clear. No pneumothorax. Remote left sixth and seventh rib fractures noted. No acute bony abnormality. IMPRESSION: Trace left pleural effusion and subsegmental atelectasis left lung base are unchanged since the most recent study. Electronically Signed   By: Inge Rise M.D.   On: 06/07/2018 17:37   US Abdomen Limited Ruq  Result Date: 06/07/2018 CLINICAL DATA:  Pancreatitis. EXAM: ULTRASOUND ABDOMEN LIMITED RIGHT UPPER QUADRANT COMPARISON:  CT 06/06/2018 FINDINGS: Gallbladder: No evidence of gallstones or sludge. No significant gallbladder wall thickening. Negative sonographic Murphy sign. No  adjacent free fluid. Common bile duct: Diameter: 2.6 mm. Liver: Mild increased parenchymal echogenicity compatible with steatosis with possible focal fatty sparing centrally. Tiny amount of perihepatic fluid. Several liver cysts as seen on recent CT. Portal vein is patent on color Doppler imaging with normal direction of blood flow towards the liver. IMPRESSION: No evidence of cholelithiasis. Mild hepatic steatosis with multiple cysts unchanged from recent CT. Tiny amount of perihepatic fluid. Electronically Signed   By: Marin Olp M.D.   On: 06/07/2018 14:02    Medications: I have reviewed the patient's current medications.  Assessment: Pancreatitis with elevated leukocytosis, WBC 24.4 from 28.3, after 2.5 L of IV fluid bolus and IV fluids at 250 mL/h, Hb 13.3 from  15.3 Improvement in renal function(Cr 1.26-0.98, GFR 52 to >60), mild acidosis Minimal elevation in LFTs bili 1.3/AST 43/ALT 65/ALP 64 Improvement in lactic acidosis, 1.2 today, normal procalcitonin  Etiology of pancreatitis unclear, normal ANA and IgG subclass 4 Ultrasound did not show evidence of cholelithiasis  Circumferential thickening of distal esophagus Compression fracture of L1 New onset atrial fibrillation  Plan: Decrease IV fluid to 125 mL/h Start clear liquid diet Recommend early ambulation, out of bed to chair, walk in hallways Empirically started on IV cefepime and IV Flagyl  If abdominal pain does not improve/renal function worsens/leucocytosis worsens, will need to consider repeat imaging to rule out complications such as pancreatic cyst/necrosis development.  Will need EGD at one point due to abnormal distal esophageal thickening noted on CAT scan  Ronnette Juniper 06/08/2018, 11:15 AM   Pager [phone] If no answer or after 5 PM call [phone]

## 2018-06-08 NOTE — Progress Notes (Signed)
Metoprolol 5mg  IV given per orders. HR decreased into the 100s/110s for a short time but now has returned into the 120s/130s. NP on call paged. Will continue to monitor and carry out new orders.

## 2018-06-08 NOTE — Progress Notes (Signed)
CCMD reported to this RN that patient had run of Vtach and had converted to a-fib with rvr at 0232. EKG obtained and confirmed A-fib with rvr. VSS at this time. C. Bodenheimer made aware of acute changes. Will continue to monitor and carry out any new orders. Carnella [place] I

## 2018-06-09 ENCOUNTER — Inpatient Hospital Stay (HOSPITAL_COMMUNITY): Payer: Medicare Other

## 2018-06-09 DIAGNOSIS — R000 Tachycardia, unspecified: Secondary | ICD-10-CM

## 2018-06-09 DIAGNOSIS — K5900 Constipation, unspecified: Secondary | ICD-10-CM

## 2018-06-09 LAB — COMPREHENSIVE METABOLIC PANEL
ALT: 32 U/L (ref 0–44)
AST: 29 U/L (ref 15–41)
Albumin: 2.8 g/dL — ABNORMAL LOW (ref 3.5–5.0)
Alkaline Phosphatase: 53 U/L (ref 38–126)
Anion gap: 10 (ref 5–15)
BUN: 14 mg/dL (ref 8–23)
CALCIUM: 7 mg/dL — AB (ref 8.9–10.3)
CO2: 22 mmol/L (ref 22–32)
Chloride: 100 mmol/L (ref 98–111)
Creatinine, Ser: 0.81 mg/dL (ref 0.61–1.24)
GFR calc Af Amer: >60 mL/min (ref >60)
GFR calc non Af Amer: >60 mL/min (ref >60)
Glucose, Bld: 125 mg/dL — ABNORMAL HIGH (ref 70–99)
Potassium: 2.7 mmol/L — CL (ref 3.5–5.1)
Sodium: 132 mmol/L — ABNORMAL LOW (ref 135–145)
Total Bilirubin: 1.1 mg/dL (ref 0.3–1.2)
Total Protein: 5.7 g/dL — ABNORMAL LOW (ref 6.5–8.1)

## 2018-06-09 LAB — CBC WITH DIFFERENTIAL/PLATELET
Abs Immature Granulocytes: 0.32 10*3/uL — ABNORMAL HIGH (ref 0.00–0.07)
Basophils Absolute: 0 10*3/uL (ref 0.0–0.1)
Basophils Relative: 0 %
Eosinophils Absolute: 0 10*3/uL (ref 0.0–0.5)
Eosinophils Relative: 0 %
HEMATOCRIT: 38 % — AB (ref 39.0–52.0)
Hemoglobin: 12.5 g/dL — ABNORMAL LOW (ref 13.0–17.0)
Immature Granulocytes: 2 %
LYMPHS ABS: 0.7 10*3/uL (ref 0.7–4.0)
Lymphocytes Relative: 4 %
MCH: 29.1 pg (ref 26.0–34.0)
MCHC: 32.9 g/dL (ref 30.0–36.0)
MCV: 88.6 fL (ref 80.0–100.0)
MONO ABS: 0.9 10*3/uL (ref 0.1–1.0)
Monocytes Relative: 5 %
Neutro Abs: 18.1 10*3/uL — ABNORMAL HIGH (ref 1.7–7.7)
Neutrophils Relative %: 89 %
Platelets: 178 10*3/uL (ref 150–400)
RBC: 4.29 MIL/uL (ref 4.22–5.81)
RDW: 13.1 % (ref 11.5–15.5)
WBC: 20.1 10*3/uL — ABNORMAL HIGH (ref 4.0–10.5)
nRBC: 0 % (ref 0.0–0.2)

## 2018-06-09 LAB — BASIC METABOLIC PANEL
Anion gap: 8 (ref 5–15)
BUN: 15 mg/dL (ref 8–23)
CO2: 22 mmol/L (ref 22–32)
Calcium: 7.1 mg/dL — ABNORMAL LOW (ref 8.9–10.3)
Chloride: 98 mmol/L (ref 98–111)
Creatinine, Ser: 0.89 mg/dL (ref 0.61–1.24)
GFR calc Af Amer: >60 mL/min (ref >60)
GFR calc non Af Amer: >60 mL/min (ref >60)
Glucose, Bld: 182 mg/dL — ABNORMAL HIGH (ref 70–99)
Potassium: 3.2 mmol/L — ABNORMAL LOW (ref 3.5–5.1)
Sodium: 128 mmol/L — ABNORMAL LOW (ref 135–145)

## 2018-06-09 MED ORDER — ALBUTEROL SULFATE HFA 108 (90 BASE) MCG/ACT IN AERS: 2.0000 | INHALATION_SPRAY | RESPIRATORY_TRACT | Status: DC | PRN

## 2018-06-09 MED ORDER — LACTULOSE 10 GM/15ML PO SOLN
10.0000 g | Freq: Three times a day (TID) | ORAL | Status: DC
  Administered 2018-06-09 – 2018-06-14 (×14): 10 g via ORAL
  Filled 2018-06-09 (×17): qty 15

## 2018-06-09 MED ORDER — BISACODYL 10 MG RE SUPP
10.0000 mg | Freq: Once | RECTAL | Status: DC
  Filled 2018-06-09 (×2): qty 1

## 2018-06-09 MED ORDER — NON FORMULARY: Freq: Every morning | Status: DC

## 2018-06-09 MED ORDER — DILTIAZEM HCL 90 MG PO TABS
90.0000 mg | ORAL_TABLET | Freq: Four times a day (QID) | ORAL | Status: DC
  Administered 2018-06-09 – 2018-06-15 (×23): 90 mg via ORAL
  Filled 2018-06-09 (×23): qty 1

## 2018-06-09 MED ORDER — IPRATROPIUM-ALBUTEROL 0.5-2.5 (3) MG/3ML IN SOLN
3.0000 mL | Freq: Four times a day (QID) | RESPIRATORY_TRACT | Status: DC | PRN
  Administered 2018-06-11 – 2018-06-12 (×2): 3 mL via RESPIRATORY_TRACT
  Filled 2018-06-09 (×2): qty 3

## 2018-06-09 MED ORDER — DILTIAZEM HCL 60 MG PO TABS
60.0000 mg | ORAL_TABLET | Freq: Four times a day (QID) | ORAL | Status: DC
  Administered 2018-06-09: 60 mg via ORAL
  Filled 2018-06-09: qty 1

## 2018-06-09 MED ORDER — FUROSEMIDE 10 MG/ML IJ SOLN
40.0000 mg | Freq: Once | INTRAMUSCULAR | Status: AC
  Administered 2018-06-09: 40 mg via INTRAVENOUS
  Filled 2018-06-09: qty 4

## 2018-06-09 MED ORDER — NON FORMULARY: 10.0000 mg | Freq: Every morning | Status: DC

## 2018-06-09 MED ORDER — CETIRIZINE HCL 10 MG PO TABS
10.0000 mg | ORAL_TABLET | Freq: Every day | ORAL | Status: DC
  Administered 2018-06-09 – 2018-06-14 (×6): 10 mg via ORAL
  Filled 2018-06-09 (×7): qty 1

## 2018-06-09 MED ORDER — POTASSIUM CHLORIDE 10 MEQ/100ML IV SOLN
10.0000 meq | INTRAVENOUS | Status: AC
  Administered 2018-06-09 (×4): 10 meq via INTRAVENOUS
  Filled 2018-06-09 (×4): qty 100

## 2018-06-09 MED ORDER — ALBUTEROL SULFATE (2.5 MG/3ML) 0.083% IN NEBU
2.5000 mg | INHALATION_SOLUTION | RESPIRATORY_TRACT | Status: DC | PRN
  Administered 2018-06-09 – 2018-06-10 (×3): 2.5 mg via RESPIRATORY_TRACT
  Filled 2018-06-09 (×3): qty 3

## 2018-06-09 NOTE — Progress Notes (Signed)
PROGRESS NOTE  Timothy Khan HCW:[id number] DOB: Aug 05, 1931 DOA: 06/06/2018 PCP: [name], MD  Brief History   The patient is a 85 yr old man who carries a past medical history significant for BPH, Bilateral foot drop, gait disturbance, GERD, hyperlipdemia, hypertension, peripheral neuropathy, and history of cuncussion, skull fracture and abnormal MRI brain.  The patient presented to [facility] on 06/06/2018 with a complaint of severe epigastric abdominal pain, nausea and vomiting early that morning. He stated that the pain was sharp. 10/10 in severity. He states that he had multiple episodes of emesis and then dry heaves. He denies constipation of diarrhea. He denies fevers or chills. He has had no sick contact. He has had no cough, shortness of breath, or wheezes, He does not drink alcohol. He has never had anything like this in the past.  In the ED the patient was febrile, with a high blood pressure. His lipase was 5122 with a mildly elevated ALT. Remainder of LFT's were unremarkable. CT abdomen and pelvis revealed inflammatory changes in the peripancreatic fat. There are organized fluid collections surrounding the pancreas as well. This is no biliary ductal dilatation. No stones seen in gallbladder. It also demonstrates circumferential thicking of the distal esophagus. And an old compression fracture of L1.  GI was consulted and the patient underwent a right upper quadrant ultrasound that demonstrated no evidence of cholelithiasis.  On 06/07/2018 the patient had lactic acidosis at 2.3 and an increase in his WBC count to 28. He was started on IV cefepime and the sepsis protocol. WBC is down and lactic acid is 1.2 on 06/08/2018.  The patient stated that his abdominal pain is worse this morning. More distended. CT of the abdomen yesterday demonstrated increase in inflammatory changes of the pancreas.  Consultants  . Gastroenterology  Procedures  . None  Antibiotics  . Cefepime   Interval History/Subjective  The patient is a 85 yr old man who carries a past medical history significant for BPH, Bilateral foot drop, gait disturbance, GERD, hyperlipdemia, hypertension, peripheral neuropathy, and history of cuncussion, skull fracture and abnormal MRI brain.  The patient presented to [facility] on 06/06/2018 with a complaint of severe epigastric abdominal pain, nausea and vomiting early that morning. He stated that the pain was sharp. 10/10 in severity. He states that he had multiple episodes of emesis and then dry heaves. He denies constipation of diarrhea. He denies fevers or chills. He has had no sick contact. He has had no cough, shortness of breath, or wheezes, He does not drink alcohol. He has never had anything like this in the past.  In the ED the patient was febrile, with a high blood pressure. His lipase was 5122 with a mildly elevated ALT. Remainder of LFT's were unremarkable. CT abdomen and pelvis revealed inflammatory changes in the peripancreatic fat. There are organized fluid collections surrounding the pancreas as well. This is no biliary ductal dilatation. No stones seen in gallbladder. It also demonstrates circumferential thicking of the distal esophagus. And an old compression fracture of L1.  GI was consulted and the patient underwent a right upper quadrant ultrasound that demonstrated no evidence of cholecystitis.  On 06/07/2018 the patient had lactic acidosis at 2.3 and an increase in his WBC count to 28. He was started on IV cefepime and the sepsis protocol. WBC is down and lactic acid is 1.2 on 06/08/2018.  The patient is to continue clear liquids per GI. He states that he has had no BM x 5 days.  06/07/2018 the patient had runs of V. Tach and atrial fibrillation this morning. This seems to have resolved with metoprolol 5 mg x1. This patient's atrial fibrillation converted to sinus without intervention.  Objective   Vitals:  Vitals:   06/08/18 2131 06/09/18 0630   BP: (!) 188/82 (!) 173/85  Pulse: 90 95  Resp: 20 18  Temp: 99 F (37.2 C) 99.2 F (37.3 C)  SpO2: 96% 95%    Exam:  Constitutional:  . The patient is awake, alert, and oriented x 3. No acute distress. Respiratory:  . No increased work of breathing.  . No wheezes, rales, or rhonchi. . No tactile fremitus. Cardiovascular:  . Tachycardic with rate in the 100's.. No murmurs, ectopy or gallups. . No LE extremity edema   . Normal pedal pulses Abdomen:  . Abdomen is mildly distended. It is tender especially in the epigastrum and less so diffusely. Less tender than yesterday. . No hernias, masses, or organomegaly are appreciated. . Normoactive bowel sounds. Musculoskeletal:  . No cyanosis, clubbing or edema Skin:  . No rashes, lesions, ulcers . palpation of skin: no induration or nodules . Warm, dry, and intact Neurologic:  . CN 2-12 intact . Patient is moving all extremities. Psychiatric:  . Mental status o Mood, affect appropriate o Orientation to person, place, time  . judgment and insight appear intact     I have personally reviewed the following:   Today's Data  . CBC, CMP, Vitals, Lactic Acid, EKG.  Imaging  . None   Scheduled Meds: . cetirizine  10 mg Oral Daily  . diltiazem  60 mg Oral Q6H  . enoxaparin (LOVENOX) injection  40 mg Subcutaneous Q24H  . fluticasone  2 spray Each Nare QHS  . gabapentin  600 mg Oral QHS  . mouth rinse  15 mL Mouth Rinse BID  . pantoprazole (PROTONIX) IV  40 mg Intravenous Q24H  . tamsulosin  0.4 mg Oral QHS   Continuous Infusions: . ceFEPime (MAXIPIME) IV 2 g (06/09/18 4098)  . lactated ringers 100 mL/hr at 06/09/18 1144  . metronidazole 500 mg (06/09/18 0811)  . potassium chloride 10 mEq (06/09/18 1143)    Principal Problem:   Acute pancreatitis Active Problems:   Hereditary and idiopathic peripheral neuropathy   Hypertension   Foot drop, bilateral   BPH (benign prostatic hyperplasia)   LBBB (left bundle branch  block)   GERD (gastroesophageal reflux disease)   Esophageal thickening   Hiatal hernia   A & P  Sepsis: Resolving. Tachycardia, low grade temperature, WBC 28.3, Lactic acid of 2.4. Blood cultures x 2 obtained. Patient started on cefepime. Sepsis protocol initiated. Today WBC down to 24.4 and lactic acid down to 1.2. Pt remains tachycardic, but afebrile. Monitor.  Acute pancreatitis: Increase in pain, distention and inflammatory changes of pancreas. Pain control, IV fluids, anti-emetics. Patient was taking HCTZ at home. Perhaps is a cause. He is not a drinker. TGL is 166. WBC up from 12.1 to 28.2 initially. Now 20.1.  Cefepime and Flagyl started. No family history. Thus far gallbladder and biliary tree appear benign. Further work up as per GI recommendations. Pt has been continued on a clear liquid diet.   Esophageal thickening: GI plans on EGD prior to discharge. Concerning for malignancy, although the patient does have GERD.  Tachycardia/Arrhythmia: The patient has had no further arrhythmias since 06/08/2018. Will increase dose of diltiazem to help with heart rate and blood pressure.  Wheeze: CXR demonstrates atelectasis, low ung volumes  and small pleural effusions. Decrease IV fluid rate. Pt will receive albuterol nebs as needed. I will also order incentive spirometry.  New LBBB and prolonged PR interval since last EKG in system from 04/2013: Echocardiogram pending. Troponin negative.  Hypertension: Elevated at 155/99. Possibly due to pain or the fact that his cozaar has not yet been restarted. He is receiving prn hydralazine. Will continue that for now until it is clear that he is able to tolerate PO. HCTZ stopped as it is a possible cause of pancreatitis. Increase diltiazem.  Constipation: No BM x 5 days per patient. Last BM recorded by Nursing was 06/03/2018. Will give bisacodyl suppository and lactulose. Monitor. No signs of obstruction on CT.  BPH: Continue Flomax as at home. Monitor.   GERD: continue protonix  Hiatal Hernia: Noted. Monitor  DVT prophylaxis: Lovenox Code Status: Full Code Family Communication: None present Disposition Plan: Home  Delainie Chavana, DO [facility] Direct contact: see [email or web address]  7PM-7AM contact night coverage as above   LOS: 3 days

## 2018-06-09 NOTE — Progress Notes (Signed)
CRITICAL VALUE ALERT  Critical Value:  K 2.7  Date & Time Notied:  06/09/2018 0740 Provider Notified: dr. Benny Lennert   Orders Received/Actions taken: awaiting page

## 2018-06-09 NOTE — Plan of Care (Signed)

## 2018-06-09 NOTE — Progress Notes (Addendum)
Subjective: The patient was seen and examined at bedside. He was able to get out of bed to chair and walk around in the hallways yesterday. Reports improvement in abdominal pain with pain medications. Denies nausea or vomiting.  Objective: Vital signs in last 24 hours: Temp:  [98.4 F (36.9 C)-99.2 F (37.3 C)] 99.2 F (37.3 C) (03/22 0630) Pulse Rate:  [90-96] 95 (03/22 0630) Resp:  [18-24] 18 (03/22 0630) BP: (155-188)/(74-85) 173/85 (03/22 0630) SpO2:  [95 %-96 %] 95 % (03/22 0630) Weight change:  Last BM Date: 06/03/18  PE: On oxygen via nasal cannula, not in respiratory distress, speaking full sentences GENERAL: No pallor, no icterus  ABDOMEN: Distended, mild generalized tenderness, hypoactive bowel sounds EXTREMITIES: Minimal pitting edema  Lab Results: Results for orders placed or performed during the hospital encounter of 06/06/18 (from the past 48 hour(s))  Lactic acid, plasma     Status: Abnormal   Collection Time: 06/07/18  3:32 PM  Result Value Ref Range   Lactic Acid, Venous 2.1 (HH) 0.5 - 1.9 mmol/L    Comment: CRITICAL RESULT CALLED TO, READ BACK BY AND VERIFIED WITH: CLARK,L RN @1621  ON 06/07/2018 JACKSON,K Performed at [facility], [place] [address], [place], [place] [address]   Culture, blood (x 2)     Status: None (Preliminary result)   Collection Time: 06/07/18  5:09 PM  Result Value Ref Range   Specimen Description      BLOOD LEFT ANTECUBITAL Performed at [facility] [address], [place], [place] [address]    Special Requests      BOTTLES DRAWN AEROBIC ONLY Blood Culture adequate volume Performed at [facility] [address], [place], [place] [address]    Culture      NO GROWTH 2 DAYS Performed at [facility], [place] [address], [place], [place] [address]    Report Status PENDING   CBC with Differential     Status: Abnormal   Collection Time: 06/07/18  5:09 PM  Result Value Ref  Range   WBC 28.3 (H) 4.0 - 10.5 K/uL    Comment: WHITE COUNT CONFIRMED ON SMEAR   RBC 5.12 4.22 - 5.81 MIL/uL   Hemoglobin 14.8 13.0 - 17.0 g/dL   HCT 47.3 39.0 - 52.0 %   MCV 92.4 80.0 - 100.0 fL   MCH 28.9 26.0 - 34.0 pg   MCHC 31.3 30.0 - 36.0 g/dL   RDW 13.2 11.5 - 15.5 %   Platelets 231 150 - 400 K/uL   nRBC 0.0 0.0 - 0.2 %   Neutrophils Relative % 91 %   Neutro Abs 25.9 (H) 1.7 - 7.7 K/uL   Lymphocytes Relative 4 %   Lymphs Abs 1.0 0.7 - 4.0 K/uL   Monocytes Relative 4 %   Monocytes Absolute 1.1 (H) 0.1 - 1.0 K/uL   Eosinophils Relative 0 %   Eosinophils Absolute 0.1 0.0 - 0.5 K/uL   Basophils Relative 0 %   Basophils Absolute 0.0 0.0 - 0.1 K/uL   Immature Granulocytes 1 %   Abs Immature Granulocytes 0.28 (H) 0.00 - 0.07 K/uL    Comment: Performed at [facility], [place] [address], [place], [place] [address]  Comprehensive metabolic panel     Status: Abnormal   Collection Time: 06/07/18  5:09 PM  Result Value Ref Range   Sodium 136 135 - 145 mmol/L   Potassium 4.1 3.5 - 5.1 mmol/L   Chloride 104 98 -  111 mmol/L   CO2 23 22 - 32 mmol/L   Glucose, Bld 142 (H) 70 - 99 mg/dL   BUN 18 8 - 23 mg/dL   Creatinine, Ser 1.26 (H) 0.61 - 1.24 mg/dL   Calcium 7.3 (L) 8.9 - 10.3 mg/dL   Total Protein 6.3 (L) 6.5 - 8.1 g/dL   Albumin 3.2 (L) 3.5 - 5.0 g/dL   AST 47 (H) 15 - 41 U/L   ALT 65 (H) 0 - 44 U/L   Alkaline Phosphatase 64 38 - 126 U/L   Total Bilirubin 1.3 (H) 0.3 - 1.2 mg/dL   GFR calc non Af Amer 52 (L) >60 mL/min   GFR calc Af Amer >60 >60 mL/min   Anion gap 9 5 - 15    Comment: Performed at [facility], [place] [address], [place], [place] [address]  Procalcitonin     Status: None   Collection Time: 06/07/18  5:09 PM  Result Value Ref Range   Procalcitonin 0.35 ng/mL    Comment:        Interpretation: PCT (Procalcitonin) <= 0.5 ng/mL: Systemic infection (sepsis) is not likely. Local bacterial infection is possible. (NOTE)        Sepsis PCT Algorithm           Lower Respiratory Tract                                      Infection PCT Algorithm    ----------------------------     ----------------------------         PCT < 0.25 ng/mL                PCT < 0.10 ng/mL         Strongly encourage             Strongly discourage   discontinuation of antibiotics    initiation of antibiotics    ----------------------------     -----------------------------       PCT 0.25 - 0.50 ng/mL            PCT 0.10 - 0.25 ng/mL               OR       >80% decrease in PCT            Discourage initiation of                                            antibiotics      Encourage discontinuation           of antibiotics    ----------------------------     -----------------------------         PCT >= 0.50 ng/mL              PCT 0.26 - 0.50 ng/mL               AND        <80% decrease in PCT             Encourage initiation of  antibiotics       Encourage continuation           of antibiotics    ----------------------------     -----------------------------        PCT >= 0.50 ng/mL                  PCT > 0.50 ng/mL               AND         increase in PCT                  Strongly encourage                                      initiation of antibiotics    Strongly encourage escalation           of antibiotics                                     -----------------------------                                           PCT <= 0.25 ng/mL                                                 OR                                        > 80% decrease in PCT                                     Discontinue / Do not initiate                                             antibiotics Performed at [facility] [address] [place], [place] [address]   Protime-INR     Status: Abnormal   Collection Time: 06/07/18  5:09 PM  Result Value Ref Range   Prothrombin Time 15.3 (H) 11.4 - 15.2  seconds   INR 1.2 0.8 - 1.2    Comment: (NOTE) INR goal varies based on device and disease states. Performed at [facility], [place] [address] [place], [place] [address]   APTT     Status: None   Collection Time: 06/07/18  5:09 PM  Result Value Ref Range   aPTT 30 24 - 36 seconds    Comment: Performed at [facility], [place] [address] [place], [place] [address]  Lactic acid, plasma     Status: Abnormal   Collection Time: 06/07/18  5:09 PM  Result Value Ref Range   Lactic Acid, Venous 2.4 (HH) 0.5 - 1.9 mmol/L    Comment: CRITICAL RESULT CALLED TO, READ BACK BY AND  VERIFIED WITH: HUFF,J RN @1813  ON 06/07/2018 JACKSON,K Performed at [facility], [place] [address] [place], [place] [address]   Culture, blood (x 2)     Status: None (Preliminary result)   Collection Time: 06/07/18  5:14 PM  Result Value Ref Range   Specimen Description      BLOOD LEFT FOREARM Performed at [facility] [address], [place], [place] [address]    Special Requests      BOTTLES DRAWN AEROBIC AND ANAEROBIC Blood Culture adequate volume Performed at [facility] [address], [place], [place] [address]    Culture      NO GROWTH 2 DAYS Performed at [facility], [place] [address], [place], [place] [address]    Report Status PENDING   Lactic acid, plasma     Status: Abnormal   Collection Time: 06/07/18  7:35 PM  Result Value Ref Range   Lactic Acid, Venous 2.3 (HH) 0.5 - 1.9 mmol/L    Comment: CRITICAL RESULT CALLED TO, READ BACK BY AND VERIFIED WITH: ASARO,J RN @2021  ON 06/07/2018 JACKSON,K Performed at [facility], [place] [address], [place], [place] [address]   CBC with Differential/Platelet     Status: Abnormal   Collection Time: 06/08/18  5:09 AM  Result Value Ref Range   WBC 24.4 (H) 4.0 - 10.5 K/uL    Comment: WHITE COUNT CONFIRMED ON SMEAR   RBC 4.59 4.22 - 5.81 MIL/uL    Hemoglobin 13.3 13.0 - 17.0 g/dL   HCT 42.5 39.0 - 52.0 %   MCV 92.6 80.0 - 100.0 fL   MCH 29.0 26.0 - 34.0 pg   MCHC 31.3 30.0 - 36.0 g/dL   RDW 13.2 11.5 - 15.5 %   Platelets 205 150 - 400 K/uL   nRBC 0.0 0.0 - 0.2 %   Neutrophils Relative % 90 %   Neutro Abs 22.0 (H) 1.7 - 7.7 K/uL   Lymphocytes Relative 4 %   Lymphs Abs 0.9 0.7 - 4.0 K/uL   Monocytes Relative 4 %   Monocytes Absolute 1.0 0.1 - 1.0 K/uL   Eosinophils Relative 0 %   Eosinophils Absolute 0.1 0.0 - 0.5 K/uL   Basophils Relative 0 %   Basophils Absolute 0.0 0.0 - 0.1 K/uL   WBC Morphology DOHLE BODIES    Immature Granulocytes 2 %   Abs Immature Granulocytes 0.38 (H) 0.00 - 0.07 K/uL    Comment: Performed at [facility], [place] [address], [place], [place] [address]  Basic metabolic panel     Status: Abnormal   Collection Time: 06/08/18  5:09 AM  Result Value Ref Range   Sodium 136 135 - 145 mmol/L   Potassium 3.5 3.5 - 5.1 mmol/L   Chloride 109 98 - 111 mmol/L   CO2 21 (L) 22 - 32 mmol/L   Glucose, Bld 130 (H) 70 - 99 mg/dL   BUN 16 8 - 23 mg/dL   Creatinine, Ser 0.98 0.61 - 1.24 mg/dL   Calcium 6.8 (L) 8.9 - 10.3 mg/dL   GFR calc non Af Amer >60 >60 mL/min   GFR calc Af Amer >60 >60 mL/min   Anion gap 6 5 - 15    Comment: Performed at [facility], [place] [address], [place], [place] [address]  Lactic acid, plasma     Status: None   Collection Time: 06/08/18  8:27 AM  Result Value Ref Range   Lactic  Acid, Venous 1.2 0.5 - 1.9 mmol/L    Comment: Performed at [facility], [place] [address] [place], [place] [address]  Lactic acid, plasma     Status: None   Collection Time: 06/08/18 11:31 AM  Result Value Ref Range   Lactic Acid, Venous 1.4 0.5 - 1.9 mmol/L    Comment: Performed at [facility], [place] [address] [place], [place] [address]  CBC with Differential/Platelet     Status: Abnormal   Collection Time: 06/09/18  5:26 AM  Result  Value Ref Range   WBC 20.1 (H) 4.0 - 10.5 K/uL    Comment: WHITE COUNT CONFIRMED ON SMEAR   RBC 4.29 4.22 - 5.81 MIL/uL   Hemoglobin 12.5 (L) 13.0 - 17.0 g/dL   HCT 38.0 (L) 39.0 - 52.0 %   MCV 88.6 80.0 - 100.0 fL   MCH 29.1 26.0 - 34.0 pg   MCHC 32.9 30.0 - 36.0 g/dL   RDW 13.1 11.5 - 15.5 %   Platelets 178 150 - 400 K/uL   nRBC 0.0 0.0 - 0.2 %   Neutrophils Relative % 89 %   Neutro Abs 18.1 (H) 1.7 - 7.7 K/uL   Lymphocytes Relative 4 %   Lymphs Abs 0.7 0.7 - 4.0 K/uL   Monocytes Relative 5 %   Monocytes Absolute 0.9 0.1 - 1.0 K/uL   Eosinophils Relative 0 %   Eosinophils Absolute 0.0 0.0 - 0.5 K/uL   Basophils Relative 0 %   Basophils Absolute 0.0 0.0 - 0.1 K/uL   Immature Granulocytes 2 %   Abs Immature Granulocytes 0.32 (H) 0.00 - 0.07 K/uL    Comment: Performed at [facility], [place] [address], [place], [place] [address]  Comprehensive metabolic panel     Status: Abnormal   Collection Time: 06/09/18  5:26 AM  Result Value Ref Range   Sodium 132 (L) 135 - 145 mmol/L   Potassium 2.7 (LL) 3.5 - 5.1 mmol/L    Comment: CRITICAL RESULT CALLED TO, READ BACK BY AND VERIFIED WITH: A PINNIX,RN 06/09/18 0733 RHOLMES    Chloride 100 98 - 111 mmol/L   CO2 22 22 - 32 mmol/L   Glucose, Bld 125 (H) 70 - 99 mg/dL   BUN 14 8 - 23 mg/dL   Creatinine, Ser 0.81 0.61 - 1.24 mg/dL   Calcium 7.0 (L) 8.9 - 10.3 mg/dL   Total Protein 5.7 (L) 6.5 - 8.1 g/dL   Albumin 2.8 (L) 3.5 - 5.0 g/dL   AST 29 15 - 41 U/L   ALT 32 0 - 44 U/L   Alkaline Phosphatase 53 38 - 126 U/L   Total Bilirubin 1.1 0.3 - 1.2 mg/dL   GFR calc non Af Amer >60 >60 mL/min   GFR calc Af Amer >60 >60 mL/min   Anion gap 10 5 - 15    Comment: Performed at [facility], [place] [address] [place], [place] [address]    Studies/Results: Ct Abdomen Pelvis W Contrast  Result Date: 06/08/2018 CLINICAL DATA:  Abdominal pain. Bowel obstruction. EXAM: CT ABDOMEN AND PELVIS WITH CONTRAST  TECHNIQUE: Multidetector CT imaging of the abdomen and pelvis was performed using the standard protocol following bolus administration of intravenous contrast. CONTRAST:  130mL OMNIPAQUE IOHEXOL 300 MG/ML  SOLN COMPARISON:  06/06/2018 FINDINGS: Lower chest: Bilateral pleural effusions. Bilateral lower lobe subsegmental atelectasis. Hepatobiliary: Multiple liver cysts. Normal distention of the gallbladder. Pancreas: Increasing inflammatory changes of the head of the  pancreas with diffuse edema, peripancreatic fat stranding and layering fluid. No evidence of pseudocyst formation. Spleen: Normal in size without focal abnormality. Adrenals/Urinary Tract: Adrenal glands are unremarkable. Kidneys are normal, without renal calculi, focal lesion, or hydronephrosis. Bladder is unremarkable. Stomach/Bowel: Small hiatal hernia. Dilation of the distal esophagus. No evidence of small-bowel obstruction. Scattered colonic diverticulosis. No evidence of acute diverticulitis. Vascular/Lymphatic: Aortic atherosclerosis. No enlarged abdominal or pelvic lymph nodes. Reactive peripancreatic lymph nodes. Reproductive: Enlargement of the prostate gland. Other: Small volume abdominopelvic ascites. Musculoskeletal: Chronic compression fracture of L1 vertebral body with approximately 40% height loss. IMPRESSION: 1. Worsening inflammatory changes of the head of the pancreas with diffuse parenchymal edema, peripancreatic fat stranding and layering fluid. No evidence of pseudocyst formation. 2. Small volume abdominopelvic ascites. 3. Persistent thickening of the distal esophagus. 4. Scattered colonic diverticulosis without evidence of acute diverticulitis. 5. Enlarged prostate gland. Please correlate to serum PSA values. 6. Chronic L1 compression fracture. Electronically Signed   By: Fidela Salisbury M.D.   On: 06/08/2018 22:22   Dg Chest Port 1 View  Result Date: 06/09/2018 CLINICAL DATA:  SOB, weakness, wheezing EXAM: PORTABLE CHEST -  1 VIEW COMPARISON:  06/07/2018 FINDINGS: Relatively low lung volumes as before. Worsening consolidation/atelectasis in the lung bases. Probable small pleural effusions right greater than left. Heart size upper limits normal for technique. No pneumothorax. Old healed left sixth and seventh rib fractures. IMPRESSION: Low volumes with worsening bibasilar atelectasis/consolidation and small effusions. Electronically Signed   By: Lucrezia Europe M.D.   On: 06/09/2018 10:29   Dg Chest Port 1 View  Result Date: 06/07/2018 CLINICAL DATA:  Sepsis. EXAM: PORTABLE CHEST 1 VIEW COMPARISON:  PA and lateral chest 03/19/2018 11/18/2009. FINDINGS: Lung volumes are much lower than on the comparison examinations. Trace left pleural effusion and basilar atelectasis are unchanged since the most recent study. Right lung is clear. No pneumothorax. Remote left sixth and seventh rib fractures noted. No acute bony abnormality. IMPRESSION: Trace left pleural effusion and subsegmental atelectasis left lung base are unchanged since the most recent study. Electronically Signed   By: Inge Rise M.D.   On: 06/07/2018 17:37   US Abdomen Limited Ruq  Result Date: 06/07/2018 CLINICAL DATA:  Pancreatitis. EXAM: ULTRASOUND ABDOMEN LIMITED RIGHT UPPER QUADRANT COMPARISON:  CT 06/06/2018 FINDINGS: Gallbladder: No evidence of gallstones or sludge. No significant gallbladder wall thickening. Negative sonographic Murphy sign. No adjacent free fluid. Common bile duct: Diameter: 2.6 mm. Liver: Mild increased parenchymal echogenicity compatible with steatosis with possible focal fatty sparing centrally. Tiny amount of perihepatic fluid. Several liver cysts as seen on recent CT. Portal vein is patent on color Doppler imaging with normal direction of blood flow towards the liver. IMPRESSION: No evidence of cholelithiasis. Mild hepatic steatosis with multiple cysts unchanged from recent CT. Tiny amount of perihepatic fluid. Electronically Signed   By:  Marin Olp M.D.   On: 06/07/2018 14:02    Medications: I have reviewed the patient's current medications.  Assessment: Acute pancreatitis Unknown etiology(no gallstones, no history of alcohol use, triglycerides 166, normal IgG subclasses and ANA)  Improvement in leukocytosis(empirically treated on IV cefepime) and hemoconcentration, improvement in renal function  Hypokalemia  CT abdomen and pelvis performed with contrast for abdominal pain from 06/08/2018 showed increasing inflammatory changes of head of pancreas with diffuse edema, peripancreatic fat stranding and layering fluid, no evidence of pseudocyst formation  Low volumes with worsening bibasilar atelectasis/consolidation and small effusions noted on chest x-ray from today morning  Distal  esophageal thickening, dilatation of distal esophagus  Chronic L1 compression fracture  Plan: Decrease Ringer's lactate to 100 mL/h Potassium supplementation Resume clear liquid diet, no evidence of bowel obstruction noted on CT Continue Protonix IV every 24 hours Will need EGD prior to discharge for persistently noted distal esophageal thickening    Ronnette Juniper 06/09/2018, 11:33 AM   Pager [phone] If no answer or after 5 PM call [phone]

## 2018-06-10 ENCOUNTER — Ambulatory Visit: Payer: Medicare Other | Admitting: Physical Therapy

## 2018-06-10 DIAGNOSIS — R0902 Hypoxemia: Secondary | ICD-10-CM

## 2018-06-10 DIAGNOSIS — J9811 Atelectasis: Secondary | ICD-10-CM

## 2018-06-10 MED ORDER — POLYETHYLENE GLYCOL 3350 17 G PO PACK
17.0000 g | PACK | Freq: Two times a day (BID) | ORAL | Status: DC
  Administered 2018-06-10 – 2018-06-14 (×7): 17 g via ORAL
  Filled 2018-06-10 (×11): qty 1

## 2018-06-10 MED ORDER — ALBUTEROL SULFATE (2.5 MG/3ML) 0.083% IN NEBU
2.5000 mg | INHALATION_SOLUTION | RESPIRATORY_TRACT | Status: DC | PRN
  Administered 2018-06-10: 2.5 mg via RESPIRATORY_TRACT
  Filled 2018-06-10: qty 3

## 2018-06-10 MED ORDER — POTASSIUM CHLORIDE CRYS ER 20 MEQ PO TBCR
40.0000 meq | EXTENDED_RELEASE_TABLET | ORAL | Status: AC
  Administered 2018-06-10 (×2): 40 meq via ORAL
  Filled 2018-06-10 (×2): qty 2

## 2018-06-10 MED ORDER — HYDROMORPHONE HCL 1 MG/ML IJ SOLN
0.5000 mg | INTRAMUSCULAR | Status: DC | PRN
  Administered 2018-06-10: 0.5 mg via INTRAVENOUS
  Administered 2018-06-10: 1 mg via INTRAVENOUS
  Filled 2018-06-10 (×4): qty 1

## 2018-06-10 NOTE — Progress Notes (Signed)
Pharmacy Antibiotic Note  Timothy Khan is a 85 y.o. male admitted on 06/06/2018 with pancreatitis.  Pharmacy has been consulted for Cefepime dosing for sepsis.  Today 06/10/2018: Day #4 Abx Afebrile, continued tachycardia but improved WBC trending down SCr 0.89, CrCl 64 ml/min (improved) Lactate trending down 3/20 BCx: NG x 2 days  Plan: Continue cefepime 2g IV q12h; although renal function improved, given clinical improvement, will keep at same dose Flagyl per MD Follow up renal function & cultures  Height: 5\' 10"  (177.8 cm) Weight: 171 lb 4.8 oz (77.7 kg) IBW/kg (Calculated) : 73  Temp (24hrs), Avg:99.1 F (37.3 C), Min:98.9 F (37.2 C), Max:99.4 F (37.4 C)  Recent Labs  Lab 06/06/18 0905 06/07/18 0428 06/07/18 1532 06/07/18 1709 06/07/18 1935 06/08/18 0509 06/08/18 0827 06/08/18 1131 06/09/18 0526 06/09/18 1523  WBC 12.1* 28.2*  --  28.3*  --  24.4*  --   --  20.1*  --   CREATININE 1.19 1.27*  --  1.26*  --  0.98  --   --  0.81 0.89  LATICACIDVEN 1.7  --  2.1* 2.4* 2.3*  --  1.2 1.4  --   --     Estimated Creatinine Clearance: 63.8 mL/min (by C-G formula based on SCr of 0.89 mg/dL).    Allergies  Allergen Reactions  . Codeine Nausea Only    Antimicrobials this admission:  3/20 Cefepime >> 3/20 Flagyl >>  Dose adjustments this admission:   Microbiology results:  3/20 BCx: NG x 2 days  Thank you for allowing pharmacy to be a part of this patient's care.  Tyna Jaksch, PharmD Candidate 06/10/2018 9:20 AM

## 2018-06-10 NOTE — Care Management Important Message (Signed)
Important Message  Patient Details  Name: Timothy Khan MRN: [id number] Date of Birth: Aug 10, 1931   Medicare Important Message Given:  Yes    Kerin Salen 06/10/2018, 12:44 [facility] Message  Patient Details  Name: Timothy Khan MRN: [id number] Date of Birth: Aug 14, 1931   Medicare Important Message Given:  Yes    Kerin Salen 06/10/2018, 12:44 PM

## 2018-06-10 NOTE — Plan of Care (Signed)

## 2018-06-10 NOTE — Anesthesia Preprocedure Evaluation (Addendum)
Anesthesia Evaluation  Patient identified by MRN, date of birth, ID band Patient awake    Reviewed: Allergy & Precautions, NPO status , Patient's Chart, lab work & pertinent test results  History of Anesthesia Complications Negative for: history of anesthetic complications  Airway Mallampati: II  TM Distance: >3 FB Neck ROM: Full    Dental  (+) Lower Dentures, Upper Dentures   Pulmonary former smoker,    breath sounds clear to auscultation       Cardiovascular hypertension, Pt. on medications (-) angina Rhythm:Regular Rate:Normal     Neuro/Psych  Neuromuscular disease (hereditary polyneuropathy)    GI/Hepatic GERD  Medicated,Pancreatitis   Endo/Other  negative endocrine ROS  Renal/GU negative Renal ROS     Musculoskeletal   Abdominal   Peds  Hematology negative hematology ROS (+)   Anesthesia Other Findings   Reproductive/Obstetrics                            Anesthesia Physical Anesthesia Plan  ASA: III  Anesthesia Plan: MAC   Post-op Pain Management:    Induction:   PONV Risk Score and Plan: Treatment may vary due to age or medical condition  Airway Management Planned: Natural Airway and Nasal Cannula  Additional Equipment:   Intra-op Plan:   Post-operative Plan:   Informed Consent: I have reviewed the patients History and Physical, chart, labs and discussed the procedure including the risks, benefits and alternatives for the proposed anesthesia with the patient or authorized representative who has indicated his/her understanding and acceptance.       Plan Discussed with: CRNA and Surgeon  Anesthesia Plan Comments:        Anesthesia Quick Evaluation

## 2018-06-10 NOTE — Progress Notes (Signed)
Valentina Gu Hoobler 10:07 AM  Subjective: Patient doing better and case discussed with my partner Dr. Therisa Doyne in his hospital computer chart reviewed and case discussed with his 2 daughters as well he does have some abdominal pain around the midline helped by Dilaudid but has not had a bowel movement in 1 week he wants to eat more and is breathing better answered all of their questions  Objective: Vital signs stable afebrile no acute distress no obvious wheezing abdomen is soft nontender a little distended okay bowel sounds minimal pedal edema no new labs  Assessment: Pancreatitis questionable etiology and abnormal CT for the esophagus  Plan: We will slowly advance diet consider adding pancreatic enzymes will add MiraLAX for his constipation and begin weaning his pain medicines which we discussed is playing a role with his constipation and proceed with his endoscopy tomorrow at 830 and the procedure was discussed with the patient and his family and recheck labs tomorrow  [facility]  Pager [phone] After 5PM or if no answer call [phone]

## 2018-06-10 NOTE — Progress Notes (Addendum)
PROGRESS NOTE  SCHYLAR WUEBKER DGL:[id number] DOB: February 26, 1932 DOA: 06/06/2018 PCP: [name], MD  Brief History   The patient is a 85 yr old man who carries a past medical history significant for BPH, Bilateral foot drop, gait disturbance, GERD, hyperlipdemia, hypertension, peripheral neuropathy, and history of cuncussion, skull fracture and abnormal MRI brain.  The patient presented to [facility] on 06/06/2018 with a complaint of severe epigastric abdominal pain, nausea and vomiting early that morning. He stated that the pain was sharp. 10/10 in severity. He states that he had multiple episodes of emesis and then dry heaves. He denies constipation of diarrhea. He denies fevers or chills. He has had no sick contact. He has had no cough, shortness of breath, or wheezes, He does not drink alcohol. He has never had anything like this in the past.  In the ED the patient was febrile, with a high blood pressure. His lipase was 5122 with a mildly elevated ALT. Remainder of LFT's were unremarkable. CT abdomen and pelvis revealed inflammatory changes in the peripancreatic fat. There are organized fluid collections surrounding the pancreas as well. This is no biliary ductal dilatation. No stones seen in gallbladder. It also demonstrates circumferential thicking of the distal esophagus. And an old compression fracture of L1.  GI was consulted and the patient underwent a right upper quadrant ultrasound that demonstrated no evidence of cholelithiasis.  On 06/07/2018 the patient had lactic acidosis at 2.3 and an increase in his WBC count to 28. He was started on IV cefepime and the sepsis protocol. WBC is down and lactic acid is 1.2 on 06/08/2018.  The patient is sitting up at bedside. No new complaints.   Consultants  . Gastroenterology  Procedures  . None  Antibiotics  . Cefepime  Interval History/Subjective  The patient is a 85 yr old man who carries a past medical history significant for BPH,  Bilateral foot drop, gait disturbance, GERD, hyperlipdemia, hypertension, peripheral neuropathy, and history of cuncussion, skull fracture and abnormal MRI brain.  The patient presented to [facility] on 06/06/2018 with a complaint of severe epigastric abdominal pain, nausea and vomiting early that morning. He stated that the pain was sharp. 10/10 in severity. He states that he had multiple episodes of emesis and then dry heaves. He denies constipation of diarrhea. He denies fevers or chills. He has had no sick contact. He has had no cough, shortness of breath, or wheezes, He does not drink alcohol. He has never had anything like this in the past.  In the ED the patient was febrile, with a high blood pressure. His lipase was 5122 with a mildly elevated ALT. Remainder of LFT's were unremarkable. CT abdomen and pelvis revealed inflammatory changes in the peripancreatic fat. There are organized fluid collections surrounding the pancreas as well. This is no biliary ductal dilatation. No stones seen in gallbladder. It also demonstrates circumferential thicking of the distal esophagus. And an old compression fracture of L1.  GI was consulted and the patient underwent a right upper quadrant ultrasound that demonstrated no evidence of cholecystitis.  On 06/07/2018 the patient had lactic acidosis at 2.3 and an increase in his WBC count to 28. He was started on IV cefepime and the sepsis protocol. WBC is down and lactic acid is 1.2 on 06/08/2018.  The patient has been a little hypoxic and has been requiring 1L O2 to maintain SaO2 in the mid 90's. 06/09/2018 CXR demonstrated low lung volumes, atelectasis and small pleural effusions R>L. Effusions are likely  the result of pancreatic inflammatory process. No cardiomegaly or pulmonary edema is noted. The patient is using incentive spirometry.   The patient is to continue clear liquids per GI. He states that he has had no BM x 5 days. 06/07/2018 the patient had runs of V. Tach and  atrial fibrillation this morning. This seems to have resolved with metoprolol 5 mg x1. This patient's atrial fibrillation converted to sinus without intervention.  Objective   Vitals:  Vitals:   06/09/18 2109 06/10/18 0538  BP: (!) 179/73 (!) 155/72  Pulse: (!) 105 (!) 102  Resp: 20 18  Temp: 99 F (37.2 C) 98.9 F (37.2 C)  SpO2: 95% 93%    Exam:  Constitutional:  . The patient is awake, alert, and oriented x 3. No acute distress. Respiratory:  . No increased work of breathing.  . Very small wheezes present. No rales or rhonchi. . No tactile fremitus. Cardiovascular:  . Tachycardic with rate in the 100's.. No murmurs, ectopy or gallups. . No LE extremity edema   . Normal pedal pulses Abdomen:  . Abdomen is distended. It is tender especially in the epigastrum and less so diffusely. Less tender than yesterday. . No hernias, masses, or organomegaly are appreciated. . Normoactive bowel sounds. Musculoskeletal:  . No cyanosis, clubbing or edema Skin:  . No rashes, lesions, ulcers . palpation of skin: no induration or nodules, no edema . Warm, dry, and intact Neurologic:  . CN 2-12 intact . Patient is moving all extremities. Psychiatric:  . Mental status o Mood, affect appropriate o Orientation to person, place, time  . judgment and insight appear intact     I have personally reviewed the following:   Today's Data  . CBC, BMP, Vitals.  Imaging  . CXR   Scheduled Meds: . bisacodyl  10 mg Rectal Once  . cetirizine  10 mg Oral Daily  . diltiazem  90 mg Oral Q6H  . enoxaparin (LOVENOX) injection  40 mg Subcutaneous Q24H  . fluticasone  2 spray Each Nare QHS  . gabapentin  600 mg Oral QHS  . lactulose  10 g Oral TID  . mouth rinse  15 mL Mouth Rinse BID  . pantoprazole (PROTONIX) IV  40 mg Intravenous Q24H  . polyethylene glycol  17 g Oral BID  . tamsulosin  0.4 mg Oral QHS   Continuous Infusions: . ceFEPime (MAXIPIME) IV 2 g (06/10/18 0539)  . lactated  ringers 50 mL/hr at 06/10/18 0700  . metronidazole 500 mg (06/10/18 1610)    Principal Problem:   Acute pancreatitis Active Problems:   Hereditary and idiopathic peripheral neuropathy   Hypertension   Foot drop, bilateral   BPH (benign prostatic hyperplasia)   LBBB (left bundle branch block)   GERD (gastroesophageal reflux disease)   Esophageal thickening   Hiatal hernia   A & P  Sepsis: Resolved. Tachycardia, low grade temperature, WBC 28.3, Lactic acid of 2.4. Blood cultures x 2 obtained. Patient started on cefepime. Sepsis protocol initiated. Today WBC down to 24.4 and lactic acid down to 1.2. Pt remains tachycardic, but afebrile. Monitor.  Acute pancreatitis: Increase in pain, distention and inflammatory changes of pancreas. Pain control, IV fluids, anti-emetics. Patient was taking HCTZ at home. Perhaps is a cause. He is not a drinker. TGL is 166. WBC up from 12.1 to 28.2 initially. Now 20.1.  Cefepime and Flagyl started. No family history. Thus far gallbladder and biliary tree appear benign. Further work up as per GI recommendations.  Pt has been continued on a clear liquid diet.   Esophageal thickening: GI plans on EGD prior to discharge. Concerning for malignancy, although the patient does have GERD. EGD tomorrow per GI.  Tachycardia/Arrhythmia: The patient has had no further arrhythmias since 06/08/2018. Will increase dose of diltiazem to help with heart rate and blood pressure. HR iin low 100's at rest.  Hypoxia/Wheeze: CXR demonstrates atelectasis, low ung volumes and small pleural effusions. Decrease IV fluid rate. Pt will receive albuterol nebs as needed. I will also order incentive spirometry. No radiographic sign of cardiomegaly or pulmonary edema. Pleural effusions present likely due to pancreatic inflammation. Pt is using incentive spirometry.  Hyponatremia/Hypokalemia: Likely due to laxis 40 mg given last night. Stopped. Monitor electrolytes and supplement potassium.  New  LBBB and prolonged PR interval since last EKG in system from 04/2013: Echocardiogram pending. Troponin negative.  Hypertension: Elevated at 155/99. Possibly due to pain or the fact that his cozaar has not yet been restarted. He is receiving prn hydralazine. Will continue that for now until it is clear that he is able to tolerate PO. HCTZ stopped as it is a possible cause of pancreatitis. Increase diltiazem.  Constipation: No BM x 6 days per patient. Last BM recorded by Nursing was 06/03/2018. Will give bisacodyl suppository and lactulose. GI has added Miralax.  Monitor. No signs of obstruction on CT.  BPH: Continue Flomax as at home. Monitor.  GERD: continue protonix  Hiatal Hernia: Noted. Monitor  DVT prophylaxis: Lovenox Code Status: Full Code Family Communication: None present Disposition Plan: Home  Freddie Nghiem, DO [facility] Direct contact: see [email or web address]  7PM-7AM contact night coverage as above   LOS: 4 days

## 2018-06-10 NOTE — Progress Notes (Signed)
SATURATION QUALIFICATIONS: (This note is used to comply with regulatory documentation for home oxygen)  Patient Saturations on Room Air at Rest = 88%  Patient Saturations on Room Air while Ambulating = 85%  Patient Saturations on 2 Liters of oxygen while Ambulating = 90%  Please briefly explain why patient needs home oxygen: 

## 2018-06-11 ENCOUNTER — Inpatient Hospital Stay (HOSPITAL_COMMUNITY): Payer: Medicare Other | Admitting: Anesthesiology

## 2018-06-11 ENCOUNTER — Encounter (HOSPITAL_COMMUNITY): Payer: Self-pay | Admitting: Certified Registered"

## 2018-06-11 ENCOUNTER — Encounter (HOSPITAL_COMMUNITY)
Admission: EM | Disposition: A | Discharge status: Home Health | Payer: Self-pay | Source: Home / Self Care | Attending: Internal Medicine

## 2018-06-11 HISTORY — PX: ESOPHAGOGASTRODUODENOSCOPY (EGD) WITH PROPOFOL: SHX5813

## 2018-06-11 LAB — CBC WITH DIFFERENTIAL/PLATELET
Abs Immature Granulocytes: 0.33 10*3/uL — ABNORMAL HIGH (ref 0.00–0.07)
BASOS ABS: 0.1 10*3/uL (ref 0.0–0.1)
Basophils Relative: 0 %
EOS ABS: 0.1 10*3/uL (ref 0.0–0.5)
EOS PCT: 1 %
HCT: 38.4 % — ABNORMAL LOW (ref 39.0–52.0)
Hemoglobin: 12.2 g/dL — ABNORMAL LOW (ref 13.0–17.0)
Immature Granulocytes: 2 %
Lymphocytes Relative: 6 %
Lymphs Abs: 1.2 10*3/uL (ref 0.7–4.0)
MCH: 28.8 pg (ref 26.0–34.0)
MCHC: 31.8 g/dL (ref 30.0–36.0)
MCV: 90.6 fL (ref 80.0–100.0)
Monocytes Absolute: 1.5 10*3/uL — ABNORMAL HIGH (ref 0.1–1.0)
Monocytes Relative: 7 %
NRBC: 0 % (ref 0.0–0.2)
Neutro Abs: 17.3 10*3/uL — ABNORMAL HIGH (ref 1.7–7.7)
Neutrophils Relative %: 84 %
Platelets: 194 10*3/uL (ref 150–400)
RBC: 4.24 MIL/uL (ref 4.22–5.81)
RDW: 13.1 % (ref 11.5–15.5)
WBC: 20.5 10*3/uL — ABNORMAL HIGH (ref 4.0–10.5)

## 2018-06-11 LAB — LIPASE, BLOOD: Lipase: 36 U/L (ref 11–51)

## 2018-06-11 LAB — COMPREHENSIVE METABOLIC PANEL
ALBUMIN: 2.8 g/dL — AB (ref 3.5–5.0)
ALT: 23 U/L (ref 0–44)
AST: 21 U/L (ref 15–41)
Alkaline Phosphatase: 66 U/L (ref 38–126)
Anion gap: 6 (ref 5–15)
BUN: 16 mg/dL (ref 8–23)
CO2: 25 mmol/L (ref 22–32)
Calcium: 7.3 mg/dL — ABNORMAL LOW (ref 8.9–10.3)
Chloride: 101 mmol/L (ref 98–111)
Creatinine, Ser: 0.9 mg/dL (ref 0.61–1.24)
GFR calc Af Amer: >60 mL/min (ref >60)
GFR calc non Af Amer: >60 mL/min (ref >60)
Glucose, Bld: 124 mg/dL — ABNORMAL HIGH (ref 70–99)
Potassium: 4.2 mmol/L (ref 3.5–5.1)
Sodium: 132 mmol/L — ABNORMAL LOW (ref 135–145)
Total Bilirubin: 0.7 mg/dL (ref 0.3–1.2)
Total Protein: 5.7 g/dL — ABNORMAL LOW (ref 6.5–8.1)

## 2018-06-11 SURGERY — ESOPHAGOGASTRODUODENOSCOPY (EGD) WITH PROPOFOL
Anesthesia: Monitor Anesthesia Care

## 2018-06-11 MED ORDER — LIDOCAINE 2% (20 MG/ML) 5 ML SYRINGE
INTRAMUSCULAR | Status: DC | PRN
  Administered 2018-06-11: 50 mg via INTRAVENOUS

## 2018-06-11 MED ORDER — HYDROMORPHONE HCL 1 MG/ML IJ SOLN: 0.5000 mg | INTRAMUSCULAR | Status: DC | PRN

## 2018-06-11 MED ORDER — PROPOFOL 10 MG/ML IV BOLUS
INTRAVENOUS | Status: DC | PRN
  Administered 2018-06-11 (×2): 30 mg via INTRAVENOUS

## 2018-06-11 MED ORDER — SODIUM CHLORIDE 0.9 % IV SOLN: INTRAVENOUS | Status: DC

## 2018-06-11 MED ORDER — LACTATED RINGERS IV SOLN
INTRAVENOUS | Status: DC
  Administered 2018-06-11: 08:00:00 via INTRAVENOUS

## 2018-06-11 MED ORDER — PROPOFOL 10 MG/ML IV BOLUS
INTRAVENOUS | Status: AC
  Filled 2018-06-11: qty 40

## 2018-06-11 SURGICAL SUPPLY — 15 items

## 2018-06-11 NOTE — Progress Notes (Signed)
Pt MEWS score a 2.  Will initiate MEWS protocol. MD made aware. Stacey Drain

## 2018-06-11 NOTE — Anesthesia Postprocedure Evaluation (Signed)
Anesthesia Post Note  Patient: Timothy Khan  Procedure(s) Performed: ESOPHAGOGASTRODUODENOSCOPY (EGD) WITH PROPOFOL (N/A )     Patient location during evaluation: Endoscopy Anesthesia Type: MAC Level of consciousness: awake and alert, oriented and patient cooperative Pain management: pain level controlled Vital Signs Assessment: post-procedure vital signs reviewed and stable Respiratory status: spontaneous breathing, nonlabored ventilation and respiratory function stable Cardiovascular status: blood pressure returned to baseline and stable Postop Assessment: no apparent nausea or vomiting Anesthetic complications: no    Last Vitals:  Vitals:   06/11/18 0913 06/11/18 0930  BP: (!) 102/56 (!) 129/54  Pulse: 96 100  Resp: 15 16  Temp: 37.2 C 37.2 C  SpO2: 97% 98%    Last Pain:  Vitals:   06/11/18 0930  TempSrc: Oral  PainSc: 0-No pain                 Tayari Yankee,E. Patrese Neal

## 2018-06-11 NOTE — Transfer of Care (Signed)
Immediate Anesthesia Transfer of Care Note  Patient: Timothy Khan  Procedure(s) Performed: ESOPHAGOGASTRODUODENOSCOPY (EGD) WITH PROPOFOL (N/A )  Patient Location: PACU  Anesthesia Type:MAC  Level of Consciousness: awake, alert  and patient cooperative  Airway & Oxygen Therapy: Patient Spontanous Breathing and Patient connected to nasal cannula oxygen  Post-op Assessment: Report given to RN and Post -op Vital signs reviewed and stable  Post vital signs: Reviewed and stable  Last Vitals:  Vitals Value Taken Time  BP 102/56 06/11/2018  9:12 AM  Temp    Pulse 96 06/11/2018  9:12 AM  Resp 15 06/11/2018  9:12 AM  SpO2 97 % 06/11/2018  9:12 AM  Vitals shown include unvalidated device data.  Last Pain:  Vitals:   06/11/18 0754  TempSrc: Oral  PainSc: 0-No pain      Patients Stated Pain Goal: 2 (19/75/88 3254)  Complications: No apparent anesthesia complications

## 2018-06-11 NOTE — Op Note (Signed)
[facility] Patient Name: Timothy Khan Procedure Date: 06/11/2018 MRN: [id number] Attending MD: Clarene Essex , MD Date of Birth: December 17, 1931 CSN: [id number] Age: 85 Admit Type: Inpatient Procedure:                Upper GI endoscopy Indications:              Epigastric abdominal pain, Abnormal CT of the GI                            tract Providers:                Clarene Essex, MD, Cleda Daub, RN, William Dalton,                            Technician Referring MD:              Medicines:                Propofol total dose 60 mg IV, 100 mg IV lidocaine Complications:            No immediate complications. Estimated Blood Loss:     Estimated blood loss: none. Procedure:                Pre-Anesthesia Assessment:                           - Prior to the procedure, a History and Physical                            was performed, and patient medications and                            allergies were reviewed. The patient's tolerance of                            previous anesthesia was also reviewed. The risks                            and benefits of the procedure and the sedation                            options and risks were discussed with the patient.                            All questions were answered, and informed consent                            was obtained. Prior Anticoagulants: The patient has                            taken Lovenox (enoxaparin), last dose was 1 day                            prior to procedure. ASA Grade Assessment: II - A  patient with mild systemic disease. After reviewing                            the risks and benefits, the patient was deemed in                            satisfactory condition to undergo the procedure.                           After obtaining informed consent, the endoscope was                            passed under direct vision. Throughout the                            procedure,  the patient's blood pressure, pulse, and                            oxygen saturations were monitored continuously. The                            GIF-H190 ([id number]) Olympus gastroscope was                            introduced through the mouth, and advanced to the                            third part of duodenum. The upper GI endoscopy was                            accomplished without difficulty. The patient                            tolerated the procedure well. Scope In: Scope Out: Findings:      A small hiatal hernia was present.      One benign-appearing, intrinsic mild stenosis was found. The stenosis       was traversed.      A few diminutive semi-sessile polyps were found in the gastric body.      The duodenal bulb, first portion of the duodenum, second portion of the       duodenum and third portion of the duodenum were normal.      The exam was otherwise without abnormality. Impression:               - Small hiatal hernia.                           - Benign-appearing esophageal stenosis.                           - A few gastric polyps.                           - Normal duodenal bulb, first portion of the  duodenum, second portion of the duodenum and third                            portion of the duodenum.                           - The examination was otherwise normal.                           - No specimens collected. Moderate Sedation:      Not Applicable - Patient had care per Anesthesia. Recommendation:           - Soft diet today. Consider pancreatic enzymes if                            needed in the future                           - Continue present medications. Continue to wean                            pain medicines and possibly increase laxatives                           - Return to GI clinic PRN. Try to increase activity                            to hopefully help his bowels                           - Telephone GI clinic  if symptomatic PRN. Procedure Code(s):        --- Professional ---                           [phone], Esophagogastroduodenoscopy, flexible,                            transoral; diagnostic, including collection of                            specimen(s) by brushing or washing, when performed                            (separate procedure) Diagnosis Code(s):        --- Professional ---                           K44.9, Diaphragmatic hernia without obstruction or                            gangrene                           K22.2, Esophageal obstruction                           K31.7, Polyp of  stomach and duodenum                           R10.13, Epigastric pain                           R93.3, Abnormal findings on diagnostic imaging of                            other parts of digestive tract CPT copyright 2018 [employer]. All rights reserved. The codes documented in this report are preliminary and upon coder review may  be revised to meet current compliance requirements. Clarene Essex, MD 06/11/2018 9:10:02 AM This report has been signed electronically. Number of Addenda: 0

## 2018-06-11 NOTE — Plan of Care (Signed)
  Problem: Education: Goal: Knowledge of General Education information will improve Description Including pain rating scale, medication(s)/side effects and non-pharmacologic comfort measures Outcome: Progressing   

## 2018-06-11 NOTE — Progress Notes (Addendum)
PROGRESS NOTE  Timothy Khan OEV:[id number] DOB: 1931-08-01 DOA: 06/06/2018 PCP: [name], MD  Brief History   The patient is a 85 yr old man who carries a past medical history significant for BPH, Bilateral foot drop, gait disturbance, GERD, hyperlipdemia, hypertension, peripheral neuropathy, and history of cuncussion, skull fracture and abnormal MRI brain.  The patient presented to [facility] on 06/06/2018 with a complaint of severe epigastric abdominal pain, nausea and vomiting early that morning. He stated that the pain was sharp. 10/10 in severity. He states that he had multiple episodes of emesis and then dry heaves. He denies constipation of diarrhea. He denies fevers or chills. He has had no sick contact. He has had no cough, shortness of breath, or wheezes, He does not drink alcohol. He has never had anything like this in the past.  In the ED the patient was febrile, with a high blood pressure. His lipase was 5122 with a mildly elevated ALT. Remainder of LFT's were unremarkable. CT abdomen and pelvis revealed inflammatory changes in the peripancreatic fat. There are organized fluid collections surrounding the pancreas as well. This is no biliary ductal dilatation. No stones seen in gallbladder. It also demonstrates circumferential thicking of the distal esophagus. And an old compression fracture of L1.  GI was consulted and the patient underwent a right upper quadrant ultrasound that demonstrated no evidence of cholelithiasis.  On 06/07/2018 the patient had lactic acidosis at 2.3 and an increase in his WBC count to 28. He was started on IV cefepime and the sepsis protocol. WBC is down and lactic acid is 1.2 on 06/08/2018.  The patient is sitting up at bedside. No new complaints. Still no BM. Still with abdominal pain.  Consultants  . Gastroenterology  Procedures  . None  Antibiotics  . Cefepime  Interval History/Subjective  The patient is a 85 yr old man who carries a past  medical history significant for BPH, Bilateral foot drop, gait disturbance, GERD, hyperlipdemia, hypertension, peripheral neuropathy, and history of cuncussion, skull fracture and abnormal MRI brain.  The patient presented to [facility] on 06/06/2018 with a complaint of severe epigastric abdominal pain, nausea and vomiting early that morning. He stated that the pain was sharp. 10/10 in severity. He states that he had multiple episodes of emesis and then dry heaves. He denies constipation of diarrhea. He denies fevers or chills. He has had no sick contact. He has had no cough, shortness of breath, or wheezes, He does not drink alcohol. He has never had anything like this in the past.  In the ED the patient was febrile, with a high blood pressure. His lipase was 5122 with a mildly elevated ALT. Remainder of LFT's were unremarkable. CT abdomen and pelvis revealed inflammatory changes in the peripancreatic fat. There are organized fluid collections surrounding the pancreas as well. This is no biliary ductal dilatation. No stones seen in gallbladder. It also demonstrates circumferential thicking of the distal esophagus. And an old compression fracture of L1.  GI was consulted and the patient underwent a right upper quadrant ultrasound that demonstrated no evidence of cholecystitis.  On 06/07/2018 the patient had lactic acidosis at 2.3 and an increase in his WBC count to 28. He was started on IV cefepime and the sepsis protocol. WBC is down and lactic acid is 1.2 on 06/08/2018.  The patient has been desaturating to the high 80's with ambulation on 2 liters.. 06/09/2018 CXR demonstrated low lung volumes, atelectasis and small pleural effusions R>L. Effusions are likely  the result of pancreatic inflammatory process. No cardiomegaly or pulmonary edema is noted. The patient is using incentive spirometry. He is ambulating in the halls with assistance tid.  06/07/2018 the patient had runs of V. Tach and atrial fibrillation  this morning. This seems to have resolved with metoprolol 5 mg x1. This patient's atrial fibrillation converted to sinus without intervention.  The patient is to continue clear liquids per GI. He states that he has had no BM x 7 days. He is receiving miralax and lactulose in addition to suppositories.    Objective   Vitals:  Vitals:   06/11/18 1221 06/11/18 1355  BP: (!) 147/74 (!) 148/77  Pulse: 97 89  Resp: 18 16  Temp: 99 F (37.2 C) 98.3 F (36.8 C)  SpO2: 98% 97%    Exam:  Constitutional:  . The patient is awake, alert, and oriented x 3. No acute distress. Respiratory:  . No increased work of breathing.  . Very small wheezes present. No rales or rhonchi. . No tactile fremitus. Cardiovascular:  . Tachycardic with rate in the 100's.. No murmurs, ectopy or gallups. . No LE extremity edema   . Normal pedal pulses Abdomen:  . Abdomen is distended. It is tender especially in the epigastrum and less so diffusely. Exam is relatively unchanged from yesterday. Wean narcotics. . No hernias, masses, or organomegaly are appreciated. . Normoactive bowel sounds. Musculoskeletal:  . No cyanosis, clubbing or edema Skin:  . No rashes, lesions, ulcers . palpation of skin: no induration or nodules, no edema . Warm, dry, and intact Neurologic:  . CN 2-12 intact . Patient is moving all extremities. Psychiatric:  . Mental status o Mood, affect appropriate o Orientation to person, place, time  . judgment and insight appear intact     I have personally reviewed the following:   Today's Data  . CBC, BMP, Vitals.  Imaging  . CXR   Scheduled Meds: . cetirizine  10 mg Oral Daily  . diltiazem  90 mg Oral Q6H  . enoxaparin (LOVENOX) injection  40 mg Subcutaneous Q24H  . fluticasone  2 spray Each Nare QHS  . gabapentin  600 mg Oral QHS  . lactulose  10 g Oral TID  . mouth rinse  15 mL Mouth Rinse BID  . pantoprazole (PROTONIX) IV  40 mg Intravenous Q24H  . polyethylene glycol   17 g Oral BID  . tamsulosin  0.4 mg Oral QHS   Continuous Infusions: . ceFEPime (MAXIPIME) IV 2 g (06/11/18 0515)  . lactated ringers 50 mL/hr at 06/11/18 0954  . metronidazole 500 mg (06/11/18 1109)    Principal Problem:   Acute pancreatitis Active Problems:   Hereditary and idiopathic peripheral neuropathy   Hypertension   Foot drop, bilateral   BPH (benign prostatic hyperplasia)   LBBB (left bundle branch block)   GERD (gastroesophageal reflux disease)   Esophageal thickening   Hiatal hernia   A & P  Sepsis: Resolved. Tachycardia, low grade temperature, WBC 28.3, Lactic acid of 2.4. Blood cultures x 2 obtained. Patient started on cefepime. Sepsis protocol initiated. Today WBC down to 24.4 and lactic acid down to 1.2. Pt remains tachycardic, but afebrile. Monitor.  Acute pancreatitis: Increase in pain, distention and inflammatory changes of pancreas. Pain control, IV fluids, anti-emetics. Patient was taking HCTZ at home. Perhaps is a cause. He is not a drinker. TGL is 166. WBC up from 12.1 to 28.2 initially. Now 20.5.  Cefepime and Flagyl started. No family history.  Thus far gallbladder and biliary tree appear benign. Further work up as per GI recommendations. GI has advanced the patient to a soft diet.   Esophageal thickening: EGD for later today.   Tachycardia/Arrhythmia: Resolving. The patient has had no further arrhythmias since 06/08/2018. Will increase dose of diltiazem to help with heart rate and blood pressure. HR in 80's to 90's.  Hypoxia/Wheeze: CXR demonstrates atelectasis, low ung volumes and small pleural effusions. Decrease IV fluid rate. Pt will receive albuterol nebs as needed. I will also order incentive spirometry. No radiographic sign of cardiomegaly or pulmonary edema. Pleural effusions present likely due to pancreatic inflammation. Pt is using incentive spirometry. He is ambulating. Relief of constipation and abdominal distention would improve respiratory status.   Leukocytosis: 20.1 with left shift. Likely sources are pancreatis and constipation. Will check CXR again. Monitor.   Hyponatremia/Hypokalemia: Resolved.  New LBBB and prolonged PR interval since last EKG in system from 04/2013: Echocardiogram pending. Troponin negative.  Hypertension: Elevated at 155/99. Possibly due to pain or the fact that his cozaar has not yet been restarted. He is receiving prn hydralazine. Will continue that for now until it is clear that he is able to tolerate PO. HCTZ stopped as it is a possible cause of pancreatitis. Increase diltiazem.  Constipation: No BM x 6 days per patient. Last BM recorded by Nursing was 06/03/2018. Will give bisacodyl suppository and lactulose. GI has added Miralax.  Monitor. No signs of obstruction on CT.  BPH: Continue Flomax as at home. Monitor.  GERD: continue protonix  Hiatal Hernia: Noted. Monitor  DVT prophylaxis: Lovenox Code Status: Full Code Family Communication: None present Disposition Plan: Home  Bellamarie Pflug, DO [facility] Direct contact: see [email or web address]  7PM-7AM contact night coverage as above   LOS: 5 days

## 2018-06-11 NOTE — Progress Notes (Signed)
Timothy Khan 8:38 AM  Subjective: Patient doing well without any new complaints and still has not had a bowel movement and tolerated his diet  Objective: Vital signs stable afebrile no acute distress exam please see preassessment evaluation chemistries okay lipase normal white count still 20  Assessment: Pancreatitis and abnormal CT  Plan: Okay to proceed with endoscopy with anesthesia assistance  [facility]  Pager [phone] After 5PM or if no answer call [phone]

## 2018-06-11 NOTE — Progress Notes (Signed)
Taking over care of patient agree with previous RN assessment. Denies any needs at this time, will continue to monitor.  

## 2018-06-12 ENCOUNTER — Inpatient Hospital Stay (HOSPITAL_COMMUNITY): Payer: Medicare Other

## 2018-06-12 ENCOUNTER — Other Ambulatory Visit (HOSPITAL_COMMUNITY): Payer: Medicare Other

## 2018-06-12 LAB — CULTURE, BLOOD (ROUTINE X 2)
Culture: NO GROWTH
Culture: NO GROWTH
Special Requests: ADEQUATE
Special Requests: ADEQUATE

## 2018-06-12 LAB — BRAIN NATRIURETIC PEPTIDE: B Natriuretic Peptide: 170 pg/mL — ABNORMAL HIGH (ref 0.0–100.0)

## 2018-06-12 LAB — PROCALCITONIN: Procalcitonin: 0.55 ng/mL

## 2018-06-12 MED ORDER — PANTOPRAZOLE SODIUM 40 MG PO TBEC
40.0000 mg | DELAYED_RELEASE_TABLET | Freq: Every day | ORAL | Status: DC
  Administered 2018-06-12 – 2018-06-14 (×3): 40 mg via ORAL
  Filled 2018-06-12 (×3): qty 1

## 2018-06-12 MED ORDER — OXYCODONE HCL 5 MG PO TABS
2.5000 mg | ORAL_TABLET | Freq: Four times a day (QID) | ORAL | Status: DC | PRN
  Administered 2018-06-12 – 2018-06-13 (×2): 5 mg via ORAL
  Filled 2018-06-12 (×2): qty 1

## 2018-06-12 MED ORDER — FUROSEMIDE 10 MG/ML IJ SOLN
40.0000 mg | Freq: Two times a day (BID) | INTRAMUSCULAR | Status: DC
  Administered 2018-06-12 (×2): 40 mg via INTRAVENOUS
  Filled 2018-06-12 (×2): qty 4

## 2018-06-12 MED ORDER — POTASSIUM CHLORIDE CRYS ER 20 MEQ PO TBCR
40.0000 meq | EXTENDED_RELEASE_TABLET | Freq: Every day | ORAL | Status: DC
  Administered 2018-06-12 – 2018-06-15 (×4): 40 meq via ORAL
  Filled 2018-06-12 (×4): qty 2

## 2018-06-12 NOTE — Progress Notes (Addendum)
Pt has coughed twice after receiving PO medications and coughs at times after drinking. Pt also wheezes much worse after coughing and drinking. Daughter at beside concerned. Breathing treatment does help along with IV Lasix given. Pt resting comfortably at this time, in no distress. O2 Sats stable on 2L/[place]. MD Purohit made aware- ST Eval ordered. Will make pt and daughter aware.

## 2018-06-12 NOTE — Progress Notes (Signed)
Patient ID: Timothy Khan, male   DOB: 1931/05/22, 85 y.o.   MRN: [id number] Request received for paracentesis on pt. Bedside limited US abd in all four quadrants today reveals only trace amount of ascites in RUQ and not enough to safely access at this time. Procedure cancelled. Pt/family aware.

## 2018-06-12 NOTE — Progress Notes (Signed)
Patient continues to have an upper airway wheeze, oxygen saturations are in the mid to high 90's on 2L o2 [place]. He has been using his incentive spirometer. Received nebulizer treatment overnight and K Baltazar Najjar was made aware of wheezing. This morning patient stated "I feel like its a hair better" regarding his breathing.  Scrotal swelling was noted last night and this morning. Patient states it feels "a little sensitive" and is not usually swollen but is unsure how long the swelling has been there. Elevated scrotum and notified Tylene Fantasia NP. Patient is not in distress and states he is comfortable. Will continue with plan of care and pass on to the oncoming RN.

## 2018-06-12 NOTE — Progress Notes (Signed)
Pharmacy IV to PO conversion  The patient is receiving pantoprazole by the intravenous route.  Based on criteria approved by the Pharmacy and [place], the medication is being converted to the equivalent oral dose form.   No active GI bleeding or impaired absorption  Not s/p esophagectomy  Documented ability to take oral medications for > 24 hr  Plan to continue treatment for at least 1 day  If you have any questions about this conversion, please contact the Pharmacy Department (ext [phone]).  Thank you.  Reuel Boom, PharmD, BCPS [phone] 06/12/2018, 12:22 PM

## 2018-06-12 NOTE — Progress Notes (Signed)
Pt had a large, soft, brown BM. GI MD to see patient between 11:30-12 tomorrow afternoon- daughter aware.

## 2018-06-12 NOTE — Progress Notes (Signed)
PROGRESS NOTE    Timothy Khan  OEU:[id number] DOB: 1931-10-05 DOA: 06/06/2018 PCP: [name], MD   Brief Narrative:  85 year old with past medical history relevant for hereditary peripheral neuropathy, hypertension, BPH admitted with nausea, vomiting, domino pain and found to have acute pancreatitis.  His hospital course has been complicated by wheezing and fluid overload.   Assessment & Plan:   Principal Problem:   Acute pancreatitis Active Problems:   Hereditary and idiopathic peripheral neuropathy   Hypertension   Foot drop, bilateral   BPH (benign prostatic hyperplasia)   LBBB (left bundle branch block)   GERD (gastroesophageal reflux disease)   Esophageal thickening   Hiatal hernia   #) Wheezing/fluid overload: At this time it is unclear with the patient's wheezing is related to cardiac causes versus underlying lung disease.  While he certainly does appear to be fluid overloaded he additionally does have minimal edema on chest x-ray but does have small pleural effusions. -BNP is minimally elevated -Strict ins and outs, weigh daily -Start IV furosemide 40 mg twice daily -Echo ordered -Continue PRN short-acting bronchodilators  #) Abdominal distention: This is also likely contributing to his poor respiratory status.  His abdomen is quite distended.  He is CT scan 3 days ago did show some evidence of abdominal pelvic ascites. - We will check ultrasound for possible paracentesis - We will discuss with IR about therapeutic paracentesis to help diaphragmatic excursion  #) Idiopathic pancreatitis: No evidence of gallstones on right upper quadrant ultrasound and no hypertriglyceridemia.  On review his medications there was a concern that HCTZ could cause pancreatitis and that this is being held. - Continue regular diet -Discontinue IV fluids  #) Hypertension: - Hold HCTZ 12.5 mg daily - Restart losartan 50 mg daily  #) Hereditary neuropathy: -Continue gabapentin  600 mg nightly  #) BPH: -Continue tamsulosin 0.4 mg nightly  Fluids: Restrict Electrolytes: Monitor and supplement Nutrition: Regular diet  Prophylaxis: Enoxaparin  Disposition: Pending improved respiratory status  Full code     Consultants:   Gastroenterology  Procedures:   Echo 06/12/2018: Pending  Antimicrobials:  Cefepime and metronidazole started 06/06/2020 ongoing   Subjective: This morning the patient reports he is sleepy but doing well.  He continues to report significant exertional dyspnea and shortness of breath.  Continues to have wheezing at rest.  He denies any chest pain, nausea, vomiting, diarrhea, cough, congestion, rhinorrhea.  Objective: Vitals:   06/11/18 2204 06/12/18 0001 06/12/18 0152 06/12/18 0555  BP: (!) 158/62 (!) 167/74 (!) 154/79 (!) 167/71  Pulse: 98 96 96 93  Resp: 16  16 18   Temp: 99.1 F (37.3 C)  99.6 F (37.6 C) 99.6 F (37.6 C)  TempSrc: Oral  Oral Oral  SpO2: 92% 94% 94% 96%  Weight:      Height:        Intake/Output Summary (Last 24 hours) at 06/12/2018 1540 Last data filed at 06/12/2018 0800 Gross per 24 hour  Intake 1674.39 ml  Output 700 ml  Net 974.39 ml   Filed Weights   06/06/18 1800  Weight: 77.7 kg    Examination:  General exam: Appears calm and comfortable  Respiratory system: Mildly increased work of breathing, audible wheezing, scattered crackles, no rhonchi Cardiovascular system: Distant heart sounds, regular rate and rhythm, no murmurs Gastrointestinal system: Soft, tympanitic, shifting dullness, no rebound or guarding, hypoactive bowel sounds Central nervous system: Alert and oriented.  Grossly intact, moving all extremities Extremities: 2+ lower extremity edema Skin: No rashes  over visible skin Psychiatry: Judgement and insight appear normal. Mood & affect appropriate.     Data Reviewed: I have personally reviewed following labs and imaging studies  CBC: Recent Labs  Lab 06/07/18 0428  06/07/18 1709 06/08/18 0509 06/09/18 0526 06/11/18 0445  WBC 28.2* 28.3* 24.4* 20.1* 20.5*  NEUTROABS  --  25.9* 22.0* 18.1* 17.3*  HGB 15.3 14.8 13.3 12.5* 12.2*  HCT 48.0 47.3 42.5 38.0* 38.4*  MCV 90.6 92.4 92.6 88.6 90.6  PLT 280 231 205 178 431   Basic Metabolic Panel: Recent Labs  Lab 06/07/18 1709 06/08/18 0509 06/09/18 0526 06/09/18 1523 06/11/18 0445  NA 136 136 132* 128* 132*  K 4.1 3.5 2.7* 3.2* 4.2  CL 104 109 100 98 101  CO2 23 21* 22 22 25   GLUCOSE 142* 130* 125* 182* 124*  BUN 18 16 14 15 16   CREATININE 1.26* 0.98 0.81 0.89 0.90  CALCIUM 7.3* 6.8* 7.0* 7.1* 7.3*   GFR: Estimated Creatinine Clearance: 63.1 mL/min (by C-G formula based on SCr of 0.9 mg/dL). Liver Function Tests: Recent Labs  Lab 06/06/18 0905 06/07/18 0428 06/07/18 1709 06/09/18 0526 06/11/18 0445  AST 80* 73* 47* 29 21  ALT 63* 84* 65* 32 23  ALKPHOS 95 70 64 53 66  BILITOT 1.1 1.0 1.3* 1.1 0.7  PROT 7.5 6.3* 6.3* 5.7* 5.7*  ALBUMIN 4.2 3.3* 3.2* 2.8* 2.8*   Recent Labs  Lab 06/06/18 0905 06/11/18 0445  LIPASE 5,122* 36   No results for input(s): AMMONIA in the last 168 hours. Coagulation Profile: Recent Labs  Lab 06/07/18 1709  INR 1.2   Cardiac Enzymes: Recent Labs  Lab 06/06/18 0905  TROPONINI <0.03   BNP (last 3 results) No results for input(s): PROBNP in the last 8760 hours. HbA1C: No results for input(s): HGBA1C in the last 72 hours. CBG: Recent Labs  Lab 06/07/18 0805  GLUCAP 156*   Lipid Profile: No results for input(s): CHOL, HDL, LDLCALC, TRIG, CHOLHDL, LDLDIRECT in the last 72 hours. Thyroid Function Tests: No results for input(s): TSH, T4TOTAL, FREET4, T3FREE, THYROIDAB in the last 72 hours. Anemia Panel: No results for input(s): VITAMINB12, FOLATE, FERRITIN, TIBC, IRON, RETICCTPCT in the last 72 hours. Sepsis Labs: Recent Labs  Lab 06/07/18 1709 06/07/18 1935 06/08/18 0827 06/08/18 1131 06/12/18 0750  PROCALCITON 0.35  --   --   --   0.55  LATICACIDVEN 2.4* 2.3* 1.2 1.4  --     Recent Results (from the past 240 hour(s))  Culture, blood (x 2)     Status: None (Preliminary result)   Collection Time: 06/07/18  5:09 PM  Result Value Ref Range Status   Specimen Description   Final    BLOOD LEFT ANTECUBITAL Performed at [facility], [place] [address], [place], [place] [address]    Special Requests   Final    BOTTLES DRAWN AEROBIC ONLY Blood Culture adequate volume Performed at [facility] [address] [place], [place] [address]    Culture   Final    NO GROWTH 4 DAYS Performed at [facility], [place] [address], [place], [place] [address]    Report Status PENDING  Incomplete  Culture, blood (x 2)     Status: None (Preliminary result)   Collection Time: 06/07/18  5:14 PM  Result Value Ref Range Status   Specimen Description   Final    BLOOD LEFT FOREARM Performed at [facility] [address] [place],  [place] [address]    Special Requests   Final    BOTTLES DRAWN AEROBIC AND ANAEROBIC Blood Culture adequate volume Performed at [place] [address], [place], [place] [address]    Culture   Final    NO GROWTH 4 DAYS Performed at [facility], [place] [address], [place], [place] [address]    Report Status PENDING  Incomplete         Radiology Studies: Dg Chest Port 1 View  Result Date: 06/12/2018 CLINICAL DATA:  Wheezing EXAM: PORTABLE CHEST 1 VIEW COMPARISON:  June 09, 2018 FINDINGS: There is a shallow degree of inspiration. There are small pleural effusions bilaterally. There is atelectatic change in the left base. Lungs elsewhere are clear. Heart is upper normal in size with pulmonary vascularity normal. No adenopathy. No bone lesions. IMPRESSION: Small pleural effusions bilaterally with left base atelectatic change. No frank consolidation. Note shallow degree of inspiration. Stable cardiac silhouette.  Electronically Signed   By: Lowella Grip III M.D.   On: 06/12/2018 09:08        Scheduled Meds: . cetirizine  10 mg Oral Daily  . diltiazem  90 mg Oral Q6H  . enoxaparin (LOVENOX) injection  40 mg Subcutaneous Q24H  . fluticasone  2 spray Each Nare QHS  . furosemide  40 mg Intravenous BID  . gabapentin  600 mg Oral QHS  . lactulose  10 g Oral TID  . mouth rinse  15 mL Mouth Rinse BID  . pantoprazole (PROTONIX) IV  40 mg Intravenous Q24H  . polyethylene glycol  17 g Oral BID  . tamsulosin  0.4 mg Oral QHS   Continuous Infusions: . ceFEPime (MAXIPIME) IV 2 g (06/12/18 0654)  . lactated ringers Stopped (06/12/18 0553)  . metronidazole 100 mL/hr at 06/12/18 0600     LOS: 6 days    Time spent: [place], MD [facility]  If 7PM-7AM, please contact night-coverage [email or web address] Password [facility] 06/12/2018, 9:29 AM

## 2018-06-13 ENCOUNTER — Inpatient Hospital Stay (HOSPITAL_COMMUNITY): Payer: Medicare Other

## 2018-06-13 ENCOUNTER — Encounter (HOSPITAL_COMMUNITY): Payer: Self-pay | Admitting: Radiology

## 2018-06-13 LAB — COMPREHENSIVE METABOLIC PANEL
ALT: 18 U/L (ref 0–44)
AST: 20 U/L (ref 15–41)
Albumin: 2.3 g/dL — ABNORMAL LOW (ref 3.5–5.0)
BUN: 17 mg/dL (ref 8–23)
CO2: 25 mmol/L (ref 22–32)
Chloride: 100 mmol/L (ref 98–111)
Creatinine, Ser: 0.96 mg/dL (ref 0.61–1.24)
GFR calc Af Amer: >60 mL/min (ref >60)
GFR calc non Af Amer: >60 mL/min (ref >60)
Glucose, Bld: 119 mg/dL — ABNORMAL HIGH (ref 70–99)
Potassium: 3.2 mmol/L — ABNORMAL LOW (ref 3.5–5.1)
Sodium: 131 mmol/L — ABNORMAL LOW (ref 135–145)
Total Bilirubin: 0.6 mg/dL (ref 0.3–1.2)
Total Protein: 5.2 g/dL — ABNORMAL LOW (ref 6.5–8.1)

## 2018-06-13 LAB — BODY FLUID CELL COUNT WITH DIFFERENTIAL
Lymphs, Fluid: 6 %
Monocyte-Macrophage-Serous Fluid: 42 % — ABNORMAL LOW (ref 50–90)
Neutrophil Count, Fluid: 52 % — ABNORMAL HIGH (ref 0–25)
Total Nucleated Cell Count, Fluid: 182 cu mm (ref 0–1000)

## 2018-06-13 LAB — COMPREHENSIVE METABOLIC PANEL WITH GFR
Alkaline Phosphatase: 77 U/L (ref 38–126)
Anion gap: 6 (ref 5–15)
Calcium: 7.2 mg/dL — ABNORMAL LOW (ref 8.9–10.3)

## 2018-06-13 LAB — CBC
HCT: 38.2 % — ABNORMAL LOW (ref 39.0–52.0)
Hemoglobin: 12.1 g/dL — ABNORMAL LOW (ref 13.0–17.0)
MCH: 28.4 pg (ref 26.0–34.0)
MCHC: 31.7 g/dL (ref 30.0–36.0)
MCV: 89.7 fL (ref 80.0–100.0)
Platelets: 223 10*3/uL (ref 150–400)
RBC: 4.26 MIL/uL (ref 4.22–5.81)
RDW: 13.4 % (ref 11.5–15.5)
WBC: 16.7 10*3/uL — ABNORMAL HIGH (ref 4.0–10.5)
nRBC: 0 % (ref 0.0–0.2)

## 2018-06-13 LAB — GLUCOSE, PLEURAL OR PERITONEAL FLUID: Glucose, Fluid: 164 mg/dL

## 2018-06-13 LAB — PROTEIN, PLEURAL OR PERITONEAL FLUID: Total protein, fluid: <3 g/dL

## 2018-06-13 LAB — AMYLASE, PLEURAL OR PERITONEAL FLUID: Amylase, Fluid: 63 U/L

## 2018-06-13 LAB — MAGNESIUM: Magnesium: 1.7 mg/dL (ref 1.7–2.4)

## 2018-06-13 LAB — LACTATE DEHYDROGENASE, PLEURAL OR PERITONEAL FLUID: LD, Fluid: 185 U/L — ABNORMAL HIGH (ref 3–23)

## 2018-06-13 LAB — PROCALCITONIN: Procalcitonin: 0.55 ng/mL

## 2018-06-13 MED ORDER — IOHEXOL 300 MG/ML  SOLN
75.0000 mL | Freq: Once | INTRAMUSCULAR | Status: AC | PRN
  Administered 2018-06-13: 75 mL via INTRAVENOUS

## 2018-06-13 MED ORDER — LIDOCAINE HCL 1 % IJ SOLN
INTRAMUSCULAR | Status: AC
  Administered 2018-06-13: 17:00:00
  Filled 2018-06-13: qty 10

## 2018-06-13 MED ORDER — POTASSIUM CHLORIDE CRYS ER 20 MEQ PO TBCR
40.0000 meq | EXTENDED_RELEASE_TABLET | ORAL | Status: AC
  Administered 2018-06-13 (×2): 40 meq via ORAL
  Filled 2018-06-13 (×2): qty 2

## 2018-06-13 MED ORDER — SODIUM CHLORIDE (PF) 0.9 % IJ SOLN
INTRAMUSCULAR | Status: AC
  Administered 2018-06-13: 10:00:00
  Filled 2018-06-13: qty 50

## 2018-06-13 MED ORDER — DIPHENHYDRAMINE HCL 25 MG PO CAPS
25.0000 mg | ORAL_CAPSULE | Freq: Four times a day (QID) | ORAL | Status: DC | PRN
  Administered 2018-06-13: 25 mg via ORAL
  Filled 2018-06-13: qty 1

## 2018-06-13 MED ORDER — FUROSEMIDE 10 MG/ML IJ SOLN: 40.0000 mg | Freq: Every day | INTRAMUSCULAR | Status: DC

## 2018-06-13 NOTE — Progress Notes (Signed)
Attending cardiology review of echo order:  Due to the spread of COVID-19, departmental policy is to review orders for procedures and determine appropriateness regarding testing at this time. The following criteria are being used to limit potential exposure and spread of infection.  Is the patient being evaluated for COVID-19 infection: No  Is the patient actively having infectious respiratory symptoms or fever: Wheezing Does the patient have a known history of prior cardiovascular disease: hypertension Would the test change management of the patient: currently diuresing Can the test be performed at a later time: Yes  Special circumstances: Is the patient undergoing evaluation for embolic CVA: No Is the patient planned for chemotherapy: No  Based on the review above, this study is not to be performed at this time. New LBBB without chest pain or elevated troponins. Possible volume overload in the setting of IV hydration for pancreatitis and holding HCTZ as potential cause. CXR with small pleural effusions bilaterally and atelectasis but normal pulmonary vascular pattern. BNP slightly elevated. Recommend outpatient echo unless patient's clinical situation changes.  Buford Dresser, MD, PhD [facility]  [address], [place], [place] [address] [phone]

## 2018-06-13 NOTE — Care Management Important Message (Signed)
Important Message  Patient Details  Name: Timothy Khan MRN: [id number] Date of Birth: [birth year]   Medicare Important Message Given:  Yes    Kerin Salen 06/13/2018, 10:47 AMImportant Message  Patient Details  Name: Timothy Khan MRN: [id number] Date of Birth: [birth year]   Medicare Important Message Given:  Yes    Kerin Salen 06/13/2018, 10:47 AM

## 2018-06-13 NOTE — Evaluation (Signed)
Clinical/Bedside Swallow Evaluation Patient Details  Name: BREKKEN BEACH MRN: [id number] Date of Birth: 1931-05-19  Today's Date: 06/13/2018 Time: SLP Start Time (ACUTE ONLY): 7829 SLP Stop Time (ACUTE ONLY): 1134 SLP Time Calculation (min) (ACUTE ONLY): 55 min  Past Medical History:  Past Medical History:  Diagnosis Date  . Abnormal brain MRI    Right side  . Basal cell carcinoma of back   . BPH (benign prostatic hyperplasia)   . Concussion 1950   Baseball injury  . Foot drop, bilateral 05/02/2013  . Gait disturbance   . Gastroesophageal reflux disease   . Hyperlipidemia   . Hypertension   . Lumbago   . Peripheral neuropathy   . Skull fracture [facility])    Past Surgical History:  Past Surgical History:  Procedure Laterality Date  . CATARACT EXTRACTION Bilateral   . ESOPHAGOGASTRODUODENOSCOPY (EGD) WITH PROPOFOL N/A 06/11/2018   Procedure: ESOPHAGOGASTRODUODENOSCOPY (EGD) WITH PROPOFOL;  Surgeon: Clarene Essex, MD;  Location: [facility] ENDOSCOPY;  Service: Endoscopy;  Laterality: N/A;  . KNEE SURGERY Left   . skin cancer resection     basal cell, [place] carcinoma  . TONSILLECTOMY    . VASECTOMY     HPI:  85 yo male adm to [facility] with abdomen pain, found to have pancreatitis.  Pt PMH + for ETOH use, GERD, LBBB, BPH, Hiatal hernia and esophageal thickening, peripheral neuropathy.  He was scheduled for potential paracentisis but fluid was not adequate amount to remove.  He has experienced dyspnea and was found today to have pleural effusion on imaging - concern with fluid overload noted.  Plan for potential thoracentesis noted.  CT neck negative today.  Swallow eval ordered due to pt overtly coughing when taking medicine.     Assessment / Plan / Recommendation Clinical Impression  Patient with negative cranial nerve exam.  Daughter Santiago Glad present during evaluation.  Pt without clinical indication of aspiration with intake observed.  He does admit to premorbid occasional difficulties  swallowing causing him to "choke" on liquids more than food.   He also advised he would not have alerted MD of "choking" on pills with liquids with nurse yesterday as this is "normal" for him.  Xerostomia also reported and pt with eyrthema in oral cavity.  Pt points to distal pharynx to indicate area of "stopping".  Educated extensively pt/Karen to aspiration mitigation strategies, xerostomia, respiration/swallow reciprocity and esophageal compensations.  Also provided written information re: heimlich manuever- which pt required "years ago" on "chicken".  If pt is having occasional aspiration, suspect it's source is likely esophageal.  Pt unable to participate in instrumental evaluation at this time due to his bowel issues - but suspect his distended abdomen may be contributing to his issue.  Advised pt/daughter to importance of oral care, brushing dentures and gums/tongue with soft bristle brush to remove biofilm.  Also reviewed importance of removing dentures NIGHTLY.  No SlP follow up as extensive education took place.  Thanks for this order.    SLP Visit Diagnosis: Dysphagia, unspecified (R13.10)    Aspiration Risk  Mild aspiration risk    Diet Recommendation Dysphagia 3 (Mech soft);Thin liquid   Liquid Administration via: Cup;Straw Medication Administration: Whole meds with puree(start and follow with liquids) Supervision: Patient able to self feed Compensations: Minimize environmental distractions;Slow rate;Small sips/bites(start meals with liquids, small frequent meals) Postural Changes: Seated upright at 90 degrees;Remain upright for at least 30 minutes after po intake    Other  Recommendations Oral Care Recommendations: Oral care QID  Follow up Recommendations None      Frequency and Duration            Prognosis        Swallow Study   General Date of Onset: 06/13/18 HPI: 85 yo male adm to [facility] with abdomen pain, found to have pancreatitis.  Pt PMH + for ETOH use, GERD, LBBB,  BPH, Hiatal hernia and esophageal thickening, peripheral neuropathy.  He was scheduled for potential paracentisis but fluid was not adequate amount to remove.  He has experienced dyspnea and was found today to have pleural effusion on imaging - concern with fluid overload noted.  Plan for potential thoracentesis noted.  CT neck negative today.  Swallow eval ordered due to pt overtly coughing when taking medicine.   Previous Swallow Assessment: endoscopy completed during this hospital coarse - benign stenosis, hiatal hernia *small Diet Prior to this Study: Dysphagia 3 (soft);Thin liquids Temperature Spikes Noted: No Respiratory Status: Nasal cannula(2 liters) History of Recent Intubation: No Behavior/Cognition: Alert;Cooperative;Pleasant mood Oral Cavity Assessment: Erythema;Other (comment)(minimal white patches on tongue did not fully clear with brushing, midline fissure) Oral Care Completed by SLP: Yes Oral Cavity - Dentition: Dentures, top;Dentures, bottom Vision: Functional for self-feeding Self-Feeding Abilities: Able to feed self Patient Positioning: Upright in bed Baseline Vocal Quality: Low vocal intensity Volitional Cough: Strong Volitional Swallow: Able to elicit    Oral/Motor/Sensory Function Overall Oral Motor/Sensory Function: Within functional limits   Ice Chips Ice chips: Not tested   Thin Liquid Thin Liquid: Within functional limits Presentation: Straw    Nectar Thick Nectar Thick Liquid: Not tested   Honey Thick Honey Thick Liquid: Not tested   Puree Puree: Not tested   Solid     Solid: Not tested      Macario Golds 06/13/2018,12:08 PM  Luanna Salk, [place] Pager [phone] Office [phone]

## 2018-06-13 NOTE — Progress Notes (Signed)
PROGRESS NOTE    Timothy Khan  NTZ:[id number] DOB: [birth year] DOA: 06/06/2018 PCP: [name], MD   Brief Narrative:  85 year old with past medical history relevant for hereditary peripheral neuropathy, hypertension, BPH admitted with nausea, vomiting, domino pain and found to have acute pancreatitis.  His hospital course has been complicated by wheezing and fluid overload.   Assessment & Plan:   Principal Problem:   Acute pancreatitis Active Problems:   Hereditary and idiopathic peripheral neuropathy   Hypertension   Foot drop, bilateral   BPH (benign prostatic hyperplasia)   LBBB (left bundle branch block)   GERD (gastroesophageal reflux disease)   Esophageal thickening   Hiatal hernia   #) Wheezing/fluid overload: Somewhat improved with diuresis but continues to have wheezing. -Strict ins and outs, weigh daily -continue IV furosemide 40 mg daily -Echo ordered -Continue PRN short-acting bronchodilators -We will check CT a of the head and neck to see if this is an upper airway obstruction  #) Abdominal distention: This is also likely contributing to his poor respiratory status.  Minimal ascites on ultrasound so no paracentesis was performed.  Abdominal x-ray shows evidence of colonic distention but no blockage. -We will discuss with GI about distention  #) Idiopathic pancreatitis: No evidence of gallstones on right upper quadrant ultrasound and no hypertriglyceridemia.  On review his medications there was a concern that HCTZ could cause pancreatitis and that this is being held. - Continue regular diet  #) Hypertension: - Hold HCTZ 12.5 mg daily - Continue losartan  50 mg daily  #) Hereditary neuropathy: -Continue gabapentin 600 mg nightly  #) BPH: -Continue tamsulosin 0.4 mg nightly  Fluids: Restrict Electrolytes: Monitor and supplement Nutrition: Regular diet  Prophylaxis: Enoxaparin  Disposition: Pending improved respiratory status and physical  therapy evaluation  Full code     Consultants:   Gastroenterology  Procedures:   Echo 06/12/2018: Pending  Antimicrobials:  Cefepime and metronidazole started 06/06/2020 ongoing   Subjective: This morning the patient reports he is sleepy.  He reports he did not sleep well last night due to significant noise.  He reports that his breathing got significantly worse last night but is improved this morning.  He also reports that he had some boiling in his stomach and some abdominal distention but denies any nausea, vomiting, diarrhea.  Objective: Vitals:   06/12/18 1112 06/12/18 1324 06/12/18 2140 06/13/18 0559  BP:  (!) 144/66 (!) 155/65 (!) 147/67  Pulse:  94 97 88  Resp:  16 16 16   Temp:  98.5 F (36.9 C) 99 F (37.2 C) 99.4 F (37.4 C)  TempSrc:  Oral Oral Oral  SpO2: 94% 95% 96% 95%  Weight:      Height:        Intake/Output Summary (Last 24 hours) at 06/13/2018 0856 Last data filed at 06/13/2018 6759 Gross per 24 hour  Intake 60 ml  Output 1375 ml  Net -1315 ml   Filed Weights   06/06/18 1800  Weight: 77.7 kg    Examination:  General exam: Appears calm and comfortable  Respiratory system: Minimally increased work of breathing, audible wheezing, scattered crackles, no rhonchi Cardiovascular system: Distant heart sounds, regular rate and rhythm, no murmurs Gastrointestinal system: Soft, tympanitic, distended but soft, hypoactive bowel sounds, no rebound or guarding Central nervous system: Alert and oriented.  Grossly intact, moving all extremities Extremities: 2+ lower extremity edema Skin: No rashes over visible skin Psychiatry: Judgement and insight appear normal. Mood & affect appropriate.  Data Reviewed: I have personally reviewed following labs and imaging studies  CBC: Recent Labs  Lab 06/07/18 1709 06/08/18 0509 06/09/18 0526 06/11/18 0445 06/13/18 0424  WBC 28.3* 24.4* 20.1* 20.5* 16.7*  NEUTROABS 25.9* 22.0* 18.1* 17.3*  --   HGB 14.8  13.3 12.5* 12.2* 12.1*  HCT 47.3 42.5 38.0* 38.4* 38.2*  MCV 92.4 92.6 88.6 90.6 89.7  PLT 231 205 178 194 485   Basic Metabolic Panel: Recent Labs  Lab 06/08/18 0509 06/09/18 0526 06/09/18 1523 06/11/18 0445 06/13/18 0424  NA 136 132* 128* 132* 131*  K 3.5 2.7* 3.2* 4.2 3.2*  CL 109 100 98 101 100  CO2 21* 22 22 25 25   GLUCOSE 130* 125* 182* 124* 119*  BUN 16 14 15 16 17   CREATININE 0.98 0.81 0.89 0.90 0.96  CALCIUM 6.8* 7.0* 7.1* 7.3* 7.2*  MG  --   --   --   --  1.7   GFR: Estimated Creatinine Clearance: 59.1 mL/min (by C-G formula based on SCr of 0.96 mg/dL). Liver Function Tests: Recent Labs  Lab 06/07/18 0428 06/07/18 1709 06/09/18 0526 06/11/18 0445 06/13/18 0424  AST 73* 47* 29 21 20   ALT 84* 65* 32 23 18  ALKPHOS 70 64 53 66 77  BILITOT 1.0 1.3* 1.1 0.7 0.6  PROT 6.3* 6.3* 5.7* 5.7* 5.2*  ALBUMIN 3.3* 3.2* 2.8* 2.8* 2.3*   Recent Labs  Lab 06/06/18 0905 06/11/18 0445  LIPASE 5,122* 36   No results for input(s): AMMONIA in the last 168 hours. Coagulation Profile: Recent Labs  Lab 06/07/18 1709  INR 1.2   Cardiac Enzymes: Recent Labs  Lab 06/06/18 0905  TROPONINI <0.03   BNP (last 3 results) No results for input(s): PROBNP in the last 8760 hours. HbA1C: No results for input(s): HGBA1C in the last 72 hours. CBG: Recent Labs  Lab 06/07/18 0805  GLUCAP 156*   Lipid Profile: No results for input(s): CHOL, HDL, LDLCALC, TRIG, CHOLHDL, LDLDIRECT in the last 72 hours. Thyroid Function Tests: No results for input(s): TSH, T4TOTAL, FREET4, T3FREE, THYROIDAB in the last 72 hours. Anemia Panel: No results for input(s): VITAMINB12, FOLATE, FERRITIN, TIBC, IRON, RETICCTPCT in the last 72 hours. Sepsis Labs: Recent Labs  Lab 06/07/18 1709 06/07/18 1935 06/08/18 0827 06/08/18 1131 06/12/18 0750 06/13/18 0424  PROCALCITON 0.35  --   --   --  0.55 0.55  LATICACIDVEN 2.4* 2.3* 1.2 1.4  --   --     Recent Results (from the past 240 hour(s))   Culture, blood (x 2)     Status: None   Collection Time: 06/07/18  5:09 PM  Result Value Ref Range Status   Specimen Description   Final    BLOOD LEFT ANTECUBITAL Performed at [facility], [place] [address], [place], [place] [address]    Special Requests   Final    BOTTLES DRAWN AEROBIC ONLY Blood Culture adequate volume Performed at [place] [address], [place], [place] [address]    Culture   Final    NO GROWTH 5 DAYS Performed at [facility], [place] [address] [place], [place] [address]    Report Status 06/12/2018 FINAL  Final  Culture, blood (x 2)     Status: None   Collection Time: 06/07/18  5:14 PM  Result Value Ref Range Status   Specimen Description   Final    BLOOD LEFT FOREARM Performed at [facility] [address]  [place], [place] [address]    Special Requests   Final    BOTTLES DRAWN AEROBIC AND ANAEROBIC Blood Culture adequate volume Performed at [place] [address], [place], [place] [address]    Culture   Final    NO GROWTH 5 DAYS Performed at [facility], [place] [address], [place], [place] [address]    Report Status 06/12/2018 FINAL  Final         Radiology Studies: Dg Chest Port 1 View  Result Date: 06/12/2018 CLINICAL DATA:  Wheezing EXAM: PORTABLE CHEST 1 VIEW COMPARISON:  June 09, 2018 FINDINGS: There is a shallow degree of inspiration. There are small pleural effusions bilaterally. There is atelectatic change in the left base. Lungs elsewhere are clear. Heart is upper normal in size with pulmonary vascularity normal. No adenopathy. No bone lesions. IMPRESSION: Small pleural effusions bilaterally with left base atelectatic change. No frank consolidation. Note shallow degree of inspiration. Stable cardiac silhouette. Electronically Signed   By: Lowella Grip III M.D.   On: 06/12/2018 09:08   Dg Abd 2 Views  Result Date: 06/12/2018 CLINICAL DATA:   Constipation and abdominal pain. EXAM: ABDOMEN - 2 VIEW COMPARISON:  CT 4 days ago 06/08/2018 FINDINGS: Supine and decubitus views obtained. No evidence of free air. Moderate volume of stool throughout the hepatic flexure, descending, and rectosigmoid colon. Mild gaseous distension of ascending and mid transverse colon. No small bowel dilatation. No radiopaque calculi. IMPRESSION: Moderate colonic stool burden with mild gaseous distension of ascending colon, nonobstructive. Stool distends the rectum. No evidence of free air or small bowel dilatation. Electronically Signed   By: Keith Rake M.D.   On: 06/12/2018 19:37   [name] Ascites (abdomen Limited)  Result Date: 06/12/2018 CLINICAL DATA:  Evaluate ascites and possible paracentesis. EXAM: LIMITED ABDOMEN ULTRASOUND FOR ASCITES TECHNIQUE: Limited ultrasound survey for ascites was performed in all four abdominal quadrants. COMPARISON:  CT 06/08/2018 FINDINGS: Minimal ascites in the right upper quadrant. Trace amount of ascites in the right lower quadrant. No significant ascites in the left abdomen. IMPRESSION: Very small amount of ascites in the right abdomen. Not enough fluid for a paracentesis. Electronically Signed   By: Markus Daft M.D.   On: 06/12/2018 14:48        Scheduled Meds: . cetirizine  10 mg Oral Daily  . diltiazem  90 mg Oral Q6H  . enoxaparin (LOVENOX) injection  40 mg Subcutaneous Q24H  . fluticasone  2 spray Each Nare QHS  . [START ON 06/14/2018] furosemide  40 mg Intravenous Daily  . gabapentin  600 mg Oral QHS  . lactulose  10 g Oral TID  . mouth rinse  15 mL Mouth Rinse BID  . pantoprazole  40 mg Oral QAC supper  . polyethylene glycol  17 g Oral BID  . potassium chloride  40 mEq Oral Daily  . potassium chloride  40 mEq Oral Q4H  . tamsulosin  0.4 mg Oral QHS   Continuous Infusions: . lactated ringers Stopped (06/12/18 0553)     LOS: 7 days    Time spent: [place], MD [facility]  If  7PM-7AM, please contact night-coverage [email or web address] Password [facility] 06/13/2018, 8:56 AM

## 2018-06-13 NOTE — Procedures (Signed)
Ultrasound-guided diagnostic and therapeutic right thoracentesis performed yielding 680 cc of yellow fluid. No immediate complications. Follow-up chest x-ray pending. A portion of the fluid was sent to the lab for preordered studies. EBL none.

## 2018-06-13 NOTE — Progress Notes (Signed)
Patient has been complaining of itching on his back, when assessing him, his back has red dots from the top to the bottom. Patient states that he has problems like this and usually uses a cream at home that his dermatologist recommended but couldn't remember what it was called. MD was notified and ordered benadryl to help with the itching. Will continue to monitor.  Timoteo Gaul, RN

## 2018-06-13 NOTE — Progress Notes (Signed)
[facility] Gastroenterology Progress Note  Timothy Khan 85 y.o. Sep 11, 1931   Subjective: Eating soft diet. Complains of abdominal pain and distention. BM X 2 overnight.  Objective: Vital signs: Vitals:   06/12/18 2140 06/13/18 0559  BP: (!) 155/65 (!) 147/67  Pulse: 97 88  Resp: 16 16  Temp: 99 F (37.2 C) 99.4 F (37.4 C)  SpO2: 96% 95%    Physical Exam: Gen: lethargic, no acute distress, elderly HEENT: anicteric sclera CV: RRR Chest: CTA B Abd: +distention, diffuse tenderness with minimal guarding, +BS Ext: no edema  Lab Results: Recent Labs    06/11/18 0445 06/13/18 0424  NA 132* 131*  K 4.2 3.2*  CL 101 100  CO2 25 25  GLUCOSE 124* 119*  BUN 16 17  CREATININE 0.90 0.96  CALCIUM 7.3* 7.2*  MG  --  1.7   Recent Labs    06/11/18 0445 06/13/18 0424  AST 21 20  ALT 23 18  ALKPHOS 66 77  BILITOT 0.7 0.6  PROT 5.7* 5.2*  ALBUMIN 2.8* 2.3*   Recent Labs    06/11/18 0445 06/13/18 0424  WBC 20.5* 16.7*  NEUTROABS 17.3*  --   HGB 12.2* 12.1*  HCT 38.4* 38.2*  MCV 90.6 89.7  PLT 194 223      Assessment/Plan: Acute pancreatitis with abd distention and volume overload. Possible ileus but moving bowels. Not enough ascites for paracentesis on U/S. Showing signs of improvement with an appetite and tolerating soft food. Continue IV diuresis per primary team. Continue Lactulose. No new GI recs.   Timothy Khan 06/13/2018, 10:12 AM  Questions please call [phone] ID: Timothy Khan, male   DOB: [birth year], 85 y.o.   MRN: [id number]

## 2018-06-13 NOTE — Evaluation (Signed)
Physical Therapy Evaluation Patient Details Name: Timothy Khan MRN: [id number] DOB: [birth year] Today's Date: 06/13/2018   History of Present Illness  85 yo male admitted with acute pancreatitis. Hx of fall-SDH, skull fx, scalp laceration, neuropathy, bil foot drop, LBBB.   Clinical Impression  On eval, pt required Mod assist for mobility. He walked ~50 feet with a RW. O2 sat dropped to 88% on RA with bed mobility so replaced [place] O2 for ambulation. Pt presents with general weakness, decreased activity tolerance, and impaired gait and balance. Discussed d/c plan with pt and daughter-pt plans to return home. He is agreeable to HHPT f/u. Will continue to follow and progress activity as tolerated.     Follow Up Recommendations Home health PT;Supervision/Assistance - 24 hour    Equipment Recommendations  None recommended by PT    Recommendations for Other Services       Precautions / Restrictions Precautions Precautions: Fall Precaution Comments: monitor O2; wears bil AFOs Restrictions Weight Bearing Restrictions: No      Mobility  Bed Mobility Overal bed mobility: Needs Assistance Bed Mobility: Supine to Sit     Supine to sit: HOB elevated;Mod assist     General bed mobility comments: Assist for trunk and LEs. Increased time. Task was effortful for pt. Audible wheezing. O2 dropped to 88% on RA. Replaced [place] O2 before ambulation  Transfers Overall transfer level: Needs assistance Equipment used: Rolling walker (2 wheeled) Transfers: Sit to/from Stand Sit to Stand: From elevated surface;Mod assist         General transfer comment: Assist to rise, stabilize, control descent. VCs safety, technique, hand placement.   Ambulation/Gait Min Assist Gait Distance (Feet): 50 Feet Assistive device: Rolling walker (2 wheeled) Gait Pattern/deviations: Step-through pattern;Decreased stride length     General Gait Details: Assist to stabilize pt and maneuver RW thoughout  distance. Very unsteady. O2 sat 90% on 3L [place] O2. Pt fatigues easily.   Stairs            Wheelchair Mobility    Modified Rankin (Stroke Patients Only)       Balance Overall balance assessment: Needs assistance         Standing balance support: Bilateral upper extremity supported Standing balance-Leahy Scale: Poor                               Pertinent Vitals/Pain Pain Assessment: Faces Faces Pain Scale: Hurts little more Pain Location: back Pain Descriptors / Indicators: Sore;Aching Pain Intervention(s): Monitored during session;Repositioned    Home Living Family/patient expects to be discharged to:: Private residence Living Arrangements: Spouse/significant other Available Help at Discharge: Family;Available PRN/intermittently Type of Home: House       Home Layout: Able to live on main level with bedroom/bathroom;Laundry or work area in [place]: Environmental consultant - 2 wheels;Cane - single point;Grab bars - tub/shower      Prior Function Level of Independence: Independent with assistive device(s)         Comments: uses cane PRN     Hand Dominance        Extremity/Trunk Assessment   Upper Extremity Assessment Upper Extremity Assessment: Generalized weakness    Lower Extremity Assessment Lower Extremity Assessment: Generalized weakness    Cervical / Trunk Assessment Cervical / Trunk Assessment: Normal  Communication   Communication: No difficulties  Cognition Arousal/Alertness: Awake/alert Behavior During Therapy: WFL for tasks assessed/performed Overall Cognitive Status: Within Functional Limits for tasks assessed  General Comments      Exercises     Assessment/Plan    PT Assessment Patient needs continued PT services  PT Problem List Decreased strength;Decreased balance;Decreased mobility;Decreased activity tolerance;Decreased knowledge of use of DME        PT Treatment Interventions DME instruction;Gait training;Therapeutic activities;Functional mobility training;Balance training;Patient/family education;Therapeutic exercise    PT Goals (Current goals can be found in the Care Plan section)  Acute Rehab PT Goals Patient Stated Goal: to get better PT Goal Formulation: With patient/family Time For Goal Achievement: 06/27/18 Potential to Achieve Goals: Good    Frequency Min 3X/week   Barriers to discharge        Co-evaluation               AM-PAC PT "6 Clicks" Mobility  Outcome Measure Help needed turning from your back to your side while in a flat bed without using bedrails?: A Little Help needed moving from lying on your back to sitting on the side of a flat bed without using bedrails?: A Lot Help needed moving to and from a bed to a chair (including a wheelchair)?: A Lot Help needed standing up from a chair using your arms (e.g., wheelchair or bedside chair)?: A Lot Help needed to walk in hospital room?: A Little Help needed climbing 3-5 steps with a railing? : A Lot 6 Click Score: 14    End of Session Equipment Utilized During Treatment: Gait belt;Oxygen Activity Tolerance: Patient limited by fatigue Patient left: in bed;with call bell/phone within reach;with family/visitor present   PT Visit Diagnosis: Muscle weakness (generalized) (M62.81);Pain;Difficulty in walking, not elsewhere classified (R26.2);History of falling (Z91.81) Pain - part of body: (back)    Time: 2119-4174 PT Time Calculation (min) (ACUTE ONLY): 17 min   Charges:   PT Evaluation $PT Eval Moderate Complexity: [place], PT Acute Rehabilitation Services Pager: [phone] Office: [phone]

## 2018-06-14 LAB — BASIC METABOLIC PANEL WITH GFR
CO2: 25 mmol/L (ref 22–32)
GFR calc Af Amer: >60 mL/min (ref >60)

## 2018-06-14 LAB — PH, BODY FLUID: pH, Body Fluid: 7.5

## 2018-06-14 LAB — BASIC METABOLIC PANEL
Anion gap: 7 (ref 5–15)
BUN: 20 mg/dL (ref 8–23)
Calcium: 7.6 mg/dL — ABNORMAL LOW (ref 8.9–10.3)
Chloride: 103 mmol/L (ref 98–111)
Creatinine, Ser: 1.02 mg/dL (ref 0.61–1.24)
GFR calc non Af Amer: >60 mL/min (ref >60)
Glucose, Bld: 119 mg/dL — ABNORMAL HIGH (ref 70–99)
Potassium: 4.1 mmol/L (ref 3.5–5.1)
Sodium: 135 mmol/L (ref 135–145)

## 2018-06-14 LAB — PROCALCITONIN: Procalcitonin: 0.44 ng/mL

## 2018-06-14 MED ORDER — LOSARTAN POTASSIUM 50 MG PO TABS
50.0000 mg | ORAL_TABLET | Freq: Every day | ORAL | Status: DC
  Administered 2018-06-14 – 2018-06-15 (×2): 50 mg via ORAL
  Filled 2018-06-14 (×2): qty 1

## 2018-06-14 MED ORDER — FUROSEMIDE 10 MG/ML IJ SOLN
40.0000 mg | Freq: Two times a day (BID) | INTRAMUSCULAR | Status: DC
  Administered 2018-06-14 – 2018-06-15 (×3): 40 mg via INTRAVENOUS
  Filled 2018-06-14 (×3): qty 4

## 2018-06-14 MED ORDER — VITAMIN D 25 MCG (1000 UNIT) PO TABS
2000.0000 [IU] | ORAL_TABLET | ORAL | Status: DC
  Administered 2018-06-14: 2000 [IU] via ORAL
  Filled 2018-06-14: qty 2

## 2018-06-14 MED ORDER — ADULT MULTIVITAMIN W/MINERALS CH
1.0000 | ORAL_TABLET | Freq: Every day | ORAL | Status: DC
  Administered 2018-06-14 – 2018-06-15 (×2): 1 via ORAL
  Filled 2018-06-14 (×2): qty 1

## 2018-06-14 MED ORDER — PANTOPRAZOLE SODIUM 40 MG PO TBEC: 40.0000 mg | DELAYED_RELEASE_TABLET | Freq: Every day | ORAL | Status: DC

## 2018-06-14 MED ORDER — ALUM & MAG HYDROXIDE-SIMETH 200-200-20 MG/5ML PO SUSP
30.0000 mL | ORAL | Status: DC | PRN
  Administered 2018-06-14: 30 mL via ORAL
  Filled 2018-06-14: qty 30

## 2018-06-14 MED ORDER — TRAZODONE HCL 50 MG PO TABS
50.0000 mg | ORAL_TABLET | Freq: Every evening | ORAL | Status: DC | PRN
  Administered 2018-06-14: 50 mg via ORAL
  Filled 2018-06-14: qty 1

## 2018-06-14 MED ORDER — CO Q 10 10 MG PO CAPS: 1.0000 | ORAL_CAPSULE | ORAL | Status: DC

## 2018-06-14 NOTE — Progress Notes (Signed)
Physical Therapy Treatment Patient Details Name: Timothy Khan MRN: [id number] DOB: 03/05/1932 Today's Date: 06/14/2018    History of Present Illness 85 yo male admitted with acute pancreatitis. Hx of fall-SDH, skull fx, scalp laceration, neuropathy, bil foot drop, LBBB.     PT Comments    Pt progressing toward PT goals; He is slightly lethargic today but more awake with initiation of mobility; Pt able to amb a greater distance today;   lengthy discussion with dtr regarding d/c plan; Pt dtr  Stated initially that she wanted pt to go home, then SNF and after our discussion she states that she prefers pt to go home, she will be there to care for pt (24/7 if needed); we have discussed at length what this means with regard to mobility and safety, dtr verbalizes understanding; recommend HHPT, [place] and dtr requests [facility] aide as well. Will continue to follow in acute setting;  Follow Up Recommendations  Home health PT;Supervision/Assistance - 24 hour     Equipment Recommendations  None recommended by PT    Recommendations for Other Services       Precautions / Restrictions Precautions Precautions: Fall Restrictions Weight Bearing Restrictions: No    Mobility  Bed Mobility Overal bed mobility: Needs Assistance Bed Mobility: Supine to Sit     Supine to sit: HOB elevated;Mod assist     General bed mobility comments: assist for trunk and LEs, incr time needed, effortful transition  Transfers Overall transfer level: Needs assistance Equipment used: Rolling walker (2 wheeled) Transfers: Sit to/from Stand Sit to Stand: Min assist;From elevated surface         General transfer comment: Assist to rise, stabilize, control descent. VCs safety, technique, hand placement.   Ambulation/Gait Ambulation/Gait assistance: Min assist Gait Distance (Feet): 85 Feet Assistive device: Rolling walker (2 wheeled) Gait Pattern/deviations: Step-through pattern;Decreased stride length      General Gait Details: Assist to stabilize pt and maneuver RW thoughout distance, unsteady but without overt LOB   Stairs             Wheelchair Mobility    Modified Rankin (Stroke Patients Only)       Balance                                            Cognition Arousal/Alertness: Awake/alert Behavior During Therapy: WFL for tasks assessed/performed Overall Cognitive Status: Within Functional Limits for tasks assessed                                        Exercises      General Comments        Pertinent Vitals/Pain Pain Assessment: No/denies pain Pain Intervention(s): Monitored during session    Home Living                      Prior Function            PT Goals (current goals can now be found in the care plan section) Acute Rehab PT Goals Patient Stated Goal: to get better PT Goal Formulation: With patient/family Time For Goal Achievement: 06/27/18 Potential to Achieve Goals: Good Progress towards PT goals: Progressing toward goals    Frequency    Min 3X/week      PT Plan Current  plan remains appropriate    Co-evaluation              AM-PAC PT "6 Clicks" Mobility   Outcome Measure  Help needed turning from your back to your side while in a flat bed without using bedrails?: A Little Help needed moving from lying on your back to sitting on the side of a flat bed without using bedrails?: A Lot Help needed moving to and from a bed to a chair (including a wheelchair)?: A Little Help needed standing up from a chair using your arms (e.g., wheelchair or bedside chair)?: A Little Help needed to walk in hospital room?: A Little Help needed climbing 3-5 steps with a railing? : A Lot 6 Click Score: 16    End of Session Equipment Utilized During Treatment: Gait belt Activity Tolerance: Patient tolerated treatment well Patient left: in chair;with family/visitor present;with call bell/phone within  reach Nurse Communication: Mobility status PT Visit Diagnosis: Muscle weakness (generalized) (M62.81);Pain;Difficulty in walking, not elsewhere classified (R26.2);History of falling (Z91.81)     Time: 2458-0998 PT Time Calculation (min) (ACUTE ONLY): 42 min  Charges:  $Gait Training: 23-37 mins $Therapeutic Activity: 8-22 mins                     Kenyon Ana, PT  Pager: [phone] Acute Rehab Dept [facility]): [phone]   06/14/2018    [facility] 06/14/2018, 1:22 PM

## 2018-06-14 NOTE — TOC Initial Note (Signed)
Transition of Care [facility]) - Initial/Assessment Note    Patient Details  Name: Timothy Khan MRN: [id number] Date of Birth: 13-Sep-1931  Transition of Care [facility]) CM/SW Contact:    Dessa Phi, RN Phone Number: 06/14/2018, 12:15 PM  Clinical Narrative:  Patient defers to dtr-Karen-Spoke to dtr in rm;Malinda dtr also on speaker phone-discussed d/c plans-family aware of recc SNF,but wants to d/c home w/HHC-[facility] agency chosen rep [facility] aware.HHRN/PT/OT/aide/csw recc. Family able to transport home. Await d/c orders.              Expected Discharge Plan: [place] Barriers to Discharge: No Barriers Identified   Patient Goals and CMS Choice Patient states their goals for this hospitalization and ongoing recovery are:: (go home) CMS Medicare.gov Compare Post Acute Care list provided to:: Patient Represenative (must comment)(dtr-Karen) Choice offered to / list presented to : Adult Children  Expected Discharge Plan and Services Expected Discharge Plan: [place]   Discharge Planning Services: CM Consult   Living arrangements for the past 2 months: Single Family Home Expected Discharge Date: 06/09/18                   HH Arranged: RN, PT, OT, Nurse's Aide, Social Work [employer] Agency: [place]  Prior Living Arrangements/Services Living arrangements for the past 2 months: [place] with:: Spouse, Adult Children Patient language and need for interpreter reviewed:: Yes Do you feel safe going back to the place where you live?: Yes      Need for Family Participation in Patient Care: No (Comment) Care giver support system in place?: Yes (comment) Current home services: DME(cane,rw,3n1) Criminal Activity/Legal Involvement Pertinent to Current Situation/Hospitalization: No - Comment as needed  Activities of Daily Living Home Assistive Devices/Equipment: Cane (specify quad or straight) ADL Screening (condition at time  of admission) Patient's cognitive ability adequate to safely complete daily activities?: Yes Is the patient deaf or have difficulty hearing?: No Does the patient have difficulty seeing, even when wearing glasses/contacts?: No Does the patient have difficulty concentrating, remembering, or making decisions?: No Patient able to express need for assistance with ADLs?: Yes Does the patient have difficulty dressing or bathing?: No Independently performs ADLs?: Yes (appropriate for developmental age) Does the patient have difficulty walking or climbing stairs?: Yes Weakness of Legs: Both(bilateral foot drop-neuropathy) Weakness of Arms/Hands: None  Permission Sought/Granted Permission sought to share information with : Case Manager Permission granted to share information with : Yes, Verbal Permission Granted  Share Information with NAME: Santiago Glad dtr)  Permission granted to share info w AGENCY: [facility])  Permission granted to share info w Relationship: (dtr)  Permission granted to share info w Contact Information: [phone])  Emotional Assessment Appearance:: Appears stated age   Affect (typically observed): Accepting Orientation: : Oriented to Self, Oriented to Place, Oriented to Situation Alcohol / Substance Use: Never Used Psych Involvement: No (comment)  Admission diagnosis:  Acute pancreatitis, unspecified complication status, unspecified pancreatitis type [K85.90] Patient Active Problem List   Diagnosis Date Noted  . Acute pancreatitis 06/06/2018  . BPH (benign prostatic hyperplasia) 06/06/2018  . LBBB (left bundle branch block) 06/06/2018  . GERD (gastroesophageal reflux disease) 06/06/2018  . Esophageal thickening 06/06/2018  . Hiatal hernia 06/06/2018  . Foot drop, bilateral 05/02/2013  . SDH (subdural hematoma) ([place]) 04/30/2013  . Polyneuropathy 04/30/2013  . Skull fracture ([place]) 04/30/2013  . Thoracic or lumbosacral neuritis or radiculitis, unspecified 03/18/2012   .  Hereditary and idiopathic peripheral neuropathy 03/18/2012  . Abnormality of gait 03/18/2012  . Concussion with no loss of consciousness 03/18/2012  . Other and unspecified hyperlipidemia 03/18/2012  . Hypertension 03/18/2012   PCP:  [name], MD Pharmacy:   [place] [address] [place] - [address] [place] [address] Phone: [phone] Fax: [phone]     Social Determinants of Health (SDOH) Interventions    Readmission Risk Interventions No flowsheet data found.

## 2018-06-14 NOTE — Progress Notes (Signed)
PROGRESS NOTE    Timothy Khan  WCH:[id number] DOB: April 06, 1931 DOA: 06/06/2018 PCP: [name], MD   Brief Narrative:  85 year old with past medical history relevant for hereditary peripheral neuropathy, hypertension, BPH admitted with nausea, vomiting, domino pain and found to have acute pancreatitis.  His hospital course has been complicated by wheezing and fluid overload.   Assessment & Plan:   Principal Problem:   Acute pancreatitis Active Problems:   Hereditary and idiopathic peripheral neuropathy   Hypertension   Foot drop, bilateral   BPH (benign prostatic hyperplasia)   LBBB (left bundle branch block)   GERD (gastroesophageal reflux disease)   Esophageal thickening   Hiatal hernia   #) Wheezing/fluid overload: Improved with diuresis and thoracentesis -Thoracentesis performed on 06/14/2018, studies pending but appears to be inflammatory/exudative likely secondary to pancreatitis.  CTA of the head and neck showed no evidence of upper airway obstruction but did show pleural effusions. -Strict ins and outs, weigh daily -continue IV furosemide 40 mg twice daily -Echo will not be performed due to recent coded -Continue PRN short-acting bronchodilators  #) Low-grade temperature: Patient continues to have temperatures of the daughter reports are higher than his baseline around 99-100.  He has not had any frank fever.  His procalcitonin appears to have peaked at 0.55 and then decreased 0.44.  This is off antibiotics. -Hold antibiotics -We will follow-up pleural cultures but low suspicion for infection  #) Abdominal distention: Improving, patient continues to have bowel movements and pass flatus  #) Idiopathic pancreatitis: No evidence of gallstones on right upper quadrant ultrasound and no hypertriglyceridemia.  On review his medications there was a concern that HCTZ could cause pancreatitis and that this is being held. - Continue regular diet, resolved  #)  Hypertension: - Hold HCTZ 12.5 mg daily - Continue losartan  50 mg daily  #) Hereditary neuropathy: -Continue gabapentin 600 mg nightly  #) BPH: -Continue tamsulosin 0.4 mg nightly  Fluids: Restrict Electrolytes: Monitor and supplement Nutrition: Regular diet  Prophylaxis: Enoxaparin  Disposition: Pending further physical therapy evaluation  Full code     Consultants:   Gastroenterology  Procedures:   None  Antimicrobials:  Cefepime and metronidazole started 06/06/2020 to 06/12/2018   Subjective: This morning the patient reports he is is tired.  He denies any nausea, vomiting, diarrhea, cough, congestion, rhinorrhea.  He continues to report significant dyspnea on exertion when going to the bathroom.  Objective: Vitals:   06/13/18 1440 06/13/18 1445 06/13/18 2032 06/14/18 0527  BP: (!) 164/74 (!) 150/72 (!) 162/68 122/77  Pulse:   94 85  Resp:   18 20  Temp:   100 F (37.8 C) 99.5 F (37.5 C)  TempSrc:   Oral Oral  SpO2:   97% 97%  Weight:      Height:        Intake/Output Summary (Last 24 hours) at 06/14/2018 0841 Last data filed at 06/13/2018 1500 Gross per 24 hour  Intake 300 ml  Output 100 ml  Net 200 ml   Filed Weights   06/06/18 1800  Weight: 77.7 kg    Examination:  General exam: Appears calm and comfortable  Respiratory system: No increased work of breathing, diminished lung sounds at bases, no rhonchi, no crackles Cardiovascular system: Distant heart sounds, regular rate and rhythm, no murmurs Gastrointestinal system: Soft, tympanitic, distended but soft, hypoactive bowel sounds, no rebound or guarding Central nervous system: Alert and oriented.  Grossly intact, moving all extremities Extremities: 1+ lower extremity edema  Skin: No rashes over visible skin Psychiatry: Judgement and insight appear normal. Mood & affect appropriate.     Data Reviewed: I have personally reviewed following labs and imaging studies  CBC: Recent Labs   Lab 06/07/18 1709 06/08/18 0509 06/09/18 0526 06/11/18 0445 06/13/18 0424  WBC 28.3* 24.4* 20.1* 20.5* 16.7*  NEUTROABS 25.9* 22.0* 18.1* 17.3*  --   HGB 14.8 13.3 12.5* 12.2* 12.1*  HCT 47.3 42.5 38.0* 38.4* 38.2*  MCV 92.4 92.6 88.6 90.6 89.7  PLT 231 205 178 194 542   Basic Metabolic Panel: Recent Labs  Lab 06/09/18 0526 06/09/18 1523 06/11/18 0445 06/13/18 0424 06/14/18 0409  NA 132* 128* 132* 131* 135  K 2.7* 3.2* 4.2 3.2* 4.1  CL 100 98 101 100 103  CO2 22 22 25 25 25   GLUCOSE 125* 182* 124* 119* 119*  BUN 14 15 16 17 20   CREATININE 0.81 0.89 0.90 0.96 1.02  CALCIUM 7.0* 7.1* 7.3* 7.2* 7.6*  MG  --   --   --  1.7  --    GFR: Estimated Creatinine Clearance: 55.7 mL/min (by C-G formula based on SCr of 1.02 mg/dL). Liver Function Tests: Recent Labs  Lab 06/07/18 1709 06/09/18 0526 06/11/18 0445 06/13/18 0424  AST 47* 29 21 20   ALT 65* 32 23 18  ALKPHOS 64 53 66 77  BILITOT 1.3* 1.1 0.7 0.6  PROT 6.3* 5.7* 5.7* 5.2*  ALBUMIN 3.2* 2.8* 2.8* 2.3*   Recent Labs  Lab 06/11/18 0445  LIPASE 36   No results for input(s): AMMONIA in the last 168 hours. Coagulation Profile: Recent Labs  Lab 06/07/18 1709  INR 1.2   Cardiac Enzymes: No results for input(s): CKTOTAL, CKMB, CKMBINDEX, TROPONINI in the last 168 hours. BNP (last 3 results) No results for input(s): PROBNP in the last 8760 hours. HbA1C: No results for input(s): HGBA1C in the last 72 hours. CBG: No results for input(s): GLUCAP in the last 168 hours. Lipid Profile: No results for input(s): CHOL, HDL, LDLCALC, TRIG, CHOLHDL, LDLDIRECT in the last 72 hours. Thyroid Function Tests: No results for input(s): TSH, T4TOTAL, FREET4, T3FREE, THYROIDAB in the last 72 hours. Anemia Panel: No results for input(s): VITAMINB12, FOLATE, FERRITIN, TIBC, IRON, RETICCTPCT in the last 72 hours. Sepsis Labs: Recent Labs  Lab 06/07/18 1709 06/07/18 1935 06/08/18 0827 06/08/18 1131 06/12/18 0750 06/13/18  0424 06/14/18 0409  PROCALCITON 0.35  --   --   --  0.55 0.55 0.44  LATICACIDVEN 2.4* 2.3* 1.2 1.4  --   --   --     Recent Results (from the past 240 hour(s))  Culture, blood (x 2)     Status: None   Collection Time: 06/07/18  5:09 PM  Result Value Ref Range Status   Specimen Description   Final    BLOOD LEFT ANTECUBITAL Performed at [facility], [place] [address], [place], [place] [address]    Special Requests   Final    BOTTLES DRAWN AEROBIC ONLY Blood Culture adequate volume Performed at [facility] [address], [place], [place] [address]    Culture   Final    NO GROWTH 5 DAYS Performed at [facility], [place] [address] [place], [place] [address]    Report Status 06/12/2018 FINAL  Final  Culture, blood (x 2)     Status: None   Collection Time: 06/07/18  5:14 PM  Result Value Ref Range Status   Specimen Description   Final  BLOOD LEFT FOREARM Performed at [facility] [place] [address] [place], [place] [address]    Special Requests   Final    BOTTLES DRAWN AEROBIC AND ANAEROBIC Blood Culture adequate volume Performed at [place] [address], [place], [place] [address]    Culture   Final    NO GROWTH 5 DAYS Performed at [facility], [place] [address] [place], [place] [address]    Report Status 06/12/2018 FINAL  Final  Body fluid culture     Status: None (Preliminary result)   Collection Time: 06/13/18  2:43 PM  Result Value Ref Range Status   Specimen Description FLUID ASCITIC  Final   Special Requests NONE  Final   Gram Stain   Final    MODERATE WBC PRESENT,BOTH PMN AND MONONUCLEAR NO ORGANISMS SEEN    Culture   Final    NO GROWTH < 12 HOURS Performed at [facility], [place] [address] [place], [place] [address]    Report Status PENDING  Incomplete         Radiology Studies: Dg Chest 1 View  Result Date: 06/13/2018 CLINICAL DATA:  S/p right-sided  thoracentesis. EXAM: CHEST  1 VIEW COMPARISON:  06/12/2018 FINDINGS: Decreased opacity is noted at the right lung base following right-sided thoracentesis. Residual opacity consistent with atelectasis and minimal pleural fluid. No pneumothorax. Persistent left lung base opacity is noted consistent with a left pleural effusion with associated atelectasis. Remainder of the lungs is clear. Cardiac silhouette is normal in size. No mediastinal or hilar masses. IMPRESSION: 1. Decrease in right lung base opacity following thoracentesis. No pneumothorax. 2. No other change from the previous day's study. Electronically Signed   By: Lajean Manes M.D.   On: 06/13/2018 15:11   Ct Soft Tissue Neck W Contrast  Result Date: 06/13/2018 CLINICAL DATA:  85 year old with wheezing and dyspnea. Evaluate for upper airway obstruction. EXAM: CT NECK WITH CONTRAST TECHNIQUE: Multidetector CT imaging of the neck was performed using the standard protocol following the bolus administration of intravenous contrast. CONTRAST:  54mL OMNIPAQUE IOHEXOL 300 MG/ML  SOLN COMPARISON:  None. FINDINGS: Pharynx and larynx: Normal. No mass or swelling. Salivary glands: No inflammation, mass, or stone. Thyroid: Normal. Lymph nodes: None enlarged or abnormal density. Vascular: Negative.  Incidental dominant LEFT internal jugular. Limited intracranial: Negative. Visualized orbits: Negative. Mastoids and visualized paranasal sinuses: Clear. Skeleton: No acute or aggressive process. Upper chest: BILATERAL pleural effusions, RIGHT greater than LEFT. Granulomatous calcification of the hilar and mediastinal lymph nodes. Other: None. IMPRESSION: Unremarkable CT neck with contrast. No evidence of upper airway obstruction. BILATERAL pleural effusions, RIGHT greater than LEFT. Electronically Signed   By: Staci Righter M.D.   On: 06/13/2018 10:21   Dg Chest Port 1 View  Result Date: 06/12/2018 CLINICAL DATA:  Wheezing EXAM: PORTABLE CHEST 1 VIEW COMPARISON:   June 09, 2018 FINDINGS: There is a shallow degree of inspiration. There are small pleural effusions bilaterally. There is atelectatic change in the left base. Lungs elsewhere are clear. Heart is upper normal in size with pulmonary vascularity normal. No adenopathy. No bone lesions. IMPRESSION: Small pleural effusions bilaterally with left base atelectatic change. No frank consolidation. Note shallow degree of inspiration. Stable cardiac silhouette. Electronically Signed   By: Lowella Grip III M.D.   On: 06/12/2018 09:08   Dg Abd 2 Views  Result Date: 06/12/2018 CLINICAL DATA:  Constipation and abdominal pain. EXAM: ABDOMEN - 2 VIEW COMPARISON:  CT  4 days ago 06/08/2018 FINDINGS: Supine and decubitus views obtained. No evidence of free air. Moderate volume of stool throughout the hepatic flexure, descending, and rectosigmoid colon. Mild gaseous distension of ascending and mid transverse colon. No small bowel dilatation. No radiopaque calculi. IMPRESSION: Moderate colonic stool burden with mild gaseous distension of ascending colon, nonobstructive. Stool distends the rectum. No evidence of free air or small bowel dilatation. Electronically Signed   By: Keith Rake M.D.   On: 06/12/2018 19:37   [name] Ascites (abdomen Limited)  Result Date: 06/12/2018 CLINICAL DATA:  Evaluate ascites and possible paracentesis. EXAM: LIMITED ABDOMEN ULTRASOUND FOR ASCITES TECHNIQUE: Limited ultrasound survey for ascites was performed in all four abdominal quadrants. COMPARISON:  CT 06/08/2018 FINDINGS: Minimal ascites in the right upper quadrant. Trace amount of ascites in the right lower quadrant. No significant ascites in the left abdomen. IMPRESSION: Very small amount of ascites in the right abdomen. Not enough fluid for a paracentesis. Electronically Signed   By: Markus Daft M.D.   On: 06/12/2018 14:48   US Thoracentesis Asp Pleural Space W/img Guide  Result Date: 06/13/2018 INDICATION: Patient with history of  abdominal pain, pancreatitis, dyspnea, right greater than left pleural effusions. Request made for diagnostic and therapeutic right thoracentesis. EXAM: ULTRASOUND GUIDED DIAGNOSTIC AND THERAPEUTIC RIGHT THORACENTESIS MEDICATIONS: None COMPLICATIONS: None immediate. PROCEDURE: An ultrasound guided thoracentesis was thoroughly discussed with the patient and questions answered. The benefits, risks, alternatives and complications were also discussed. The patient understands and wishes to proceed with the procedure. Written consent was obtained. Ultrasound was performed to localize and mark an adequate pocket of fluid in the right chest. The area was then prepped and draped in the normal sterile fashion. 1% Lidocaine was used for local anesthesia. Under ultrasound guidance a 6 Fr Safe-T-Centesis catheter was introduced. Thoracentesis was performed. The catheter was removed and a dressing applied. FINDINGS: A total of approximately 680 cc of yellow fluid was removed. Samples were sent to the laboratory as requested by the clinical team. IMPRESSION: Successful ultrasound guided diagnostic and therapeutic right thoracentesis yielding 680 cc of pleural fluid. Read by: Rowe Robert, PA-C Electronically Signed   By: Markus Daft M.D.   On: 06/13/2018 15:05        Scheduled Meds: . cetirizine  10 mg Oral Daily  . diltiazem  90 mg Oral Q6H  . enoxaparin (LOVENOX) injection  40 mg Subcutaneous Q24H  . fluticasone  2 spray Each Nare QHS  . furosemide  40 mg Intravenous BID  . gabapentin  600 mg Oral QHS  . lactulose  10 g Oral TID  . mouth rinse  15 mL Mouth Rinse BID  . pantoprazole  40 mg Oral QAC supper  . polyethylene glycol  17 g Oral BID  . potassium chloride  40 mEq Oral Daily  . tamsulosin  0.4 mg Oral QHS   Continuous Infusions: . lactated ringers 50 mL/hr at 06/14/18 0659     LOS: 8 days    Time spent: Wright, MD [facility]  If 7PM-7AM, please contact  night-coverage [email or web address] Password [facility] 06/14/2018, 8:41 AM

## 2018-06-14 NOTE — Progress Notes (Signed)
PHARMACIST - PHYSICIAN ORDER COMMUNICATION  CONCERNING: P&T Medication Policy on Herbal Medications  DESCRIPTION:    This patient's order for:  CoQ-10  has been noted.  This product(s) is classified as an "herbal" or natural product. Due to a lack of definitive safety studies or FDA approval, nonstandard manufacturing practices, plus the potential risk of unknown drug-drug interactions while on inpatient medications, the Pharmacy and Therapeutics Committee does not permit the use of "herbal" or natural products of this type within [facility].   ACTION TAKEN: The pharmacy department is unable to verify this order at this time and the order has been discontinued. Please reevaluate patient's clinical condition at discharge and address if the herbal or natural product(s) should be resumed at that time.    Royetta Asal, PharmD, BCPS Pager [phone] 06/14/2018 8:46 AM

## 2018-06-14 NOTE — Progress Notes (Signed)
Valentina Gu Irigoyen 9:26 AM  Subjective: Patient doing better from a GI standpoint although he is a little lethargic today and case discussed again with his daughter and he is about to eat regular food and has been moving his bowels better and tolerated the thoracentesis better and his breathing is better and he has no other complaints  Objective: Vital signs stable afebrile no acute distress abdomen is soft nontender labs okay  Assessment: Pancreatitis currently improved  Plan: Please call us if we could be of any further assistance with this hospital stay otherwise either myself or Dr. Therisa Doyne are happy to see back in a few weeks or if he is doing well can be put off until after the viral scare and we discussed pump inhibitors MiraLAX and low-fat diet at home with the daughter  [facility]  Pager [phone] After 5PM or if no answer call [phone]

## 2018-06-15 LAB — CBC
HCT: 38.3 % — ABNORMAL LOW (ref 39.0–52.0)
Hemoglobin: 11.9 g/dL — ABNORMAL LOW (ref 13.0–17.0)
MCH: 28.4 pg (ref 26.0–34.0)
MCHC: 31.1 g/dL (ref 30.0–36.0)
MCV: 91.4 fL (ref 80.0–100.0)
Platelets: 296 K/uL (ref 150–400)
RBC: 4.19 MIL/uL — ABNORMAL LOW (ref 4.22–5.81)
RDW: 13.5 % (ref 11.5–15.5)
WBC: 15.9 10*3/uL — ABNORMAL HIGH (ref 4.0–10.5)
nRBC: 0 % (ref 0.0–0.2)

## 2018-06-15 LAB — BASIC METABOLIC PANEL
Anion gap: 8 (ref 5–15)
BUN: 20 mg/dL (ref 8–23)
CO2: 28 mmol/L (ref 22–32)
Calcium: 7.5 mg/dL — ABNORMAL LOW (ref 8.9–10.3)
Chloride: 99 mmol/L (ref 98–111)
Creatinine, Ser: 1.02 mg/dL (ref 0.61–1.24)
GFR calc Af Amer: >60 mL/min (ref >60)
Glucose, Bld: 118 mg/dL — ABNORMAL HIGH (ref 70–99)
Sodium: 135 mmol/L (ref 135–145)

## 2018-06-15 LAB — PATHOLOGIST SMEAR REVIEW: Path Review: UNDETERMINED

## 2018-06-15 LAB — BASIC METABOLIC PANEL WITH GFR
GFR calc non Af Amer: >60 mL/min (ref >60)
Potassium: 3.5 mmol/L (ref 3.5–5.1)

## 2018-06-15 MED ORDER — ALBUTEROL SULFATE HFA 108 (90 BASE) MCG/ACT IN AERS: 2.0000 | INHALATION_SPRAY | Freq: Four times a day (QID) | RESPIRATORY_TRACT | 2 refills | Status: DC | PRN

## 2018-06-15 MED ORDER — TRAZODONE HCL 50 MG PO TABS: 50.0000 mg | ORAL_TABLET | Freq: Every evening | ORAL | 0 refills | Status: DC | PRN

## 2018-06-15 MED ORDER — FUROSEMIDE 40 MG PO TABS: 40.0000 mg | ORAL_TABLET | Freq: Every day | ORAL | 1 refills | Status: DC | PRN

## 2018-06-15 MED ORDER — TRAMADOL HCL 50 MG PO TABS: 100.0000 mg | ORAL_TABLET | Freq: Every day | ORAL | 0 refills | Status: DC

## 2018-06-15 MED ORDER — POLYETHYLENE GLYCOL 3350 17 G PO PACK: 17.0000 g | PACK | Freq: Two times a day (BID) | ORAL | 0 refills | Status: DC

## 2018-06-15 MED ORDER — LACTULOSE 10 GM/15ML PO SOLN: 10.0000 g | Freq: Three times a day (TID) | ORAL | 0 refills | Status: DC

## 2018-06-15 NOTE — Progress Notes (Signed)
Patient's IV removed.  Site WNL.  AVS reviewed with patient and patient's daughter, Santiago Glad.  Verbalized understanding of discharge instructions, physician follow-up, medications.  Prescription given to patient.  Patient transported by RN via wheelchair to main entrance at discharge.  Patient and daughter reports belongings intact and in possession at time of discharge.  Patient stable at time of discharge.

## 2018-06-15 NOTE — Discharge Summary (Signed)
Physician Discharge Summary  Timothy Khan BHA:[id number] DOB: 1931/06/08 DOA: 06/06/2018  PCP: [name], MD  Admit date: 06/06/2018 Discharge date: 06/15/2018  Admitted From: Home Disposition:  Home  Recommendations for Outpatient Follow-up:  1. Follow up with PCP in 1-2 weeks 2. Please obtain BMP/CBC in one week 3. Please take the Lasix if you notice any swelling, weight gain of more than 5 pounds, shortness of breath when you lie down flat.  Please call your primary care doctor if you take the Lasix.  Home Health: Yes, home health PT, OT, aide Equipment/Devices: No  Discharge Condition: Stable CODE STATUS: Full Diet recommendation: Regular  Brief/Interim Summary:  #) Acute pancreatitis: Patient presented with acute pancreatitis.  Patient had no evidence of gallstones and did not have any history of alcoholism.  His triglycerides were normal.  It was unclear the etiology of this.  There was some concern this could be related to HCTZ but this was not typical as patient had been on this medicine for years.  This was initially diagnosed as idiopathic pancreatitis.  Patient symptoms resolved and his diet was slowly advanced.  He did have some abdominal distention with x-ray showing colonic gas but he continued to have good bowel function.  He was discharged on stool softeners.  #) Wheezing/fluid overload: Patient was noted to have wheezing, fluid overload.  A echo was not performed due to policy over the normal coronavirus and exposure to echo techs.  Patient was initially given IV furosemide twice a day.  Patient was noted to have pleural effusion on CT of the head and neck.  This was drained on 06/14/2018 with studies considered consistent with an inflammatory cause such as his pancreatitis.  Patient was given short acting PRN albuterol with improvement as well as furosemide.  Patient was discharged on furosemide.  #) Elevated white count/low-grade temperatures: Patient did have  low-grade temperatures and elevated white count.  Patient initially was on IV cefepime and metronidazole and completed several days of this however his cultures were negative and it did not appear to be suspicious for an infection.  His procalcitonin was actually relatively low at 0.55 and then 0.44 off antibiotics.  His pleural cultures are no growth to date.  #) Hypertension: Initially HCTZ and losartan were held but these were may be restarted on discharge.  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  He was evaluated by physical therapy and family insisted that they take the patient home as they were concerned about the recent coronavirus outbreak.  Patient was sent home with home health PT, OT, aide.  #) BPH: Patient was continued on tamsulosin.  Discharge Diagnoses:  Principal Problem:   Acute pancreatitis Active Problems:   Hereditary and idiopathic peripheral neuropathy   Hypertension   Foot drop, bilateral   BPH (benign prostatic hyperplasia)   LBBB (left bundle branch block)   GERD (gastroesophageal reflux disease)   Esophageal thickening   Hiatal hernia    Discharge Instructions  Discharge Instructions    Call MD for:  difficulty breathing, headache or visual disturbances   Complete by:  As directed    Call MD for:  hives   Complete by:  As directed    Call MD for:  persistant nausea and vomiting   Complete by:  As directed    Call MD for:  redness, tenderness, or signs of infection (pain, swelling, redness, odor or green/yellow discharge around incision site)  Complete by:  As directed    Call MD for:  severe uncontrolled pain   Complete by:  As directed    Call MD for:  temperature >100.4   Complete by:  As directed    Diet - low sodium heart healthy   Complete by:  As directed    Discharge instructions   Complete by:  As directed    Please follow-up with your primary care doctor in 1 to 2  weeks.  Please take the Lasix if you notice any swelling in your legs that is worse, weight gain of more than 5 pounds, shortness of breath while lying down flat.  Please call your doctor if you start taking the Lasix.   Increase activity slowly   Complete by:  As directed      Allergies as of 06/15/2018      Reactions   Codeine Nausea Only      Medication List    TAKE these medications   albuterol 108 (90 Base) MCG/ACT inhaler Commonly known as:  PROVENTIL HFA;VENTOLIN HFA Inhale 2 puffs into the lungs every 6 (six) hours as needed for wheezing or shortness of breath.   cetirizine 10 MG tablet Commonly known as:  ZYRTEC Take 10 mg by mouth daily.   Co Q 10 10 MG Caps Take 1 tablet by mouth every morning.   fluticasone 50 MCG/ACT nasal spray Commonly known as:  FLONASE Place 2 sprays into both nostrils at bedtime.   furosemide 40 MG tablet Commonly known as:  Lasix Take 1 tablet (40 mg total) by mouth daily as needed.   gabapentin 300 MG capsule Commonly known as:  NEURONTIN Take 2 capsules (600 mg total) by mouth at bedtime.   hydrochlorothiazide 12.5 MG capsule Commonly known as:  MICROZIDE Take 12.5 mg by mouth daily.   lactulose 10 GM/15ML solution Commonly known as:  CHRONULAC Take 15 mLs (10 g total) by mouth 3 (three) times daily.   losartan 50 MG tablet Commonly known as:  COZAAR Take 50 mg by mouth daily.   multivitamin capsule Take 1 capsule by mouth daily.   NEXIUM PO Take 22.3 mg by mouth daily.   polyethylene glycol packet Commonly known as:  MIRALAX / GLYCOLAX Take 17 g by mouth 2 (two) times daily.   psyllium 58.6 % packet Commonly known as:  METAMUCIL Take 1 packet by mouth daily.   tamsulosin 0.4 MG Caps capsule Commonly known as:  FLOMAX Take 0.4 mg by mouth at bedtime.   traMADol 50 MG tablet Commonly known as:  ULTRAM Take 2 tablets (100 mg total) by mouth daily.   traZODone 50 MG tablet Commonly known as:  DESYREL Take 1  tablet (50 mg total) by mouth at bedtime as needed for sleep.   triamcinolone cream 0.1 % Commonly known as:  KENALOG   Vitamin D3 50 MCG (2000 UT) capsule Take 2,000 Units by mouth every other day.      Follow-up Information    [facility], [facility] Follow up.   Specialty:  [facility]:  [facility] nursing/physical therapy/occupational therapy/aide/social worker Contact information: [place] [address] [phone]          Allergies  Allergen Reactions  . Codeine Nausea Only    Consultations:  Gastroenterology  Interventional radiology   Procedures/Studies: Dg Chest 1 View  Result Date: 06/13/2018 CLINICAL DATA:  S/p right-sided thoracentesis. EXAM: CHEST  1 VIEW COMPARISON:  06/12/2018 FINDINGS: Decreased opacity  is noted at the right lung base following right-sided thoracentesis. Residual opacity consistent with atelectasis and minimal pleural fluid. No pneumothorax. Persistent left lung base opacity is noted consistent with a left pleural effusion with associated atelectasis. Remainder of the lungs is clear. Cardiac silhouette is normal in size. No mediastinal or hilar masses. IMPRESSION: 1. Decrease in right lung base opacity following thoracentesis. No pneumothorax. 2. No other change from the previous day's study. Electronically Signed   By: Lajean Manes M.D.   On: 06/13/2018 15:11   Ct Soft Tissue Neck W Contrast  Result Date: 06/13/2018 CLINICAL DATA:  85 year old with wheezing and dyspnea. Evaluate for upper airway obstruction. EXAM: CT NECK WITH CONTRAST TECHNIQUE: Multidetector CT imaging of the neck was performed using the standard protocol following the bolus administration of intravenous contrast. CONTRAST:  30mL OMNIPAQUE IOHEXOL 300 MG/ML  SOLN COMPARISON:  None. FINDINGS: Pharynx and larynx: Normal. No mass or swelling. Salivary glands: No inflammation, mass, or stone. Thyroid: Normal. Lymph nodes: None enlarged or  abnormal density. Vascular: Negative.  Incidental dominant LEFT internal jugular. Limited intracranial: Negative. Visualized orbits: Negative. Mastoids and visualized paranasal sinuses: Clear. Skeleton: No acute or aggressive process. Upper chest: BILATERAL pleural effusions, RIGHT greater than LEFT. Granulomatous calcification of the hilar and mediastinal lymph nodes. Other: None. IMPRESSION: Unremarkable CT neck with contrast. No evidence of upper airway obstruction. BILATERAL pleural effusions, RIGHT greater than LEFT. Electronically Signed   By: Staci Righter M.D.   On: 06/13/2018 10:21   Ct Abdomen Pelvis W Contrast  Result Date: 06/08/2018 CLINICAL DATA:  Abdominal pain. Bowel obstruction. EXAM: CT ABDOMEN AND PELVIS WITH CONTRAST TECHNIQUE: Multidetector CT imaging of the abdomen and pelvis was performed using the standard protocol following bolus administration of intravenous contrast. CONTRAST:  139mL OMNIPAQUE IOHEXOL 300 MG/ML  SOLN COMPARISON:  06/06/2018 FINDINGS: Lower chest: Bilateral pleural effusions. Bilateral lower lobe subsegmental atelectasis. Hepatobiliary: Multiple liver cysts. Normal distention of the gallbladder. Pancreas: Increasing inflammatory changes of the head of the pancreas with diffuse edema, peripancreatic fat stranding and layering fluid. No evidence of pseudocyst formation. Spleen: Normal in size without focal abnormality. Adrenals/Urinary Tract: Adrenal glands are unremarkable. Kidneys are normal, without renal calculi, focal lesion, or hydronephrosis. Bladder is unremarkable. Stomach/Bowel: Small hiatal hernia. Dilation of the distal esophagus. No evidence of small-bowel obstruction. Scattered colonic diverticulosis. No evidence of acute diverticulitis. Vascular/Lymphatic: Aortic atherosclerosis. No enlarged abdominal or pelvic lymph nodes. Reactive peripancreatic lymph nodes. Reproductive: Enlargement of the prostate gland. Other: Small volume abdominopelvic ascites.  Musculoskeletal: Chronic compression fracture of L1 vertebral body with approximately 40% height loss. IMPRESSION: 1. Worsening inflammatory changes of the head of the pancreas with diffuse parenchymal edema, peripancreatic fat stranding and layering fluid. No evidence of pseudocyst formation. 2. Small volume abdominopelvic ascites. 3. Persistent thickening of the distal esophagus. 4. Scattered colonic diverticulosis without evidence of acute diverticulitis. 5. Enlarged prostate gland. Please correlate to serum PSA values. 6. Chronic L1 compression fracture. Electronically Signed   By: Fidela Salisbury M.D.   On: 06/08/2018 22:22   Ct Abdomen Pelvis W Contrast  Result Date: 06/06/2018 CLINICAL DATA:  85 year old male with a history abdominal pain nausea and vomiting EXAM: CT ABDOMEN AND PELVIS WITH CONTRAST TECHNIQUE: Multidetector CT imaging of the abdomen and pelvis was performed using the standard protocol following bolus administration of intravenous contrast. CONTRAST:  126mL OMNIPAQUE IOHEXOL 300 MG/ML  SOLN COMPARISON:  Chest x-ray 03/19/2018, MRI 04/14/2011 FINDINGS: Lower chest: Atelectatic changes at the lung bases. Circumferential thickening  of the distal esophagus above a hiatal hernia. Hepatobiliary: Low-density lesions of left and right liver with well-defined margins and no internal enhancement, all of which measure fluid density. Largest in the left measures 4.4 cm transverse diameter. No radiopaque stones within the gallbladder. There is no ductal dilatation of the extrahepatic or intrahepatic biliary ducts. No pericholecystic fluid. Pancreas: Inflammatory changes of the fat surrounding the pancreas, with trace fluid within the retroperitoneal fascial planes. The enhancement/attenuation of liver parenchyma is maintained, with no focal hypoperfusion/necrosis. Unremarkable appearance of the pancreatic duct. Spleen: Unremarkable spleen Adrenals/Urinary Tract: Unremarkable adrenal glands. No  evidence of hydronephrosis or nephrolithiasis. Unremarkable course the bilateral ureters. Partial distention of urinary bladder. Impression on the bladder base by the prostate. Stomach/Bowel: Unremarkable stomach. Unremarkable small bowel. No abnormal distention. No transition point. No focal wall thickening. Normal appendix. Mild stool burden. Diverticular change of the sigmoid colon. No acute inflammation. Vascular/Lymphatic: Minimal atherosclerotic calcifications of the abdominal aorta. Calcifications at the mesenteric artery origins, with calcified plaque at the celiac artery origin and soft plaque at the SMA origin. IMA is patent. Bilateral renal arteries patent. Reproductive: Transverse diameter of prostate measures 5.2 cm. Other: No hernia. Musculoskeletal: New compression fracture of L1 with 50% height loss anteriorly. The posterosuperior endplate demonstrates mild retropulsion without significant bony canal narrowing. Vacuum disc phenomenon. This compression fracture was not present on the MRI of 04/14/2011, and does not appear to have been present on the plain film chest x-ray of 03/19/2018. Degenerative changes of the hips. Degenerative changes of the lumbar spine. IMPRESSION: Acute pancreatitis, uncomplicated. The above results were discussed by telephone at the time of interpretation on 06/06/2018 at 10:52 am with Dr. Gerlene Fee. Circumferential thickening of the distal esophagus. Recommend correlation with a history of GERD, and referral for GI evaluation may be considered for potential upper endoscopy, as early malignancy can not be excluded. Compression fracture of L1 which is new from plain film dated 03/19/2018. Approximately 50% height loss. Diverticular change without evidence of acute diverticulitis. Low-density lesions of the liver which are most likely benign biliary cysts. Aortic Atherosclerosis (ICD10-I70.0). Electronically Signed   By: Corrie Mckusick D.O.   On: 06/06/2018 10:53   Dg Chest  Port 1 View  Result Date: 06/12/2018 CLINICAL DATA:  Wheezing EXAM: PORTABLE CHEST 1 VIEW COMPARISON:  June 09, 2018 FINDINGS: There is a shallow degree of inspiration. There are small pleural effusions bilaterally. There is atelectatic change in the left base. Lungs elsewhere are clear. Heart is upper normal in size with pulmonary vascularity normal. No adenopathy. No bone lesions. IMPRESSION: Small pleural effusions bilaterally with left base atelectatic change. No frank consolidation. Note shallow degree of inspiration. Stable cardiac silhouette. Electronically Signed   By: Lowella Grip III M.D.   On: 06/12/2018 09:08   Dg Chest Port 1 View  Result Date: 06/09/2018 CLINICAL DATA:  SOB, weakness, wheezing EXAM: PORTABLE CHEST - 1 VIEW COMPARISON:  06/07/2018 FINDINGS: Relatively low lung volumes as before. Worsening consolidation/atelectasis in the lung bases. Probable small pleural effusions right greater than left. Heart size upper limits normal for technique. No pneumothorax. Old healed left sixth and seventh rib fractures. IMPRESSION: Low volumes with worsening bibasilar atelectasis/consolidation and small effusions. Electronically Signed   By: Lucrezia Europe M.D.   On: 06/09/2018 10:29   Dg Chest Port 1 View  Result Date: 06/07/2018 CLINICAL DATA:  Sepsis. EXAM: PORTABLE CHEST 1 VIEW COMPARISON:  PA and lateral chest 03/19/2018 11/18/2009. FINDINGS: Lung volumes are much lower  than on the comparison examinations. Trace left pleural effusion and basilar atelectasis are unchanged since the most recent study. Right lung is clear. No pneumothorax. Remote left sixth and seventh rib fractures noted. No acute bony abnormality. IMPRESSION: Trace left pleural effusion and subsegmental atelectasis left lung base are unchanged since the most recent study. Electronically Signed   By: Inge Rise M.D.   On: 06/07/2018 17:37   Dg Abd 2 Views  Result Date: 06/12/2018 CLINICAL DATA:  Constipation and  abdominal pain. EXAM: ABDOMEN - 2 VIEW COMPARISON:  CT 4 days ago 06/08/2018 FINDINGS: Supine and decubitus views obtained. No evidence of free air. Moderate volume of stool throughout the hepatic flexure, descending, and rectosigmoid colon. Mild gaseous distension of ascending and mid transverse colon. No small bowel dilatation. No radiopaque calculi. IMPRESSION: Moderate colonic stool burden with mild gaseous distension of ascending colon, nonobstructive. Stool distends the rectum. No evidence of free air or small bowel dilatation. Electronically Signed   By: Keith Rake M.D.   On: 06/12/2018 19:37   [name] Ascites (abdomen Limited)  Result Date: 06/12/2018 CLINICAL DATA:  Evaluate ascites and possible paracentesis. EXAM: LIMITED ABDOMEN ULTRASOUND FOR ASCITES TECHNIQUE: Limited ultrasound survey for ascites was performed in all four abdominal quadrants. COMPARISON:  CT 06/08/2018 FINDINGS: Minimal ascites in the right upper quadrant. Trace amount of ascites in the right lower quadrant. No significant ascites in the left abdomen. IMPRESSION: Very small amount of ascites in the right abdomen. Not enough fluid for a paracentesis. Electronically Signed   By: Markus Daft M.D.   On: 06/12/2018 14:48   US Abdomen Limited Ruq  Result Date: 06/07/2018 CLINICAL DATA:  Pancreatitis. EXAM: ULTRASOUND ABDOMEN LIMITED RIGHT UPPER QUADRANT COMPARISON:  CT 06/06/2018 FINDINGS: Gallbladder: No evidence of gallstones or sludge. No significant gallbladder wall thickening. Negative sonographic Murphy sign. No adjacent free fluid. Common bile duct: Diameter: 2.6 mm. Liver: Mild increased parenchymal echogenicity compatible with steatosis with possible focal fatty sparing centrally. Tiny amount of perihepatic fluid. Several liver cysts as seen on recent CT. Portal vein is patent on color Doppler imaging with normal direction of blood flow towards the liver. IMPRESSION: No evidence of cholelithiasis. Mild hepatic steatosis with  multiple cysts unchanged from recent CT. Tiny amount of perihepatic fluid. Electronically Signed   By: Marin Olp M.D.   On: 06/07/2018 14:02   US Thoracentesis Asp Pleural Space W/img Guide  Result Date: 06/13/2018 INDICATION: Patient with history of abdominal pain, pancreatitis, dyspnea, right greater than left pleural effusions. Request made for diagnostic and therapeutic right thoracentesis. EXAM: ULTRASOUND GUIDED DIAGNOSTIC AND THERAPEUTIC RIGHT THORACENTESIS MEDICATIONS: None COMPLICATIONS: None immediate. PROCEDURE: An ultrasound guided thoracentesis was thoroughly discussed with the patient and questions answered. The benefits, risks, alternatives and complications were also discussed. The patient understands and wishes to proceed with the procedure. Written consent was obtained. Ultrasound was performed to localize and mark an adequate pocket of fluid in the right chest. The area was then prepped and draped in the normal sterile fashion. 1% Lidocaine was used for local anesthesia. Under ultrasound guidance a 6 Fr Safe-T-Centesis catheter was introduced. Thoracentesis was performed. The catheter was removed and a dressing applied. FINDINGS: A total of approximately 680 cc of yellow fluid was removed. Samples were sent to the laboratory as requested by the clinical team. IMPRESSION: Successful ultrasound guided diagnostic and therapeutic right thoracentesis yielding 680 cc of pleural fluid. Read by: Rowe Robert, PA-C Electronically Signed   By: Scherrie Gerlach.D.  On: 06/13/2018 15:05     06/11/2018 [id number]: One benign-appearing, intrinsic mild stenosis was found. The stenosis was traversed. A few diminutive semi-sessile polyps were found in the gastric body. The duodenal bulb, first portion of the duodenum, second portion of the duodenum and third portion of the duodenum were normal. The exam was otherwise without abnormality. -   Subjective:   Discharge Exam: Vitals:   06/14/18  2016 06/15/18 0502  BP: (!) 156/63 (!) 145/74  Pulse: 92 85  Resp: 18 18  Temp: 99.2 F (37.3 C) 98.6 F (37 C)  SpO2: 94% 92%   Vitals:   06/14/18 1158 06/14/18 1400 06/14/18 2016 06/15/18 0502  BP: (!) 141/67 (!) 149/74 (!) 156/63 (!) 145/74  Pulse: 83 88 92 85  Resp: 18 (!) 24 18 18   Temp: 98.1 F (36.7 C) 99.6 F (37.6 C) 99.2 F (37.3 C) 98.6 F (37 C)  TempSrc: Oral Oral Oral Oral  SpO2: 93% 94% 94% 92%  Weight:      Height:       General exam: Appears calm and comfortable  Respiratory system: No increased work of breathing, diminished lung sounds at bases, no rhonchi, no crackles Cardiovascular system: Distant heart sounds, regular rate and rhythm, no murmurs Gastrointestinal system: Soft, tympanitic, distended but soft, hypoactive bowel sounds, no rebound or guarding Central nervous system: Alert and oriented.  Grossly intact, moving all extremities Extremities: 1+ lower extremity edema, bilateral scrotal swelling but no tenderness Skin: No rashes over visible skin Psychiatry: Judgement and insight appear normal. Mood & affect appropriate.    The results of significant diagnostics from this hospitalization (including imaging, microbiology, ancillary and laboratory) are listed below for reference.     Microbiology: Recent Results (from the past 240 hour(s))  Culture, blood (x 2)     Status: None   Collection Time: 06/07/18  5:09 PM  Result Value Ref Range Status   Specimen Description   Final    BLOOD LEFT ANTECUBITAL Performed at [facility] [address] [place], [place] [address]    Special Requests   Final    BOTTLES DRAWN AEROBIC ONLY Blood Culture adequate volume Performed at [facility] [address] [place], [place] [address]    Culture   Final    NO GROWTH 5 DAYS Performed at [facility], [place] [address], [place], [place] [address]    Report Status 06/12/2018 FINAL  Final  Culture, blood (x 2)      Status: None   Collection Time: 06/07/18  5:14 PM  Result Value Ref Range Status   Specimen Description   Final    BLOOD LEFT FOREARM Performed at [facility] [address] [place], [place] [address]    Special Requests   Final    BOTTLES DRAWN AEROBIC AND ANAEROBIC Blood Culture adequate volume Performed at [facility] [address], [place], [place] [address]    Culture   Final    NO GROWTH 5 DAYS Performed at [facility], [place] [address] [place], [place] [address]    Report Status 06/12/2018 FINAL  Final  Body fluid culture     Status: None (Preliminary result)   Collection Time: 06/13/18  2:43 PM  Result Value Ref Range Status   Specimen Description FLUID ASCITIC  Final   Special Requests NONE  Final   Gram Stain   Final    MODERATE WBC PRESENT,BOTH PMN AND MONONUCLEAR  NO ORGANISMS SEEN    Culture   Final    NO GROWTH < 12 HOURS Performed at [facility] [address] [place], [place] [address]    Report Status PENDING  Incomplete     Labs: BNP (last 3 results) Recent Labs    06/12/18 0750  BNP 314.9*   Basic Metabolic Panel: Recent Labs  Lab 06/09/18 1523 06/11/18 0445 06/13/18 0424 06/14/18 0409 06/15/18 0353  NA 128* 132* 131* 135 135  K 3.2* 4.2 3.2* 4.1 3.5  CL 98 101 100 103 99  CO2 22 25 25 25 28   GLUCOSE 182* 124* 119* 119* 118*  BUN 15 16 17 20 20   CREATININE 0.89 0.90 0.96 1.02 1.02  CALCIUM 7.1* 7.3* 7.2* 7.6* 7.5*  MG  --   --  1.7  --   --    Liver Function Tests: Recent Labs  Lab 06/09/18 0526 06/11/18 0445 06/13/18 0424  AST 29 21 20   ALT 32 23 18  ALKPHOS 53 66 77  BILITOT 1.1 0.7 0.6  PROT 5.7* 5.7* 5.2*  ALBUMIN 2.8* 2.8* 2.3*   Recent Labs  Lab 06/11/18 0445  LIPASE 36   No results for input(s): AMMONIA in the last 168 hours. CBC: Recent Labs  Lab 06/09/18 0526 06/11/18 0445 06/13/18 0424 06/15/18 0353  WBC 20.1* 20.5* 16.7* 15.9*  NEUTROABS 18.1*  17.3*  --   --   HGB 12.5* 12.2* 12.1* 11.9*  HCT 38.0* 38.4* 38.2* 38.3*  MCV 88.6 90.6 89.7 91.4  PLT 178 194 223 296   Cardiac Enzymes: No results for input(s): CKTOTAL, CKMB, CKMBINDEX, TROPONINI in the last 168 hours. BNP: Invalid input(s): POCBNP CBG: No results for input(s): GLUCAP in the last 168 hours. D-Dimer No results for input(s): DDIMER in the last 72 hours. Hgb A1c No results for input(s): HGBA1C in the last 72 hours. Lipid Profile No results for input(s): CHOL, HDL, LDLCALC, TRIG, CHOLHDL, LDLDIRECT in the last 72 hours. Thyroid function studies No results for input(s): TSH, T4TOTAL, T3FREE, THYROIDAB in the last 72 hours.  Invalid input(s): FREET3 Anemia work up No results for input(s): VITAMINB12, FOLATE, FERRITIN, TIBC, IRON, RETICCTPCT in the last 72 hours. Urinalysis    Component Value Date/Time   COLORURINE YELLOW 06/06/2018 1015   APPEARANCEUR CLEAR 06/06/2018 1015   LABSPEC 1.015 06/06/2018 1015   PHURINE 7.5 06/06/2018 1015   GLUCOSEU NEGATIVE 06/06/2018 1015   HGBUR NEGATIVE 06/06/2018 1015   [place] 06/06/2018 1015   [place] 06/06/2018 1015   PROTEINUR 30 (A) 06/06/2018 1015   NITRITE NEGATIVE 06/06/2018 1015   LEUKOCYTESUR NEGATIVE 06/06/2018 1015   Sepsis Labs Invalid input(s): PROCALCITONIN,  WBC,  LACTICIDVEN Microbiology Recent Results (from the past 240 hour(s))  Culture, blood (x 2)     Status: None   Collection Time: 06/07/18  5:09 PM  Result Value Ref Range Status   Specimen Description   Final    BLOOD LEFT ANTECUBITAL Performed at [facility], [place] [address] [place], [place] [address]    Special Requests   Final    BOTTLES DRAWN AEROBIC ONLY Blood Culture adequate volume Performed at [place] [address] [place], [place] [address]    Culture   Final    NO GROWTH 5 DAYS Performed at [facility], [place] [address], [place], [place] [address]     Report Status 06/12/2018 FINAL  Final  Culture, blood (x 2)  Status: None   Collection Time: 06/07/18  5:14 PM  Result Value Ref Range Status   Specimen Description   Final    BLOOD LEFT FOREARM Performed at [place] [address] [place], [place] [address]    Special Requests   Final    BOTTLES DRAWN AEROBIC AND ANAEROBIC Blood Culture adequate volume Performed at [place] [address] [place], [place] [address]    Culture   Final    NO GROWTH 5 DAYS Performed at [facility], [place] [address] [place], [place] [address]    Report Status 06/12/2018 FINAL  Final  Body fluid culture     Status: None (Preliminary result)   Collection Time: 06/13/18  2:43 PM  Result Value Ref Range Status   Specimen Description FLUID ASCITIC  Final   Special Requests NONE  Final   Gram Stain   Final    MODERATE WBC PRESENT,BOTH PMN AND MONONUCLEAR NO ORGANISMS SEEN    Culture   Final    NO GROWTH < 12 HOURS Performed at [facility], [place] [address] [place], [place] [address]    Report Status PENDING  Incomplete     Time coordinating discharge: 59  SIGNED:   Cristy Folks, MD  [facility] 06/15/2018, 8:43 AM  If 7PM-7AM, please contact night-coverage [email or web address] Password [email or web address]

## 2018-06-15 NOTE — TOC Transition Note (Signed)
Transition of Care [facility]) - CM/SW Discharge Note   Patient Details  Name: Timothy Khan MRN: [id number] Date of Birth: Dec 19, 1931  [facility] CM/SW Contact:  Erenest Rasher, RN Phone Number: 06/15/2018, 11:06 AM   Clinical Narrative:    Kaylyn Layer of scheduled dc home today with [facility].    Final next level of care: [place] Barriers to Discharge: No Barriers Identified   Patient Goals and CMS Choice Patient states their goals for this hospitalization and ongoing recovery are:: (go home) CMS Medicare.gov Compare Post Acute Care list provided to:: Patient Represenative (must comment) Choice offered to / list presented to : Adult Children  Discharge Placement                       Discharge Plan and Services   Discharge Planning Services: CM Consult                HH Arranged: RN, PT, OT, Nurse's Aide, Social Work [employer] Agency: [facility]   Social Determinants of Health (SDOH) Interventions     Readmission Risk Interventions No flowsheet data found.

## 2018-06-15 NOTE — Discharge Instructions (Signed)
Acute Pancreatitis    The pancreas is a gland that is located behind the stomach on the left side of the abdomen. It produces enzymes that help to digest food. The pancreas also releases the hormones glucagon and insulin, which help to regulate blood sugar. Acute pancreatitis happens when inflammation of the pancreas suddenly occurs and the pancreas becomes irritated and swollen.  Most acute attacks last a few days and cause serious problems. Some people become dehydrated and develop low blood pressure. In severe cases, bleeding in the abdomen can lead to shock and can be life-threatening. The lungs, heart, and kidneys may fail.  What are the causes?  This condition may be caused by:  · Alcohol abuse.  · Drug abuse.  · Gallstones or other conditions that can block the tube that drains the pancreas (pancreatic duct).  · A tumor in the pancreas.  Other causes include:  · Certain medicines.  · Exposure to certain chemicals.  · Diabetes.  · An infection in the pancreas.  · Damage caused by an accident (trauma).  · The poison (venom) from a scorpion bite.  · Abdominal surgery.  · Autoimmune pancreatitis. This is when the body's disease-fighting (immune) system attacks the pancreas.  · Genes that are passed from parent to child (inherited).  In some cases, the cause of this condition is not known.  What are the signs or symptoms?  Symptoms of this condition include:  · Pain in the upper abdomen that may radiate to the back. Pain may be severe.  · Tenderness and swelling of the abdomen.  · Nausea and vomiting.  · Fever.  How is this diagnosed?  This condition may be diagnosed based on:  · A physical exam.  · Blood tests.  · Imaging tests, such as X-rays, CT or MRI scans, or an ultrasound of the abdomen.  How is this treated?  Treatment for this condition usually requires a stay in the hospital. Treatment for this condition may include:  · Pain medicine.  · Fluid replacement through an IV.  · Placing a tube in the stomach  to remove stomach contents and to control vomiting (NG tube, or nasogastric tube).  · Not eating for 3-4 days. This gives the pancreas a rest, because enzymes are not being produced that can cause further damage.  · Antibiotic medicines, if your condition is caused by an infection.  · Treating any underlying conditions that may be the cause.  · Steroid medicines, if your condition is caused by your immune system attacking your body's own tissues (autoimmune disease).  · Surgery on the pancreas or gallbladder.  Follow these instructions at home:  Eating and drinking    · Follow instructions from your health care provider about diet. This may involve avoiding alcohol and decreasing the amount of fat in your diet.  · Eat smaller, more frequent meals. This reduces the amount of digestive fluids that the pancreas produces.  · Drink enough fluid to keep your urine pale yellow.  · Do not drink alcohol if it caused your condition.  General instructions  · Take over-the-counter and prescription medicines only as told by your health care provider.  · Do not drive or use heavy machinery while taking prescription pain medicine.  · Ask your health care provider if the medicine prescribed to you can cause constipation. You may need to take steps to prevent or treat constipation, such as:  ? Take an over-the-counter or prescription medicine for constipation.  ? Eat   foods that are high in fiber such as whole grains and beans.  ? Limit foods that are high in fat and processed sugars, such as fried or sweet foods.  · Do not use any products that contain nicotine or tobacco, such as cigarettes, e-cigarettes, and chewing tobacco. If you need help quitting, ask your health care provider.  · Get plenty of rest.  · If directed, check your blood sugar at home as told by your health care provider.  · Keep all follow-up visits as told by your health care provider. This is important.  Contact a health care provider if you:  · Do not recover  as quickly as expected.  · Develop new or worsening symptoms.  · Have persistent pain, weakness, or nausea.  · Recover and then have another episode of pain.  · Have a fever.  Get help right away if:  · You cannot eat or keep fluids down.  · Your pain becomes severe.  · Your skin or the white part of your eyes turns yellow (jaundice).  · You have sudden swelling in your abdomen.  · You vomit.  · You feel dizzy or you faint.  · Your blood sugar is high (over 300 mg/dL).  Summary  · Acute pancreatitis happens when inflammation of the pancreas suddenly occurs and the pancreas becomes irritated and swollen.  · This condition is typically caused by alcohol abuse, drug abuse, or gallstones.  · Treatment for this condition usually requires a stay in the hospital.  This information is not intended to replace advice given to you by your health care provider. Make sure you discuss any questions you have with your health care provider.  Document Released: 03/06/2005 Document Revised: 09/10/2017 Document Reviewed: 09/10/2017  Elsevier Interactive Patient Education © 2019 Elsevier Inc.

## 2018-06-17 LAB — BODY FLUID CULTURE: Culture: NO GROWTH

## 2018-06-17 LAB — PATHOLOGIST SMEAR REVIEW

## 2018-07-19 ENCOUNTER — Other Ambulatory Visit: Payer: Self-pay | Admitting: Internal Medicine

## 2018-07-19 DIAGNOSIS — S32010D Wedge compression fracture of first lumbar vertebra, subsequent encounter for fracture with routine healing: Secondary | ICD-10-CM

## 2018-08-20 ENCOUNTER — Other Ambulatory Visit: Payer: Self-pay

## 2018-08-20 ENCOUNTER — Ambulatory Visit
Admission: RE | Admit: 2018-08-20 | Discharge: 2018-08-20 | Disposition: A | Discharge status: Home / Self Care | Payer: Medicare Other | Source: Ambulatory Visit | Attending: Internal Medicine | Admitting: Internal Medicine

## 2018-08-20 DIAGNOSIS — S32010D Wedge compression fracture of first lumbar vertebra, subsequent encounter for fracture with routine healing: Secondary | ICD-10-CM

## 2018-11-19 ENCOUNTER — Other Ambulatory Visit: Payer: Self-pay | Admitting: Internal Medicine

## 2018-11-19 ENCOUNTER — Ambulatory Visit
Admission: RE | Admit: 2018-11-19 | Discharge: 2018-11-19 | Disposition: A | Discharge status: Home / Self Care | Payer: Medicare Other | Source: Ambulatory Visit | Attending: Internal Medicine | Admitting: Internal Medicine

## 2018-11-19 DIAGNOSIS — M79671 Pain in right foot: Secondary | ICD-10-CM

## 2018-12-10 ENCOUNTER — Encounter: Payer: Self-pay | Admitting: Adult Health

## 2018-12-10 ENCOUNTER — Ambulatory Visit (INDEPENDENT_AMBULATORY_CARE_PROVIDER_SITE_OTHER): Payer: Medicare Other | Admitting: Adult Health

## 2018-12-10 ENCOUNTER — Other Ambulatory Visit: Payer: Self-pay

## 2018-12-10 VITALS — BP 125/73 | HR 75 | Temp 97.8°F | Ht 70.0 in | Wt 171.0 lb

## 2018-12-10 DIAGNOSIS — G629 Polyneuropathy, unspecified: Secondary | ICD-10-CM

## 2018-12-10 DIAGNOSIS — R4789 Other speech disturbances: Secondary | ICD-10-CM

## 2018-12-10 NOTE — Patient Instructions (Signed)
Your Plan:  Continue Gabapentin  Neuropathy has been stable can follow-up with PCP Follow-up with our office as needed or if symptoms worsen  Thank you for coming to see [name] at [facility]. I hope we have been able to provide you high quality care today.  You may receive a patient satisfaction survey over the next few weeks. We would appreciate your feedback and comments so that we may continue to improve ourselves and the health of our patients.

## 2018-12-10 NOTE — Progress Notes (Signed)
PATIENT: Timothy Khan DOB: Jan 17, 1932  REASON FOR VISIT: follow up HISTORY FROM: patient  HISTORY OF PRESENT ILLNESS: Today 12/10/18: Timothy Khan is a 85 year old male with a history of peripheral neuropathy.  He returns today for follow-up.  He remains on gabapentin 600 mg daily.  He reports that this is working well for him.  Occasionally he will have discomfort primarily in the big toe on both feet.  He states that he had a fall in December and January.  In January he suffered a compression fracture of L1.  Surgery was not indicated.  He reports that he is not had any falls since January.  He reports in March he was diagnosed with pancreatitis.  He uses a cane when he ambulates.  He does have a AFO braces for both legs however he states he has not used those since March.  He has participated in physical therapy through [facility] and [facility].  He states that this past Sunday he had an episode where he woke up and was slightly disoriented and for the rest of the day had trouble with word finding.  He states that this lasted for approximately 24 hours but has since resolved.  He denies any associated symptoms such as slurred speech, weakness or numbness in the extremities.  He returns today for evaluation.    HISTORY 12/04/17 (copied from Timothy Khan note) Timothy Khan is an 85 year old right-handed white male with a history of a peripheral neuropathy associate with a gait disorder.  The patient is having some discomfort in the feet at nighttime, he will have increased paresthesias and a throbbing sensation of his right great toe, this will last only several minutes and then dissipate.  He takes 600 mg of gabapentin at night, he recently increased from 300 mg.  The patient finds that this is helpful.  The patient was given a prescription for an AFO brace when last seen, but he never got this done.  The patient has had occasional falls, he uses a cane for ambulation.  He is  having increasing problems with numbness in the hands, he will drop things on occasion.  He returns to this office for an evaluation.  REVIEW OF SYSTEMS: Out of a complete 14 system review of symptoms, the patient complains only of the following symptoms, and all other reviewed systems are negative.  See HPI  ALLERGIES: Allergies  Allergen Reactions  . Codeine Nausea Only    HOME MEDICATIONS: Outpatient Medications Prior to Visit  Medication Sig Dispense Refill  . albuterol (PROVENTIL HFA;VENTOLIN HFA) 108 (90 Base) MCG/ACT inhaler Inhale 2 puffs into the lungs every 6 (six) hours as needed for wheezing or shortness of breath. 1 Inhaler 2  . cetirizine (ZYRTEC) 10 MG tablet Take 10 mg by mouth daily.    . Cholecalciferol (VITAMIN D3) 2000 UNITS capsule Take 2,000 Units by mouth every other day.     . Coenzyme Q10 (CO Q 10) 10 MG CAPS Take 1 tablet by mouth every morning.    . Esomeprazole Magnesium (NEXIUM PO) Take 22.3 mg by mouth daily.    . fluticasone (FLONASE) 50 MCG/ACT nasal spray Place 2 sprays into both nostrils at bedtime.     . furosemide (LASIX) 40 MG tablet Take 1 tablet (40 mg total) by mouth daily as needed. 30 tablet 1  . gabapentin (NEURONTIN) 300 MG capsule Take 2 capsules (600 mg total) by mouth at bedtime. 180 capsule 3  . hydrochlorothiazide (MICROZIDE) 12.5  MG capsule Take 12.5 mg by mouth daily.     Marland Kitchen lactulose (CHRONULAC) 10 GM/15ML solution Take 15 mLs (10 g total) by mouth 3 (three) times daily. 236 mL 0  . losartan (COZAAR) 50 MG tablet Take 50 mg by mouth daily.     . Multiple Vitamin (MULTIVITAMIN) capsule Take 1 capsule by mouth daily.    . polyethylene glycol (MIRALAX / GLYCOLAX) packet Take 17 g by mouth 2 (two) times daily. 14 each 0  . psyllium (METAMUCIL) 58.6 % packet Take 1 packet by mouth daily.    . tamsulosin (FLOMAX) 0.4 MG CAPS Take 0.4 mg by mouth at bedtime.     . traMADol (ULTRAM) 50 MG tablet Take 2 tablets (100 mg total) by mouth daily. 30  tablet 0  . traZODone (DESYREL) 50 MG tablet Take 1 tablet (50 mg total) by mouth at bedtime as needed for sleep. 30 tablet 0  . triamcinolone cream (KENALOG) 0.1 %      No facility-administered medications prior to visit.     PAST MEDICAL HISTORY: Past Medical History:  Diagnosis Date  . Abnormal brain MRI    Right side  . Basal cell carcinoma of back   . BPH (benign prostatic hyperplasia)   . Concussion 1950   Baseball injury  . Foot drop, bilateral 05/02/2013  . Gait disturbance   . Gastroesophageal reflux disease   . Hyperlipidemia   . Hypertension   . Lumbago   . Peripheral neuropathy   . Skull fracture ([place])     PAST SURGICAL HISTORY: Past Surgical History:  Procedure Laterality Date  . CATARACT EXTRACTION Bilateral   . ESOPHAGOGASTRODUODENOSCOPY (EGD) WITH PROPOFOL N/A 06/11/2018   Procedure: ESOPHAGOGASTRODUODENOSCOPY (EGD) WITH PROPOFOL;  Surgeon: Timothy Essex, MD;  Location: [facility];  Service: Endoscopy;  Laterality: N/A;  . KNEE SURGERY Left   . skin cancer resection     basal cell, [place] carcinoma  . TONSILLECTOMY    . VASECTOMY      FAMILY HISTORY: Family History  Problem Relation Age of Onset  . Cancer Mother        Breast cancer  . Heart attack Father   . Heart disease Brother     SOCIAL HISTORY: Social History   Socioeconomic History  . Marital status: Married    Spouse name: Not on file  . Number of children: 4  . Years of education: 22  . Highest education level: Not on file  Occupational History  . Occupation: Retired  Scientific laboratory technician  . Financial resource strain: Not on file  . Food insecurity    Worry: Not on file    Inability: Not on file  . Transportation needs    Medical: Not on file    Non-medical: Not on file  Tobacco Use  . Smoking status: Former Smoker    Quit date: 08/18/1984    Years since quitting: 34.3  . Smokeless tobacco: Never Used  Substance and Sexual Activity  . Alcohol use: No  . Drug use: No  . Sexual  activity: Not on file  Lifestyle  . Physical activity    Days per week: Not on file    Minutes per session: Not on file  . Stress: Not on file  Relationships  . Social Herbalist on phone: Not on file    Gets together: Not on file    Attends religious service: Not on file    Active member of club or organization: Not  on file    Attends meetings of clubs or organizations: Not on file    Relationship status: Not on file  . Intimate partner violence    Fear of current or ex partner: Not on file    Emotionally abused: Not on file    Physically abused: Not on file    Forced sexual activity: Not on file  Other Topics Concern  . Not on file  Social History Narrative   Patient states he is ambidextrous but is predominantly left handed.   Patient drinks 3 cups of coffee per day.      PHYSICAL EXAM  Vitals:   12/10/18 1400  BP: 125/73  Pulse: 75  Temp: 97.8 F (36.6 C)  Weight: 171 lb (77.6 kg)  Height: 5\' 10"  (1.778 m)   Body mass index is 24.54 kg/m.  Generalized: Well developed, in no acute distress   Neurological examination  Mentation: Alert oriented to time, place, history taking. Follows all commands speech and language fluent Cranial nerve II-XII: Pupils were equal round reactive to light. Extraocular movements were full, visual field were full on confrontational test.  Head turning and shoulder shrug  were normal and symmetric. Motor: The motor testing reveals 5 over 5 strength of all 4 extremities. Good symmetric motor tone is noted throughout.  Sensory: Sensory testing is intact to soft touch on all 4 extremities. No evidence of extinction is noted.  Coordination: Cerebellar testing reveals good finger-nose-finger and heel-to-shin bilaterally.  Gait and station: Patient's gait is wide-based.  Unsteady without his cane.  Tandem gait not attempted. Reflexes: Deep tendon reflexes are symmetric but depressed throughout  DIAGNOSTIC DATA (LABS, IMAGING,  TESTING) - I reviewed patient records, labs, notes, testing and imaging myself where available.  Lab Results  Component Value Date   WBC 15.9 (H) 06/15/2018   HGB 11.9 (L) 06/15/2018   HCT 38.3 (L) 06/15/2018   MCV 91.4 06/15/2018   PLT 296 06/15/2018      Component Value Date/Time   NA 135 06/15/2018 0353   K 3.5 06/15/2018 0353   CL 99 06/15/2018 0353   CO2 28 06/15/2018 0353   GLUCOSE 118 (H) 06/15/2018 0353   BUN 20 06/15/2018 0353   CREATININE 1.02 06/15/2018 0353   CALCIUM 7.5 (L) 06/15/2018 0353   PROT 5.2 (L) 06/13/2018 0424   ALBUMIN 2.3 (L) 06/13/2018 0424   AST 20 06/13/2018 0424   ALT 18 06/13/2018 0424   ALKPHOS 77 06/13/2018 0424   BILITOT 0.6 06/13/2018 0424   GFRNONAA >60 06/15/2018 0353   GFRAA >60 06/15/2018 0353   Lab Results  Component Value Date   TRIG 166 (H) 06/06/2018      ASSESSMENT AND PLAN 85 y.o. year old male  has a past medical history of Abnormal brain MRI, Basal cell carcinoma of back, BPH (benign prostatic hyperplasia), Concussion (1950), Foot drop, bilateral (05/02/2013), Gait disturbance, Gastroesophageal reflux disease, Hyperlipidemia, Hypertension, Lumbago, Peripheral neuropathy, and Skull fracture ([place]). here with:  1.  Peripheral neuropathy 2.  Word finding-transient onset  Overall the patient is doing well.  He will continue on gabapentin 600 mg at bedtime.  He has been stable with his gabapentin and neuropathy for the last several visits.  I advised that he can return to his primary care for continued follow-up.  He is encouraged to use his AFO braces and cane.  Advised the patient that we can do a complete work-up for the event that he had on Sunday however the patient  deferred.  States that he will let us know if it happens again.  He will follow-up with our office on an as-needed basis.      Ward Givens, MSN, NP-C 12/10/2018, 1:40 PM [facility] [address] [place], [place] [address] [phone]

## 2018-12-11 NOTE — Progress Notes (Signed)
I have read the note, and I agree with the clinical assessment and plan.  Christoph Copelan K Robt Okuda   

## 2019-06-09 ENCOUNTER — Ambulatory Visit: Payer: Medicare Other | Admitting: Adult Health

## 2019-11-20 ENCOUNTER — Other Ambulatory Visit: Payer: Self-pay | Admitting: Gastroenterology

## 2019-11-20 DIAGNOSIS — T17308A Unspecified foreign body in larynx causing other injury, initial encounter: Secondary | ICD-10-CM

## 2019-11-20 DIAGNOSIS — Z8719 Personal history of other diseases of the digestive system: Secondary | ICD-10-CM

## 2019-11-20 DIAGNOSIS — R197 Diarrhea, unspecified: Secondary | ICD-10-CM

## 2019-12-05 ENCOUNTER — Ambulatory Visit
Admission: RE | Admit: 2019-12-05 | Discharge: 2019-12-05 | Disposition: A | Discharge status: Home / Self Care | Payer: Medicare Other | Source: Ambulatory Visit | Attending: Gastroenterology | Admitting: Gastroenterology

## 2019-12-05 DIAGNOSIS — R197 Diarrhea, unspecified: Secondary | ICD-10-CM

## 2019-12-05 DIAGNOSIS — Z8719 Personal history of other diseases of the digestive system: Secondary | ICD-10-CM

## 2019-12-05 DIAGNOSIS — T17308A Unspecified foreign body in larynx causing other injury, initial encounter: Secondary | ICD-10-CM

## 2019-12-05 MED ORDER — IOPAMIDOL (ISOVUE-300) INJECTION 61%
100.0000 mL | Freq: Once | INTRAVENOUS | Status: AC | PRN
  Administered 2019-12-05: 100 mL via INTRAVENOUS

## 2019-12-09 ENCOUNTER — Other Ambulatory Visit: Payer: Self-pay | Admitting: Gastroenterology

## 2020-01-01 ENCOUNTER — Other Ambulatory Visit (HOSPITAL_COMMUNITY)
Admission: RE | Admit: 2020-01-01 | Discharge: 2020-01-01 | Disposition: A | Discharge status: Home / Self Care | Payer: Medicare Other | Source: Ambulatory Visit | Attending: Gastroenterology | Admitting: Gastroenterology

## 2020-01-01 DIAGNOSIS — Z01818 Encounter for other preprocedural examination: Secondary | ICD-10-CM | POA: Diagnosis present

## 2020-01-01 DIAGNOSIS — Z20822 Contact with and (suspected) exposure to covid-19: Secondary | ICD-10-CM | POA: Diagnosis not present

## 2020-01-01 LAB — SARS CORONAVIRUS 2 (TAT 6-24 HRS): SARS Coronavirus 2: NEGATIVE

## 2020-01-05 ENCOUNTER — Encounter (HOSPITAL_COMMUNITY): Payer: Self-pay | Admitting: Gastroenterology

## 2020-01-05 ENCOUNTER — Ambulatory Visit (HOSPITAL_COMMUNITY)
Admission: RE | Admit: 2020-01-05 | Discharge: 2020-01-05 | Disposition: A | Discharge status: Home / Self Care | Payer: Medicare Other | Attending: Gastroenterology | Admitting: Gastroenterology

## 2020-01-05 ENCOUNTER — Encounter (HOSPITAL_COMMUNITY)
Admission: RE | Disposition: A | Discharge status: Home / Self Care | Payer: Self-pay | Source: Home / Self Care | Attending: Gastroenterology

## 2020-01-05 DIAGNOSIS — R1310 Dysphagia, unspecified: Secondary | ICD-10-CM | POA: Insufficient documentation

## 2020-01-05 HISTORY — PX: ESOPHAGEAL MANOMETRY: SHX5429

## 2020-01-05 SURGERY — MANOMETRY, ESOPHAGUS

## 2020-01-05 MED ORDER — LIDOCAINE VISCOUS HCL 2 % MT SOLN
OROMUCOSAL | Status: AC
  Filled 2020-01-05: qty 15

## 2020-01-05 SURGICAL SUPPLY — 2 items
FACESHIELD LNG OPTICON STERILE (SAFETY) IMPLANT
GLOVE BIO SURGEON STRL SZ8 (GLOVE) ×4 IMPLANT

## 2020-01-05 NOTE — Progress Notes (Signed)
Esophageal manometry done per protocol.  Patient tolerated well.  Dr Wilford Corner notified of report to be read.

## 2020-01-07 ENCOUNTER — Encounter (HOSPITAL_COMMUNITY): Payer: Self-pay | Admitting: Gastroenterology

## 2020-01-16 ENCOUNTER — Other Ambulatory Visit: Payer: Self-pay | Admitting: Gastroenterology

## 2020-01-23 ENCOUNTER — Other Ambulatory Visit (HOSPITAL_BASED_OUTPATIENT_CLINIC_OR_DEPARTMENT_OTHER): Payer: Self-pay

## 2020-01-23 DIAGNOSIS — R0681 Apnea, not elsewhere classified: Secondary | ICD-10-CM

## 2020-01-23 DIAGNOSIS — R0683 Snoring: Secondary | ICD-10-CM

## 2020-01-23 DIAGNOSIS — G4710 Hypersomnia, unspecified: Secondary | ICD-10-CM

## 2020-02-24 ENCOUNTER — Other Ambulatory Visit: Payer: Self-pay

## 2020-02-24 ENCOUNTER — Encounter (HOSPITAL_COMMUNITY): Payer: Self-pay | Admitting: Gastroenterology

## 2020-02-26 ENCOUNTER — Other Ambulatory Visit (HOSPITAL_COMMUNITY)
Admission: RE | Admit: 2020-02-26 | Discharge: 2020-02-26 | Disposition: A | Discharge status: Home / Self Care | Payer: Medicare Other | Source: Ambulatory Visit | Attending: Gastroenterology | Admitting: Gastroenterology

## 2020-02-26 DIAGNOSIS — Z20822 Contact with and (suspected) exposure to covid-19: Secondary | ICD-10-CM | POA: Insufficient documentation

## 2020-02-26 DIAGNOSIS — Z01812 Encounter for preprocedural laboratory examination: Secondary | ICD-10-CM | POA: Insufficient documentation

## 2020-02-26 LAB — SARS CORONAVIRUS 2 (TAT 6-24 HRS): SARS Coronavirus 2: NEGATIVE

## 2020-02-27 ENCOUNTER — Ambulatory Visit (HOSPITAL_BASED_OUTPATIENT_CLINIC_OR_DEPARTMENT_OTHER): Payer: Medicare Other | Attending: Internal Medicine | Admitting: Internal Medicine

## 2020-02-27 ENCOUNTER — Other Ambulatory Visit: Payer: Self-pay

## 2020-02-27 DIAGNOSIS — R0683 Snoring: Secondary | ICD-10-CM

## 2020-02-27 DIAGNOSIS — G4736 Sleep related hypoventilation in conditions classified elsewhere: Secondary | ICD-10-CM | POA: Insufficient documentation

## 2020-02-27 DIAGNOSIS — G4710 Hypersomnia, unspecified: Secondary | ICD-10-CM

## 2020-02-27 DIAGNOSIS — G4733 Obstructive sleep apnea (adult) (pediatric): Secondary | ICD-10-CM | POA: Diagnosis not present

## 2020-02-27 DIAGNOSIS — R0681 Apnea, not elsewhere classified: Secondary | ICD-10-CM

## 2020-02-29 NOTE — Procedures (Signed)
   NAME: Timothy Khan DATE OF BIRTH:  05/25/1931 MEDICAL RECORD NUMBER [id number]  LOCATION: [facility]  PHYSICIAN: Marius Ditch  DATE OF STUDY: 02/27/2020  SLEEP STUDY TYPE: Nocturnal Polysomnogram               REFERRING PHYSICIAN: Marius Ditch, MD  INDICATION FOR STUDY: see below under clinical information  EPWORTH SLEEPINESS SCORE:  17 HEIGHT: 5\' 10"  (177.8 cm)  WEIGHT: 170 lb (77.1 kg)    Body mass index is 24.39 kg/m.  NECK SIZE: 16 in.  CLINICAL INFORMATION The patient was referred to the sleep center for evaluation of obstructive sleep apnea. Indications include excessive daytime sleepiness, snoring.   MEDICATIONS Patient self administered medications include: N/A. Medications administered during study include No sleep medicine administered.Marland Kitchen  SLEEP STUDY TECHNIQUE A multi-channel overnight Polysomnography study was performed. The channels recorded and monitored were central and occipital EEG, electrooculogram (EOG), submentalis EMG (chin), nasal and oral airflow, thoracic and abdominal wall motion, anterior tibialis EMG, snore microphone, electrocardiogram, and a pulse oximetry.  TECHNICAL COMMENTS Comments added by Technician: Patient had more than two awakenings to use the bathroom. PATIENT VISITED RESTROOM 4X Comments added by Scorer: N/A  SLEEP ARCHITECTURE The study was initiated at 9:58:50 PM and terminated at 4:40:17 AM. The total recorded time was 401.5 minutes. EEG confirmed total sleep time was 284.7 minutes yielding a sleep efficiency of 70.9%%. Sleep onset after lights out was 33.7 minutes with a REM latency of 317.5 minutes. The patient spent 2.1%% of the night in stage N1 sleep, 74.9%% in stage N2 sleep, 7.0%% in stage N3 and 16% in REM. Wake after sleep onset (WASO) was 83.0 minutes. The Arousal Index was 12.2/hour.  RESPIRATORY PARAMETERS There were a total of 82 respiratory disturbances out of which 35 were apneas (  33 obstructive, 0 mixed, 2 central) and 47 hypopneas. The apnea/hypopnea index (AHI) was 17.3 events/hour. The central sleep apnea index was 0.4 events/hour. The REM AHI was 52.7 events/hour and NREM AHI was 10.5 events/hour. The supine AHI was 17.3 events/hour. He slept only in the supine position. Respiratory disturbances were associated with oxygen desaturation down to a nadir of 81.0% during sleep. The mean oxygen saturation during the study was 91.8%. The cumulative time under 88% oxygen saturation was 8.6 minutes.  LEG MOVEMENT DATA The total leg movements were 0 with a resulting leg movement index of 0.0/hr . Associated arousal with leg movement index was 0.0/hr.  CARDIAC DATA The underlying cardiac rhythm was most consistent with sinus rhythm. Mean heart rate during sleep was 73.9 bpm. Additional rhythm abnormalities include None.  IMPRESSIONS - Moderate Obstructive Sleep apnea(OSA) - The patient did not have enough OSA early enough in the night to allow for PAP titration  DIAGNOSIS - Obstructive Sleep Apnea (G47.33) - Nocturnal Hypoxemia (G47.36)  RECOMMENDATIONS - CPAP is recommended.  Marius Ditch Sleep specialist. American Board of Internal Medicine  ELECTRONICALLY SIGNED ON:  02/29/2020, 2:56 PM [place] PH: [phone]   FX: [phone] [place]

## 2020-03-01 ENCOUNTER — Encounter (HOSPITAL_COMMUNITY)
Admission: RE | Disposition: A | Discharge status: Home / Self Care | Payer: Self-pay | Source: Home / Self Care | Attending: Gastroenterology

## 2020-03-01 ENCOUNTER — Ambulatory Visit (HOSPITAL_COMMUNITY): Payer: Medicare Other | Admitting: Anesthesiology

## 2020-03-01 ENCOUNTER — Other Ambulatory Visit: Payer: Self-pay

## 2020-03-01 ENCOUNTER — Encounter (HOSPITAL_COMMUNITY): Payer: Self-pay | Admitting: Gastroenterology

## 2020-03-01 ENCOUNTER — Ambulatory Visit (HOSPITAL_COMMUNITY)
Admission: RE | Admit: 2020-03-01 | Discharge: 2020-03-01 | Disposition: A | Discharge status: Home / Self Care | Payer: Medicare Other | Attending: Gastroenterology | Admitting: Gastroenterology

## 2020-03-01 DIAGNOSIS — Z79899 Other long term (current) drug therapy: Secondary | ICD-10-CM | POA: Diagnosis not present

## 2020-03-01 DIAGNOSIS — Z87891 Personal history of nicotine dependence: Secondary | ICD-10-CM | POA: Insufficient documentation

## 2020-03-01 DIAGNOSIS — R1310 Dysphagia, unspecified: Secondary | ICD-10-CM | POA: Insufficient documentation

## 2020-03-01 DIAGNOSIS — Q399 Congenital malformation of esophagus, unspecified: Secondary | ICD-10-CM | POA: Diagnosis not present

## 2020-03-01 DIAGNOSIS — R197 Diarrhea, unspecified: Secondary | ICD-10-CM | POA: Insufficient documentation

## 2020-03-01 DIAGNOSIS — Z885 Allergy status to narcotic agent status: Secondary | ICD-10-CM | POA: Diagnosis not present

## 2020-03-01 DIAGNOSIS — Z888 Allergy status to other drugs, medicaments and biological substances status: Secondary | ICD-10-CM | POA: Diagnosis not present

## 2020-03-01 DIAGNOSIS — Z85828 Personal history of other malignant neoplasm of skin: Secondary | ICD-10-CM | POA: Diagnosis not present

## 2020-03-01 DIAGNOSIS — K3189 Other diseases of stomach and duodenum: Secondary | ICD-10-CM | POA: Insufficient documentation

## 2020-03-01 HISTORY — DX: Sleep apnea, unspecified: G47.30

## 2020-03-01 HISTORY — PX: SUBMUCOSAL INJECTION: SHX5543

## 2020-03-01 HISTORY — PX: BIOPSY: SHX5522

## 2020-03-01 HISTORY — PX: ESOPHAGOGASTRODUODENOSCOPY (EGD) WITH PROPOFOL: SHX5813

## 2020-03-01 SURGERY — ESOPHAGOGASTRODUODENOSCOPY (EGD) WITH PROPOFOL
Anesthesia: Monitor Anesthesia Care

## 2020-03-01 MED ORDER — SODIUM CHLORIDE (PF) 0.9 % IJ SOLN
INTRAMUSCULAR | Status: DC | PRN
  Administered 2020-03-01: 4 mL via SUBMUCOSAL

## 2020-03-01 MED ORDER — PROPOFOL 500 MG/50ML IV EMUL
INTRAVENOUS | Status: DC | PRN
  Administered 2020-03-01: 150 ug/kg/min via INTRAVENOUS

## 2020-03-01 MED ORDER — SODIUM CHLORIDE 0.9 % IV SOLN: INTRAVENOUS | Status: DC

## 2020-03-01 MED ORDER — SODIUM CHLORIDE (PF) 0.9 % IJ SOLN
INTRAMUSCULAR | Status: AC
  Filled 2020-03-01: qty 10

## 2020-03-01 MED ORDER — PROPOFOL 500 MG/50ML IV EMUL
INTRAVENOUS | Status: AC
  Filled 2020-03-01: qty 50

## 2020-03-01 MED ORDER — ONABOTULINUMTOXINA 100 UNITS IJ SOLR
INTRAMUSCULAR | Status: AC
  Filled 2020-03-01: qty 100

## 2020-03-01 MED ORDER — PHENYLEPHRINE 40 MCG/ML (10ML) SYRINGE FOR IV PUSH (FOR BLOOD PRESSURE SUPPORT)
PREFILLED_SYRINGE | INTRAVENOUS | Status: DC | PRN
  Administered 2020-03-01: 80 ug via INTRAVENOUS

## 2020-03-01 MED ORDER — EPHEDRINE SULFATE-NACL 50-0.9 MG/10ML-% IV SOSY
PREFILLED_SYRINGE | INTRAVENOUS | Status: DC | PRN
  Administered 2020-03-01 (×2): 10 mg via INTRAVENOUS

## 2020-03-01 SURGICAL SUPPLY — 15 items

## 2020-03-01 NOTE — Anesthesia Postprocedure Evaluation (Signed)
Anesthesia Post Note  Patient: Timothy Khan  Procedure(s) Performed: ESOPHAGOGASTRODUODENOSCOPY (EGD) WITH PROPOFOL With Botox injections (N/A ) BIOPSY     Patient location during evaluation: Endoscopy Anesthesia Type: MAC Level of consciousness: awake and alert Pain management: pain level controlled Vital Signs Assessment: post-procedure vital signs reviewed and stable Respiratory status: spontaneous breathing, nonlabored ventilation and respiratory function stable Cardiovascular status: blood pressure returned to baseline and stable Postop Assessment: no apparent nausea or vomiting Anesthetic complications: no   No complications documented.  Last Vitals:  Vitals:   03/01/20 0920 03/01/20 0930  BP: (!) 104/44 (!) 114/58  Pulse: 70 66  Resp: 14 16  Temp:    SpO2: 97% 99%    Last Pain:  Vitals:   03/01/20 0910  TempSrc: Oral  PainSc: 0-No pain                 Lidia Collum

## 2020-03-01 NOTE — Anesthesia Preprocedure Evaluation (Addendum)
Anesthesia Evaluation  Patient identified by MRN, date of birth, ID band Patient awake    Reviewed: Allergy & Precautions, NPO status , Patient's Chart, lab work & pertinent test results  History of Anesthesia Complications Negative for: history of anesthetic complications  Airway Mallampati: II  TM Distance: >3 FB Neck ROM: Full    Dental  (+) Upper Dentures, Lower Dentures   Pulmonary sleep apnea , former smoker,    Pulmonary exam normal        Cardiovascular hypertension, Normal cardiovascular exam     Neuro/Psych H/o traumatic skull fx/TBI negative psych ROS   GI/Hepatic Neg liver ROS, hiatal hernia, GERD  ,dysphagia   Endo/Other  negative endocrine ROS  Renal/GU negative Renal ROS  negative genitourinary   Musculoskeletal negative musculoskeletal ROS (+)   Abdominal   Peds  Hematology negative hematology ROS (+)   Anesthesia Other Findings   Reproductive/Obstetrics                            Anesthesia Physical Anesthesia Plan  ASA: II  Anesthesia Plan: MAC   Post-op Pain Management:    Induction: Intravenous  PONV Risk Score and Plan: Propofol infusion, TIVA and Treatment may vary due to age or medical condition  Airway Management Planned: Natural Airway, Nasal Cannula and Simple Face Mask  Additional Equipment: None  Intra-op Plan:   Post-operative Plan:   Informed Consent: I have reviewed the patients History and Physical, chart, labs and discussed the procedure including the risks, benefits and alternatives for the proposed anesthesia with the patient or authorized representative who has indicated his/her understanding and acceptance.       Plan Discussed with:   Anesthesia Plan Comments:       Anesthesia Quick Evaluation

## 2020-03-01 NOTE — H&P (Signed)
HPI: 86 year old male with a dysphagia. Esophageal manometry showed elevated LES pressure.  Current Medications  Taking  .Florastor(Saccharomyces boulardii) 250 MG Capsule 1 capsule Orally Twice a day    .Tamsulosin HCl 0.4 MG Capsule TAKE ONE CAPSULE ONCE DAILY BY MOUTH 30 MINUTES AFTER THE SAME MEAL EACH DAY Orally Once a day    .Zofran(Ondansetron HCl) 4 MG Tablet 1 tablet Orally three times a day as needed    .amLODIPine Besylate 5 MG Tablet TAKE ONE TABLET BY MOUTH DAILY     .Gabapentin 300 MG Capsule 2 capsules Orally at bedtime    .traMADol HCl 50 MG Tablet 2 tablets Orally at bedtime    .Losartan Potassium 100 MG Tablet 1 tablet Orally Once a day    .NexIUM(Esomeprazole Magnesium) 20 MG Capsule Delayed Release 1 capsule Orally Once a day    .Colestipol HCl 1 GM Tablet 2 tablets Orally in the morning Once a day    .Fluticasone Propionate 50 Suspension INSTILL TWO SPRAYS INTO EACH NOSTRIL ONCE DAILY     .Coenzyme Q10 300 MG Capsule as directed Orally once a day    .Triamcinolone Acetonide 0.1 % Cream 1 application Externally Two times a Week, Notes: listed in epic    .Vitamin B12 1000 MCG Tablet Extended Release 1 tablet Orally Once a day    .Vitamin D 2000 Tablet 1 tablet Orally every other day    .ZyrTEC Allergy(Cetirizine HCl) 10 MG Tablet 1 tablet Orally Once a day    .Ibuprofen 200 MG Tablet 2 tablets Orally prn, Notes: Occassional use    .Fluocinonide 1.61 % Cream 1 application Externally Twice a day    .Mometasone Furoate 0.1 % Cream 1 application Externally Once a day   Not-Taking  .Benefiber - Powder as directed Orally every other day    .Psyllium 0.52 GM Capsule 6 capsules with 8 ounces of liquid as needed Orally twice a day    Medication List reviewed and reconciled with the patient         Past Medical History        Hypertension.         chronic kidney disease stage III.         BPH/borderline PSA.         GERD.           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         Chronic cough.         diastolic dysfunction, echocardiogram, dyspnea workup including cardiac catheterization, echo, Cardiolite, pulmonary evaluation negative except for diastolic dysfunction.         Seasonal allergic rhinitis, spring and fall.         Hyperlipidemia.         erectile dysfunction/Peyronie's disease/decreased libido, normal testosterone.         basal cell carcinoma - [facility].         Onychomycosis.         Mild memory loss.         Episodic low back pain.         SDH/skull fracture 2015.         Severe OSA ( HST 11/20 AHI 33/hr; O2 min-76%-32 mins)-APAP.         Neuro willis, ophtho shapiro derm [place].        Surgical History         left knee surgery, bone chip 1980s  tonsillectomy          left index finger nerve repair left cataract surgery          left cataract surgery          basal cell skin cancer, Tonia Brooms          Mohs surgery basal cell carcinoma, nose and forehead July 2007         right knee arthroscopy,Alusio November 2007         left cataract, Shapiro may 2007         MOHS l leg basal cell ca 7/10         oral infection surgery 7/12         Upper GI Endoscopy 05/2018       Family History   Father: deceased 72 yrs, MI, diagnosed with Coronary artery disease   Mother: deceased 65 yrs, diagnosed with Breast cancer   Brother 1: alive 49 yrs, MI in 75s, diagnosed with Coronary artery disease   Daughter(s): lupus   1 brother(s) . 1 son(s) , 3 daughter(s) - healthy.    No family hx of colon polyps ,colon cancer or liver disease.       Social History  General:   Tobacco use       cigarettes:  Former smoker     Quit in year  1986     Pack-year Hx:  15     Tobacco history last updated  02/16/2020     Additional Findings: Tobacco Non-User  Ex-moderate cigarette  smoker (10-19/day)     Vaping  No no EXPOSURE TO PASSIVE SMOKE.  no Alcohol, no.  Caffeine: yes.  no Recreational drug use.  Exercise: silver sneakers 4x a week, cardio and yoga, strength, zumba.  Marital Status: Married.  Children: Boys, 3, girls, 1; GC-9;GGC-6.  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      Allergies   Codeine (for allergy): feel bad - Side Effects   Doxazosin Mesylate: fatigue - Side Effects   Macrolides and Ketolides: rash - Allergy   Lisinopril: cough - Side Effects       Hospitalization/Major Diagnostic Procedure   Per pt saw Dr. Therisa Doyne in hospital 05/2018   Not within past yr. 02/2020       Review of Systems  GI PROCEDURE:          no Pacemaker/ AICD, no.  no Artificial heart valves.  no MI/heart attack.  no Abnormal heart rhythm.  no Angina.  no CVA.  Hypertension  YES.  no Hypotension.  no Asthma, COPD.  Sleep apnea  YES.  no Seizure disorders.  no Artificial joints.  no Severe DJD.  no Diabetes.  no Significant headaches.  no Vertigo.  no Depression/anxiety.  no Abnormal bleeding.  Kidney Disease  yes.  no Liver disease, no.  no Chance of pregnancy.  no Blood transfusion.  no Method of Birth Control.  no Birth control pills.          Vital Signs  Wt 176.6, Wt change .4 lb, Ht 69, BMI 26.08, Temp 97.8, Pulse sitting 84, BP sitting 137/69, Oxygen sat % 94, Respirations 16.     Examination  Gastroenterology::        GENERAL APPEARANCE: Well developed, well nourished, no active distress, pleasant.         SCLERA: anicteric.         CARDIOVASCULAR Normal RRR .         RESPIRATORY Breath sounds normal.  Respiration even and unlabored.         ABDOMEN No masses palpated. Liver and spleen not palpated, normal. Bowel sounds normal, Abdomen slightly distended,borborygmi noted.         EXTREMITIES: No edema.         NEURO: alert, oriented to time, place and person, normal gait.         PSYCH: mood/affect normal.       Assessment and  plan: Dysphagia Elevated LES pressure  Recommend endoscopy with Botox injection.

## 2020-03-01 NOTE — Transfer of Care (Signed)
Immediate Anesthesia Transfer of Care Note  Patient: Timothy Khan  Procedure(s) Performed: ESOPHAGOGASTRODUODENOSCOPY (EGD) WITH PROPOFOL With Botox injections (N/A )  Patient Location: PACU  Anesthesia Type:MAC  Level of Consciousness: awake  Airway & Oxygen Therapy: Patient Spontanous Breathing and Patient connected to face mask oxygen  Post-op Assessment: Report given to RN and Post -op Vital signs reviewed and stable  Post vital signs: Reviewed and stable  Last Vitals:  Vitals Value Taken Time  BP    Temp    Pulse    Resp    SpO2      Last Pain:  Vitals:   03/01/20 0822  TempSrc: Oral  PainSc: 0-No pain         Complications: No complications documented.

## 2020-03-01 NOTE — Op Note (Signed)
[facility] Patient Name: Timothy Khan Procedure Date: 03/01/2020 MRN: [id number] Attending MD: Ronnette Juniper , MD Date of Birth: [birth year] CSN: [id number] Age: 86 Admit Type: Outpatient Procedure:                Upper GI endoscopy Indications:              Dysphagia, abnormal esophageal manometry -elevated                            LES pressure, diarrhea Providers:                Ronnette Juniper, MD, Cleda Daub, RN, Elspeth Cho                            Tech., Technician, April Eulas Post, CRNA Referring MD:             Celene Kras Medicines:                Monitored Anesthesia Care Complications:            No immediate complications. Estimated blood loss:                            Minimal. Estimated Blood Loss:     Estimated blood loss was minimal. Procedure:                Pre-Anesthesia Assessment:                           - Prior to the procedure, a History and Physical                            was performed, and patient medications and                            allergies were reviewed. The patient's tolerance of                            previous anesthesia was also reviewed. The risks                            and benefits of the procedure and the sedation                            options and risks were discussed with the patient.                            All questions were answered, and informed consent                            was obtained. Prior Anticoagulants: The patient has                            taken no previous anticoagulant or antiplatelet                            agents.  ASA Grade Assessment: III - A patient with                            severe systemic disease. After reviewing the risks                            and benefits, the patient was deemed in                            satisfactory condition to undergo the procedure.                           After obtaining informed consent, the endoscope was                             passed under direct vision. Throughout the                            procedure, the patient's blood pressure, pulse, and                            oxygen saturations were monitored continuously. The                            GIF-H190 ([id number]) Olympus gastroscope was                            introduced through the mouth, and advanced to the                            second part of duodenum. The upper GI endoscopy was                            accomplished without difficulty. The patient                            tolerated the procedure well. Scope In: Scope Out: Findings:      The middle third of the esophagus and lower third of the esophagus were       moderately tortuous. Biopsies were obtained from the proximal and distal       esophagus with cold forceps for histology of suspected eosinophilic       esophagitis.      The Z-line was regular and was found 40 cm from the incisors. Area was       successfully injected with 100 units botulinum toxin, 1 cm above GE       junction, 25 units/36ml in a four quadrant fashion.      Localized mildly erythematous mucosa without bleeding was found in the       gastric antrum. Biopsies were taken with a cold forceps for Helicobacter       pylori testing.      The examined duodenum was normal. Biopsies for histology were taken with       a cold forceps for evaluation of celiac disease.      The cardia and gastric fundus  were normal on retroflexion. Impression:               - Tortuous esophagus. Biopsied.                           - Z-line regular, 40 cm from the incisors. Injected                            with botulinum toxin.                           - Erythematous mucosa in the antrum. Biopsied.                           - Normal examined duodenum. Biopsied. Moderate Sedation:      Patient did not receive moderate sedation for this procedure, but       instead received monitored anesthesia care. Recommendation:           -  Patient has a contact number available for                            emergencies. The signs and symptoms of potential                            delayed complications were discussed with the                            patient. Return to normal activities tomorrow.                            Written discharge instructions were provided to the                            patient.                           - Resume regular diet.                           - Continue present medications.                           - Await pathology results. Procedure Code(s):        --- Professional ---                           [id number], 59, Esophagogastroduodenoscopy, flexible,                            transoral; with directed submucosal injection(s),                            any substance                           36644, Esophagogastroduodenoscopy, flexible,  transoral; with biopsy, single or multiple Diagnosis Code(s):        --- Professional ---                           Q39.9, Congenital malformation of esophagus,                            unspecified                           K31.89, Other diseases of stomach and duodenum                           R13.10, Dysphagia, unspecified CPT copyright 2019 [employer]. All rights reserved. The codes documented in this report are preliminary and upon coder review may  be revised to meet current compliance requirements. Ronnette Juniper, MD 03/01/2020 9:07:42 AM This report has been signed electronically. Number of Addenda: 0

## 2020-03-01 NOTE — Discharge Instructions (Signed)

## 2020-03-01 NOTE — Brief Op Note (Signed)
03/01/2020  9:07 AM  PATIENT:  Timothy Khan  86 y.o. male  PRE-OPERATIVE DIAGNOSIS:  Diffuse spasm of esophagus/Abnormal Esophageal Manometry  POST-OPERATIVE DIAGNOSIS:  * No post-op diagnosis entered *  PROCEDURE:  Procedure(s): ESOPHAGOGASTRODUODENOSCOPY (EGD) WITH PROPOFOL With Botox injections (N/A)  SURGEON:  Surgeon(s) and Role:    Ronnette Juniper, MD - Primary  PHYSICIAN ASSISTANT:   ASSISTANTS: Claudie Revering   ANESTHESIA:   MAC  EBL:  Minimal  BLOOD ADMINISTERED:none  DRAINS: none   LOCAL MEDICATIONS USED:  none  SPECIMEN:  Biopsy / Limited Resection  DISPOSITION OF SPECIMEN:  PATHOLOGY  COUNTS:  YES  TOURNIQUET:  * No tourniquets in log *  DICTATION: .Dragon Dictation  PLAN OF CARE: Discharge to home after PACU  PATIENT DISPOSITION:  PACU - hemodynamically stable.   Delay start of Pharmacological VTE agent (>24hrs) due to surgical blood loss or risk of bleeding: not applicable

## 2020-03-02 ENCOUNTER — Other Ambulatory Visit: Payer: Self-pay

## 2020-03-02 ENCOUNTER — Encounter (HOSPITAL_COMMUNITY): Payer: Self-pay | Admitting: Gastroenterology

## 2020-03-02 LAB — SURGICAL PATHOLOGY

## 2020-04-12 DIAGNOSIS — L821 Other seborrheic keratosis: Secondary | ICD-10-CM | POA: Diagnosis not present

## 2020-04-12 DIAGNOSIS — C44719 Basal cell carcinoma of skin of left lower limb, including hip: Secondary | ICD-10-CM | POA: Diagnosis not present

## 2020-04-12 DIAGNOSIS — D1801 Hemangioma of skin and subcutaneous tissue: Secondary | ICD-10-CM | POA: Diagnosis not present

## 2020-04-12 DIAGNOSIS — Z85828 Personal history of other malignant neoplasm of skin: Secondary | ICD-10-CM | POA: Diagnosis not present

## 2020-04-12 DIAGNOSIS — L812 Freckles: Secondary | ICD-10-CM | POA: Diagnosis not present

## 2020-04-12 DIAGNOSIS — D485 Neoplasm of uncertain behavior of skin: Secondary | ICD-10-CM | POA: Diagnosis not present

## 2020-04-15 DIAGNOSIS — N1831 Chronic kidney disease, stage 3a: Secondary | ICD-10-CM | POA: Diagnosis not present

## 2020-04-15 DIAGNOSIS — I10 Essential (primary) hypertension: Secondary | ICD-10-CM | POA: Diagnosis not present

## 2020-04-15 DIAGNOSIS — E782 Mixed hyperlipidemia: Secondary | ICD-10-CM | POA: Diagnosis not present

## 2020-04-15 DIAGNOSIS — K219 Gastro-esophageal reflux disease without esophagitis: Secondary | ICD-10-CM | POA: Diagnosis not present

## 2020-04-15 DIAGNOSIS — I129 Hypertensive chronic kidney disease with stage 1 through stage 4 chronic kidney disease, or unspecified chronic kidney disease: Secondary | ICD-10-CM | POA: Diagnosis not present

## 2020-04-15 DIAGNOSIS — G8929 Other chronic pain: Secondary | ICD-10-CM | POA: Diagnosis not present

## 2020-04-29 DIAGNOSIS — Z20822 Contact with and (suspected) exposure to covid-19: Secondary | ICD-10-CM | POA: Diagnosis not present

## 2020-05-04 DIAGNOSIS — G8929 Other chronic pain: Secondary | ICD-10-CM | POA: Diagnosis not present

## 2020-05-04 DIAGNOSIS — K219 Gastro-esophageal reflux disease without esophagitis: Secondary | ICD-10-CM | POA: Diagnosis not present

## 2020-05-04 DIAGNOSIS — E782 Mixed hyperlipidemia: Secondary | ICD-10-CM | POA: Diagnosis not present

## 2020-05-04 DIAGNOSIS — I10 Essential (primary) hypertension: Secondary | ICD-10-CM | POA: Diagnosis not present

## 2020-05-04 DIAGNOSIS — I129 Hypertensive chronic kidney disease with stage 1 through stage 4 chronic kidney disease, or unspecified chronic kidney disease: Secondary | ICD-10-CM | POA: Diagnosis not present

## 2020-05-07 DIAGNOSIS — G4733 Obstructive sleep apnea (adult) (pediatric): Secondary | ICD-10-CM | POA: Diagnosis not present

## 2020-06-04 DIAGNOSIS — I129 Hypertensive chronic kidney disease with stage 1 through stage 4 chronic kidney disease, or unspecified chronic kidney disease: Secondary | ICD-10-CM | POA: Diagnosis not present

## 2020-06-04 DIAGNOSIS — E782 Mixed hyperlipidemia: Secondary | ICD-10-CM | POA: Diagnosis not present

## 2020-06-04 DIAGNOSIS — N1831 Chronic kidney disease, stage 3a: Secondary | ICD-10-CM | POA: Diagnosis not present

## 2020-06-04 DIAGNOSIS — G4733 Obstructive sleep apnea (adult) (pediatric): Secondary | ICD-10-CM | POA: Diagnosis not present

## 2020-06-04 DIAGNOSIS — I10 Essential (primary) hypertension: Secondary | ICD-10-CM | POA: Diagnosis not present

## 2020-06-04 DIAGNOSIS — K219 Gastro-esophageal reflux disease without esophagitis: Secondary | ICD-10-CM | POA: Diagnosis not present

## 2020-06-04 DIAGNOSIS — G8929 Other chronic pain: Secondary | ICD-10-CM | POA: Diagnosis not present

## 2020-06-15 DIAGNOSIS — G4733 Obstructive sleep apnea (adult) (pediatric): Secondary | ICD-10-CM | POA: Diagnosis not present

## 2020-07-02 DIAGNOSIS — G8929 Other chronic pain: Secondary | ICD-10-CM | POA: Diagnosis not present

## 2020-07-02 DIAGNOSIS — E782 Mixed hyperlipidemia: Secondary | ICD-10-CM | POA: Diagnosis not present

## 2020-07-02 DIAGNOSIS — K219 Gastro-esophageal reflux disease without esophagitis: Secondary | ICD-10-CM | POA: Diagnosis not present

## 2020-07-02 DIAGNOSIS — I10 Essential (primary) hypertension: Secondary | ICD-10-CM | POA: Diagnosis not present

## 2020-07-02 DIAGNOSIS — N1831 Chronic kidney disease, stage 3a: Secondary | ICD-10-CM | POA: Diagnosis not present

## 2020-07-02 DIAGNOSIS — I129 Hypertensive chronic kidney disease with stage 1 through stage 4 chronic kidney disease, or unspecified chronic kidney disease: Secondary | ICD-10-CM | POA: Diagnosis not present

## 2020-07-05 DIAGNOSIS — G4733 Obstructive sleep apnea (adult) (pediatric): Secondary | ICD-10-CM | POA: Diagnosis not present

## 2020-07-22 DIAGNOSIS — E782 Mixed hyperlipidemia: Secondary | ICD-10-CM | POA: Diagnosis not present

## 2020-07-22 DIAGNOSIS — N1831 Chronic kidney disease, stage 3a: Secondary | ICD-10-CM | POA: Diagnosis not present

## 2020-07-22 DIAGNOSIS — Z1389 Encounter for screening for other disorder: Secondary | ICD-10-CM | POA: Diagnosis not present

## 2020-07-22 DIAGNOSIS — K219 Gastro-esophageal reflux disease without esophagitis: Secondary | ICD-10-CM | POA: Diagnosis not present

## 2020-07-22 DIAGNOSIS — Z0000 Encounter for general adult medical examination without abnormal findings: Secondary | ICD-10-CM | POA: Diagnosis not present

## 2020-07-22 DIAGNOSIS — I129 Hypertensive chronic kidney disease with stage 1 through stage 4 chronic kidney disease, or unspecified chronic kidney disease: Secondary | ICD-10-CM | POA: Diagnosis not present

## 2020-07-22 DIAGNOSIS — G6289 Other specified polyneuropathies: Secondary | ICD-10-CM | POA: Diagnosis not present

## 2020-07-22 DIAGNOSIS — G4733 Obstructive sleep apnea (adult) (pediatric): Secondary | ICD-10-CM | POA: Diagnosis not present

## 2020-07-25 ENCOUNTER — Inpatient Hospital Stay (HOSPITAL_COMMUNITY)
Admission: EM | Admit: 2020-07-25 | Discharge: 2020-07-28 | DRG: 439 | Disposition: A | Discharge status: Home / Self Care | Payer: Medicare Other | Attending: Internal Medicine | Admitting: Internal Medicine

## 2020-07-25 ENCOUNTER — Emergency Department (HOSPITAL_COMMUNITY): Payer: Medicare Other

## 2020-07-25 DIAGNOSIS — G629 Polyneuropathy, unspecified: Secondary | ICD-10-CM | POA: Diagnosis present

## 2020-07-25 DIAGNOSIS — G609 Hereditary and idiopathic neuropathy, unspecified: Secondary | ICD-10-CM | POA: Diagnosis present

## 2020-07-25 DIAGNOSIS — Z87820 Personal history of traumatic brain injury: Secondary | ICD-10-CM

## 2020-07-25 DIAGNOSIS — M21371 Foot drop, right foot: Secondary | ICD-10-CM | POA: Diagnosis present

## 2020-07-25 DIAGNOSIS — I129 Hypertensive chronic kidney disease with stage 1 through stage 4 chronic kidney disease, or unspecified chronic kidney disease: Secondary | ICD-10-CM | POA: Diagnosis present

## 2020-07-25 DIAGNOSIS — K5730 Diverticulosis of large intestine without perforation or abscess without bleeding: Secondary | ICD-10-CM | POA: Diagnosis present

## 2020-07-25 DIAGNOSIS — M21372 Foot drop, left foot: Secondary | ICD-10-CM | POA: Diagnosis not present

## 2020-07-25 DIAGNOSIS — R0602 Shortness of breath: Secondary | ICD-10-CM

## 2020-07-25 DIAGNOSIS — I700 Atherosclerosis of aorta: Secondary | ICD-10-CM | POA: Diagnosis present

## 2020-07-25 DIAGNOSIS — I447 Left bundle-branch block, unspecified: Secondary | ICD-10-CM | POA: Diagnosis not present

## 2020-07-25 DIAGNOSIS — N179 Acute kidney failure, unspecified: Secondary | ICD-10-CM | POA: Diagnosis not present

## 2020-07-25 DIAGNOSIS — Z8249 Family history of ischemic heart disease and other diseases of the circulatory system: Secondary | ICD-10-CM | POA: Diagnosis not present

## 2020-07-25 DIAGNOSIS — N1831 Chronic kidney disease, stage 3a: Secondary | ICD-10-CM | POA: Diagnosis present

## 2020-07-25 DIAGNOSIS — M4856XA Collapsed vertebra, not elsewhere classified, lumbar region, initial encounter for fracture: Secondary | ICD-10-CM | POA: Diagnosis not present

## 2020-07-25 DIAGNOSIS — Z85828 Personal history of other malignant neoplasm of skin: Secondary | ICD-10-CM

## 2020-07-25 DIAGNOSIS — Z20822 Contact with and (suspected) exposure to covid-19: Secondary | ICD-10-CM | POA: Diagnosis present

## 2020-07-25 DIAGNOSIS — K859 Acute pancreatitis without necrosis or infection, unspecified: Secondary | ICD-10-CM | POA: Diagnosis not present

## 2020-07-25 DIAGNOSIS — K7689 Other specified diseases of liver: Secondary | ICD-10-CM | POA: Diagnosis not present

## 2020-07-25 DIAGNOSIS — K219 Gastro-esophageal reflux disease without esophagitis: Secondary | ICD-10-CM | POA: Diagnosis present

## 2020-07-25 DIAGNOSIS — K760 Fatty (change of) liver, not elsewhere classified: Secondary | ICD-10-CM | POA: Diagnosis not present

## 2020-07-25 DIAGNOSIS — Z79891 Long term (current) use of opiate analgesic: Secondary | ICD-10-CM | POA: Diagnosis not present

## 2020-07-25 DIAGNOSIS — Z87891 Personal history of nicotine dependence: Secondary | ICD-10-CM

## 2020-07-25 DIAGNOSIS — N400 Enlarged prostate without lower urinary tract symptoms: Secondary | ICD-10-CM | POA: Diagnosis present

## 2020-07-25 DIAGNOSIS — Z888 Allergy status to other drugs, medicaments and biological substances status: Secondary | ICD-10-CM

## 2020-07-25 DIAGNOSIS — Z885 Allergy status to narcotic agent status: Secondary | ICD-10-CM

## 2020-07-25 DIAGNOSIS — K8500 Idiopathic acute pancreatitis without necrosis or infection: Secondary | ICD-10-CM | POA: Diagnosis not present

## 2020-07-25 DIAGNOSIS — I10 Essential (primary) hypertension: Secondary | ICD-10-CM | POA: Diagnosis not present

## 2020-07-25 DIAGNOSIS — E785 Hyperlipidemia, unspecified: Secondary | ICD-10-CM | POA: Diagnosis present

## 2020-07-25 DIAGNOSIS — K5750 Diverticulosis of both small and large intestine without perforation or abscess without bleeding: Secondary | ICD-10-CM | POA: Diagnosis not present

## 2020-07-25 DIAGNOSIS — Z79899 Other long term (current) drug therapy: Secondary | ICD-10-CM

## 2020-07-25 DIAGNOSIS — N189 Chronic kidney disease, unspecified: Secondary | ICD-10-CM

## 2020-07-25 DIAGNOSIS — G4733 Obstructive sleep apnea (adult) (pediatric): Secondary | ICD-10-CM | POA: Diagnosis not present

## 2020-07-25 DIAGNOSIS — R1084 Generalized abdominal pain: Secondary | ICD-10-CM | POA: Diagnosis not present

## 2020-07-25 LAB — CBC WITH DIFFERENTIAL/PLATELET
Abs Immature Granulocytes: 0.09 10*3/uL — ABNORMAL HIGH (ref 0.00–0.07)
Basophils Absolute: 0.1 10*3/uL (ref 0.0–0.1)
Basophils Relative: 1 %
Eosinophils Absolute: 0.1 10*3/uL (ref 0.0–0.5)
Eosinophils Relative: 1 %
HCT: 47.7 % (ref 39.0–52.0)
Hemoglobin: 15.6 g/dL (ref 13.0–17.0)
Immature Granulocytes: 1 %
Lymphocytes Relative: 17 %
Lymphs Abs: 1.7 10*3/uL (ref 0.7–4.0)
MCH: 28.9 pg (ref 26.0–34.0)
MCHC: 32.7 g/dL (ref 30.0–36.0)
MCV: 88.3 fL (ref 80.0–100.0)
Monocytes Absolute: 0.7 10*3/uL (ref 0.1–1.0)
Monocytes Relative: 7 %
Neutro Abs: 7.6 10*3/uL (ref 1.7–7.7)
Neutrophils Relative %: 73 %
Platelets: 238 10*3/uL (ref 150–400)
RBC: 5.4 MIL/uL (ref 4.22–5.81)
RDW: 12.6 % (ref 11.5–15.5)
WBC: 10.2 10*3/uL (ref 4.0–10.5)
nRBC: 0 % (ref 0.0–0.2)

## 2020-07-25 LAB — HEPATIC FUNCTION PANEL
ALT: 53 U/L — ABNORMAL HIGH (ref 0–44)
AST: 76 U/L — ABNORMAL HIGH (ref 15–41)
Albumin: 5.1 g/dL — ABNORMAL HIGH (ref 3.5–5.0)
Alkaline Phosphatase: 81 U/L (ref 38–126)
Bilirubin, Direct: 0.6 mg/dL — ABNORMAL HIGH (ref 0.0–0.2)
Indirect Bilirubin: 1 mg/dL — ABNORMAL HIGH (ref 0.3–0.9)
Total Bilirubin: 1.6 mg/dL — ABNORMAL HIGH (ref 0.3–1.2)
Total Protein: 8.8 g/dL — ABNORMAL HIGH (ref 6.5–8.1)

## 2020-07-25 LAB — LACTATE DEHYDROGENASE: LDH: 385 U/L — ABNORMAL HIGH (ref 98–192)

## 2020-07-25 LAB — BASIC METABOLIC PANEL
Anion gap: 9 (ref 5–15)
BUN: 22 mg/dL (ref 8–23)
CO2: 23 mmol/L (ref 22–32)
Calcium: 9 mg/dL (ref 8.9–10.3)
Chloride: 104 mmol/L (ref 98–111)
Creatinine, Ser: 1.41 mg/dL — ABNORMAL HIGH (ref 0.61–1.24)
GFR, Estimated: 49 mL/min — ABNORMAL LOW (ref >60)
Glucose, Bld: 152 mg/dL — ABNORMAL HIGH (ref 70–99)
Potassium: 4.4 mmol/L (ref 3.5–5.1)
Sodium: 136 mmol/L (ref 135–145)

## 2020-07-25 LAB — RESP PANEL BY RT-PCR (FLU A&B, COVID) ARPGX2
Influenza A by PCR: NEGATIVE
Influenza B by PCR: NEGATIVE
SARS Coronavirus 2 by RT PCR: NEGATIVE

## 2020-07-25 LAB — LIPASE, BLOOD: Lipase: 5107 U/L — ABNORMAL HIGH (ref 11–51)

## 2020-07-25 LAB — TRIGLYCERIDES: Triglycerides: 453 mg/dL — ABNORMAL HIGH (ref <150)

## 2020-07-25 MED ORDER — ALBUTEROL SULFATE HFA 108 (90 BASE) MCG/ACT IN AERS
2.0000 | INHALATION_SPRAY | Freq: Four times a day (QID) | RESPIRATORY_TRACT | Status: DC | PRN
  Filled 2020-07-25: qty 6.7

## 2020-07-25 MED ORDER — SACCHAROMYCES BOULARDII 250 MG PO CAPS
250.0000 mg | ORAL_CAPSULE | Freq: Two times a day (BID) | ORAL | Status: DC
  Administered 2020-07-25 – 2020-07-28 (×6): 250 mg via ORAL
  Filled 2020-07-25 (×7): qty 1

## 2020-07-25 MED ORDER — AMLODIPINE BESYLATE 5 MG PO TABS
5.0000 mg | ORAL_TABLET | Freq: Every day | ORAL | Status: DC
  Administered 2020-07-26 – 2020-07-28 (×3): 5 mg via ORAL
  Filled 2020-07-25 (×3): qty 1

## 2020-07-25 MED ORDER — GABAPENTIN 300 MG PO CAPS
600.0000 mg | ORAL_CAPSULE | Freq: Every day | ORAL | Status: DC
  Administered 2020-07-25 – 2020-07-27 (×3): 600 mg via ORAL
  Filled 2020-07-25 (×3): qty 2

## 2020-07-25 MED ORDER — VITAMIN D3 25 MCG (1000 UNIT) PO TABS
2000.0000 [IU] | ORAL_TABLET | ORAL | Status: DC
  Administered 2020-07-27: 2000 [IU] via ORAL
  Filled 2020-07-25: qty 2

## 2020-07-25 MED ORDER — FLUTICASONE PROPIONATE 50 MCG/ACT NA SUSP
2.0000 | Freq: Every day | NASAL | Status: DC
  Administered 2020-07-26 – 2020-07-27 (×2): 2 via NASAL
  Filled 2020-07-25: qty 16

## 2020-07-25 MED ORDER — TAMSULOSIN HCL 0.4 MG PO CAPS
0.4000 mg | ORAL_CAPSULE | Freq: Every day | ORAL | Status: DC
  Administered 2020-07-25 – 2020-07-27 (×3): 0.4 mg via ORAL
  Filled 2020-07-25 (×3): qty 1

## 2020-07-25 MED ORDER — LORATADINE 10 MG PO TABS
10.0000 mg | ORAL_TABLET | Freq: Every day | ORAL | Status: DC
  Administered 2020-07-26 – 2020-07-28 (×3): 10 mg via ORAL
  Filled 2020-07-25 (×3): qty 1

## 2020-07-25 MED ORDER — ONDANSETRON HCL 4 MG/2ML IJ SOLN
4.0000 mg | Freq: Once | INTRAMUSCULAR | Status: AC
  Administered 2020-07-25: 4 mg via INTRAVENOUS
  Filled 2020-07-25: qty 2

## 2020-07-25 MED ORDER — LOSARTAN POTASSIUM 25 MG PO TABS: 100.0000 mg | ORAL_TABLET | Freq: Every day | ORAL | Status: DC

## 2020-07-25 MED ORDER — HYDRALAZINE HCL 20 MG/ML IJ SOLN: 5.0000 mg | Freq: Four times a day (QID) | INTRAMUSCULAR | Status: DC | PRN

## 2020-07-25 MED ORDER — SODIUM CHLORIDE 0.9 % IV BOLUS
1000.0000 mL | Freq: Once | INTRAVENOUS | Status: AC
  Administered 2020-07-25: 1000 mL via INTRAVENOUS

## 2020-07-25 MED ORDER — MORPHINE SULFATE (PF) 2 MG/ML IV SOLN
2.0000 mg | INTRAVENOUS | Status: DC | PRN
  Administered 2020-07-25: 2 mg via INTRAVENOUS
  Filled 2020-07-25: qty 1

## 2020-07-25 MED ORDER — VITAMIN B-12 1000 MCG PO TABS
1000.0000 ug | ORAL_TABLET | Freq: Every day | ORAL | Status: DC
  Administered 2020-07-26 – 2020-07-28 (×3): 1000 ug via ORAL
  Filled 2020-07-25 (×3): qty 1

## 2020-07-25 MED ORDER — CO Q 10 100 MG PO CAPS: 100.0000 mg | ORAL_CAPSULE | Freq: Every day | ORAL | Status: DC

## 2020-07-25 MED ORDER — SODIUM CHLORIDE 0.9 % IV SOLN
12.5000 mg | Freq: Four times a day (QID) | INTRAVENOUS | Status: DC | PRN
  Administered 2020-07-25: 12.5 mg via INTRAVENOUS
  Filled 2020-07-25: qty 12.5

## 2020-07-25 MED ORDER — HYDROMORPHONE HCL 1 MG/ML IJ SOLN
1.0000 mg | Freq: Once | INTRAMUSCULAR | Status: AC
  Administered 2020-07-25: 1 mg via INTRAVENOUS
  Filled 2020-07-25: qty 1

## 2020-07-25 MED ORDER — IOHEXOL 300 MG/ML  SOLN
100.0000 mL | Freq: Once | INTRAMUSCULAR | Status: AC | PRN
  Administered 2020-07-25: 100 mL via INTRAVENOUS

## 2020-07-25 MED ORDER — SODIUM CHLORIDE 0.9 % IV SOLN: INTRAVENOUS | Status: DC

## 2020-07-25 MED ORDER — MORPHINE SULFATE (PF) 4 MG/ML IV SOLN
4.0000 mg | Freq: Once | INTRAVENOUS | Status: AC
  Administered 2020-07-25: 4 mg via INTRAVENOUS
  Filled 2020-07-25: qty 1

## 2020-07-25 MED ORDER — PANTOPRAZOLE SODIUM 40 MG PO TBEC
40.0000 mg | DELAYED_RELEASE_TABLET | Freq: Every day | ORAL | Status: DC
  Administered 2020-07-26 – 2020-07-28 (×3): 40 mg via ORAL
  Filled 2020-07-25 (×2): qty 1

## 2020-07-25 MED ORDER — TRAMADOL HCL 50 MG PO TABS
100.0000 mg | ORAL_TABLET | Freq: Every day | ORAL | Status: DC
  Administered 2020-07-25 – 2020-07-27 (×3): 100 mg via ORAL
  Filled 2020-07-25 (×3): qty 2

## 2020-07-25 NOTE — ED Provider Notes (Signed)
[facility] DEPT Provider Note   CSN: [id number] Arrival date & time: 07/25/20  1856     History CC:  Vomiting, abdominal pain  Timothy Khan is a 87 y.o. male with a history of idiopathic pancreatitis, hypertension, hyperlipidemia, present emergency department with epigastric pain, nausea and vomiting.  He reports onset of symptoms abruptly earlier today.  This is reminiscent of the pancreatitis he had several years ago.  He reports some vomiting.  He feels nauseated.  He called and spoke to his GI doctor, Dr. Therisa Doyne, who advised her to come to the emergency department for lab work and a work-up.  Patient denies to me that he drinks alcohol.  Reports he does still have his gallbladder.  Medical record review shows the patient was admitted for acute pancreatitis in March 2020.  He had no evidence of gallstones at that time.  Triglycerides were also normal.  He was diagnosed with idiopathic pancreatitis.  He was in the hospital for 9 days and was able to be discharged following that.  HPI     Past Medical History:  Diagnosis Date  . Abnormal brain MRI    Right side  . Basal cell carcinoma of back   . BPH (benign prostatic hyperplasia)   . Concussion 1950   Baseball injury  . Foot drop, bilateral 05/02/2013  . Gait disturbance   . Gastroesophageal reflux disease   . Hyperlipidemia   . Hypertension   . Lumbago   . Peripheral neuropathy   . Skull fracture ([place])   . Sleep apnea     Patient Active Problem List   Diagnosis Date Noted  . Acute pancreatitis 06/06/2018  . BPH (benign prostatic hyperplasia) 06/06/2018  . LBBB (left bundle branch block) 06/06/2018  . GERD (gastroesophageal reflux disease) 06/06/2018  . Esophageal thickening 06/06/2018  . Hiatal hernia 06/06/2018  . Foot drop, bilateral 05/02/2013  . SDH (subdural hematoma) ([place]) 04/30/2013  . Polyneuropathy 04/30/2013  . Skull fracture ([place]) 04/30/2013  . Thoracic or  lumbosacral neuritis or radiculitis, unspecified 03/18/2012  . Hereditary and idiopathic peripheral neuropathy 03/18/2012  . Abnormality of gait 03/18/2012  . Concussion with no loss of consciousness 03/18/2012  . Other and unspecified hyperlipidemia 03/18/2012  . Hypertension 03/18/2012    Past Surgical History:  Procedure Laterality Date  . BIOPSY  03/01/2020   Procedure: BIOPSY;  Surgeon: Ronnette Juniper, MD;  Location: [facility];  Service: Gastroenterology;;  . CATARACT EXTRACTION Bilateral   . ESOPHAGEAL MANOMETRY N/A 01/05/2020   Procedure: ESOPHAGEAL MANOMETRY (EM);  Surgeon: Ronnette Juniper, MD;  Location: [facility];  Service: Gastroenterology;  Laterality: N/A;  . ESOPHAGOGASTRODUODENOSCOPY (EGD) WITH PROPOFOL N/A 06/11/2018   Procedure: ESOPHAGOGASTRODUODENOSCOPY (EGD) WITH PROPOFOL;  Surgeon: Clarene Essex, MD;  Location: [facility];  Service: Endoscopy;  Laterality: N/A;  . ESOPHAGOGASTRODUODENOSCOPY (EGD) WITH PROPOFOL N/A 03/01/2020   Procedure: ESOPHAGOGASTRODUODENOSCOPY (EGD) WITH PROPOFOL With Botox injections;  Surgeon: Ronnette Juniper, MD;  Location: [facility];  Service: Gastroenterology;  Laterality: N/A;  . KNEE SURGERY Left   . skin cancer resection     basal cell, Thor carcinoma  . SUBMUCOSAL INJECTION  03/01/2020   Procedure: SUBMUCOSAL INJECTION;  Surgeon: Ronnette Juniper, MD;  Location: [facility];  Service: Gastroenterology;;  . TONSILLECTOMY    . VASECTOMY         Family History  Problem Relation Age of Onset  . Cancer Mother        Breast cancer  . Heart attack Father   .  Heart disease Brother     Social History   Tobacco Use  . Smoking status: Former Smoker    Quit date: 08/18/1984    Years since quitting: 35.9  . Smokeless tobacco: Never Used  Vaping Use  . Vaping Use: Never used  Substance Use Topics  . Alcohol use: No  . Drug use: No    Home Medications Prior to Admission medications   Medication Sig Start Date End Date Taking? Authorizing  Provider  albuterol (PROVENTIL HFA;VENTOLIN HFA) 108 (90 Base) MCG/ACT inhaler Inhale 2 puffs into the lungs every 6 (six) hours as needed for wheezing or shortness of breath. 06/15/18  Yes Purohit, Shrey C, MD  amLODipine (NORVASC) 5 MG tablet Take 5 mg by mouth daily.  06/24/18  Yes [provider]  cetirizine (ZYRTEC) 10 MG tablet Take 10 mg by mouth at bedtime.    Yes [provider]  Cholecalciferol (VITAMIN D3) 2000 UNITS capsule Take 2,000 Units by mouth every other day.    Yes [provider]  Coenzyme Q10 (CO Q 10) 100 MG CAPS Take 100 mg by mouth daily.   Yes [provider]  esomeprazole (NEXIUM) 20 MG capsule Take 20 mg by mouth daily.    Yes [provider]  fluocinonide (LIDEX) 0.05 % external solution Apply 1 application topically daily. 20 drops or 1 ml on the scalp 02/13/20  Yes [provider]  fluticasone (FLONASE) 50 MCG/ACT nasal spray Place 2 sprays into both nostrils at bedtime.  04/16/13  Yes [provider]  gabapentin (NEURONTIN) 300 MG capsule Take 2 capsules (600 mg total) by mouth at bedtime. 12/04/17  Yes Kathrynn Ducking, MD  losartan (COZAAR) 100 MG tablet Take 100 mg by mouth at bedtime.  05/22/17  Yes [provider]  mometasone (ELOCON) 0.1 % cream Apply 1 application topically daily. 11/28/19  Yes [provider]  ondansetron (ZOFRAN) 4 MG tablet Take 4 mg by mouth 3 (three) times daily as needed for nausea or vomiting. 01/05/20  Yes [provider]  saccharomyces boulardii (FLORASTOR) 250 MG capsule Take 250 mg by mouth 2 (two) times daily.   Yes [provider]  tamsulosin (FLOMAX) 0.4 MG CAPS Take 0.4 mg by mouth at bedtime.    Yes [provider]  traMADol (ULTRAM) 50 MG tablet Take 2 tablets (100 mg total) by mouth daily. Patient taking differently: Take 100 mg by mouth at bedtime. 06/15/18  Yes Purohit, Konrad Dolores, MD  vitamin B-12 (CYANOCOBALAMIN) 1000 MCG  tablet Take 1,000 mcg by mouth daily.    Yes [provider]  polyethylene glycol (MIRALAX / GLYCOLAX) packet Take 17 g by mouth 2 (two) times daily. Patient not taking: No sig reported 06/15/18   Purohit, Konrad Dolores, MD    Allergies    Doxazosin mesylate, Lisinopril, Macrolides and ketolides, and Codeine  Review of Systems   Review of Systems  Constitutional: Negative for chills and fever.  HENT: Negative for ear pain and sore throat.   Eyes: Negative for pain and visual disturbance.  Respiratory: Negative for cough and shortness of breath.   Cardiovascular: Negative for chest pain and palpitations.  Gastrointestinal: Positive for abdominal pain and nausea. Negative for vomiting.  Genitourinary: Negative for dysuria and hematuria.  Musculoskeletal: Negative for arthralgias and back pain.  Skin: Negative for color change and rash.  Neurological: Negative for syncope and light-headedness.  All other systems reviewed and are negative.   Physical Exam Updated Vital Signs  BP (!) 165/98   Pulse (!) 102   Temp 98.1 F (36.7 C) (Oral)   Resp 16   Ht 5\' 10"  (1.778 m)   Wt 81.6 kg   SpO2 92%   BMI 25.83 kg/m   Physical Exam Constitutional:      General: He is not in acute distress. HENT:     Head: Normocephalic and atraumatic.  Eyes:     Conjunctiva/sclera: Conjunctivae normal.     Pupils: Pupils are equal, round, and reactive to light.  Cardiovascular:     Rate and Rhythm: Normal rate and regular rhythm.  Pulmonary:     Effort: Pulmonary effort is normal. No respiratory distress.  Abdominal:     General: There is no distension.     Tenderness: There is abdominal tenderness in the epigastric area.  Skin:    General: Skin is warm and dry.  Neurological:     General: No focal deficit present.     Mental Status: He is alert. Mental status is at baseline.  Psychiatric:        Mood and Affect: Mood normal.        Behavior: Behavior normal.     ED Results /  Procedures / Treatments   Labs (all labs ordered are listed, but only abnormal results are displayed) Labs Reviewed  BASIC METABOLIC PANEL - Abnormal; Notable for the following components:      Result Value   Glucose, Bld 152 (*)    Creatinine, Ser 1.41 (*)    GFR, Estimated 49 (*)    All other components within normal limits  CBC WITH DIFFERENTIAL/PLATELET - Abnormal; Notable for the following components:   Abs Immature Granulocytes 0.09 (*)    All other components within normal limits  HEPATIC FUNCTION PANEL - Abnormal; Notable for the following components:   Total Protein 8.8 (*)    Albumin 5.1 (*)    AST 76 (*)    ALT 53 (*)    Total Bilirubin 1.6 (*)    Bilirubin, Direct 0.6 (*)    Indirect Bilirubin 1.0 (*)    All other components within normal limits  LIPASE, BLOOD - Abnormal; Notable for the following components:   Lipase 5,107 (*)    All other components within normal limits  TRIGLYCERIDES - Abnormal; Notable for the following components:   Triglycerides 453 (*)    All other components within normal limits  LACTATE DEHYDROGENASE - Abnormal; Notable for the following components:   LDH 385 (*)    All other components within normal limits  RESP PANEL BY RT-PCR (FLU A&B, COVID) [id number]  COMPREHENSIVE METABOLIC PANEL  CBC  TSH    EKG None  Radiology CT ABDOMEN PELVIS W CONTRAST  Result Date: 07/25/2020 CLINICAL DATA:  87 year old male with epigastric pain. EXAM: CT ABDOMEN AND PELVIS WITH CONTRAST TECHNIQUE: Multidetector CT imaging of the abdomen and pelvis was performed using the standard protocol following bolus administration of intravenous contrast. CONTRAST:  133mL OMNIPAQUE IOHEXOL 300 MG/ML  SOLN COMPARISON:  CT abdomen pelvis dated 12/05/2019. FINDINGS: Lower chest: Bibasilar atelectasis. The visualized lung bases are otherwise clear. No intra-abdominal free air or free fluid. Hepatobiliary: Diffuse fatty liver. Multiple hepatic cysts and additional subcentimeter  hypodense lesions which are too small to characterize. No intrahepatic biliary dilatation. The gallbladder is unremarkable. Pancreas: There is inflammatory changes of the pancreas predominantly involving the head and uncinate process of the pancreas consistent with acute pancreatitis. No drainable fluid collection or abscess/pseudocyst. Spleen: Normal in  size without focal abnormality. Adrenals/Urinary Tract: The adrenal glands are unremarkable. There is no hydronephrosis on either side. There is symmetric enhancement and excretion of contrast by both kidneys. Subcentimeter left renal hypodense lesions are too small to characterize. The visualized ureters and urinary bladder appear unremarkable. Stomach/Bowel: There is sigmoid diverticulosis without active inflammatory changes. There is no bowel obstruction or active inflammation. The appendix is normal. Vascular/Lymphatic: Mild aortoiliac atherosclerotic disease. The IVC is unremarkable. No portal venous gas. The SMV, splenic vein, and main portal vein are patent. There is no adenopathy. Reproductive: Enlarged prostate gland with median lobe hypertrophy. Other: None Musculoskeletal: Osteopenia with degenerative changes of the spine. No acute osseous pathology. Old L1 compression fracture with anterior wedging. IMPRESSION: 1. Acute pancreatitis. No abscess or abscess/pseudocyst. 2. Fatty liver. 3. Sigmoid diverticulosis. 4. Aortic Atherosclerosis (ICD10-I70.0). Electronically Signed   By: Anner Crete M.D.   On: 07/25/2020 20:57    Procedures Procedures   Medications Ordered in ED Medications  promethazine (PHENERGAN) 12.5 mg in sodium chloride 0.9 % 50 mL IVPB (12.5 mg Intravenous New Bag/Given 07/25/20 2222)  0.9 %  sodium chloride infusion (has no administration in time range)  morphine 2 MG/ML injection 2 mg (has no administration in time range)  hydrALAZINE (APRESOLINE) injection 5 mg (has no administration in time range)  traMADol (ULTRAM) tablet  100 mg (has no administration in time range)  amLODipine (NORVASC) tablet 5 mg (has no administration in time range)  pantoprazole (PROTONIX) EC tablet 40 mg (has no administration in time range)  saccharomyces boulardii (FLORASTOR) capsule 250 mg (has no administration in time range)  tamsulosin (FLOMAX) capsule 0.4 mg (has no administration in time range)  vitamin B-12 (CYANOCOBALAMIN) tablet 1,000 mcg (has no administration in time range)  gabapentin (NEURONTIN) capsule 600 mg (has no administration in time range)  cholecalciferol (VITAMIN D) tablet 2,000 Units (has no administration in time range)  fluticasone (FLONASE) 50 MCG/ACT nasal spray 2 spray (has no administration in time range)  loratadine (CLARITIN) tablet 10 mg (has no administration in time range)  albuterol (VENTOLIN HFA) 108 (90 Base) MCG/ACT inhaler 2 puff (has no administration in time range)  morphine 4 MG/ML injection 4 mg (4 mg Intravenous Given 07/25/20 1957)  ondansetron (ZOFRAN) injection 4 mg (4 mg Intravenous Given 07/25/20 1958)  sodium chloride 0.9 % bolus 1,000 mL (1,000 mLs Intravenous New Bag/Given 07/25/20 1942)  iohexol (OMNIPAQUE) 300 MG/ML solution 100 mL (100 mLs Intravenous Contrast Given 07/25/20 2039)  HYDROmorphone (DILAUDID) injection 1 mg (1 mg Intravenous Given 07/25/20 2207)    ED Course  I have reviewed the triage vital signs and the nursing notes.  Pertinent labs & imaging results that were available during my care of the patient were reviewed by me and considered in my medical decision making (see chart for details).  This patient complains of abdominal pain, nausea, vomiting.  This involves an extensive number of treatment options, and is a complaint that carries with it a high risk of complications and morbidity.  The differential diagnosis includes pancreatitis vs biliary disease vs gastritis vs colitis vs constipation vs UTI vs other  I ordered, reviewed, and interpreted labs, showing lipase 6107.   LDH 385.  Minor LFT elevations.  BMP near baseline I ordered medication IV zofran, IV morphine, IV fluids for nausea, vomiting, pain I ordered imaging studies which included CT abdomen pelvis with contrast I independently visualized and interpreted imaging which showed acute pancreatitis with no evidence abscess, no acute biliary  disease, and the monitor tracing which showed NSR Previous records obtained and reviewed showing 2020 hospitalization discharge for pancreatitis  My reassessment, the patient continued complaint abdominal pain after his morphine.  Additional IV Dilaudid as well as Phenergan was ordered for pain and nausea.   Clinical Course as of 07/25/20 2319  Nancy Fetter Jul 25, 2020  2103 IMPRESSION: 1. Acute pancreatitis. No abscess or abscess/pseudocyst. 2. Fatty liver. 3. Sigmoid diverticulosis. 4. Aortic Atherosclerosis (ICD10-I70.0).  [MT]  2158 Lipase(!): 5,107 [MT]  2210 Admitted hospitalist [MT]    Clinical Course User Index [MT] Wyvonnia Dusky, MD   Final Clinical Impression(s) / ED Diagnoses Final diagnoses:  Idiopathic acute pancreatitis, unspecified complication status    Rx / DC Orders ED Discharge Orders    None       Wyvonnia Dusky, MD 07/25/20 2320

## 2020-07-25 NOTE — ED Triage Notes (Signed)
Pt sent from Dr. Roddie Mc after speaking with her on the phone about his symptoms. Pt has been vomiting and having epigastric abdominal pain since 1500. Pt has hx of pancreatitis.

## 2020-07-25 NOTE — Progress Notes (Signed)
PHARMACIST - PHYSICIAN ORDER COMMUNICATION  CONCERNING: P&T Medication Policy on Herbal Medications  DESCRIPTION:  This patient's order for:  Co Q 10  has been noted.  This product(s) is classified as an "herbal" or natural product. Due to a lack of definitive safety studies or FDA approval, nonstandard manufacturing practices, plus the potential risk of unknown drug-drug interactions while on inpatient medications, the Pharmacy and Therapeutics Committee does not permit the use of "herbal" or natural products of this type within [facility].   ACTION TAKEN: The pharmacy department is unable to verify this order at this time and your patient has been informed of this safety policy. Please reevaluate patient's clinical condition at discharge and address if the herbal or natural product(s) should be resumed at that time.   Dolly Rias RPh 07/25/2020, 11:19 PM

## 2020-07-25 NOTE — H&P (Signed)
History and Physical    Timothy Khan JME:[id number] DOB: 02/15/1932 DOA: 07/25/2020  PCP: [name], MD  Patient coming from: Home, daughter his caregiver is at bedside  I have personally briefly reviewed patient's old medical records in [place]  Chief Complaint: abdominal pain, nausea and vomiting  HPI: Timothy Khan is a 87 y.o. male with medical history significant for idiopathic pancreatitis, hereditary peripheral neuropathy with foot drop, subdural hematoma, hypertension, OSA on CPAP, CKD stage IIIa, GERD and hyperlipidemia who presents with concerns of acute abdominal pain, nausea and vomiting.  Earlier this evening, he developed acute onset epigastric and later diffuse abdominal pain with persistent nausea and vomiting up to 8 episodes.  Denies any diarrhea.  No fever.  He was otherwise in his normal state of health.  He does not use alcohol.  He was diagnosed with acute pancreatitis in 2020 which was found to be idiopathic.  Initially thought could be due to HCTZ which daughter reports he is no longer on and has been switched to amlodipine.  He was afebrile, hypertensive up to systolic of 229N.  CBC with no leukocytosis or anemia.  Creatinine of 1.42 with no recent baseline to compare.  BG of 152.  Lipase of greater than 5000.  AST of 76, ALT of 53.  Total bili of 1.6.  Triglyceride of 453.  CT abdomen shows inflammatory changes of the head and uncinate process of the pancreas.  Gallbladder was unremarkable.  No intrahepatic biliary dilatation seen.   Review of Systems: Constitutional: No Weight Change, No Fever ENT/Mouth: No sore throat, No Rhinorrhea Eyes: No Eye Pain, No Vision Changes Cardiovascular: No Chest Pain, no SOB Respiratory: No Cough, No Sputum Gastrointestinal: + Nausea,+ Vomiting, No Diarrhea, No Constipation,+ Pain Genitourinary: no Urinary Incontinence, No Urgency, No Flank Pain Musculoskeletal: No Arthralgias, No Myalgias Skin: No  Skin Lesions, No Pruritus, Neuro: no Weakness, No Numbness Psych: No Anxiety/Panic, No Depression, no decrease appetite Heme/Lymph: No Bruising, No Bleeding  Past Medical History:  Diagnosis Date  . Abnormal brain MRI    Right side  . Basal cell carcinoma of back   . BPH (benign prostatic hyperplasia)   . Concussion 1950   Baseball injury  . Foot drop, bilateral 05/02/2013  . Gait disturbance   . Gastroesophageal reflux disease   . Hyperlipidemia   . Hypertension   . Lumbago   . Peripheral neuropathy   . Skull fracture ([place])   . Sleep apnea     Past Surgical History:  Procedure Laterality Date  . BIOPSY  03/01/2020   Procedure: BIOPSY;  Surgeon: Ronnette Juniper, MD;  Location: [facility];  Service: Gastroenterology;;  . CATARACT EXTRACTION Bilateral   . ESOPHAGEAL MANOMETRY N/A 01/05/2020   Procedure: ESOPHAGEAL MANOMETRY (EM);  Surgeon: Ronnette Juniper, MD;  Location: [facility];  Service: Gastroenterology;  Laterality: N/A;  . ESOPHAGOGASTRODUODENOSCOPY (EGD) WITH PROPOFOL N/A 06/11/2018   Procedure: ESOPHAGOGASTRODUODENOSCOPY (EGD) WITH PROPOFOL;  Surgeon: Clarene Essex, MD;  Location: [facility];  Service: Endoscopy;  Laterality: N/A;  . ESOPHAGOGASTRODUODENOSCOPY (EGD) WITH PROPOFOL N/A 03/01/2020   Procedure: ESOPHAGOGASTRODUODENOSCOPY (EGD) WITH PROPOFOL With Botox injections;  Surgeon: Ronnette Juniper, MD;  Location: [facility];  Service: Gastroenterology;  Laterality: N/A;  . KNEE SURGERY Left   . skin cancer resection     basal cell, [place] carcinoma  . SUBMUCOSAL INJECTION  03/01/2020   Procedure: SUBMUCOSAL INJECTION;  Surgeon: Ronnette Juniper, MD;  Location: [facility];  Service: Gastroenterology;;  . TONSILLECTOMY    .  VASECTOMY       reports that he quit smoking about 35 years ago. He has never used smokeless tobacco. He reports that he does not drink alcohol and does not use drugs. Social History  Allergies  Allergen Reactions  . Doxazosin Mesylate     Other  reaction(s): fatigue  . Lisinopril     Other reaction(s): cough  . Macrolides And Ketolides     Other reaction(s): rash  . Codeine Nausea Only    Family History  Problem Relation Age of Onset  . Cancer Mother        Breast cancer  . Heart attack Father   . Heart disease Brother      Prior to Admission medications   Medication Sig Start Date End Date Taking? Authorizing Provider  albuterol (PROVENTIL HFA;VENTOLIN HFA) 108 (90 Base) MCG/ACT inhaler Inhale 2 puffs into the lungs every 6 (six) hours as needed for wheezing or shortness of breath. 06/15/18  Yes Purohit, Shrey C, MD  amLODipine (NORVASC) 5 MG tablet Take 5 mg by mouth daily.  06/24/18  Yes [provider]  cetirizine (ZYRTEC) 10 MG tablet Take 10 mg by mouth at bedtime.    Yes [provider]  Cholecalciferol (VITAMIN D3) 2000 UNITS capsule Take 2,000 Units by mouth every other day.    Yes [provider]  Coenzyme Q10 (CO Q 10) 100 MG CAPS Take 100 mg by mouth daily.   Yes [provider]  esomeprazole (NEXIUM) 20 MG capsule Take 20 mg by mouth daily.    Yes [provider]  fluocinonide (LIDEX) 0.05 % external solution Apply 1 application topically daily. 20 drops or 1 ml on the scalp 02/13/20  Yes [provider]  fluticasone (FLONASE) 50 MCG/ACT nasal spray Place 2 sprays into both nostrils at bedtime.  04/16/13  Yes [provider]  gabapentin (NEURONTIN) 300 MG capsule Take 2 capsules (600 mg total) by mouth at bedtime. 12/04/17  Yes Kathrynn Ducking, MD  losartan (COZAAR) 100 MG tablet Take 100 mg by mouth at bedtime.  05/22/17  Yes [provider]  mometasone (ELOCON) 0.1 % cream Apply 1 application topically daily. 11/28/19  Yes [provider]  ondansetron (ZOFRAN) 4 MG tablet Take 4 mg by mouth 3 (three) times daily as needed for nausea or vomiting. 01/05/20  Yes [provider]  saccharomyces boulardii (FLORASTOR) 250 MG capsule  Take 250 mg by mouth 2 (two) times daily.   Yes [provider]  tamsulosin (FLOMAX) 0.4 MG CAPS Take 0.4 mg by mouth at bedtime.    Yes [provider]  traMADol (ULTRAM) 50 MG tablet Take 2 tablets (100 mg total) by mouth daily. Patient taking differently: Take 100 mg by mouth at bedtime. 06/15/18  Yes Purohit, Konrad Dolores, MD  vitamin B-12 (CYANOCOBALAMIN) 1000 MCG tablet Take 1,000 mcg by mouth daily.    Yes [provider]  polyethylene glycol (MIRALAX / GLYCOLAX) packet Take 17 g by mouth 2 (two) times daily. Patient not taking: No sig reported 06/15/18   Cristy Folks, MD    Physical Exam: Vitals:   07/25/20 1930 07/25/20 1945 07/25/20 2000 07/25/20 2106  BP: (!) 169/78 (!) 154/72 (!) 167/80 (!) 178/87  Pulse: 93 100 94 97  Resp: 20 18 16 16   Temp:      TempSrc:      SpO2: 97% 96% 97% 97%  Weight:      Height:  Constitutional: NAD, calm, comfortable early gentleman laying flat in bed Vitals:   07/25/20 1930 07/25/20 1945 07/25/20 2000 07/25/20 2106  BP: (!) 169/78 (!) 154/72 (!) 167/80 (!) 178/87  Pulse: 93 100 94 97  Resp: 20 18 16 16   Temp:      TempSrc:      SpO2: 97% 96% 97% 97%  Weight:      Height:       Eyes: PERRL, lids and conjunctivae normal ENMT: Mucous membranes are moist. Neck: normal, supple Respiratory: clear to auscultation bilaterally, no wheezing, no crackles. Normal respiratory effort. No accessory muscle use.  Cardiovascular: Regular rate and rhythm, no murmurs / rubs / gallops.  +2 distal ankle and pretibial edema of the lower extremity bilaterally.   Abdomen: no tendernes but moderately distended , no masses palpated. Bowel sounds positive.  Musculoskeletal: no clubbing / cyanosis. No joint deformity upper and lower extremities. Good ROM, no contractures. Normal muscle tone.  Skin: no rashes, lesions, ulcers. No induration Neurologic: CN 2-12 grossly intact. Sensation intact, Strength 5/5 in all 4.  Psychiatric:  Normal judgment and insight. Alert and oriented x 3. Normal mood.     Labs on Admission: I have personally reviewed following labs and imaging studies  CBC: Recent Labs  Lab 07/25/20 1945  WBC 10.2  NEUTROABS 7.6  HGB 15.6  HCT 47.7  MCV 88.3  PLT 782   Basic Metabolic Panel: Recent Labs  Lab 07/25/20 1945  NA 136  K 4.4  CL 104  CO2 23  GLUCOSE 152*  BUN 22  CREATININE 1.41*  CALCIUM 9.0   GFR: Estimated Creatinine Clearance: 38.8 mL/min (A) (by C-G formula based on SCr of 1.41 mg/dL (H)). Liver Function Tests: Recent Labs  Lab 07/25/20 1945  AST 76*  ALT 53*  ALKPHOS 81  BILITOT 1.6*  PROT 8.8*  ALBUMIN 5.1*   Recent Labs  Lab 07/25/20 1945  LIPASE 5,107*   No results for input(s): AMMONIA in the last 168 hours. Coagulation Profile: No results for input(s): INR, PROTIME in the last 168 hours. Cardiac Enzymes: No results for input(s): CKTOTAL, CKMB, CKMBINDEX, TROPONINI in the last 168 hours. BNP (last 3 results) No results for input(s): PROBNP in the last 8760 hours. HbA1C: No results for input(s): HGBA1C in the last 72 hours. CBG: No results for input(s): GLUCAP in the last 168 hours. Lipid Profile: Recent Labs    07/25/20 1945  TRIG 453*   Thyroid Function Tests: No results for input(s): TSH, T4TOTAL, FREET4, T3FREE, THYROIDAB in the last 72 hours. Anemia Panel: No results for input(s): VITAMINB12, FOLATE, FERRITIN, TIBC, IRON, RETICCTPCT in the last 72 hours. Urine analysis:    Component Value Date/Time   COLORURINE YELLOW 06/06/2018 1015   APPEARANCEUR CLEAR 06/06/2018 1015   LABSPEC 1.015 06/06/2018 1015   PHURINE 7.5 06/06/2018 1015   GLUCOSEU NEGATIVE 06/06/2018 1015   HGBUR NEGATIVE 06/06/2018 1015   BILIRUBINUR NEGATIVE 06/06/2018 1015   [place] 06/06/2018 1015   PROTEINUR 30 (A) 06/06/2018 1015   NITRITE NEGATIVE 06/06/2018 1015   LEUKOCYTESUR NEGATIVE 06/06/2018 1015    Radiological Exams on Admission: CT  ABDOMEN PELVIS W CONTRAST  Result Date: 07/25/2020 CLINICAL DATA:  87 year old male with epigastric pain. EXAM: CT ABDOMEN AND PELVIS WITH CONTRAST TECHNIQUE: Multidetector CT imaging of the abdomen and pelvis was performed using the standard protocol following bolus administration of intravenous contrast. CONTRAST:  144mL OMNIPAQUE IOHEXOL 300 MG/ML  SOLN COMPARISON:  CT abdomen pelvis dated 12/05/2019.  FINDINGS: Lower chest: Bibasilar atelectasis. The visualized lung bases are otherwise clear. No intra-abdominal free air or free fluid. Hepatobiliary: Diffuse fatty liver. Multiple hepatic cysts and additional subcentimeter hypodense lesions which are too small to characterize. No intrahepatic biliary dilatation. The gallbladder is unremarkable. Pancreas: There is inflammatory changes of the pancreas predominantly involving the head and uncinate process of the pancreas consistent with acute pancreatitis. No drainable fluid collection or abscess/pseudocyst. Spleen: Normal in size without focal abnormality. Adrenals/Urinary Tract: The adrenal glands are unremarkable. There is no hydronephrosis on either side. There is symmetric enhancement and excretion of contrast by both kidneys. Subcentimeter left renal hypodense lesions are too small to characterize. The visualized ureters and urinary bladder appear unremarkable. Stomach/Bowel: There is sigmoid diverticulosis without active inflammatory changes. There is no bowel obstruction or active inflammation. The appendix is normal. Vascular/Lymphatic: Mild aortoiliac atherosclerotic disease. The IVC is unremarkable. No portal venous gas. The SMV, splenic vein, and main portal vein are patent. There is no adenopathy. Reproductive: Enlarged prostate gland with median lobe hypertrophy. Other: None Musculoskeletal: Osteopenia with degenerative changes of the spine. No acute osseous pathology. Old L1 compression fracture with anterior wedging. IMPRESSION: 1. Acute pancreatitis.  No abscess or abscess/pseudocyst. 2. Fatty liver. 3. Sigmoid diverticulosis. 4. Aortic Atherosclerosis (ICD10-I70.0). Electronically Signed   By: Anner Crete M.D.   On: 07/25/2020 20:57      Assessment/Plan  Acute pancreatitis -History of idiopathic pancreatitis -Lipase of greater than 5000.  Patient does not use alcohol.  Triglyceride only borderline at 453.  Gallbladder unremarkable with no biliary dilatation. -Total bilirubin elevated but mostly unconjugated -Aggressive IV fluid management -PRN pain and antiemetic management - NPO and can advance as tolerate tomorrow  HTN -continue amlodipine - Hold Losartan for possible AKI - PRN IV hydralazine for greater than systolic greater than 659  AKI on CKD stage 3a probable AKI -Cr of 1.42 with no recent prior - follow with repeat labs in the morning with fluids - hold Losartan   hereditary peripheral neuropathy with foot drop -continue gapapentin  OSA -continue CPAP   DVT prophylaxis:.Lovenox Code Status: Full Family Communication: Plan discussed with patient and daughter at bedside  disposition Plan: Home with observation Consults called:  Admission status: Observation    Admission status: obs  Level of care: Med-Surg  Status is: Observation  The patient remains OBS appropriate and will d/c before 2 midnights.  Dispo: The patient is from: Home              Anticipated d/c is to: Home              Patient currently is not medically stable to d/c.   Difficult to place patient No         Orene Desanctis DO [facility]   If 7PM-7AM, please contact night-coverage [email or web address]   07/25/2020, 10:57 PM

## 2020-07-26 ENCOUNTER — Encounter (HOSPITAL_COMMUNITY): Payer: Self-pay | Admitting: Family Medicine

## 2020-07-26 ENCOUNTER — Other Ambulatory Visit: Payer: Self-pay

## 2020-07-26 DIAGNOSIS — I129 Hypertensive chronic kidney disease with stage 1 through stage 4 chronic kidney disease, or unspecified chronic kidney disease: Secondary | ICD-10-CM | POA: Diagnosis present

## 2020-07-26 DIAGNOSIS — Z87820 Personal history of traumatic brain injury: Secondary | ICD-10-CM | POA: Diagnosis not present

## 2020-07-26 DIAGNOSIS — M21372 Foot drop, left foot: Secondary | ICD-10-CM | POA: Diagnosis present

## 2020-07-26 DIAGNOSIS — I700 Atherosclerosis of aorta: Secondary | ICD-10-CM | POA: Diagnosis present

## 2020-07-26 DIAGNOSIS — Z87891 Personal history of nicotine dependence: Secondary | ICD-10-CM | POA: Diagnosis not present

## 2020-07-26 DIAGNOSIS — K760 Fatty (change of) liver, not elsewhere classified: Secondary | ICD-10-CM | POA: Diagnosis present

## 2020-07-26 DIAGNOSIS — E785 Hyperlipidemia, unspecified: Secondary | ICD-10-CM | POA: Diagnosis present

## 2020-07-26 DIAGNOSIS — K859 Acute pancreatitis without necrosis or infection, unspecified: Secondary | ICD-10-CM | POA: Diagnosis not present

## 2020-07-26 DIAGNOSIS — I447 Left bundle-branch block, unspecified: Secondary | ICD-10-CM | POA: Diagnosis present

## 2020-07-26 DIAGNOSIS — Z20822 Contact with and (suspected) exposure to covid-19: Secondary | ICD-10-CM | POA: Diagnosis present

## 2020-07-26 DIAGNOSIS — G629 Polyneuropathy, unspecified: Secondary | ICD-10-CM | POA: Diagnosis present

## 2020-07-26 DIAGNOSIS — N179 Acute kidney failure, unspecified: Secondary | ICD-10-CM | POA: Diagnosis present

## 2020-07-26 DIAGNOSIS — M21371 Foot drop, right foot: Secondary | ICD-10-CM | POA: Diagnosis present

## 2020-07-26 DIAGNOSIS — G4733 Obstructive sleep apnea (adult) (pediatric): Secondary | ICD-10-CM | POA: Diagnosis present

## 2020-07-26 DIAGNOSIS — G609 Hereditary and idiopathic neuropathy, unspecified: Secondary | ICD-10-CM | POA: Diagnosis present

## 2020-07-26 DIAGNOSIS — K5730 Diverticulosis of large intestine without perforation or abscess without bleeding: Secondary | ICD-10-CM | POA: Diagnosis present

## 2020-07-26 DIAGNOSIS — K219 Gastro-esophageal reflux disease without esophagitis: Secondary | ICD-10-CM | POA: Diagnosis present

## 2020-07-26 DIAGNOSIS — Z79891 Long term (current) use of opiate analgesic: Secondary | ICD-10-CM | POA: Diagnosis not present

## 2020-07-26 DIAGNOSIS — N1831 Chronic kidney disease, stage 3a: Secondary | ICD-10-CM | POA: Diagnosis present

## 2020-07-26 DIAGNOSIS — N400 Enlarged prostate without lower urinary tract symptoms: Secondary | ICD-10-CM | POA: Diagnosis present

## 2020-07-26 DIAGNOSIS — Z85828 Personal history of other malignant neoplasm of skin: Secondary | ICD-10-CM | POA: Diagnosis not present

## 2020-07-26 DIAGNOSIS — Z8249 Family history of ischemic heart disease and other diseases of the circulatory system: Secondary | ICD-10-CM | POA: Diagnosis not present

## 2020-07-26 DIAGNOSIS — Z79899 Other long term (current) drug therapy: Secondary | ICD-10-CM | POA: Diagnosis not present

## 2020-07-26 LAB — COMPREHENSIVE METABOLIC PANEL
ALT: 112 U/L — ABNORMAL HIGH (ref 0–44)
AST: 95 U/L — ABNORMAL HIGH (ref 15–41)
Albumin: 4.1 g/dL (ref 3.5–5.0)
Alkaline Phosphatase: 67 U/L (ref 38–126)
Anion gap: 9 (ref 5–15)
BUN: 17 mg/dL (ref 8–23)
CO2: 27 mmol/L (ref 22–32)
Calcium: 8.4 mg/dL — ABNORMAL LOW (ref 8.9–10.3)
Chloride: 104 mmol/L (ref 98–111)
Creatinine, Ser: 1.21 mg/dL (ref 0.61–1.24)
GFR, Estimated: 58 mL/min — ABNORMAL LOW (ref >60)
Glucose, Bld: 150 mg/dL — ABNORMAL HIGH (ref 70–99)
Potassium: 4.8 mmol/L (ref 3.5–5.1)
Sodium: 140 mmol/L (ref 135–145)
Total Bilirubin: 0.9 mg/dL (ref 0.3–1.2)
Total Protein: 7.7 g/dL (ref 6.5–8.1)

## 2020-07-26 LAB — CBC
HCT: 49.3 % (ref 39.0–52.0)
Hemoglobin: 16.1 g/dL (ref 13.0–17.0)
MCH: 29.2 pg (ref 26.0–34.0)
MCHC: 32.7 g/dL (ref 30.0–36.0)
MCV: 89.3 fL (ref 80.0–100.0)
Platelets: 246 10*3/uL (ref 150–400)
RBC: 5.52 MIL/uL (ref 4.22–5.81)
RDW: 12.6 % (ref 11.5–15.5)
WBC: 25.5 10*3/uL — ABNORMAL HIGH (ref 4.0–10.5)
nRBC: 0 % (ref 0.0–0.2)

## 2020-07-26 LAB — TSH: TSH: 0.698 u[IU]/mL (ref 0.350–4.500)

## 2020-07-26 LAB — LIPASE, BLOOD: Lipase: 519 U/L — ABNORMAL HIGH (ref 11–51)

## 2020-07-26 MED ORDER — ACETAMINOPHEN 325 MG PO TABS
650.0000 mg | ORAL_TABLET | ORAL | Status: DC | PRN
  Administered 2020-07-26: 650 mg via ORAL
  Filled 2020-07-26: qty 2

## 2020-07-26 MED ORDER — SODIUM CHLORIDE 0.9 % IV SOLN: INTRAVENOUS | Status: DC

## 2020-07-26 MED ORDER — FLUTICASONE PROPIONATE 50 MCG/ACT NA SUSP: 2.0000 | Freq: Every day | NASAL | Status: DC

## 2020-07-26 NOTE — Progress Notes (Signed)
Patient was Yellow MEWS in the ED. Arrived to the floor and was still yellow MEWS on VS check. Follow current POC and admission vital signs (Q4h).

## 2020-07-26 NOTE — Consult Note (Addendum)
Referring Provider: Dr. Langston Masker (ED) Primary Care Physician:  [name], MD Primary Gastroenterologist:  Dr. Therisa Doyne [facility])  Reason for Consultation:  Acute pancreatitis  HPI: Timothy Khan is a 87 y.o. male with history of idiopathic pancreatitis, HTN, and hyperlipidemia presenting for consultation of acute pancreatitis.    Patient states he was in his usual state of health until yesterday, when he started experiencing severe epigastric abdominal pain, nausea, and vomiting, and thus presented to the ED.  CT revealed acute pancreatitis.  Lipase 5107 on arrival.  Today, he states his pain is much better and is currently only a 2 out of 10.  Denies any current nausea.  Denies changes in bowel habits, melena, or hematochezia.  However, he has not had a bowel movement over the past 2 days.  He also had an episode of idiopathic pancreatitis in 2020.  He denies any new medications.  He denies any alcohol use.  EGD 02/2020 for esophageal thickening on CT: Tortuous esophagus. Biopsied. Gastritis    Past Medical History:  Diagnosis Date  . Abnormal brain MRI    Right side  . Basal cell carcinoma of back   . BPH (benign prostatic hyperplasia)   . Concussion 1950   Baseball injury  . Foot drop, bilateral 05/02/2013  . Gait disturbance   . Gastroesophageal reflux disease   . Hyperlipidemia   . Hypertension   . Lumbago   . Peripheral neuropathy   . Skull fracture ([place])   . Sleep apnea     Past Surgical History:  Procedure Laterality Date  . BIOPSY  03/01/2020   Procedure: BIOPSY;  Surgeon: Ronnette Juniper, MD;  Location: [facility];  Service: Gastroenterology;;  . CATARACT EXTRACTION Bilateral   . ESOPHAGEAL MANOMETRY N/A 01/05/2020   Procedure: ESOPHAGEAL MANOMETRY (EM);  Surgeon: Ronnette Juniper, MD;  Location: [facility];  Service: Gastroenterology;  Laterality: N/A;  . ESOPHAGOGASTRODUODENOSCOPY (EGD) WITH PROPOFOL N/A 06/11/2018   Procedure: ESOPHAGOGASTRODUODENOSCOPY  (EGD) WITH PROPOFOL;  Surgeon: Clarene Essex, MD;  Location: [facility];  Service: Endoscopy;  Laterality: N/A;  . ESOPHAGOGASTRODUODENOSCOPY (EGD) WITH PROPOFOL N/A 03/01/2020   Procedure: ESOPHAGOGASTRODUODENOSCOPY (EGD) WITH PROPOFOL With Botox injections;  Surgeon: Ronnette Juniper, MD;  Location: [facility];  Service: Gastroenterology;  Laterality: N/A;  . KNEE SURGERY Left   . skin cancer resection     basal cell, [place] carcinoma  . SUBMUCOSAL INJECTION  03/01/2020   Procedure: SUBMUCOSAL INJECTION;  Surgeon: Ronnette Juniper, MD;  Location: [facility];  Service: Gastroenterology;;  . TONSILLECTOMY    . VASECTOMY      Prior to Admission medications   Medication Sig Start Date End Date Taking? Authorizing Provider  albuterol (PROVENTIL HFA;VENTOLIN HFA) 108 (90 Base) MCG/ACT inhaler Inhale 2 puffs into the lungs every 6 (six) hours as needed for wheezing or shortness of breath. 06/15/18  Yes Purohit, Shrey C, MD  amLODipine (NORVASC) 5 MG tablet Take 5 mg by mouth daily.  06/24/18  Yes [provider]  cetirizine (ZYRTEC) 10 MG tablet Take 10 mg by mouth at bedtime.    Yes [provider]  Cholecalciferol (VITAMIN D3) 2000 UNITS capsule Take 2,000 Units by mouth every other day.    Yes [provider]  Coenzyme Q10 (CO Q 10) 100 MG CAPS Take 100 mg by mouth daily.   Yes [provider]  esomeprazole (NEXIUM) 20 MG capsule Take 20 mg by mouth daily.    Yes [provider]  fluocinonide (LIDEX) 0.05 % external  solution Apply 1 application topically daily. 20 drops or 1 ml on the scalp 02/13/20  Yes [provider]  fluticasone (FLONASE) 50 MCG/ACT nasal spray Place 2 sprays into both nostrils at bedtime.  04/16/13  Yes [provider]  gabapentin (NEURONTIN) 300 MG capsule Take 2 capsules (600 mg total) by mouth at bedtime. 12/04/17  Yes Kathrynn Ducking, MD  losartan (COZAAR) 100 MG tablet Take 100 mg by mouth at bedtime.  05/22/17  Yes  [provider]  mometasone (ELOCON) 0.1 % cream Apply 1 application topically daily. 11/28/19  Yes [provider]  ondansetron (ZOFRAN) 4 MG tablet Take 4 mg by mouth 3 (three) times daily as needed for nausea or vomiting. 01/05/20  Yes [provider]  saccharomyces boulardii (FLORASTOR) 250 MG capsule Take 250 mg by mouth 2 (two) times daily.   Yes [provider]  tamsulosin (FLOMAX) 0.4 MG CAPS Take 0.4 mg by mouth at bedtime.    Yes [provider]  traMADol (ULTRAM) 50 MG tablet Take 2 tablets (100 mg total) by mouth daily. Patient taking differently: Take 100 mg by mouth at bedtime. 06/15/18  Yes Purohit, Konrad Dolores, MD  vitamin B-12 (CYANOCOBALAMIN) 1000 MCG tablet Take 1,000 mcg by mouth daily.    Yes [provider]  polyethylene glycol (MIRALAX / GLYCOLAX) packet Take 17 g by mouth 2 (two) times daily. Patient not taking: No sig reported 06/15/18   Purohit, Konrad Dolores, MD    Scheduled Meds: . amLODipine  5 mg Oral Daily  . [START ON 07/27/2020] cholecalciferol  2,000 Units Oral QODAY  . fluticasone  2 spray Each Nare QHS  . gabapentin  600 mg Oral QHS  . loratadine  10 mg Oral Daily  . pantoprazole  40 mg Oral Daily  . saccharomyces boulardii  250 mg Oral BID  . tamsulosin  0.4 mg Oral QHS  . traMADol  100 mg Oral QHS  . vitamin B-12  1,000 mcg Oral Daily   Continuous Infusions: . sodium chloride 150 mL/hr at 07/26/20 0609  . promethazine (PHENERGAN) injection (IM or IVPB) 12.5 mg (07/25/20 2222)   PRN Meds:.albuterol, hydrALAZINE, morphine injection, promethazine (PHENERGAN) injection (IM or IVPB)  Allergies as of 07/25/2020 - Review Complete 07/25/2020  Allergen Reaction Noted  . Doxazosin mesylate  07/22/2020  . Lisinopril  07/22/2020  . Macrolides and ketolides  07/22/2020  . Codeine Nausea Only 03/18/2012    Family History  Problem Relation Age of Onset  . Cancer Mother        Breast cancer  . Heart attack Father    . Heart disease Brother     Social History   Socioeconomic History  . Marital status: Married    Spouse name: Not on file  . Number of children: 4  . Years of education: 38  . Highest education level: Not on file  Occupational History  . Occupation: Retired  Tobacco Use  . Smoking status: Former Smoker    Quit date: 08/18/1984    Years since quitting: 35.9  . Smokeless tobacco: Never Used  Vaping Use  . Vaping Use: Never used  Substance and Sexual Activity  . Alcohol use: No  . Drug use: No  . Sexual activity: Not on file  Other Topics Concern  . Not on file  Social History Narrative   Patient states he is ambidextrous but is predominantly left handed.   Patient drinks 3 cups of coffee per day.   Social  Determinants of Health   Financial Resource Strain: Not on file  Food Insecurity: Not on file  Transportation Needs: Not on file  Physical Activity: Not on file  Stress: Not on file  Social Connections: Not on file  Intimate Partner Violence: Not on file    Review of Systems: Review of Systems  Constitutional: Negative for chills, fever and weight loss.  HENT: Negative for sinus pain and sore throat.   Eyes: Negative for pain and redness.  Respiratory: Negative for cough and shortness of breath.   Cardiovascular: Negative for chest pain and palpitations.  Gastrointestinal: Positive for abdominal pain. Negative for blood in stool, constipation, diarrhea, heartburn, melena, nausea and vomiting.  Genitourinary: Negative for flank pain and hematuria.  Musculoskeletal: Negative for falls and joint pain.  Skin: Negative for itching and rash.  Neurological: Negative for seizures and loss of consciousness.  Endo/Heme/Allergies: Negative for polydipsia. Does not bruise/bleed easily.  Psychiatric/Behavioral: Negative for substance abuse. The patient is not nervous/anxious.      Physical Exam: Vital signs: Vitals:   07/25/20 2340 07/26/20 0533  BP: (!) 144/88 (!)  162/76  Pulse: (!) 133 (!) 109  Resp: 17 16  Temp: (!) 97.4 F (36.3 C) 98.6 F (37 C)  SpO2: 95% 92%   Last BM Date: 07/25/20  Physical Exam Vitals reviewed.  Constitutional:      General: He is not in acute distress. HENT:     Head: Normocephalic and atraumatic.     Nose: Nose normal. No congestion.     Mouth/Throat:     Mouth: Mucous membranes are moist.     Pharynx: Oropharynx is clear.  Eyes:     General: No scleral icterus.    Extraocular Movements: Extraocular movements intact.  Cardiovascular:     Rate and Rhythm: Regular rhythm. Tachycardia present.     Pulses: Normal pulses.  Pulmonary:     Effort: Pulmonary effort is normal.     Breath sounds: Normal breath sounds.  Abdominal:     General: Bowel sounds are normal. There is no distension.     Palpations: Abdomen is soft. There is no mass.     Tenderness: There is no abdominal tenderness. There is no guarding or rebound.     Hernia: No hernia is present.  Musculoskeletal:        General: No swelling or tenderness.     Cervical back: Normal range of motion and neck supple.  Skin:    General: Skin is warm and dry.  Neurological:     General: No focal deficit present.     Mental Status: He is oriented to person, place, and time. He is lethargic.  Psychiatric:        Mood and Affect: Mood normal.        Behavior: Behavior normal. Behavior is cooperative.    GI:  Lab Results: Recent Labs    07/25/20 1945 07/26/20 0526  WBC 10.2 25.5*  HGB 15.6 16.1  HCT 47.7 49.3  PLT 238 246   BMET Recent Labs    07/25/20 1945 07/26/20 0526  NA 136 140  K 4.4 4.8  CL 104 104  CO2 23 27  GLUCOSE 152* 150*  BUN 22 17  CREATININE 1.41* 1.21  CALCIUM 9.0 8.4*   LFT Recent Labs    07/25/20 1945 07/26/20 0526  PROT 8.8* 7.7  ALBUMIN 5.1* 4.1  AST 76* 95*  ALT 53* 112*  ALKPHOS 81 67  BILITOT 1.6* 0.9  BILIDIR  0.6*  --   IBILI 1.0*  --    PT/INR No results for input(s): LABPROT, INR in the last 72  hours.   Studies/Results: CT ABDOMEN PELVIS W CONTRAST  Result Date: 07/25/2020 CLINICAL DATA:  87 year old male with epigastric pain. EXAM: CT ABDOMEN AND PELVIS WITH CONTRAST TECHNIQUE: Multidetector CT imaging of the abdomen and pelvis was performed using the standard protocol following bolus administration of intravenous contrast. CONTRAST:  13mL OMNIPAQUE IOHEXOL 300 MG/ML  SOLN COMPARISON:  CT abdomen pelvis dated 12/05/2019. FINDINGS: Lower chest: Bibasilar atelectasis. The visualized lung bases are otherwise clear. No intra-abdominal free air or free fluid. Hepatobiliary: Diffuse fatty liver. Multiple hepatic cysts and additional subcentimeter hypodense lesions which are too small to characterize. No intrahepatic biliary dilatation. The gallbladder is unremarkable. Pancreas: There is inflammatory changes of the pancreas predominantly involving the head and uncinate process of the pancreas consistent with acute pancreatitis. No drainable fluid collection or abscess/pseudocyst. Spleen: Normal in size without focal abnormality. Adrenals/Urinary Tract: The adrenal glands are unremarkable. There is no hydronephrosis on either side. There is symmetric enhancement and excretion of contrast by both kidneys. Subcentimeter left renal hypodense lesions are too small to characterize. The visualized ureters and urinary bladder appear unremarkable. Stomach/Bowel: There is sigmoid diverticulosis without active inflammatory changes. There is no bowel obstruction or active inflammation. The appendix is normal. Vascular/Lymphatic: Mild aortoiliac atherosclerotic disease. The IVC is unremarkable. No portal venous gas. The SMV, splenic vein, and main portal vein are patent. There is no adenopathy. Reproductive: Enlarged prostate gland with median lobe hypertrophy. Other: None Musculoskeletal: Osteopenia with degenerative changes of the spine. No acute osseous pathology. Old L1 compression fracture with anterior wedging.  IMPRESSION: 1. Acute pancreatitis. No abscess or abscess/pseudocyst. 2. Fatty liver. 3. Sigmoid diverticulosis. 4. Aortic Atherosclerosis (ICD10-I70.0). Electronically Signed   By: Anner Crete M.D.   On: 07/25/2020 20:57    Impression: Acute pancreatitis, unknown etiology.  ?drug-induced (losartan), Triglycerides elevated to 453.  Normal gallbladder on CT imaging, normal LFTs.  No alcohol use. -Lipase 519, decreased from 5107 yesterday -BUN 17/ Cr 1.21 -Leukocytosis, likely reactive, WBCs 25.5 today  Plan: Continue IV fluids at 150 cc/h.  Clear liquid diet.  Continue supportive care with anti-emetics and pain control as needed.  [facility] will follow.   LOS: 0 days   Salley Detty  PA-C 07/26/2020, 8:35 AM  Contact #  [phone]

## 2020-07-26 NOTE — Progress Notes (Signed)
PROGRESS NOTE  [name]  DOB: 1931/07/30  PCP: [name], MD CBU:[id number]  DOA: 07/25/2020  LOS: 0 days  Hospital Day: 2   Chief Complaint  Patient presents with  . Abdominal Pain  . Emesis   Brief narrative: DELRICO MINEHART is a 87 y.o. male with PMH significant for idiopathic pancreatitis, peripheral neuropathy with foot drop, subdural hematoma, hypertension, hyperlipidemia, CKD stage IIIa, OSA on CPAP, GERD. Patient presented to the ED on 5/8 with acute onset epigastric and later diffuse abdominal pain with persistent nausea and vomiting up to 8 episodes.   He does not drink alcohol. He had idiopathic pancreatitis in 2020   In the ED, patient was afebrile, hypertensive with systolic blood pressure up to 190s. Labs with unremarkable CBC, BMP with creatinine elevated to 1.41, lipase level elevated to more than 5000, mildly elevated AST and ALT with normal alk phos and total bilirubin LDH elevated to 385, triglyceride at 453 CT abdomen showed showed inflammatory changes of the head and uncinate process of the pancreas. Gallbladder was unremarkable.  No intrahepatic biliary dilatation was seen.  Subjective: Patient was seen and examined this morning. Chart reviewed Blood pressure elevated to 160s Labs with creatinine down to 1.1, lipase level pending, WBC count up significantly to 25,000 this morning.  Assessment/Plan: Acute pancreatitis History of idiopathic pancreatitis -No history of alcohol use.  No evidence of gallstone. Triglyceride only borderline at 453.  Gallbladder unremarkable with no biliary dilatation. -Continue conservative management with IV fluid, n.p.o. status, pain medicine, antiemetics. -Lipase level is significantly better at 519 today. -GI consult obtained.  Clear liquid diet started. Recent Labs  Lab 07/25/20 1945 07/26/20 0526  LIPASE 5,107* 519*   AKI on CKD stage 3a -Creatinine improving with hydration. -Keep losartan on  hold. Recent Labs    07/25/20 1945 07/26/20 0526  BUN 22 17  CREATININE 1.41* 1.21   Essential hypertension -continue amlodipine.  Losartan on hold. -PRN IV hydralazine for greater than systolic greater than 032  Peripheral neuropathy with foot drop -continue gapapentin  OSA -continue CPAP  BPH -Continue Flomax 0.4 mg daily at bedtime  Mobility: Encourage ambulation Code Status:   Code Status: Full Code  Nutritional status: Body mass index is 25.83 kg/m.     Diet Order            Diet clear liquid Room service appropriate? Yes; Fluid consistency: Thin  Diet effective now                 DVT prophylaxis: SCDs Start: 07/25/20 2210   Antimicrobials:  None Fluid: Normal saline 150 mill per hour Consultants: GI Family Communication:  Wife at bedside  Status is: Inpatient  Remains inpatient appropriate because: Continues to need IV fluid  Dispo: The patient is from: Home              Anticipated d/c is to: Home in 1 to 2 days              Patient currently is not medically stable to d/c.   Difficult to place patient No     Infusions:  . sodium chloride 150 mL/hr at 07/26/20 0609  . promethazine (PHENERGAN) injection (IM or IVPB) 12.5 mg (07/25/20 2222)    Scheduled Meds: . amLODipine  5 mg Oral Daily  . [START ON 07/27/2020] cholecalciferol  2,000 Units Oral QODAY  . fluticasone  2 spray Each Nare QHS  . gabapentin  600 mg Oral QHS  .  loratadine  10 mg Oral Daily  . pantoprazole  40 mg Oral Daily  . saccharomyces boulardii  250 mg Oral BID  . tamsulosin  0.4 mg Oral QHS  . traMADol  100 mg Oral QHS  . vitamin B-12  1,000 mcg Oral Daily    Antimicrobials: Anti-infectives (From admission, onward)   None      PRN meds: albuterol, hydrALAZINE, morphine injection, promethazine (PHENERGAN) injection (IM or IVPB)   Objective: Vitals:   07/26/20 0533 07/26/20 0924  BP: (!) 162/76 (!) 142/72  Pulse: (!) 109 (!) 107  Resp: 16 16  Temp: 98.6 F  (37 C) 98.4 F (36.9 C)  SpO2: 92% 94%    Intake/Output Summary (Last 24 hours) at 07/26/2020 1057 Last data filed at 07/26/2020 0610 Gross per 24 hour  Intake 1069.32 ml  Output 100 ml  Net 969.32 ml   Filed Weights   07/25/20 1914  Weight: 81.6 kg   Weight change:  Body mass index is 25.83 kg/m.   Physical Exam: General exam: Pleasant, elderly Caucasian male.  Lying on bed.  Not in distress Skin: No rashes, lesions or ulcers. HEENT: Atraumatic, normocephalic, no obvious bleeding Lungs: Clear to auscultation bilaterally CVS: Regular rate and rhythm, no murmur GI/Abd soft, mild epigastric tenderness present, bowel sound present CNS: Alert, awake, oriented x3 Psychiatry: Mood appropriate Extremities: No pedal edema, no calf tenderness  Data Review: I have personally reviewed the laboratory data and studies available.  Recent Labs  Lab 07/25/20 1945 07/26/20 0526  WBC 10.2 25.5*  NEUTROABS 7.6  --   HGB 15.6 16.1  HCT 47.7 49.3  MCV 88.3 89.3  PLT 238 246   Recent Labs  Lab 07/25/20 1945 07/26/20 0526  NA 136 140  K 4.4 4.8  CL 104 104  CO2 23 27  GLUCOSE 152* 150*  BUN 22 17  CREATININE 1.41* 1.21  CALCIUM 9.0 8.4*    F/u labs ordered Unresulted Labs (From admission, onward)          Start     Ordered   07/27/20 0500  CBC with Differential/Platelet  Daily,   R     Question:  Specimen collection method  Answer:  Lab=Lab collect   07/26/20 0837   07/27/20 0500  Lipase, blood  Daily,   R     Question:  Specimen collection method  Answer:  Lab=Lab collect   07/26/20 0837   07/27/20 0500  Comprehensive metabolic panel  Daily,   R     Question:  Specimen collection method  Answer:  Lab=Lab collect   07/26/20 0837   07/27/20 0500  Magnesium  Tomorrow morning,   STAT       Question:  Specimen collection method  Answer:  Lab=Lab collect   07/26/20 0837   07/27/20 0500  Phosphorus  Tomorrow morning,   R       Question:  Specimen collection method  Answer:   Lab=Lab collect   07/26/20 3833          Signed, Terrilee Croak, MD [facility] 07/26/2020

## 2020-07-27 ENCOUNTER — Encounter (HOSPITAL_COMMUNITY): Payer: Self-pay | Admitting: Family Medicine

## 2020-07-27 LAB — COMPREHENSIVE METABOLIC PANEL
ALT: 64 U/L — ABNORMAL HIGH (ref 0–44)
AST: 38 U/L (ref 15–41)
Albumin: 3.1 g/dL — ABNORMAL LOW (ref 3.5–5.0)
Alkaline Phosphatase: 46 U/L (ref 38–126)
Anion gap: 3 — ABNORMAL LOW (ref 5–15)
BUN: 14 mg/dL (ref 8–23)
CO2: 27 mmol/L (ref 22–32)
Calcium: 7.8 mg/dL — ABNORMAL LOW (ref 8.9–10.3)
Chloride: 106 mmol/L (ref 98–111)
Creatinine, Ser: 1.15 mg/dL (ref 0.61–1.24)
GFR, Estimated: >60 mL/min (ref >60)
Glucose, Bld: 116 mg/dL — ABNORMAL HIGH (ref 70–99)
Potassium: 4.5 mmol/L (ref 3.5–5.1)
Sodium: 136 mmol/L (ref 135–145)
Total Bilirubin: 1.1 mg/dL (ref 0.3–1.2)
Total Protein: 5.7 g/dL — ABNORMAL LOW (ref 6.5–8.1)

## 2020-07-27 LAB — CBC WITH DIFFERENTIAL/PLATELET
Abs Immature Granulocytes: 0.15 10*3/uL — ABNORMAL HIGH (ref 0.00–0.07)
Basophils Absolute: 0 10*3/uL (ref 0.0–0.1)
Basophils Relative: 0 %
Eosinophils Absolute: 0.1 10*3/uL (ref 0.0–0.5)
Eosinophils Relative: 0 %
HCT: 41.5 % (ref 39.0–52.0)
Hemoglobin: 13.5 g/dL (ref 13.0–17.0)
Immature Granulocytes: 1 %
Lymphocytes Relative: 12 %
Lymphs Abs: 2.1 10*3/uL (ref 0.7–4.0)
MCH: 29.4 pg (ref 26.0–34.0)
MCHC: 32.5 g/dL (ref 30.0–36.0)
MCV: 90.4 fL (ref 80.0–100.0)
Monocytes Absolute: 1.1 10*3/uL — ABNORMAL HIGH (ref 0.1–1.0)
Monocytes Relative: 6 %
Neutro Abs: 14.3 10*3/uL — ABNORMAL HIGH (ref 1.7–7.7)
Neutrophils Relative %: 81 %
Platelets: 181 10*3/uL (ref 150–400)
RBC: 4.59 MIL/uL (ref 4.22–5.81)
RDW: 13.2 % (ref 11.5–15.5)
WBC: 17.7 10*3/uL — ABNORMAL HIGH (ref 4.0–10.5)
nRBC: 0 % (ref 0.0–0.2)

## 2020-07-27 LAB — LIPASE, BLOOD: Lipase: 156 U/L — ABNORMAL HIGH (ref 11–51)

## 2020-07-27 LAB — MAGNESIUM: Magnesium: 1.8 mg/dL (ref 1.7–2.4)

## 2020-07-27 LAB — PHOSPHORUS: Phosphorus: 2 mg/dL — ABNORMAL LOW (ref 2.5–4.6)

## 2020-07-27 MED ORDER — POLYETHYLENE GLYCOL 3350 17 G PO PACK
17.0000 g | PACK | Freq: Every day | ORAL | Status: DC | PRN
  Administered 2020-07-27: 17 g via ORAL
  Filled 2020-07-27: qty 1

## 2020-07-27 MED ORDER — SENNOSIDES-DOCUSATE SODIUM 8.6-50 MG PO TABS
1.0000 | ORAL_TABLET | Freq: Every day | ORAL | Status: DC
  Administered 2020-07-27 (×2): 1 via ORAL
  Filled 2020-07-27 (×2): qty 1

## 2020-07-27 MED ORDER — K PHOS MONO-SOD PHOS DI & MONO 155-852-130 MG PO TABS
500.0000 mg | ORAL_TABLET | Freq: Two times a day (BID) | ORAL | Status: AC
  Administered 2020-07-27 (×2): 500 mg via ORAL
  Filled 2020-07-27 (×2): qty 2

## 2020-07-27 NOTE — Progress Notes (Signed)
[facility] Gastroenterology Progress Note  Timothy Khan 87 y.o. 17-Jan-1932   Subjective: Abdominal pain improving. Denies N/V. Denies BMs overnight. Tolerating clear liquid diet. Has an appetite.  Objective: Vital signs: Vitals:   07/27/20 0209 07/27/20 0616  BP: 121/66 128/64  Pulse: 96 87  Resp: 17 17  Temp: 98.9 F (37.2 C) 98.6 F (37 C)  SpO2: 93% 92%    Physical Exam: Gen: lethargic, elderly, well-nourished, no acute distress  HEENT: anicteric sclera CV: RRR Chest: CTA B Abd: epigastric tenderness, firm, nondistended, +BS Ext: no edema  Lab Results: Recent Labs    07/26/20 0526 07/27/20 0553  NA 140 136  K 4.8 4.5  CL 104 106  CO2 27 27  GLUCOSE 150* 116*  BUN 17 14  CREATININE 1.21 1.15  CALCIUM 8.4* 7.8*  MG  --  1.8  PHOS  --  2.0*   Recent Labs    07/26/20 0526 07/27/20 0553  AST 95* 38  ALT 112* 64*  ALKPHOS 67 46  BILITOT 0.9 1.1  PROT 7.7 5.7*  ALBUMIN 4.1 3.1*   Recent Labs    07/25/20 1945 07/26/20 0526 07/27/20 0553  WBC 10.2 25.5* 17.7*  NEUTROABS 7.6  --  14.3*  HGB 15.6 16.1 13.5  HCT 47.7 49.3 41.5  MCV 88.3 89.3 90.4  PLT 238 246 181   Lipase 156   Assessment/Plan: Acute pancreatitis likely meds-related vs triglycerides - clinically improving. Changed diet to full liquids and advance as tolerated to low fat diet. Encouraged ambulation. Decreased IVFs to 75 cc/hr. Supportive care. If continues to do well then potentially can discharge tomorrow.   Timothy Khan 07/27/2020, 9:27 AM  Questions please call [phone] ID: Timothy Khan, male   DOB: [birth year], 87 y.o.   MRN: [id number]

## 2020-07-27 NOTE — Progress Notes (Signed)
   07/26/20 2229  Assess: MEWS Score  O2 Device Room Air  Treat  MEWS Interventions Administered prn meds/treatments  Take Vital Signs  Increase Vital Sign Frequency  Yellow: Q 2hr X 2 then Q 4hr X 2, if remains yellow, continue Q 4hrs  Escalate  MEWS: Escalate Yellow: discuss with charge nurse/RN and consider discussing with provider and RRT  Notify: Charge Nurse/RN  Name of Charge Nurse/RN Notified [place], RN  Date Charge Nurse/RN Notified 07/26/20  Time Charge Nurse/RN Notified 2230  Notify: Provider  Provider Name/Title Celene Kras, MD  Date Provider Notified 07/26/20  Time Provider Notified 2234  Notification Type Page  Notification Reason Other (Comment) (yellow mews)  Provider response  (awaioting orders)  Date of Provider Response 07/27/20  Time of Provider Response 2300  Document  Patient Outcome Other (Comment) (temp still 101.3)  Progress note created (see row info) Yes  MD notified, medication administered. Medication given and temperature rechecked. No changes to temp 101.5 to 101.3. Will continue to monitor and await orders.

## 2020-07-27 NOTE — Evaluation (Signed)
Physical Therapy Evaluation Patient Details Name: Timothy Khan MRN: [id number] DOB: 1931-06-29 Today's Date: 07/27/2020   History of Present Illness  Timothy Khan is a 87 y.o. male with PMH significant for idiopathic pancreatitis, peripheral neuropathy with foot drop, subdural hematoma, hypertension, hyperlipidemia, CKD , OSA on CPAP, GERD.  Patient presented to the ED on 5/8 with acute pancreatitis, abdominal pain with persistent nausea and vomiting  Clinical Impression  The patient  Reports significant neuropathy. Patient ambulated with cane and pushed IV pole as noted to be very unsteady.. Noted  To have decreased dynamic  Balance without support of UE's. Patient  Should progress to Dc home.. Recommend HHPT. Pt admitted with above diagnosis. Pt currently with functional limitations due to the deficits listed below (see PT Problem List). Pt will benefit from skilled PT to increase their independence and safety with mobility to allow discharge to the venue listed below.      Follow Up Recommendations Home health PT    Equipment Recommendations  None recommended by PT    Recommendations for Other Services OT consult     Precautions / Restrictions Precautions Precautions: Fall Precaution Comments: severe peripheral neuropathy      Mobility  Bed Mobility Overal bed mobility: Independent                  Transfers Overall transfer level: Needs assistance Equipment used:  (hurri-cane) Transfers: Sit to/from Stand Sit to Stand: Min guard         General transfer comment: cues for safety  Ambulation/Gait Ambulation/Gait assistance: Min assist Gait Distance (Feet): 110 Feet Assistive device: IV Pole;Straight cane Gait Pattern/deviations: Step-through pattern;Staggering right;Staggering left;Wide base of support Gait velocity: decr   General Gait Details: gait  unsteady initially, very wide base. Held IV pole and his cane, close min guard to min  assist to steady initially  Science writer    Modified Rankin (Stroke Patients Only)       Balance Overall balance assessment: Needs assistance Sitting-balance support: Feet supported;No upper extremity supported Sitting balance-Leahy Scale: Good     Standing balance support: During functional activity;Single extremity supported Standing balance-Leahy Scale: Poor Standing balance comment: static with cane is fair, dynamic is impaired/poor without UE support                             Pertinent Vitals/Pain Pain Assessment: No/denies pain    Home Living Family/patient expects to be discharged to:: Private residence Living Arrangements: Spouse/significant other Available Help at Discharge: Personal care attendant Type of Home: House Home Access: Stairs to enter   [employer] of Steps: 4 Home Layout: Two level;Able to live on main level with bedroom/bathroom Home Equipment: Gilford Rile - 2 wheels Additional Comments: Hurricane, Has an "aide" that assists a few hours /week    Prior Function Level of Independence: Independent with assistive device(s)         Comments: does not drive, assists wife with some ADL's, wife uses RW     Hand Dominance        Extremity/Trunk Assessment   Upper Extremity Assessment Upper Extremity Assessment:  (noted decreased fine motor,  drops  things when  holding)    Lower Extremity Assessment Lower Extremity Assessment: RLE deficits/detail;LLE deficits/detail RLE Deficits / Details: same as left RLE Sensation: history of peripheral neuropathy LLE Deficits / Details: from knees down"I don't  feel anything" LLE Sensation: history of peripheral neuropathy    Cervical / Trunk Assessment Cervical / Trunk Assessment: Normal  Communication      Cognition Arousal/Alertness: Awake/alert Behavior During Therapy: WFL for tasks assessed/performed;Impulsive Overall Cognitive Status: Within  Functional Limits for tasks assessed                                        General Comments      Exercises     Assessment/Plan    PT Assessment Patient needs continued PT services  PT Problem List Decreased strength;Decreased mobility;Impaired sensation;Decreased balance;Decreased knowledge of use of DME;Decreased activity tolerance;Decreased knowledge of precautions;Decreased safety awareness       PT Treatment Interventions DME instruction;Therapeutic activities;Gait training;Therapeutic exercise;Patient/family education;Functional mobility training;Balance training    PT Goals (Current goals can be found in the Care Plan section)  Acute Rehab PT Goals Patient Stated Goal: go home PT Goal Formulation: With patient/family Time For Goal Achievement: 08/10/20 Potential to Achieve Goals: Good    Frequency Min 3X/week   Barriers to discharge        Co-evaluation               AM-PAC PT "6 Clicks" Mobility  Outcome Measure Help needed turning from your back to your side while in a flat bed without using bedrails?: None Help needed moving from lying on your back to sitting on the side of a flat bed without using bedrails?: None Help needed moving to and from a bed to a chair (including a wheelchair)?: A Little Help needed standing up from a chair using your arms (e.g., wheelchair or bedside chair)?: A Little Help needed to walk in hospital room?: A Little Help needed climbing 3-5 steps with a railing? : A Lot 6 Click Score: 19    End of Session Equipment Utilized During Treatment: Gait belt Activity Tolerance: Patient tolerated treatment well Patient left: in bed;with call bell/phone within reach;with family/visitor present;with bed alarm set Nurse Communication: Mobility status PT Visit Diagnosis: Unsteadiness on feet (R26.81);Difficulty in walking, not elsewhere classified (R26.2);Other symptoms and signs involving the nervous system (R29.898)     Time: 9924-2683 PT Time Calculation (min) (ACUTE ONLY): 16 min   Charges:   PT Evaluation $PT Eval Low Complexity: 1 Low         Claretha Cooper 07/27/2020, 4:11 PM  [place] Pager [phone] Office [phone]

## 2020-07-27 NOTE — Progress Notes (Addendum)
PROGRESS NOTE  Timothy Khan  DOB: 03-29-1931  PCP: [name], MD KZL:[id number]  DOA: 07/25/2020  LOS: 1 day  Hospital Day: 3   Chief Complaint  Patient presents with  . Abdominal Pain  . Emesis   Brief narrative: Timothy Khan is a 87 y.o. male with PMH significant for idiopathic pancreatitis, peripheral neuropathy with foot drop, subdural hematoma, hypertension, hyperlipidemia, CKD stage IIIa, OSA on CPAP, GERD. Patient presented to the ED on 5/8 with acute onset epigastric and later diffuse abdominal pain with persistent nausea and vomiting up to 8 episodes.   He does not drink alcohol. He had idiopathic pancreatitis in 2020   In the ED, patient was afebrile, hypertensive with systolic blood pressure up to 190s. Labs with unremarkable CBC, BMP with creatinine elevated to 1.41, lipase level elevated to more than 5000, mildly elevated AST and ALT with normal alk phos and total bilirubin LDH elevated to 385, triglyceride at 453 CT abdomen showed showed inflammatory changes of the head and uncinate process of the pancreas. Gallbladder was unremarkable.  No intrahepatic biliary dilatation was seen.  Subjective: Patient was seen and examined this morning. Lying on bed.  Not in distress.  Feels better.  Family at bedside. Patient had 2 episodes of fever up to one 1.5 last night.  WBC count better this morning without antibiotics Dr. Michail Sermon from GI was at bedside at the time of my evaluation.  Assessment/Plan: Acute pancreatitis History of idiopathic pancreatitis -No history of alcohol use.  No evidence of gallstone. Triglyceride only borderline at 453. Gallbladder unremarkable with no biliary dilatation. -Currently seems to be improving with conservative management with IV fluid, bowel rest, pain medicine, antiemetics.Lipase level is significantly better. -Advance from clear liquid to full liquid diet today.  GI consult appreciated. Recent Labs  Lab  07/25/20 1945 07/26/20 0526 07/27/20 0553  LIPASE 5,107* 519* 156*   SIRS -Patient presented with normal WBC count but it acutely surged to 25,000 the next day.  In last 24 hours, patient had 2 episode of fever 101.5.  Not on antibiotics.  WBC count is spontaneously improving, down to 17,000 today. -Currently not on antibiotics.  Rise in temperature and WBC count is probably due to pancreatitis itself.  No other source of infection at this time -Continue to monitor.  Obtain a chest x-ray tomorrow.  AKI on CKD stage 3a -Creatinine improving with hydration. Recent Labs    07/25/20 1945 07/26/20 0526 07/27/20 0553  BUN _0 CREATININE 1.41* 1.21 1.15   Hypophosphatemia -Phosphorus level low at 2 today.  Oral replacement ordered. Recent Labs  Lab 07/25/20 1945 07/26/20 0526 07/27/20 0553  K 4.4 4.8 4.5  MG  --   --  1.8  PHOS  --   --  2.0*   Essential hypertension -continue amlodipine.  Losartan remains on hold. -PRN IV hydralazine for greater than systolic greater than 390  Peripheral neuropathy with foot drop -continue gapapentin  OSA -continue CPAP  BPH -Continue Flomax 0.4 mg daily at bedtime  Mobility: Encourage ambulation Code Status:   Code Status: Full Code  Nutritional status: Body mass index is 25.83 kg/m.     Diet Order            Diet full liquid Room service appropriate? Yes; Fluid consistency: Thin  Diet effective now                 DVT prophylaxis: SCDs Start: 07/25/20 2210   Antimicrobials:  None Fluid: Normal saline to reduce at 75 mill per hour Consultants: GI Family Communication:  Family at bedside  Status is: Inpatient  Remains inpatient appropriate because: Continues to need IV fluid  Dispo: The patient is from: Home              Anticipated d/c is to: Home in 1 to 2 days              Patient currently is not medically stable to d/c.   Difficult to place patient No     Infusions:  . sodium chloride 75 mL/hr at  07/27/20 0956  . promethazine (PHENERGAN) injection (IM or IVPB) 12.5 mg (07/25/20 2222)    Scheduled Meds: . amLODipine  5 mg Oral Daily  . cholecalciferol  2,000 Units Oral QODAY  . fluticasone  2 spray Each Nare QHS  . gabapentin  600 mg Oral QHS  . loratadine  10 mg Oral Daily  . pantoprazole  40 mg Oral Daily  . phosphorus  500 mg Oral BID  . saccharomyces boulardii  250 mg Oral BID  . tamsulosin  0.4 mg Oral QHS  . traMADol  100 mg Oral QHS  . vitamin B-12  1,000 mcg Oral Daily    Antimicrobials: Anti-infectives (From admission, onward)   None      PRN meds: acetaminophen, albuterol, hydrALAZINE, morphine injection, promethazine (PHENERGAN) injection (IM or IVPB)   Objective: Vitals:   07/27/20 0616 07/27/20 1036  BP: 128/64 118/70  Pulse: 87 100  Resp: 17 18  Temp: 98.6 F (37 C) 98.9 F (37.2 C)  SpO2: 92% 91%    Intake/Output Summary (Last 24 hours) at 07/27/2020 1236 Last data filed at 07/27/2020 1000 Gross per 24 hour  Intake 1869.67 ml  Output --  Net 1869.67 ml   Filed Weights   07/25/20 1914  Weight: 81.6 kg   Weight change:  Body mass index is 25.83 kg/m.   Physical Exam: General exam: Pleasant, elderly Caucasian male.  Lying on bed.  Not in distress Skin: No rashes, lesions or ulcers. HEENT: Atraumatic, normocephalic, no obvious bleeding Lungs: Clear to auscultation bilaterally CVS: Regular rate and rhythm, no murmur GI/Abd soft, improving epigastric tenderness present, bowel sound present CNS: Alert, awake, oriented x3 Psychiatry: Mood appropriate Extremities: No pedal edema, no calf tenderness  Data Review: I have personally reviewed the laboratory data and studies available.  Recent Labs  Lab 07/25/20 1945 07/26/20 0526 07/27/20 0553  WBC 10.2 25.5* 17.7*  NEUTROABS 7.6  --  14.3*  HGB 15.6 16.1 13.5  HCT 47.7 49.3 41.5  MCV 88.3 89.3 90.4  PLT 238 246 181   Recent Labs  Lab 07/25/20 1945 07/26/20 0526 07/27/20 0553   NA 136 140 136  K 4.4 4.8 4.5  CL 104 104 106  CO2 _0 GLUCOSE 152* 150* 116*  BUN _1 CREATININE 1.41* 1.21 1.15  CALCIUM 9.0 8.4* 7.8*  MG  --   --  1.8  PHOS  --   --  2.0*    F/u labs ordered Unresulted Labs (From admission, onward)          Start     Ordered   07/27/20 0500  CBC with Differential/Platelet  Daily,   R     Question:  Specimen collection method  Answer:  Lab=Lab collect   07/26/20 0837   07/27/20 0500  Lipase, blood  Daily,   R     Question:  Specimen collection method  Answer:  Lab=Lab collect   07/26/20 0837   07/27/20 0500  Comprehensive metabolic panel  Daily,   R     Question:  Specimen collection method  Answer:  Lab=Lab collect   07/26/20 5733          Signed, Terrilee Croak, MD [facility] 07/27/2020

## 2020-07-28 ENCOUNTER — Encounter (HOSPITAL_COMMUNITY): Payer: Self-pay | Admitting: Family Medicine

## 2020-07-28 LAB — CBC WITH DIFFERENTIAL/PLATELET
Abs Immature Granulocytes: 0.1 10*3/uL — ABNORMAL HIGH (ref 0.00–0.07)
Basophils Absolute: 0.1 10*3/uL (ref 0.0–0.1)
Basophils Relative: 0 %
Eosinophils Absolute: 0.2 10*3/uL (ref 0.0–0.5)
Eosinophils Relative: 1 %
HCT: 37.7 % — ABNORMAL LOW (ref 39.0–52.0)
Hemoglobin: 12.1 g/dL — ABNORMAL LOW (ref 13.0–17.0)
Immature Granulocytes: 1 %
Lymphocytes Relative: 13 %
Lymphs Abs: 1.7 10*3/uL (ref 0.7–4.0)
MCH: 29.3 pg (ref 26.0–34.0)
MCHC: 32.1 g/dL (ref 30.0–36.0)
MCV: 91.3 fL (ref 80.0–100.0)
Monocytes Absolute: 0.9 10*3/uL (ref 0.1–1.0)
Monocytes Relative: 6 %
Neutro Abs: 10.9 10*3/uL — ABNORMAL HIGH (ref 1.7–7.7)
Neutrophils Relative %: 79 %
Platelets: 176 10*3/uL (ref 150–400)
RBC: 4.13 MIL/uL — ABNORMAL LOW (ref 4.22–5.81)
RDW: 12.9 % (ref 11.5–15.5)
WBC: 13.8 10*3/uL — ABNORMAL HIGH (ref 4.0–10.5)
nRBC: 0 % (ref 0.0–0.2)

## 2020-07-28 LAB — COMPREHENSIVE METABOLIC PANEL
ALT: 53 U/L — ABNORMAL HIGH (ref 0–44)
AST: 32 U/L (ref 15–41)
Albumin: 3.1 g/dL — ABNORMAL LOW (ref 3.5–5.0)
Alkaline Phosphatase: 46 U/L (ref 38–126)
Anion gap: 5 (ref 5–15)
BUN: 11 mg/dL (ref 8–23)
CO2: 23 mmol/L (ref 22–32)
Calcium: 7.8 mg/dL — ABNORMAL LOW (ref 8.9–10.3)
Chloride: 108 mmol/L (ref 98–111)
Creatinine, Ser: 1.07 mg/dL (ref 0.61–1.24)
GFR, Estimated: >60 mL/min (ref >60)
Glucose, Bld: 107 mg/dL — ABNORMAL HIGH (ref 70–99)
Potassium: 3.8 mmol/L (ref 3.5–5.1)
Sodium: 136 mmol/L (ref 135–145)
Total Bilirubin: 1.1 mg/dL (ref 0.3–1.2)
Total Protein: 5.9 g/dL — ABNORMAL LOW (ref 6.5–8.1)

## 2020-07-28 LAB — LIPASE, BLOOD: Lipase: 49 U/L (ref 11–51)

## 2020-07-28 MED ORDER — SENNOSIDES-DOCUSATE SODIUM 8.6-50 MG PO TABS
1.0000 | ORAL_TABLET | Freq: Two times a day (BID) | ORAL | Status: DC
  Administered 2020-07-28: 1 via ORAL
  Filled 2020-07-28: qty 1

## 2020-07-28 NOTE — Progress Notes (Signed)
Pt discharged home with all belongings. Discharge education completed. No questions or concerns at this time. Encouraged pt to follow up with PCP if any concerns arise.

## 2020-07-28 NOTE — Progress Notes (Signed)
[facility] Gastroenterology Progress Note  Timothy Khan 87 y.o. [birth year]  CC:  Acute pancreatitis  Subjective: Patient reports feeling well. He is tolerating a regular diet and denies abdominal pain, nausea, vomiting.  States he had a normal, formed BM today.  Objective: Vital signs: Vitals:   07/27/20 2114 07/28/20 0452  BP: 119/68 133/76  Pulse: (!) 103 92  Resp: 18 17  Temp: 99.6 F (37.6 C) 98 F (36.7 C)  SpO2: 93% 91%    Physical Exam: Gen: elderly alert, oriented, well-nourished, no acute distress  HEENT: anicteric sclera CV: RRR Chest: CTA B Abd: soft, non-tender, nondistended, +BS Ext: no edema  Lab Results: Recent Labs    07/27/20 0553 07/28/20 0527  NA 136 136  K 4.5 3.8  CL 106 108  CO2 27 23  GLUCOSE 116* 107*  BUN 14 11  CREATININE 1.15 1.07  CALCIUM 7.8* 7.8*  MG 1.8  --   PHOS 2.0*  --    Recent Labs    07/27/20 0553 07/28/20 0527  AST 38 32  ALT 64* 53*  ALKPHOS 46 46  BILITOT 1.1 1.1  PROT 5.7* 5.9*  ALBUMIN 3.1* 3.1*   Recent Labs    07/27/20 0553 07/28/20 0527  WBC 17.7* 13.8*  NEUTROABS 14.3* 10.9*  HGB 13.5 12.1*  HCT 41.5 37.7*  MCV 90.4 91.3  PLT 181 176    Assessment: Acute pancreatitis: likely medication-related vs triglycerides -WBCs continue to decrease, now 13.8 -Lipase now WNL -Normal renal function: BUN 11/ Cr 1.07 -Mildly elevated ALT (53), otherwise normal LFTs  Plan: OK to discharge from a GI standpoint.  Advised patient to follow low fat diet.  Follow-up with [facility] in 1-2 months, plan for repeat CT in ~1 month.  Advised patient to schedule FU with PCP to discuss methods/medications to decrease triglycerides and to discuss possible alternatives to losartan.  [facility] will sign off.  Please contact us if we can be of any further assistance during this hospital stay.  Salley Debnam 07/28/2020, 10:52 AM  Questions please call [phone] ID: [name], male   DOB:  [birth year], 87 y.o.   MRN: [id number]

## 2020-07-28 NOTE — Evaluation (Signed)
Occupational Therapy Evaluation Patient Details Name: Timothy Khan MRN: [id number] DOB: [birth year] Today's Date: 07/28/2020    History of Present Illness Timothy Khan is a 87 y.o. male with PMH significant for idiopathic pancreatitis, peripheral neuropathy with foot drop, subdural hematoma, hypertension, hyperlipidemia, CKD , OSA on CPAP, GERD.  Patient presented to the ED on 5/8 with acute pancreatitis, abdominal pain with persistent nausea and vomiting   Clinical Impression   [name] is an 87 year old man who demonstrates ability to perform ADLs and functional mobility with RW with physical assistance. He reports feeling weaker than normal and needing the Rw compared to usually using the cane. From a functional standpoint patient is at his baseline and does not need OT services. Recommended patient use RW until he feels he is back to his baseline ambulatory wise. Recommended walking in the hall with family and nursing as well.     Follow Up Recommendations  No OT follow up    Equipment Recommendations  None recommended by OT    Recommendations for Other Services       Precautions / Restrictions Precautions Precautions: Fall Precaution Comments: severe peripheral neuropathy Restrictions Weight Bearing Restrictions: No      Mobility Bed Mobility Overal bed mobility: Needs Assistance             General bed mobility comments: sitting at side of bed    Transfers Overall transfer level: Needs assistance Equipment used: Rolling walker (2 wheeled) Transfers: Sit to/from Stand Sit to Stand: Min guard         General transfer comment: Min guard to ambulate in room and hall ( > 300 feet) with RW. Patient typically uses cane but reports he will probably need RW initially.    Balance   Sitting-balance support: Feet supported Sitting balance-Leahy Scale: Good     Standing balance support: During functional activity;Bilateral upper  extremity supported Standing balance-Leahy Scale: Fair Standing balance comment: reliant on walker for ambulation but able to take hands off for clothing management.                           ADL either performed or assessed with clinical judgement   ADL Overall ADL's : At baseline                                       General ADL Comments: Demonstrates ability to don socks, don pants, and physical abilities to perform all aspects of ADLs. Reports being up to [facility] every hour.     Vision Patient Visual Report: No change from baseline       Perception     Praxis      Pertinent Vitals/Pain Pain Assessment: No/denies pain     Hand Dominance Right   Extremity/Trunk Assessment Upper Extremity Assessment Upper Extremity Assessment: Overall WFL for tasks assessed   Lower Extremity Assessment Lower Extremity Assessment: Defer to PT evaluation       Communication Communication Communication: No difficulties   Cognition Arousal/Alertness: Awake/alert   Overall Cognitive Status: Within Functional Limits for tasks assessed                                     General Comments       Exercises  Shoulder Instructions      Home Living Family/patient expects to be discharged to:: Private residence Living Arrangements: Spouse/significant other Available Help at Discharge: Personal care attendant Type of Home: House Home Access: Stairs to enter [employer] of Steps: 4   Home Layout: Two level;Able to live on main level with bedroom/bathroom Alternate Level Stairs-Number of Steps: car is down the basement steps   Bathroom Shower/Tub: Teacher, early years/pre: Standard     Home Equipment: Environmental consultant - 2 wheels;Bedside commode;Shower seat;Walker - 4 wheels   Additional Comments: [place], Has an "aide" that assists a few hours everyday x 5 days a week      Prior Functioning/Environment Level of  Independence: Independent with assistive device(s)        Comments: does not drive, assists wife with some ADL's, wife uses Rollator        OT Problem List:        OT Treatment/Interventions:      OT Goals(Current goals can be found in the care plan section) Acute Rehab OT Goals Patient Stated Goal: go home OT Goal Formulation: All assessment and education complete, DC therapy  OT Frequency:     Barriers to D/C:            Co-evaluation              AM-PAC OT "6 Clicks" Daily Activity     Outcome Measure Help from another person eating meals?: None Help from another person taking care of personal grooming?: None Help from another person toileting, which includes using toliet, bedpan, or urinal?: None Help from another person bathing (including washing, rinsing, drying)?: None Help from another person to put on and taking off regular upper body clothing?: None Help from another person to put on and taking off regular lower body clothing?: None 6 Click Score: 24   End of Session Equipment Utilized During Treatment: Rolling walker Nurse Communication: Mobility status  Activity Tolerance: Patient tolerated treatment well Patient left: in chair;with call bell/phone within reach;with family/visitor present  OT Visit Diagnosis: Muscle weakness (generalized) (M62.81);Unsteadiness on feet (R26.81)                Time: 8469-6295 OT Time Calculation (min): 19 min Charges:  OT General Charges $OT Visit: 1 Visit OT Evaluation $OT Eval Low Complexity: 1 Low  Cambreigh Dearing, OTR/L [place]  Office [phone] Pager: [place] 07/28/2020, 10:36 AM

## 2020-07-28 NOTE — Discharge Summary (Signed)
Physician Discharge Summary  Timothy Khan JYN:[id number] DOB: Sep 05, 1931 DOA: 07/25/2020  PCP: [name], MD  Admit date: 07/25/2020 Discharge date: 07/28/2020  Admitted From: Home Discharge disposition: Home   Code Status: Full Code  Diet Recommendation: Heart healthy diet  Discharge Diagnosis:   Active Problems:   Hypertension   Foot drop, bilateral   Acute pancreatitis   OSA (obstructive sleep apnea)   Acute-on-chronic kidney injury [facility])  Cardiac diet   Chief Complaint  Patient presents with  . Abdominal Pain  . Emesis   Brief narrative: Timothy Khan is a 87 y.o. male with PMH significant for idiopathic pancreatitis, peripheral neuropathy with foot drop, subdural hematoma, hypertension, hyperlipidemia, CKD stage IIIa, OSA on CPAP, GERD. Patient presented to the ED on 5/8 with acute onset epigastric and later diffuse abdominal pain with persistent nausea and vomiting up to 8 episodes.   He does not drink alcohol. He had idiopathic pancreatitis in 2020   In the ED, patient was afebrile, hypertensive with systolic blood pressure up to 190s. Labs with unremarkable CBC, BMP with creatinine elevated to 1.41, lipase level elevated to more than 5000, mildly elevated AST and ALT with normal alk phos and total bilirubin LDH elevated to 385, triglyceride at 453 CT abdomen showed showed inflammatory changes of the head and uncinate process of the pancreas. Gallbladder was unremarkable.  No intrahepatic biliary dilatation was seen.  Subjective: Patient was seen and examined this morning. Sitting up in chair.  Not in distress.  No new symptoms.  No fever.  WBC count improving.  Hospital course: Acute pancreatitis History of idiopathic pancreatitis -No history of alcohol use.  No evidence of gallstone. Triglyceride only borderline at 453. Gallbladder unremarkable with no biliary dilatation. -Currently seems to be improving with conservative management with IV  fluid, bowel rest, pain medicine, antiemetics. Lipase level is significantly better. -GI consult appreciated.  Diet advanced. -Okay to discharge home today. Recent Labs  Lab 07/25/20 1945 07/26/20 0526 07/27/20 0553 07/28/20 0527  LIPASE 5,107* 519* 156* 49   SIRS -No fever normal WBC count at presentation but while in the hospital, patient spiked fever and had a WBC count up to 25,000.  This was probably due to acute pancreatitis.  No antibiotics was required.  Fever subsided.  WBC count improved.   Recent Labs  Lab 07/25/20 1945 07/26/20 0526 07/27/20 0553 07/28/20 0527  WBC 10.2 25.5* 17.7* 13.8*   AKI on CKD stage 3a -Creatinine improving with hydration. Recent Labs    07/25/20 1945 07/26/20 0526 07/27/20 0553 07/28/20 0527  BUN 22 17 14 11   CREATININE 1.41* 1.21 1.15 1.07   Hypophosphatemia -Phosphorus level was low at 2 yesterday.  Replacement given. Recent Labs  Lab 07/25/20 1945 07/26/20 0526 07/27/20 0553 07/28/20 0527  K 4.4 4.8 4.5 3.8  MG  --   --  1.8  --   PHOS  --   --  2.0*  --    Essential hypertension -continue amlodipine.  Losartan remains on hold.  Peripheral neuropathy with foot drop -continue gapapentin  OSA -continue CPAP  BPH -Continue Flomax 0.4 mg daily at bedtime  Stable for discharge to home today.   Wound care:    Discharge Exam:   Vitals:   07/27/20 1036 07/27/20 1414 07/27/20 2114 07/28/20 0452  BP: 118/70 (!) 121/54 119/68 133/76  Pulse: 100 (!) 101 (!) 103 92  Resp: 18 17 18 17   Temp: 98.9 F (37.2 C) 98.2 F (36.8 C)  99.6 F (37.6 C) 98 F (36.7 C)  TempSrc: Oral Oral Oral Oral  SpO2: 91% 92% 93% 91%  Weight:      Height:        Body mass index is 25.83 kg/m.  General exam: Pleasant, elderly Caucasian male.  Not in distress Skin: No rashes, lesions or ulcers. HEENT: Atraumatic, normocephalic, no obvious bleeding Lungs: Clear to auscultation bilaterally CVS: Regular rate and rhythm, no  murmur GI/Abd soft, nontender, nondistended, bowel sound present CNS: Alert, awake, oriented x3 Psychiatry: Mood appropriate Extremities: Trace bilateral pedal edema  Follow ups:   Discharge Instructions    Diet - low sodium heart healthy   Complete by: As directed    Increase activity slowly   Complete by: As directed       Follow-up Information    [name], MD Follow up.   Specialty: Internal Medicine Contact information: [address] [place] [address] [phone]               Recommendations for Outpatient Follow-Up:   1. Follow-up with PCP as an outpatient  Discharge Instructions:  Follow with Primary MD [name], MD in 7 days   Get CBC/BMP checked in next visit within 1 week by PCP or SNF MD ( we routinely change or add medications that can affect your baseline labs and fluid status, therefore we recommend that you get the mentioned basic workup next visit with your PCP, your PCP may decide not to get them or add new tests based on their clinical decision)  On your next visit with your PCP, please Get Medicines reviewed and adjusted.  Please request your PCP  to go over all Hospital Tests and Procedure/Radiological results at the follow up, please get all Hospital records sent to your Prim MD by signing hospital release before you go home.  Activity: As tolerated with Full fall precautions use walker/cane & assistance as needed  For Heart failure patients - Check your Weight same time everyday, if you gain over 2 pounds, or you develop in leg swelling, experience more shortness of breath or chest pain, call your Primary MD immediately. Follow Cardiac Low Salt Diet and 1.5 lit/day fluid restriction.  If you have smoked or chewed Tobacco in the last 2 yrs please stop smoking, stop any regular Alcohol  and or any Recreational drug use.  If you experience worsening of your admission symptoms, develop shortness of breath, life  threatening emergency, suicidal or homicidal thoughts you must seek medical attention immediately by calling 911 or calling your MD immediately  if symptoms less severe.  You Must read complete instructions/literature along with all the possible adverse reactions/side effects for all the Medicines you take and that have been prescribed to you. Take any new Medicines after you have completely understood and accpet all the possible adverse reactions/side effects.   Do not drive, operate heavy machinery, perform activities at heights, swimming or participation in water activities or provide baby sitting services if your were admitted for syncope or siezures until you have seen by Primary MD or a Neurologist and advised to do so again.  Do not drive when taking Pain medications.  Do not take more than prescribed Pain, Sleep and Anxiety Medications  Wear Seat belts while driving.   Please note You were cared for by a hospitalist during your hospital stay. If you have any questions about your discharge medications or the care you received while you were in the hospital  after you are discharged, you can call the unit and asked to speak with the hospitalist on call if the hospitalist that took care of you is not available. Once you are discharged, your primary care physician will handle any further medical issues. Please note that NO REFILLS for any discharge medications will be authorized once you are discharged, as it is imperative that you return to your primary care physician (or establish a relationship with a primary care physician if you do not have one) for your aftercare needs so that they can reassess your need for medications and monitor your lab values.    Allergies as of 07/28/2020      Reactions   Doxazosin Mesylate    Other reaction(s): fatigue   Lisinopril    Other reaction(s): cough   Macrolides And Ketolides    Other reaction(s): rash   Codeine Nausea Only      Medication List     TAKE these medications   albuterol 108 (90 Base) MCG/ACT inhaler Commonly known as: VENTOLIN HFA Inhale 2 puffs into the lungs every 6 (six) hours as needed for wheezing or shortness of breath.   amLODipine 5 MG tablet Commonly known as: NORVASC Take 5 mg by mouth daily.   cetirizine 10 MG tablet Commonly known as: ZYRTEC Take 10 mg by mouth at bedtime.   Co Q 10 100 MG Caps Take 100 mg by mouth daily.   esomeprazole 20 MG capsule Commonly known as: NEXIUM Take 20 mg by mouth daily.   fluocinonide 0.05 % external solution Commonly known as: LIDEX Apply 1 application topically daily. 20 drops or 1 ml on the scalp   fluticasone 50 MCG/ACT nasal spray Commonly known as: FLONASE Place 2 sprays into both nostrils at bedtime.   gabapentin 300 MG capsule Commonly known as: NEURONTIN Take 2 capsules (600 mg total) by mouth at bedtime.   losartan 100 MG tablet Commonly known as: COZAAR Take 100 mg by mouth at bedtime.   mometasone 0.1 % cream Commonly known as: ELOCON Apply 1 application topically daily.   ondansetron 4 MG tablet Commonly known as: ZOFRAN Take 4 mg by mouth 3 (three) times daily as needed for nausea or vomiting.   polyethylene glycol 17 g packet Commonly known as: MIRALAX / GLYCOLAX Take 17 g by mouth 2 (two) times daily.   saccharomyces boulardii 250 MG capsule Commonly known as: FLORASTOR Take 250 mg by mouth 2 (two) times daily.   tamsulosin 0.4 MG Caps capsule Commonly known as: FLOMAX Take 0.4 mg by mouth at bedtime.   traMADol 50 MG tablet Commonly known as: ULTRAM Take 2 tablets (100 mg total) by mouth daily. What changed: when to take this   vitamin B-12 1000 MCG tablet Commonly known as: CYANOCOBALAMIN Take 1,000 mcg by mouth daily.   Vitamin D3 50 MCG (2000 UT) capsule Take 2,000 Units by mouth every other day.       Time coordinating discharge: 35 minutes  The results of significant diagnostics from this hospitalization  (including imaging, microbiology, ancillary and laboratory) are listed below for reference.    Procedures and Diagnostic Studies:   CT ABDOMEN PELVIS W CONTRAST  Result Date: 07/25/2020 CLINICAL DATA:  87 year old male with epigastric pain. EXAM: CT ABDOMEN AND PELVIS WITH CONTRAST TECHNIQUE: Multidetector CT imaging of the abdomen and pelvis was performed using the standard protocol following bolus administration of intravenous contrast. CONTRAST:  144m OMNIPAQUE IOHEXOL 300 MG/ML  SOLN COMPARISON:  CT abdomen pelvis dated  12/05/2019. FINDINGS: Lower chest: Bibasilar atelectasis. The visualized lung bases are otherwise clear. No intra-abdominal free air or free fluid. Hepatobiliary: Diffuse fatty liver. Multiple hepatic cysts and additional subcentimeter hypodense lesions which are too small to characterize. No intrahepatic biliary dilatation. The gallbladder is unremarkable. Pancreas: There is inflammatory changes of the pancreas predominantly involving the head and uncinate process of the pancreas consistent with acute pancreatitis. No drainable fluid collection or abscess/pseudocyst. Spleen: Normal in size without focal abnormality. Adrenals/Urinary Tract: The adrenal glands are unremarkable. There is no hydronephrosis on either side. There is symmetric enhancement and excretion of contrast by both kidneys. Subcentimeter left renal hypodense lesions are too small to characterize. The visualized ureters and urinary bladder appear unremarkable. Stomach/Bowel: There is sigmoid diverticulosis without active inflammatory changes. There is no bowel obstruction or active inflammation. The appendix is normal. Vascular/Lymphatic: Mild aortoiliac atherosclerotic disease. The IVC is unremarkable. No portal venous gas. The SMV, splenic vein, and main portal vein are patent. There is no adenopathy. Reproductive: Enlarged prostate gland with median lobe hypertrophy. Other: None Musculoskeletal: Osteopenia with  degenerative changes of the spine. No acute osseous pathology. Old L1 compression fracture with anterior wedging. IMPRESSION: 1. Acute pancreatitis. No abscess or abscess/pseudocyst. 2. Fatty liver. 3. Sigmoid diverticulosis. 4. Aortic Atherosclerosis (ICD10-I70.0). Electronically Signed   By: Anner Crete M.D.   On: 07/25/2020 20:57     Labs:   Basic Metabolic Panel: Recent Labs  Lab 07/25/20 1945 07/26/20 0526 07/27/20 0553 07/28/20 0527  NA 136 140 136 136  K 4.4 4.8 4.5 3.8  CL 104 104 106 108  CO2 23 27 27 23   GLUCOSE 152* 150* 116* 107*  BUN 22 17 14 11   CREATININE 1.41* 1.21 1.15 1.07  CALCIUM 9.0 8.4* 7.8* 7.8*  MG  --   --  1.8  --   PHOS  --   --  2.0*  --    GFR Estimated Creatinine Clearance: 51.2 mL/min (by C-G formula based on SCr of 1.07 mg/dL). Liver Function Tests: Recent Labs  Lab 07/25/20 1945 07/26/20 0526 07/27/20 0553 07/28/20 0527  AST 76* 95* 38 32  ALT 53* 112* 64* 53*  ALKPHOS 81 67 46 46  BILITOT 1.6* 0.9 1.1 1.1  PROT 8.8* 7.7 5.7* 5.9*  ALBUMIN 5.1* 4.1 3.1* 3.1*   Recent Labs  Lab 07/25/20 1945 07/26/20 0526 07/27/20 0553 07/28/20 0527  LIPASE 5,107* 519* 156* 49   No results for input(s): AMMONIA in the last 168 hours. Coagulation profile No results for input(s): INR, PROTIME in the last 168 hours.  CBC: Recent Labs  Lab 07/25/20 1945 07/26/20 0526 07/27/20 0553 07/28/20 0527  WBC 10.2 25.5* 17.7* 13.8*  NEUTROABS 7.6  --  14.3* 10.9*  HGB 15.6 16.1 13.5 12.1*  HCT 47.7 49.3 41.5 37.7*  MCV 88.3 89.3 90.4 91.3  PLT 238 246 181 176   Cardiac Enzymes: No results for input(s): CKTOTAL, CKMB, CKMBINDEX, TROPONINI in the last 168 hours. BNP: Invalid input(s): POCBNP CBG: No results for input(s): GLUCAP in the last 168 hours. D-Dimer No results for input(s): DDIMER in the last 72 hours. Hgb A1c No results for input(s): HGBA1C in the last 72 hours. Lipid Profile Recent Labs    07/25/20 1945  TRIG 453*    Thyroid function studies Recent Labs    07/26/20 0526  TSH 0.698   Anemia work up No results for input(s): VITAMINB12, FOLATE, FERRITIN, TIBC, IRON, RETICCTPCT in the last 72 hours. Microbiology Recent Results (from  the past 240 hour(s))  Resp Panel by RT-PCR (Flu A&B, Covid) Nasopharyngeal Swab     Status: None   Collection Time: 07/25/20  7:45 PM   Specimen: Nasopharyngeal Swab; Nasopharyngeal(NP) swabs in vial transport medium  Result Value Ref Range Status   SARS Coronavirus 2 by RT PCR NEGATIVE NEGATIVE Final    Comment: (NOTE) SARS-CoV-2 target nucleic acids are NOT DETECTED.  The SARS-CoV-2 RNA is generally detectable in upper respiratory specimens during the acute phase of infection. The lowest concentration of SARS-CoV-2 viral copies this assay can detect is 138 copies/mL. A negative result does not preclude SARS-Cov-2 infection and should not be used as the sole basis for treatment or other patient management decisions. A negative result may occur with  improper specimen collection/handling, submission of specimen other than nasopharyngeal swab, presence of viral mutation(s) within the areas targeted by this assay, and inadequate number of viral copies(<138 copies/mL). A negative result must be combined with clinical observations, patient history, and epidemiological information. The expected result is Negative.  Fact Sheet for Patients:  EntrepreneurPulse.com.au  Fact Sheet for Healthcare Providers:  IncredibleEmployment.be  This test is no t yet approved or cleared by the [place] FDA and  has been authorized for detection and/or diagnosis of SARS-CoV-2 by FDA under an Emergency Use Authorization (EUA). This EUA will remain  in effect (meaning this test can be used) for the duration of the COVID-19 declaration under Section 564(b)(1) of the Act, 21 U.S.C.section 360bbb-3(b)(1), unless the authorization is terminated   or revoked sooner.       Influenza A by PCR NEGATIVE NEGATIVE Final   Influenza B by PCR NEGATIVE NEGATIVE Final    Comment: (NOTE) The Xpert Xpress SARS-CoV-2/FLU/RSV plus assay is intended as an aid in the diagnosis of influenza from Nasopharyngeal swab specimens and should not be used as a sole basis for treatment. Nasal washings and aspirates are unacceptable for Xpert Xpress SARS-CoV-2/FLU/RSV testing.  Fact Sheet for Patients: EntrepreneurPulse.com.au  Fact Sheet for Healthcare Providers: IncredibleEmployment.be  This test is not yet approved or cleared by the [place] FDA and has been authorized for detection and/or diagnosis of SARS-CoV-2 by FDA under an Emergency Use Authorization (EUA). This EUA will remain in effect (meaning this test can be used) for the duration of the COVID-19 declaration under Section 564(b)(1) of the Act, 21 U.S.C. section 360bbb-3(b)(1), unless the authorization is terminated or revoked.  Performed at [facility], [place] [address], [place], [place] [address]      Signed: Marlowe Aschoff Maximo Spratling  [facility] 07/28/2020, 11:54 AM

## 2020-08-04 DIAGNOSIS — I10 Essential (primary) hypertension: Secondary | ICD-10-CM | POA: Diagnosis not present

## 2020-08-04 DIAGNOSIS — I129 Hypertensive chronic kidney disease with stage 1 through stage 4 chronic kidney disease, or unspecified chronic kidney disease: Secondary | ICD-10-CM | POA: Diagnosis not present

## 2020-08-04 DIAGNOSIS — G8929 Other chronic pain: Secondary | ICD-10-CM | POA: Diagnosis not present

## 2020-08-04 DIAGNOSIS — K219 Gastro-esophageal reflux disease without esophagitis: Secondary | ICD-10-CM | POA: Diagnosis not present

## 2020-08-04 DIAGNOSIS — E782 Mixed hyperlipidemia: Secondary | ICD-10-CM | POA: Diagnosis not present

## 2020-08-04 DIAGNOSIS — N1831 Chronic kidney disease, stage 3a: Secondary | ICD-10-CM | POA: Diagnosis not present

## 2020-08-04 DIAGNOSIS — E781 Pure hyperglyceridemia: Secondary | ICD-10-CM | POA: Diagnosis not present

## 2020-08-04 DIAGNOSIS — G4733 Obstructive sleep apnea (adult) (pediatric): Secondary | ICD-10-CM | POA: Diagnosis not present

## 2020-08-05 DIAGNOSIS — K8500 Idiopathic acute pancreatitis without necrosis or infection: Secondary | ICD-10-CM | POA: Diagnosis not present

## 2020-08-05 DIAGNOSIS — E781 Pure hyperglyceridemia: Secondary | ICD-10-CM | POA: Diagnosis not present

## 2020-08-06 DIAGNOSIS — E781 Pure hyperglyceridemia: Secondary | ICD-10-CM | POA: Diagnosis not present

## 2020-08-06 DIAGNOSIS — K8500 Idiopathic acute pancreatitis without necrosis or infection: Secondary | ICD-10-CM | POA: Diagnosis not present

## 2020-09-04 DIAGNOSIS — G4733 Obstructive sleep apnea (adult) (pediatric): Secondary | ICD-10-CM | POA: Diagnosis not present

## 2020-09-07 DIAGNOSIS — I10 Essential (primary) hypertension: Secondary | ICD-10-CM | POA: Diagnosis not present

## 2020-09-07 DIAGNOSIS — E782 Mixed hyperlipidemia: Secondary | ICD-10-CM | POA: Diagnosis not present

## 2020-09-07 DIAGNOSIS — K219 Gastro-esophageal reflux disease without esophagitis: Secondary | ICD-10-CM | POA: Diagnosis not present

## 2020-09-07 DIAGNOSIS — G8929 Other chronic pain: Secondary | ICD-10-CM | POA: Diagnosis not present

## 2020-09-07 DIAGNOSIS — E781 Pure hyperglyceridemia: Secondary | ICD-10-CM | POA: Diagnosis not present

## 2020-09-07 DIAGNOSIS — I129 Hypertensive chronic kidney disease with stage 1 through stage 4 chronic kidney disease, or unspecified chronic kidney disease: Secondary | ICD-10-CM | POA: Diagnosis not present

## 2020-09-13 DIAGNOSIS — K859 Acute pancreatitis without necrosis or infection, unspecified: Secondary | ICD-10-CM | POA: Diagnosis not present

## 2020-09-13 DIAGNOSIS — R197 Diarrhea, unspecified: Secondary | ICD-10-CM | POA: Diagnosis not present

## 2020-09-13 DIAGNOSIS — R112 Nausea with vomiting, unspecified: Secondary | ICD-10-CM | POA: Diagnosis not present

## 2020-10-01 DIAGNOSIS — E781 Pure hyperglyceridemia: Secondary | ICD-10-CM | POA: Diagnosis not present

## 2020-10-01 DIAGNOSIS — G8929 Other chronic pain: Secondary | ICD-10-CM | POA: Diagnosis not present

## 2020-10-01 DIAGNOSIS — I129 Hypertensive chronic kidney disease with stage 1 through stage 4 chronic kidney disease, or unspecified chronic kidney disease: Secondary | ICD-10-CM | POA: Diagnosis not present

## 2020-10-01 DIAGNOSIS — E782 Mixed hyperlipidemia: Secondary | ICD-10-CM | POA: Diagnosis not present

## 2020-10-01 DIAGNOSIS — I10 Essential (primary) hypertension: Secondary | ICD-10-CM | POA: Diagnosis not present

## 2020-10-01 DIAGNOSIS — K219 Gastro-esophageal reflux disease without esophagitis: Secondary | ICD-10-CM | POA: Diagnosis not present

## 2020-10-01 DIAGNOSIS — N1831 Chronic kidney disease, stage 3a: Secondary | ICD-10-CM | POA: Diagnosis not present

## 2020-10-04 DIAGNOSIS — G4733 Obstructive sleep apnea (adult) (pediatric): Secondary | ICD-10-CM | POA: Diagnosis not present

## 2020-11-04 DIAGNOSIS — G4733 Obstructive sleep apnea (adult) (pediatric): Secondary | ICD-10-CM | POA: Diagnosis not present

## 2020-11-11 DIAGNOSIS — K219 Gastro-esophageal reflux disease without esophagitis: Secondary | ICD-10-CM | POA: Diagnosis not present

## 2020-11-11 DIAGNOSIS — I129 Hypertensive chronic kidney disease with stage 1 through stage 4 chronic kidney disease, or unspecified chronic kidney disease: Secondary | ICD-10-CM | POA: Diagnosis not present

## 2020-11-11 DIAGNOSIS — G8929 Other chronic pain: Secondary | ICD-10-CM | POA: Diagnosis not present

## 2020-11-11 DIAGNOSIS — N1831 Chronic kidney disease, stage 3a: Secondary | ICD-10-CM | POA: Diagnosis not present

## 2020-11-11 DIAGNOSIS — E781 Pure hyperglyceridemia: Secondary | ICD-10-CM | POA: Diagnosis not present

## 2020-11-11 DIAGNOSIS — I10 Essential (primary) hypertension: Secondary | ICD-10-CM | POA: Diagnosis not present

## 2020-11-11 DIAGNOSIS — E782 Mixed hyperlipidemia: Secondary | ICD-10-CM | POA: Diagnosis not present

## 2020-12-01 DIAGNOSIS — E781 Pure hyperglyceridemia: Secondary | ICD-10-CM | POA: Diagnosis not present

## 2020-12-01 DIAGNOSIS — I10 Essential (primary) hypertension: Secondary | ICD-10-CM | POA: Diagnosis not present

## 2020-12-01 DIAGNOSIS — I129 Hypertensive chronic kidney disease with stage 1 through stage 4 chronic kidney disease, or unspecified chronic kidney disease: Secondary | ICD-10-CM | POA: Diagnosis not present

## 2020-12-01 DIAGNOSIS — K219 Gastro-esophageal reflux disease without esophagitis: Secondary | ICD-10-CM | POA: Diagnosis not present

## 2020-12-01 DIAGNOSIS — N1831 Chronic kidney disease, stage 3a: Secondary | ICD-10-CM | POA: Diagnosis not present

## 2020-12-01 DIAGNOSIS — E782 Mixed hyperlipidemia: Secondary | ICD-10-CM | POA: Diagnosis not present

## 2020-12-01 DIAGNOSIS — G8929 Other chronic pain: Secondary | ICD-10-CM | POA: Diagnosis not present

## 2020-12-05 DIAGNOSIS — G4733 Obstructive sleep apnea (adult) (pediatric): Secondary | ICD-10-CM | POA: Diagnosis not present

## 2020-12-14 DIAGNOSIS — Z7689 Persons encountering health services in other specified circumstances: Secondary | ICD-10-CM | POA: Diagnosis not present

## 2020-12-14 DIAGNOSIS — Z8719 Personal history of other diseases of the digestive system: Secondary | ICD-10-CM | POA: Diagnosis not present

## 2020-12-28 DIAGNOSIS — N183 Chronic kidney disease, stage 3 unspecified: Secondary | ICD-10-CM | POA: Diagnosis not present

## 2020-12-30 DIAGNOSIS — E782 Mixed hyperlipidemia: Secondary | ICD-10-CM | POA: Diagnosis not present

## 2020-12-30 DIAGNOSIS — I10 Essential (primary) hypertension: Secondary | ICD-10-CM | POA: Diagnosis not present

## 2020-12-30 DIAGNOSIS — G8929 Other chronic pain: Secondary | ICD-10-CM | POA: Diagnosis not present

## 2020-12-30 DIAGNOSIS — E781 Pure hyperglyceridemia: Secondary | ICD-10-CM | POA: Diagnosis not present

## 2020-12-30 DIAGNOSIS — I129 Hypertensive chronic kidney disease with stage 1 through stage 4 chronic kidney disease, or unspecified chronic kidney disease: Secondary | ICD-10-CM | POA: Diagnosis not present

## 2020-12-30 DIAGNOSIS — N1831 Chronic kidney disease, stage 3a: Secondary | ICD-10-CM | POA: Diagnosis not present

## 2020-12-30 DIAGNOSIS — K219 Gastro-esophageal reflux disease without esophagitis: Secondary | ICD-10-CM | POA: Diagnosis not present

## 2021-01-04 DIAGNOSIS — G4733 Obstructive sleep apnea (adult) (pediatric): Secondary | ICD-10-CM | POA: Diagnosis not present

## 2021-01-10 DIAGNOSIS — K219 Gastro-esophageal reflux disease without esophagitis: Secondary | ICD-10-CM | POA: Diagnosis not present

## 2021-01-10 DIAGNOSIS — I10 Essential (primary) hypertension: Secondary | ICD-10-CM | POA: Diagnosis not present

## 2021-01-11 DIAGNOSIS — I10 Essential (primary) hypertension: Secondary | ICD-10-CM | POA: Diagnosis not present

## 2021-01-25 ENCOUNTER — Other Ambulatory Visit: Payer: Self-pay

## 2021-01-25 ENCOUNTER — Encounter (HOSPITAL_COMMUNITY): Payer: Self-pay

## 2021-01-25 ENCOUNTER — Emergency Department (HOSPITAL_COMMUNITY): Payer: Medicare Other

## 2021-01-25 ENCOUNTER — Inpatient Hospital Stay (HOSPITAL_COMMUNITY)
Admission: EM | Admit: 2021-01-25 | Discharge: 2021-01-30 | DRG: 854 | Disposition: A | Discharge status: Home Health | Payer: Medicare Other | Attending: Internal Medicine | Admitting: Internal Medicine

## 2021-01-25 DIAGNOSIS — R6510 Systemic inflammatory response syndrome (SIRS) of non-infectious origin without acute organ dysfunction: Secondary | ICD-10-CM | POA: Diagnosis not present

## 2021-01-25 DIAGNOSIS — N400 Enlarged prostate without lower urinary tract symptoms: Secondary | ICD-10-CM | POA: Diagnosis present

## 2021-01-25 DIAGNOSIS — Z87891 Personal history of nicotine dependence: Secondary | ICD-10-CM

## 2021-01-25 DIAGNOSIS — Z888 Allergy status to other drugs, medicaments and biological substances status: Secondary | ICD-10-CM | POA: Diagnosis not present

## 2021-01-25 DIAGNOSIS — E785 Hyperlipidemia, unspecified: Secondary | ICD-10-CM | POA: Diagnosis present

## 2021-01-25 DIAGNOSIS — I130 Hypertensive heart and chronic kidney disease with heart failure and stage 1 through stage 4 chronic kidney disease, or unspecified chronic kidney disease: Secondary | ICD-10-CM | POA: Diagnosis present

## 2021-01-25 DIAGNOSIS — G629 Polyneuropathy, unspecified: Secondary | ICD-10-CM | POA: Diagnosis not present

## 2021-01-25 DIAGNOSIS — N183 Chronic kidney disease, stage 3 unspecified: Secondary | ICD-10-CM | POA: Diagnosis present

## 2021-01-25 DIAGNOSIS — K5900 Constipation, unspecified: Secondary | ICD-10-CM | POA: Diagnosis present

## 2021-01-25 DIAGNOSIS — A419 Sepsis, unspecified organism: Principal | ICD-10-CM | POA: Diagnosis present

## 2021-01-25 DIAGNOSIS — Z85828 Personal history of other malignant neoplasm of skin: Secondary | ICD-10-CM

## 2021-01-25 DIAGNOSIS — K5730 Diverticulosis of large intestine without perforation or abscess without bleeding: Secondary | ICD-10-CM | POA: Diagnosis not present

## 2021-01-25 DIAGNOSIS — Z20822 Contact with and (suspected) exposure to covid-19: Secondary | ICD-10-CM | POA: Diagnosis present

## 2021-01-25 DIAGNOSIS — K8012 Calculus of gallbladder with acute and chronic cholecystitis without obstruction: Secondary | ICD-10-CM | POA: Diagnosis not present

## 2021-01-25 DIAGNOSIS — Z79899 Other long term (current) drug therapy: Secondary | ICD-10-CM | POA: Diagnosis not present

## 2021-01-25 DIAGNOSIS — I10 Essential (primary) hypertension: Secondary | ICD-10-CM | POA: Diagnosis not present

## 2021-01-25 DIAGNOSIS — R1011 Right upper quadrant pain: Secondary | ICD-10-CM

## 2021-01-25 DIAGNOSIS — K8066 Calculus of gallbladder and bile duct with acute and chronic cholecystitis without obstruction: Secondary | ICD-10-CM | POA: Diagnosis not present

## 2021-01-25 DIAGNOSIS — R109 Unspecified abdominal pain: Secondary | ICD-10-CM | POA: Diagnosis not present

## 2021-01-25 DIAGNOSIS — Z8249 Family history of ischemic heart disease and other diseases of the circulatory system: Secondary | ICD-10-CM

## 2021-01-25 DIAGNOSIS — R1013 Epigastric pain: Secondary | ICD-10-CM | POA: Diagnosis present

## 2021-01-25 DIAGNOSIS — I447 Left bundle-branch block, unspecified: Secondary | ICD-10-CM | POA: Diagnosis not present

## 2021-01-25 DIAGNOSIS — K859 Acute pancreatitis without necrosis or infection, unspecified: Secondary | ICD-10-CM | POA: Diagnosis not present

## 2021-01-25 DIAGNOSIS — K812 Acute cholecystitis with chronic cholecystitis: Secondary | ICD-10-CM | POA: Diagnosis not present

## 2021-01-25 DIAGNOSIS — R000 Tachycardia, unspecified: Secondary | ICD-10-CM | POA: Diagnosis not present

## 2021-01-25 DIAGNOSIS — Z885 Allergy status to narcotic agent status: Secondary | ICD-10-CM | POA: Diagnosis not present

## 2021-01-25 DIAGNOSIS — I5032 Chronic diastolic (congestive) heart failure: Secondary | ICD-10-CM | POA: Diagnosis present

## 2021-01-25 DIAGNOSIS — G4733 Obstructive sleep apnea (adult) (pediatric): Secondary | ICD-10-CM | POA: Diagnosis present

## 2021-01-25 DIAGNOSIS — K810 Acute cholecystitis: Secondary | ICD-10-CM | POA: Diagnosis not present

## 2021-01-25 DIAGNOSIS — K449 Diaphragmatic hernia without obstruction or gangrene: Secondary | ICD-10-CM | POA: Diagnosis not present

## 2021-01-25 DIAGNOSIS — R9431 Abnormal electrocardiogram [ECG] [EKG]: Secondary | ICD-10-CM | POA: Diagnosis not present

## 2021-01-25 DIAGNOSIS — K219 Gastro-esophageal reflux disease without esophagitis: Secondary | ICD-10-CM | POA: Diagnosis present

## 2021-01-25 DIAGNOSIS — K8000 Calculus of gallbladder with acute cholecystitis without obstruction: Secondary | ICD-10-CM | POA: Diagnosis not present

## 2021-01-25 DIAGNOSIS — K7689 Other specified diseases of liver: Secondary | ICD-10-CM | POA: Diagnosis not present

## 2021-01-25 DIAGNOSIS — R112 Nausea with vomiting, unspecified: Secondary | ICD-10-CM | POA: Diagnosis present

## 2021-01-25 DIAGNOSIS — K8020 Calculus of gallbladder without cholecystitis without obstruction: Secondary | ICD-10-CM | POA: Diagnosis not present

## 2021-01-25 LAB — COMPREHENSIVE METABOLIC PANEL
ALT: 23 U/L (ref 0–44)
AST: 32 U/L (ref 15–41)
Albumin: 4.8 g/dL (ref 3.5–5.0)
Alkaline Phosphatase: 67 U/L (ref 38–126)
Anion gap: 12 (ref 5–15)
BUN: 21 mg/dL (ref 8–23)
CO2: 26 mmol/L (ref 22–32)
Calcium: 8.9 mg/dL (ref 8.9–10.3)
Chloride: 97 mmol/L — ABNORMAL LOW (ref 98–111)
Creatinine, Ser: 1.2 mg/dL (ref 0.61–1.24)
GFR, Estimated: 59 mL/min — ABNORMAL LOW (ref >60)
Glucose, Bld: 175 mg/dL — ABNORMAL HIGH (ref 70–99)
Potassium: 4.3 mmol/L (ref 3.5–5.1)
Sodium: 135 mmol/L (ref 135–145)
Total Bilirubin: 1 mg/dL (ref 0.3–1.2)
Total Protein: 8.8 g/dL — ABNORMAL HIGH (ref 6.5–8.1)

## 2021-01-25 LAB — CBC WITH DIFFERENTIAL/PLATELET
Abs Immature Granulocytes: 0.08 10*3/uL — ABNORMAL HIGH (ref 0.00–0.07)
Basophils Absolute: 0 10*3/uL (ref 0.0–0.1)
Basophils Relative: 0 %
Eosinophils Absolute: 0 10*3/uL (ref 0.0–0.5)
Eosinophils Relative: 0 %
HCT: 48.9 % (ref 39.0–52.0)
Hemoglobin: 16.1 g/dL (ref 13.0–17.0)
Immature Granulocytes: 1 %
Lymphocytes Relative: 6 %
Lymphs Abs: 0.8 10*3/uL (ref 0.7–4.0)
MCH: 28.4 pg (ref 26.0–34.0)
MCHC: 32.9 g/dL (ref 30.0–36.0)
MCV: 86.2 fL (ref 80.0–100.0)
Monocytes Absolute: 0.4 10*3/uL (ref 0.1–1.0)
Monocytes Relative: 3 %
Neutro Abs: 11.8 10*3/uL — ABNORMAL HIGH (ref 1.7–7.7)
Neutrophils Relative %: 90 %
Platelets: 235 10*3/uL (ref 150–400)
RBC: 5.67 MIL/uL (ref 4.22–5.81)
RDW: 12.9 % (ref 11.5–15.5)
WBC: 13.2 10*3/uL — ABNORMAL HIGH (ref 4.0–10.5)
nRBC: 0 % (ref 0.0–0.2)

## 2021-01-25 LAB — URINALYSIS, ROUTINE W REFLEX MICROSCOPIC
Bilirubin Urine: NEGATIVE
Glucose, UA: 50 mg/dL — AB
Hgb urine dipstick: NEGATIVE
Ketones, ur: 5 mg/dL — AB
Leukocytes,Ua: NEGATIVE
Nitrite: NEGATIVE
Protein, ur: >=300 mg/dL — AB
Specific Gravity, Urine: 1.016 (ref 1.005–1.030)
pH: 7 (ref 5.0–8.0)

## 2021-01-25 LAB — LACTIC ACID, PLASMA
Lactic Acid, Venous: 1.6 mmol/L (ref 0.5–1.9)
Lactic Acid, Venous: 2.6 mmol/L (ref 0.5–1.9)
Lactic Acid, Venous: 1.7 mmol/L (ref 0.5–1.9)

## 2021-01-25 LAB — TROPONIN I (HIGH SENSITIVITY)
Troponin I (High Sensitivity): 5 ng/L (ref <18)
Troponin I (High Sensitivity): 6 ng/L (ref <18)

## 2021-01-25 LAB — RESP PANEL BY RT-PCR (FLU A&B, COVID) ARPGX2
Influenza A by PCR: NEGATIVE
Influenza B by PCR: NEGATIVE
SARS Coronavirus 2 by RT PCR: NEGATIVE

## 2021-01-25 LAB — LIPASE, BLOOD: Lipase: 32 U/L (ref 11–51)

## 2021-01-25 LAB — MAGNESIUM: Magnesium: 2.2 mg/dL (ref 1.7–2.4)

## 2021-01-25 MED ORDER — MORPHINE SULFATE (PF) 2 MG/ML IV SOLN
2.0000 mg | Freq: Once | INTRAVENOUS | Status: AC
  Administered 2021-01-25: 2 mg via INTRAVENOUS
  Filled 2021-01-25: qty 1

## 2021-01-25 MED ORDER — ACETAMINOPHEN 650 MG RE SUPP: 650.0000 mg | Freq: Four times a day (QID) | RECTAL | Status: DC | PRN

## 2021-01-25 MED ORDER — HYDROMORPHONE HCL 1 MG/ML IJ SOLN
1.0000 mg | Freq: Once | INTRAMUSCULAR | Status: AC
  Administered 2021-01-25: 1 mg via INTRAVENOUS
  Filled 2021-01-25: qty 1

## 2021-01-25 MED ORDER — LACTATED RINGERS IV BOLUS
1000.0000 mL | Freq: Once | INTRAVENOUS | Status: AC
  Administered 2021-01-25: 1000 mL via INTRAVENOUS

## 2021-01-25 MED ORDER — SODIUM CHLORIDE 0.9 % IV BOLUS
1000.0000 mL | Freq: Once | INTRAVENOUS | Status: AC
  Administered 2021-01-25: 1000 mL via INTRAVENOUS

## 2021-01-25 MED ORDER — ALBUTEROL SULFATE (2.5 MG/3ML) 0.083% IN NEBU: 2.5000 mg | INHALATION_SOLUTION | Freq: Four times a day (QID) | RESPIRATORY_TRACT | Status: DC | PRN

## 2021-01-25 MED ORDER — ALBUTEROL SULFATE HFA 108 (90 BASE) MCG/ACT IN AERS: 2.0000 | INHALATION_SPRAY | Freq: Four times a day (QID) | RESPIRATORY_TRACT | Status: DC | PRN

## 2021-01-25 MED ORDER — IOHEXOL 350 MG/ML SOLN
80.0000 mL | Freq: Once | INTRAVENOUS | Status: AC | PRN
  Administered 2021-01-25: 80 mL via INTRAVENOUS

## 2021-01-25 MED ORDER — ACETAMINOPHEN 325 MG PO TABS
650.0000 mg | ORAL_TABLET | Freq: Four times a day (QID) | ORAL | Status: DC | PRN
  Administered 2021-01-25: 650 mg via ORAL
  Filled 2021-01-25 (×2): qty 2

## 2021-01-25 MED ORDER — ONDANSETRON HCL 4 MG/2ML IJ SOLN
4.0000 mg | Freq: Once | INTRAMUSCULAR | Status: AC
  Administered 2021-01-25: 4 mg via INTRAVENOUS
  Filled 2021-01-25: qty 2

## 2021-01-25 MED ORDER — LACTATED RINGERS IV SOLN: INTRAVENOUS | Status: DC

## 2021-01-25 MED ORDER — AMLODIPINE BESYLATE 5 MG PO TABS
5.0000 mg | ORAL_TABLET | Freq: Every day | ORAL | Status: DC
  Administered 2021-01-25 – 2021-01-29 (×5): 5 mg via ORAL
  Filled 2021-01-25 (×5): qty 1

## 2021-01-25 MED ORDER — PANTOPRAZOLE SODIUM 40 MG IV SOLR
40.0000 mg | INTRAVENOUS | Status: DC
  Administered 2021-01-25 – 2021-01-29 (×5): 40 mg via INTRAVENOUS
  Filled 2021-01-25 (×5): qty 40

## 2021-01-25 MED ORDER — MORPHINE SULFATE (PF) 4 MG/ML IV SOLN
4.0000 mg | Freq: Once | INTRAVENOUS | Status: AC
  Administered 2021-01-25: 4 mg via INTRAVENOUS
  Filled 2021-01-25: qty 1

## 2021-01-25 MED ORDER — ONDANSETRON HCL 4 MG PO TABS: 4.0000 mg | ORAL_TABLET | Freq: Four times a day (QID) | ORAL | Status: DC | PRN

## 2021-01-25 MED ORDER — HALOPERIDOL LACTATE 5 MG/ML IJ SOLN
1.0000 mg | Freq: Once | INTRAMUSCULAR | Status: AC
  Administered 2021-01-25: 1 mg via INTRAVENOUS
  Filled 2021-01-25: qty 1

## 2021-01-25 MED ORDER — METOPROLOL TARTRATE 5 MG/5ML IV SOLN
5.0000 mg | Freq: Once | INTRAVENOUS | Status: AC
  Administered 2021-01-25: 5 mg via INTRAVENOUS
  Filled 2021-01-25: qty 5

## 2021-01-25 MED ORDER — DIPHENHYDRAMINE HCL 50 MG/ML IJ SOLN
12.5000 mg | Freq: Once | INTRAMUSCULAR | Status: AC
  Administered 2021-01-25: 12.5 mg via INTRAVENOUS
  Filled 2021-01-25: qty 1

## 2021-01-25 MED ORDER — GABAPENTIN 300 MG PO CAPS
600.0000 mg | ORAL_CAPSULE | Freq: Every day | ORAL | Status: DC
  Administered 2021-01-25 – 2021-01-29 (×5): 600 mg via ORAL
  Filled 2021-01-25 (×5): qty 2

## 2021-01-25 MED ORDER — TAMSULOSIN HCL 0.4 MG PO CAPS
0.4000 mg | ORAL_CAPSULE | Freq: Every day | ORAL | Status: DC
  Administered 2021-01-25 – 2021-01-29 (×5): 0.4 mg via ORAL
  Filled 2021-01-25 (×5): qty 1

## 2021-01-25 MED ORDER — ONDANSETRON HCL 4 MG/2ML IJ SOLN: 4.0000 mg | Freq: Four times a day (QID) | INTRAMUSCULAR | Status: DC | PRN

## 2021-01-25 NOTE — ED Notes (Signed)
Pt ambulatory to bathroom utilizing personal cane. Requires standby assist for safety

## 2021-01-25 NOTE — ED Notes (Signed)
RN and provider notified of abnormal lab

## 2021-01-25 NOTE — H&P (Signed)
Admitting physician addendum note.  The patient has been having lower extremity pitting edema without any other cardiovascular symptomatology.  He is currently on amlodipine 5 mg p.o. daily but given his age I will obtain an echocardiogram to further evaluate.  Tennis Must, MD.

## 2021-01-25 NOTE — ED Provider Notes (Addendum)
[facility] DEPT Provider Note   CSN: [id number] Arrival date & time: 01/25/21  0331     History Chief Complaint  Patient presents with   Pancreatitis    Timothy Khan is a 87 y.o. male with PMH idiopathic pancreatitis, HLD, HTN, peripheral neuropathy with foot drop, previous subdural, CKD 3 who presents emergency department for evaluation of epigastric abdominal pain.  Has had 4 episodes of nonbloody nonbilious emesis starting at approximately 10 PM on 01/24/2021.  Patient denies chest pain, shortness of breath, cough, fever or other systemic symptoms.  Denies alcohol use.  HPI     Past Medical History:  Diagnosis Date   Abnormal brain MRI    Right side   Basal cell carcinoma of back    BPH (benign prostatic hyperplasia)    Concussion 1950   Baseball injury   Foot drop, bilateral 05/02/2013   Gait disturbance    Gastroesophageal reflux disease    Hyperlipidemia    Hypertension    Lumbago    Peripheral neuropathy    Skull fracture (HCC)    Sleep apnea     Patient Active Problem List   Diagnosis Date Noted   OSA (obstructive sleep apnea) 07/25/2020   Acute-on-chronic kidney injury ([place]) 07/25/2020   Acute pancreatitis 06/06/2018   BPH (benign prostatic hyperplasia) 06/06/2018   LBBB (left bundle branch block) 06/06/2018   GERD (gastroesophageal reflux disease) 06/06/2018   Esophageal thickening 06/06/2018   Hiatal hernia 06/06/2018   Foot drop, bilateral 05/02/2013   SDH (subdural hematoma) 04/30/2013   Polyneuropathy 04/30/2013   Skull fracture ([place]) 04/30/2013   Thoracic or lumbosacral neuritis or radiculitis, unspecified 03/18/2012   Hereditary and idiopathic peripheral neuropathy 03/18/2012   Abnormality of gait 03/18/2012   Concussion with no loss of consciousness 03/18/2012   Other and unspecified hyperlipidemia 03/18/2012   Hypertension 03/18/2012    Past Surgical History:  Procedure Laterality Date   BIOPSY   03/01/2020   Procedure: BIOPSY;  Surgeon: Ronnette Juniper, MD;  Location: [facility];  Service: Gastroenterology;;   CATARACT EXTRACTION Bilateral    ESOPHAGEAL MANOMETRY N/A 01/05/2020   Procedure: ESOPHAGEAL MANOMETRY (EM);  Surgeon: Ronnette Juniper, MD;  Location: [facility];  Service: Gastroenterology;  Laterality: N/A;   ESOPHAGOGASTRODUODENOSCOPY (EGD) WITH PROPOFOL N/A 06/11/2018   Procedure: ESOPHAGOGASTRODUODENOSCOPY (EGD) WITH PROPOFOL;  Surgeon: Clarene Essex, MD;  Location: [facility];  Service: Endoscopy;  Laterality: N/A;   ESOPHAGOGASTRODUODENOSCOPY (EGD) WITH PROPOFOL N/A 03/01/2020   Procedure: ESOPHAGOGASTRODUODENOSCOPY (EGD) WITH PROPOFOL With Botox injections;  Surgeon: Ronnette Juniper, MD;  Location: [facility];  Service: Gastroenterology;  Laterality: N/A;   KNEE SURGERY Left    skin cancer resection     basal cell, Sharon carcinoma   SUBMUCOSAL INJECTION  03/01/2020   Procedure: SUBMUCOSAL INJECTION;  Surgeon: Ronnette Juniper, MD;  Location: [facility];  Service: Gastroenterology;;   TONSILLECTOMY     VASECTOMY         Family History  Problem Relation Age of Onset   Cancer Mother        Breast cancer   Heart attack Father    Heart disease Brother     Social History   Tobacco Use   Smoking status: Former    Types: Cigarettes    Quit date: 08/18/1984    Years since quitting: 36.4   Smokeless tobacco: Never  Vaping Use   Vaping Use: Never used  Substance Use Topics   Alcohol use: No   Drug use: No  Home Medications Prior to Admission medications   Medication Sig Start Date End Date Taking? Authorizing Provider  albuterol (PROVENTIL HFA;VENTOLIN HFA) 108 (90 Base) MCG/ACT inhaler Inhale 2 puffs into the lungs every 6 (six) hours as needed for wheezing or shortness of breath. 06/15/18   Purohit, Konrad Dolores, MD  amLODipine (NORVASC) 5 MG tablet Take 5 mg by mouth daily.  06/24/18   [provider]  cetirizine (ZYRTEC) 10 MG tablet Take 10 mg by mouth at bedtime.      [provider]  Cholecalciferol (VITAMIN D3) 2000 UNITS capsule Take 2,000 Units by mouth every other day.     [provider]  Coenzyme Q10 (CO Q 10) 100 MG CAPS Take 100 mg by mouth daily.    [provider]  esomeprazole (NEXIUM) 20 MG capsule Take 20 mg by mouth daily.     [provider]  fluocinonide (LIDEX) 0.05 % external solution Apply 1 application topically daily. 20 drops or 1 ml on the scalp 02/13/20   [provider]  fluticasone (FLONASE) 50 MCG/ACT nasal spray Place 2 sprays into both nostrils at bedtime.  04/16/13   [provider]  gabapentin (NEURONTIN) 300 MG capsule Take 2 capsules (600 mg total) by mouth at bedtime. 12/04/17   Kathrynn Ducking, MD  losartan (COZAAR) 100 MG tablet Take 100 mg by mouth at bedtime.  05/22/17   [provider]  mometasone (ELOCON) 0.1 % cream Apply 1 application topically daily. 11/28/19   [provider]  ondansetron (ZOFRAN) 4 MG tablet Take 4 mg by mouth 3 (three) times daily as needed for nausea or vomiting. 01/05/20   [provider]  saccharomyces boulardii (FLORASTOR) 250 MG capsule Take 250 mg by mouth 2 (two) times daily.    [provider]  tamsulosin (FLOMAX) 0.4 MG CAPS Take 0.4 mg by mouth at bedtime.     [provider]  traMADol (ULTRAM) 50 MG tablet Take 2 tablets (100 mg total) by mouth daily. Patient taking differently: Take 100 mg by mouth at bedtime. 06/15/18   Purohit, Konrad Dolores, MD  vitamin B-12 (CYANOCOBALAMIN) 1000 MCG tablet Take 1,000 mcg by mouth daily.     [provider]    Allergies    Doxazosin mesylate, Lisinopril, Macrolides and ketolides, and Codeine  Review of Systems   Review of Systems  Constitutional:  Negative for chills and fever.  HENT:  Negative for ear pain and sore throat.   Eyes:  Negative for pain and visual disturbance.  Respiratory:  Negative for cough and shortness of breath.    Cardiovascular:  Negative for chest pain and palpitations.  Gastrointestinal:  Positive for abdominal pain, nausea and vomiting.  Genitourinary:  Negative for dysuria and hematuria.  Musculoskeletal:  Negative for arthralgias and back pain.  Skin:  Negative for color change and rash.  Neurological:  Negative for seizures and syncope.  All other systems reviewed and are negative.  Physical Exam Updated Vital Signs BP (!) 187/95   Pulse (!) 102   Temp 98 F (36.7 C) (Oral)   Resp 20   SpO2 96%   Physical Exam Vitals and nursing note reviewed.  Constitutional:      Appearance: He is well-developed.  HENT:     Head: Normocephalic and atraumatic.  Eyes:     Conjunctiva/sclera: Conjunctivae normal.  Cardiovascular:     Rate and Rhythm: Regular rhythm. Tachycardia present.     Heart sounds: No murmur heard. Pulmonary:  Effort: Pulmonary effort is normal. No respiratory distress.     Breath sounds: Normal breath sounds.  Abdominal:     General: There is distension.     Palpations: Abdomen is soft.     Tenderness: There is abdominal tenderness (Epigastric).  Musculoskeletal:     Cervical back: Neck supple.  Skin:    General: Skin is warm and dry.  Neurological:     Mental Status: He is alert.    ED Results / Procedures / Treatments   Labs (all labs ordered are listed, but only abnormal results are displayed) Labs Reviewed  CBC WITH DIFFERENTIAL/PLATELET - Abnormal; Notable for the following components:      Result Value   WBC 13.2 (*)    Neutro Abs 11.8 (*)    Abs Immature Granulocytes 0.08 (*)    All other components within normal limits  COMPREHENSIVE METABOLIC PANEL - Abnormal; Notable for the following components:   Chloride 97 (*)    Glucose, Bld 175 (*)    Total Protein 8.8 (*)    GFR, Estimated 59 (*)    All other components within normal limits  URINALYSIS, ROUTINE W REFLEX MICROSCOPIC - Abnormal; Notable for the following components:   Glucose, UA 50  (*)    Ketones, ur 5 (*)    Protein, ur >=300 (*)    Bacteria, UA RARE (*)    All other components within normal limits  LACTIC ACID, PLASMA - Abnormal; Notable for the following components:   Lactic Acid, Venous 2.6 (*)    All other components within normal limits  LIPASE, BLOOD  LACTIC ACID, PLASMA  TROPONIN I (HIGH SENSITIVITY)  TROPONIN I (HIGH SENSITIVITY)    EKG EKG Interpretation  Date/Time:  Tuesday January 25 2021 06:17:01 EST Ventricular Rate:  100 PR Interval:  227 QRS Duration: 84 QT Interval:  351 QTC Calculation: 453 R Axis:   28 Text Interpretation: Sinus tachycardia Prolonged PR interval LAE, consider biatrial enlargement 12 Lead; Mason-L[place]ar No significant change was found Confirmed by Ezequiel Essex [phone]) on 01/25/2021 6:22:34 AM  Radiology CT ABDOMEN PELVIS W CONTRAST  Result Date: 01/25/2021 CLINICAL DATA:  Epigastric pain.  History of pancreatitis. EXAM: CT ABDOMEN AND PELVIS WITH CONTRAST TECHNIQUE: Multidetector CT imaging of the abdomen and pelvis was performed using the standard protocol following bolus administration of intravenous contrast. CONTRAST:  59mL OMNIPAQUE IOHEXOL 350 MG/ML SOLN COMPARISON:  CT abdomen pelvis dated Jul 25, 2020. FINDINGS: Lower chest: No acute abnormality. Unchanged mild bibasilar scarring. Hepatobiliary: Multiple simple cysts and subcentimeter low-density lesions within the liver are unchanged. Small gallstones again noted. No gallbladder wall thickening or biliary dilatation. Pancreas: Unremarkable. No pancreatic ductal dilatation or surrounding inflammatory changes. Spleen: Normal in size without focal abnormality. Adrenals/Urinary Tract: Adrenal glands are unremarkable. Kidneys are normal, without renal calculi, focal lesion, or hydronephrosis. Bladder is unremarkable. Stomach/Bowel: Unchanged small hiatal hernia. The stomach is otherwise within normal limits. No bowel wall thickening, distention, or surrounding inflammatory  changes. Sigmoid colonic diverticulosis again noted. Normal appendix. Vascular/Lymphatic: Aortic atherosclerosis. No enlarged abdominal or pelvic lymph nodes. Reproductive: Unchanged prostatomegaly with median lobe hypertrophy indenting the bladder base. Other: No abdominal wall hernia or abnormality. No abdominopelvic ascites. No pneumoperitoneum. Musculoskeletal: No acute or significant osseous findings. Unchanged chronic L1 compression deformity. IMPRESSION: 1. No acute intra-abdominal process. No CT evidence of acute pancreatitis. 2. Cholelithiasis. 3. Aortic Atherosclerosis (ICD10-I70.0). Electronically Signed   By: Titus Dubin M.D.   On: 01/25/2021 10:01    Procedures  Procedures   Medications Ordered in ED Medications  sodium chloride 0.9 % bolus 1,000 mL (1,000 mLs Intravenous New Bag/Given 01/25/21 0816)  ondansetron (ZOFRAN) injection 4 mg (4 mg Intravenous Given 01/25/21 0815)  morphine 4 MG/ML injection 4 mg (4 mg Intravenous Given 01/25/21 0816)  morphine 2 MG/ML injection 2 mg (2 mg Intravenous Given 01/25/21 0912)  iohexol (OMNIPAQUE) 350 MG/ML injection 80 mL (80 mLs Intravenous Contrast Given 01/25/21 0934)    ED Course  I have reviewed the triage vital signs and the nursing notes.  Pertinent labs & imaging results that were available during my care of the patient were reviewed by me and considered in my medical decision making (see chart for details).    MDM Rules/Calculators/A&P                           Patient seen in the emergency department for evaluation of suspected pancreatitis.  Physical exam reveals abdominal distention with tenderness in the epigastrium.  PMH with leukocytosis to 13.2 likely elevated in the setting of stress demargination from patient's vomiting.  Lactate is 2.6, urinalysis unremarkable, chemistry unremarkable.  Lipase normal.  LFTs unremarkable.  CT abdomen pelvis obtained to evaluate for pseudocyst formation and there is no acute findings in the  pelvis or abdomen.  Patient given a total of 6 mg of morphine with no improvement in the patient's pain.  He was trialed on Haldol and Benadryl which also did not improve the patient's pain on reevaluation, patient remains in pain with persistent tachycardia despite fluid resuscitation.  Suspect early pancreatitis or acute on chronic pancreatitis with inability to mount lipase response.  Patient currently unable to tolerate p.o. and I am concerned his bounce back rate is significantly high without pain control and the patient will require admission for observation in the setting of likely early pancreatitis.  Of note, patient technically meets SIRS criteria with tachycardia, leukocytosis.  Patient has had no fevers and his symptoms better explained from persistent abdominal pain in the setting of likely early pancreatitis.  No evidence of acute intra-abdominal infection on contrasted CT at the source of the patient's pain.  We will continue to look for source but this time we will hold off on broad-spectrum antibiotics unless source identified.  Blood cultures drawn. Final Clinical Impression(s) / ED Diagnoses Final diagnoses:  None    Rx / DC Orders ED Discharge Orders     None        Shelbylynn Walczyk, MD 01/25/21 1115    Teressa Lower, MD 01/25/21 1117

## 2021-01-25 NOTE — ED Notes (Signed)
Pt's family member came out saying pt seems "off". Pt received sitting EOB, trying to use the urinal. Pt ended up soiling self. Noted pt O2 desat to 84%. Assisted pt back into bed. Helped pt changed into clean gown. Pt cleaned and laying in bed comfortably. O2 sat up to 94% RA.

## 2021-01-25 NOTE — ED Triage Notes (Signed)
Pt reports having pancreatitis. Pt states that the pain started last night around 10 pm to his upper abdomen.

## 2021-01-25 NOTE — Progress Notes (Signed)
Delay in accepting patient d/t waiting for redraw of lactic acid. Patients last lactic acid was drawn at 0748 and it was 2.8. Pt received 2 fluid bolus and needs a new lactic acid. Awaiting lactic acid results before accepting patient.

## 2021-01-25 NOTE — ED Provider Notes (Signed)
Emergency Medicine Provider Triage Evaluation Note  Timothy Khan , a 87 y.o. male  was evaluated in triage.  Pt complains of epigastric pain with nausea and vomiting reminiscent of previous episodes of pancreatitis.  Pain started about 10 PM has had 4 episodes of vomiting.  Nonbloody nonbilious.  Last BM 3 days ago.  No fever. Still has gallbladder.  Does not drink alcohol..  Review of Systems  Positive: Abdominal pain, nausea and vomiting Negative: Fever, chest pain, shortness of breath  Physical Exam  BP (!) 145/81 (BP Location: Right Arm)   Pulse (!) 102   Temp 98 F (36.7 C) (Oral)   Resp 16   SpO2 96%  Gen:   Awake, no distress   Resp:  Normal effort  MSK:   Moves extremities without difficulty  Other:  Epigastric tenderness, no guarding or rebound Lower extremity edema at baseline per patient.  Medical Decision Making  Medically screening exam initiated at 5:48 AM.  Appropriate orders placed.  JILLIAN PIANKA was informed that the remainder of the evaluation will be completed by another provider, this initial triage assessment does not replace that evaluation, and the importance of remaining in the ED until their evaluation is complete.  Upper abdominal pain with nausea and vomiting similar to previous episodes of pancreatitis. EKG labs, symptom control will likely need imaging   Ezequiel Essex, MD 01/25/21 [phone]

## 2021-01-25 NOTE — H&P (Signed)
History and Physical    [name]:[id number] DOB: Oct 17, 1931 DOA: 01/25/2021  PCP: [name], MD  Patient coming from: Home.  I have personally briefly reviewed patient's old medical records in [place]  Chief Complaint: Abdominal pain, nausea and vomiting.  HPI: Timothy Khan is a 87 year old male with past medical history of idiopathic pancreatitis, hyperlipidemia, hypertension, peripheral neuropathy, subdural hematoma, stage III CKD who presented to the emergency department with epigastric abdominal pain associated with 4 episodes of emesis since yesterday evening.  He, his daughter and caregiver stated that he felt similar to when he has his pancreatitis episodes.  No diarrhea, constipation, melena or hematochezia.  No fever, chills, sore throat, rhinorrhea, wheezing or hemoptysis.  No chest pain, palpitations, diaphoresis, PND, orthopnea but he has lower extremity edema.  No dysuria, frequency or hematuria.  No polyuria, polydipsia, polyphagia or blurred vision.  His caregiver stated that he ate a substantial amount of pistachios in the past few days.  ED Course: Initial vital signs were temperature 98 F, pulse 102, respirations 16, BP 145/81 mmHg and O2 sat 96% on room air.  The patient received a 1000 NS bolus, 1000 mL LR bolus, morphine 4 mg IVP, morphine 2 mg IVP, hydromorphone 1 mg IVP ondansetron 4 mg IVP, Haldol 1 mg IVP and diphenhydramine 12.5 mg IVP.  The patient was somnolent when I evaluated him around 1745.  Lab work: Urinalysis showed glucosuria of 50, ketonuria 5 and proteinuria more than 300 mg/dL.  There were rare bacteria on microscopic examination.  The rest of the urinalysis is unremarkable.  CBC showed a white count of 13.2, hemoglobin 16.1 g/dL platelets 238.  Troponin x2 negative.  Lipase was normal.  Lactic acid was 1.6 and then 2.6 mmol/L.  CMP showed a chloride 97 mmol/L, glucose of 175 mg/dL, total protein of 8.8 g/dL.  The rest of  the CMP was within normal limits.  Imaging: One-view portable chest radiograph shows low lung volumes without radiographic evidence of acute cardiopulmonary disease.  CT abdomen/pelvis without any acute intracranial abdominal process.  No evidence of acute pancreatitis.  Positive cholelithiasis same aortic atherosclerosis.  Please see images and full radiology report for further details.  Review of Systems: As per HPI otherwise all other systems reviewed and are negative.  Past Medical History:  Diagnosis Date   Abnormal brain MRI    Right side   Basal cell carcinoma of back    BPH (benign prostatic hyperplasia)    Concussion 1950   Baseball injury   Foot drop, bilateral 05/02/2013   Gait disturbance    Gastroesophageal reflux disease    Hyperlipidemia    Hypertension    Lumbago    Peripheral neuropathy    [place]    Sleep apnea    Past Surgical History:  Procedure Laterality Date   BIOPSY  03/01/2020   Procedure: BIOPSY;  Surgeon: Ronnette Juniper, MD;  Location: [facility];  Service: Gastroenterology;;   CATARACT EXTRACTION Bilateral    ESOPHAGEAL MANOMETRY N/A 01/05/2020   Procedure: ESOPHAGEAL MANOMETRY (EM);  Surgeon: Ronnette Juniper, MD;  Location: [facility];  Service: Gastroenterology;  Laterality: N/A;   ESOPHAGOGASTRODUODENOSCOPY (EGD) WITH PROPOFOL N/A 06/11/2018   Procedure: ESOPHAGOGASTRODUODENOSCOPY (EGD) WITH PROPOFOL;  Surgeon: Clarene Essex, MD;  Location: [facility];  Service: Endoscopy;  Laterality: N/A;   ESOPHAGOGASTRODUODENOSCOPY (EGD) WITH PROPOFOL N/A 03/01/2020   Procedure: ESOPHAGOGASTRODUODENOSCOPY (EGD) WITH PROPOFOL With Botox injections;  Surgeon: Ronnette Juniper, MD;  Location: [facility];  Service: Gastroenterology;  Laterality: N/A;   KNEE SURGERY Left    skin cancer resection     basal cell, [place] carcinoma   SUBMUCOSAL INJECTION  03/01/2020   Procedure: SUBMUCOSAL INJECTION;  Surgeon: Ronnette Juniper, MD;  Location: [facility];  Service:  Gastroenterology;;   TONSILLECTOMY     VASECTOMY     Social History  reports that he quit smoking about 36 years ago. He has never used smokeless tobacco. He reports that he does not drink alcohol and does not use drugs.  Allergies  Allergen Reactions   Doxazosin Mesylate     Other reaction(s): fatigue   Lisinopril     Other reaction(s): cough   Macrolides And Ketolides     Other reaction(s): rash   Codeine Nausea Only   Family History  Problem Relation Age of Onset   Cancer Mother        Breast cancer   Heart attack Father    Heart disease Brother    Prior to Admission medications   Medication Sig Start Date End Date Taking? Authorizing Provider  albuterol (PROVENTIL HFA;VENTOLIN HFA) 108 (90 Base) MCG/ACT inhaler Inhale 2 puffs into the lungs every 6 (six) hours as needed for wheezing or shortness of breath. 06/15/18  Yes Purohit, Shrey C, MD  amLODipine (NORVASC) 5 MG tablet Take 5 mg by mouth at bedtime. 06/24/18  Yes [provider]  cetirizine (ZYRTEC) 10 MG tablet Take 10 mg by mouth at bedtime.    Yes [provider]  Cholecalciferol (VITAMIN D3) 2000 UNITS capsule Take 2,000 Units by mouth every other day.    Yes [provider]  Coenzyme Q10 (CO Q-10) 300 MG CAPS Take 300 mg by mouth daily.   Yes [provider]  Cyanocobalamin (B-12) 5000 MCG CAPS Take 5,000 mcg by mouth daily.   Yes [provider]  esomeprazole (NEXIUM) 20 MG capsule Take 20 mg by mouth daily.    Yes [provider]  fluocinonide (LIDEX) 0.05 % external solution Apply 1 application topically daily. 20 drops or 1 ml on the scalp 02/13/20  Yes [provider]  fluticasone (FLONASE) 50 MCG/ACT nasal spray Place 2 sprays into both nostrils at bedtime.  04/16/13  Yes [provider]  gabapentin (NEURONTIN) 300 MG capsule Take 2 capsules (600 mg total) by mouth at bedtime. 12/04/17  Yes Kathrynn Ducking, MD  ondansetron (ZOFRAN) 4 MG tablet  Take 4 mg by mouth 3 (three) times daily as needed for nausea or vomiting. 01/05/20  Yes [provider]  saccharomyces boulardii (FLORASTOR) 250 MG capsule Take 250 mg by mouth 2 (two) times daily.   Yes [provider]  tamsulosin (FLOMAX) 0.4 MG CAPS Take 0.4 mg by mouth at bedtime.    Yes [provider]  traMADol (ULTRAM) 50 MG tablet Take 2 tablets (100 mg total) by mouth daily. Patient taking differently: Take 100 mg by mouth at bedtime. 06/15/18  Yes PurohitKonrad Dolores, MD   Physical Exam: Vitals:   01/25/21 0816 01/25/21 0918 01/25/21 1025 01/25/21 1133  BP: (!) 196/94 (!) 187/95 (!) 188/92 (!) 159/76  Pulse: (!) 109 (!) 102 (!) 111 (!) 119  Resp: 20 20 20 20   Temp:      TempSrc:      SpO2: 98% 96% 98% 97%   Constitutional: NAD, calm, comfortable Eyes: PERRL, lids and conjunctivae normal ENMT: Mucous membranes are mildly dry. Posterior pharynx clear of any exudate or lesions. Neck: normal, supple,  no masses, no thyromegaly Respiratory: clear to auscultation bilaterally, no wheezing, no crackles. Normal respiratory effort. No accessory muscle use.  Cardiovascular: Sinus tachycardia at 103 bpm, no murmurs / rubs / gallops.  2+ bilateral lower extremities pitting edema. 2+ pedal pulses. No carotid bruits.  Abdomen: No distention.  Bowel sounds positive.  Soft, no tenderness, no masses palpated. No hepatosplenomegaly. Bowel sounds positive.  Musculoskeletal: no clubbing / cyanosis. Good ROM, no contractures. Normal muscle tone.  Skin: no rashes, lesions, ulcers on limited dermatological examination. Neurologic: CN 2-12 grossly intact. Sensation intact, DTR normal. Strength 5/5 in all 4.  Psychiatric: Somnolent but answers simple questions.  Labs on Admission: I have personally reviewed following labs and imaging studies  CBC: Recent Labs  Lab 01/25/21 0631  WBC 13.2*  NEUTROABS 11.8*  HGB 16.1  HCT 48.9  MCV 86.2  PLT 979   Basic Metabolic  Panel: Recent Labs  Lab 01/25/21 0631  NA 135  K 4.3  CL 97*  CO2 26  GLUCOSE 175*  BUN 21  CREATININE 1.20  CALCIUM 8.9   GFR: CrCl cannot be calculated (Unknown ideal weight.).  Liver Function Tests: Recent Labs  Lab 01/25/21 0631  AST 32  ALT 23  ALKPHOS 67  BILITOT 1.0  PROT 8.8*  ALBUMIN 4.8   Urine analysis:    Component Value Date/Time   COLORURINE YELLOW 01/25/2021 0813   APPEARANCEUR CLEAR 01/25/2021 0813   LABSPEC 1.016 01/25/2021 0813   PHURINE 7.0 01/25/2021 0813   GLUCOSEU 50 (A) 01/25/2021 0813   HGBUR NEGATIVE 01/25/2021 0813   BILIRUBINUR NEGATIVE 01/25/2021 0813   KETONESUR 5 (A) 01/25/2021 0813   PROTEINUR >=300 (A) 01/25/2021 0813   NITRITE NEGATIVE 01/25/2021 0813   LEUKOCYTESUR NEGATIVE 01/25/2021 0813   Radiological Exams on Admission: CT ABDOMEN PELVIS W CONTRAST  Result Date: 01/25/2021 CLINICAL DATA:  Epigastric pain.  History of pancreatitis. EXAM: CT ABDOMEN AND PELVIS WITH CONTRAST TECHNIQUE: Multidetector CT imaging of the abdomen and pelvis was performed using the standard protocol following bolus administration of intravenous contrast. CONTRAST:  14mL OMNIPAQUE IOHEXOL 350 MG/ML SOLN COMPARISON:  CT abdomen pelvis dated Jul 25, 2020. FINDINGS: Lower chest: No acute abnormality. Unchanged mild bibasilar scarring. Hepatobiliary: Multiple simple cysts and subcentimeter low-density lesions within the liver are unchanged. Small gallstones again noted. No gallbladder wall thickening or biliary dilatation. Pancreas: Unremarkable. No pancreatic ductal dilatation or surrounding inflammatory changes. Spleen: Normal in size without focal abnormality. Adrenals/Urinary Tract: Adrenal glands are unremarkable. Kidneys are normal, without renal calculi, focal lesion, or hydronephrosis. Bladder is unremarkable. Stomach/Bowel: Unchanged small hiatal hernia. The stomach is otherwise within normal limits. No bowel wall thickening, distention, or surrounding  inflammatory changes. Sigmoid colonic diverticulosis again noted. Normal appendix. Vascular/Lymphatic: Aortic atherosclerosis. No enlarged abdominal or pelvic lymph nodes. Reproductive: Unchanged prostatomegaly with median lobe hypertrophy indenting the bladder base. Other: No abdominal wall hernia or abnormality. No abdominopelvic ascites. No pneumoperitoneum. Musculoskeletal: No acute or significant osseous findings. Unchanged chronic L1 compression deformity. IMPRESSION: 1. No acute intra-abdominal process. No CT evidence of acute pancreatitis. 2. Cholelithiasis. 3. Aortic Atherosclerosis (ICD10-I70.0). Electronically Signed   By: Titus Dubin M.D.   On: 01/25/2021 10:01   DG Chest Portable 1 View  Result Date: 01/25/2021 CLINICAL DATA:  87 year old male with history of epigastric abdominal pain. Pancreatitis. EXAM: PORTABLE CHEST 1 VIEW COMPARISON:  Chest x-ray 06/13/2018. FINDINGS: Lung volumes are low. No consolidative airspace disease. No pleural effusions. No pneumothorax. No pulmonary nodule or mass noted.  Pulmonary vasculature and the cardiomediastinal silhouette are within normal limits. Multiple old healed left-sided rib fractures. IMPRESSION: 1. Low lung volumes without radiographic evidence of acute cardiopulmonary disease. Electronically Signed   By: Vinnie Langton M.D.   On: 01/25/2021 11:31    EKG: Independently reviewed.  Vent. rate 100 BPM PR interval 227 ms QRS duration 84 ms QT/QTcB 351/453 ms P-R-T axes 74 28 66 Sinus tachycardia Prolonged PR interval LAE, consider biatrial enlargement  Assessment/Plan Principal Problem:   SIRS (systemic inflammatory response syndrome) (HCC) In the setting of   Intractable abdominal pain Associated with   Nausea and vomiting since  Symptoms are typical of his pancreatitis episodes. Observation/telemetry. May have a clear liquids now. Continue IVF. Analgesics as needed. Antiemetics as needed Follow-up blood cultures.  Active  Problems:   Hyperlipidemia Not on medical therapy.    Hypertension Resume amlodipine 10 mg p.o. daily.    BPH (benign prostatic hyperplasia) Continue tamsulosin 0.4 mg at bedtime.    GERD (gastroesophageal reflux disease) Pantoprazole 40 mg IVP every 24 hours.   DVT prophylaxis: SCDs. Code Status:   Full code. Family Communication:   Disposition Plan:   Patient is from:  Home.  Anticipated DC to:  Home.  Anticipated DC date:  01/26/2021 or 01/27/2021.  Anticipated DC barriers: Clinical status.  Consults called:   Admission status:  Observation/telemetry.   Severity of Illness:  Reubin Milan MD [facility]  How to contact the [facility] Attending or Consulting provider [place] or covering provider during after hours [place], for this patient?   Check the care team in [facility] and look for a) attending/consulting [facility] provider listed and b) the [facility] team listed Log into [email or web address] and use [place] universal password to access. If you do not have the password, please contact the hospital operator. Locate the [facility] provider you are looking for under [facility] and page to a number that you can be directly reached. If you still have difficulty reaching the provider, please page the [facility] (Director on Call) for the Hospitalists listed on amion for assistance.  01/25/2021, 1:41 PM   This document was prepared using Dragon voice recognition software and may contain some unintended transcription errors.

## 2021-01-26 ENCOUNTER — Observation Stay (HOSPITAL_COMMUNITY): Payer: Medicare Other

## 2021-01-26 ENCOUNTER — Inpatient Hospital Stay (HOSPITAL_COMMUNITY): Payer: Medicare Other

## 2021-01-26 DIAGNOSIS — R6510 Systemic inflammatory response syndrome (SIRS) of non-infectious origin without acute organ dysfunction: Secondary | ICD-10-CM | POA: Diagnosis not present

## 2021-01-26 DIAGNOSIS — I5032 Chronic diastolic (congestive) heart failure: Secondary | ICD-10-CM | POA: Diagnosis present

## 2021-01-26 DIAGNOSIS — N400 Enlarged prostate without lower urinary tract symptoms: Secondary | ICD-10-CM

## 2021-01-26 DIAGNOSIS — Z20822 Contact with and (suspected) exposure to covid-19: Secondary | ICD-10-CM | POA: Diagnosis present

## 2021-01-26 DIAGNOSIS — R109 Unspecified abdominal pain: Secondary | ICD-10-CM

## 2021-01-26 DIAGNOSIS — R9431 Abnormal electrocardiogram [ECG] [EKG]: Secondary | ICD-10-CM

## 2021-01-26 DIAGNOSIS — G4733 Obstructive sleep apnea (adult) (pediatric): Secondary | ICD-10-CM | POA: Diagnosis present

## 2021-01-26 DIAGNOSIS — Z8249 Family history of ischemic heart disease and other diseases of the circulatory system: Secondary | ICD-10-CM | POA: Diagnosis not present

## 2021-01-26 DIAGNOSIS — K5900 Constipation, unspecified: Secondary | ICD-10-CM | POA: Diagnosis present

## 2021-01-26 DIAGNOSIS — I10 Essential (primary) hypertension: Secondary | ICD-10-CM

## 2021-01-26 DIAGNOSIS — R1013 Epigastric pain: Secondary | ICD-10-CM | POA: Diagnosis present

## 2021-01-26 DIAGNOSIS — I130 Hypertensive heart and chronic kidney disease with heart failure and stage 1 through stage 4 chronic kidney disease, or unspecified chronic kidney disease: Secondary | ICD-10-CM | POA: Diagnosis present

## 2021-01-26 DIAGNOSIS — R112 Nausea with vomiting, unspecified: Secondary | ICD-10-CM

## 2021-01-26 DIAGNOSIS — G629 Polyneuropathy, unspecified: Secondary | ICD-10-CM | POA: Diagnosis present

## 2021-01-26 DIAGNOSIS — A419 Sepsis, unspecified organism: Secondary | ICD-10-CM | POA: Diagnosis present

## 2021-01-26 DIAGNOSIS — Z87891 Personal history of nicotine dependence: Secondary | ICD-10-CM | POA: Diagnosis not present

## 2021-01-26 DIAGNOSIS — Z888 Allergy status to other drugs, medicaments and biological substances status: Secondary | ICD-10-CM | POA: Diagnosis not present

## 2021-01-26 DIAGNOSIS — K8012 Calculus of gallbladder with acute and chronic cholecystitis without obstruction: Secondary | ICD-10-CM | POA: Diagnosis present

## 2021-01-26 DIAGNOSIS — N183 Chronic kidney disease, stage 3 unspecified: Secondary | ICD-10-CM | POA: Diagnosis present

## 2021-01-26 DIAGNOSIS — E785 Hyperlipidemia, unspecified: Secondary | ICD-10-CM | POA: Diagnosis present

## 2021-01-26 DIAGNOSIS — K219 Gastro-esophageal reflux disease without esophagitis: Secondary | ICD-10-CM

## 2021-01-26 DIAGNOSIS — Z885 Allergy status to narcotic agent status: Secondary | ICD-10-CM | POA: Diagnosis not present

## 2021-01-26 DIAGNOSIS — Z79899 Other long term (current) drug therapy: Secondary | ICD-10-CM | POA: Diagnosis not present

## 2021-01-26 DIAGNOSIS — Z85828 Personal history of other malignant neoplasm of skin: Secondary | ICD-10-CM | POA: Diagnosis not present

## 2021-01-26 LAB — COMPREHENSIVE METABOLIC PANEL
ALT: 34 U/L (ref 0–44)
AST: 34 U/L (ref 15–41)
Albumin: 3.5 g/dL (ref 3.5–5.0)
Alkaline Phosphatase: 53 U/L (ref 38–126)
Anion gap: 9 (ref 5–15)
BUN: 17 mg/dL (ref 8–23)
CO2: 25 mmol/L (ref 22–32)
Calcium: 8.1 mg/dL — ABNORMAL LOW (ref 8.9–10.3)
Chloride: 102 mmol/L (ref 98–111)
Creatinine, Ser: 1.21 mg/dL (ref 0.61–1.24)
GFR, Estimated: 58 mL/min — ABNORMAL LOW (ref >60)
Glucose, Bld: 124 mg/dL — ABNORMAL HIGH (ref 70–99)
Potassium: 4.2 mmol/L (ref 3.5–5.1)
Sodium: 136 mmol/L (ref 135–145)
Total Bilirubin: 1.7 mg/dL — ABNORMAL HIGH (ref 0.3–1.2)
Total Protein: 6.9 g/dL (ref 6.5–8.1)

## 2021-01-26 LAB — CBC
HCT: 46.6 % (ref 39.0–52.0)
Hemoglobin: 15.6 g/dL (ref 13.0–17.0)
MCH: 28.9 pg (ref 26.0–34.0)
MCHC: 33.5 g/dL (ref 30.0–36.0)
MCV: 86.3 fL (ref 80.0–100.0)
Platelets: 228 10*3/uL (ref 150–400)
RBC: 5.4 MIL/uL (ref 4.22–5.81)
RDW: 13.3 % (ref 11.5–15.5)
WBC: 18.5 10*3/uL — ABNORMAL HIGH (ref 4.0–10.5)
nRBC: 0 % (ref 0.0–0.2)

## 2021-01-26 LAB — ECHOCARDIOGRAM COMPLETE
AR max vel: 2.97 cm2
AV Area VTI: 2.92 cm2
AV Area mean vel: 2.88 cm2
AV Mean grad: 3 mmHg
AV Peak grad: 6.3 mmHg
Ao pk vel: 1.25 m/s
Area-P 1/2: 5.97 cm2
S' Lateral: 2.1 cm

## 2021-01-26 LAB — BRAIN NATRIURETIC PEPTIDE: B Natriuretic Peptide: 97.9 pg/mL (ref 0.0–100.0)

## 2021-01-26 MED ORDER — ENOXAPARIN SODIUM 40 MG/0.4ML IJ SOSY
40.0000 mg | PREFILLED_SYRINGE | INTRAMUSCULAR | Status: DC
  Administered 2021-01-26 – 2021-01-29 (×3): 40 mg via SUBCUTANEOUS
  Filled 2021-01-26 (×3): qty 0.4

## 2021-01-26 MED ORDER — TRAMADOL HCL 50 MG PO TABS
100.0000 mg | ORAL_TABLET | Freq: Once | ORAL | Status: AC
  Administered 2021-01-26: 100 mg via ORAL
  Filled 2021-01-26: qty 2

## 2021-01-26 MED ORDER — PERFLUTREN LIPID MICROSPHERE
1.0000 mL | INTRAVENOUS | Status: AC | PRN
Start: 2021-01-26 — End: 2021-01-26
  Administered 2021-01-26: 2 mL via INTRAVENOUS
  Filled 2021-01-26: qty 10

## 2021-01-26 NOTE — Progress Notes (Signed)
   01/25/21 2015  Assess: MEWS Score  BP (!) 169/93  Pulse Rate (!) 102  Resp (!) 22  Level of Consciousness Alert  SpO2 96 %  O2 Device Room Air  Assess: MEWS Score  MEWS Temp 0  MEWS Systolic 0  MEWS Pulse 1  MEWS RR 1  MEWS LOC 0  MEWS Score 2  MEWS Score Color Yellow  Assess: if the MEWS score is Yellow or Red  Were vital signs taken at a resting state? Yes  Focused Assessment Change from prior assessment (see assessment flowsheet)  Does the patient meet 2 or more of the SIRS criteria? Yes  Does the patient have a confirmed or suspected source of infection? Yes  Provider and Rapid Response Notified? No (Provider notitied)  MEWS guidelines implemented *See Row Information* Yes  Treat  MEWS Interventions Escalated (See documentation below)  Pain Scale 0-10  Pain Score 0  Take Vital Signs  Increase Vital Sign Frequency  Yellow: Q 2hr X 2 then Q 4hr X 2, if remains yellow, continue Q 4hrs  Escalate  MEWS: Escalate Yellow: discuss with charge nurse/RN and consider discussing with provider and RRT  Notify: Charge Nurse/RN  Name of Charge Nurse/RN Notified Tom R  Date Charge Nurse/RN Notified 01/25/21  Time Charge Nurse/RN Notified 2055  Notify: Provider  Provider Name/Title Olena Heckle  Date Provider Notified 01/25/21  Time Provider Notified 2055  Notification Type Page  Notification Reason Change in status  Provider response Other (Comment)  Date of Provider Response 01/25/21 (pending)  Time of Provider Response 2110  Document  Patient Outcome Other (Comment) (asymptomatic)  Progress note created (see row info) Yes  Assess: SIRS CRITERIA  SIRS Temperature  0  SIRS Pulse 1  SIRS Respirations  1  SIRS WBC 0  SIRS Score Sum  2

## 2021-01-26 NOTE — Progress Notes (Signed)
PROGRESS NOTE  Timothy Khan PTW:[id number] DOB: 1932/03/15 DOA: 01/25/2021 PCP: [name], MD  HPI/Recap of past 24 hours: Timothy Khan is a 87 year old male with past medical history of idiopathic pancreatitis, hyperlipidemia, hypertension, peripheral neuropathy, subdural hematoma, stage III CKD who presented to the ED with epigastric abdominal pain associated with 4 episodes of emesis, felt similar to when he had his pancreatitis episodes. No other new complaints. His caregiver stated that he ate a substantial amount of pistachios in the past few days. In the ED, noted to be tachycardic, otherwise stable, labs showed leukocytosis, LA minimally elevated, lipase WNL otherwise stable.  Chest x-ray unremarkable.  CT abdomen/pelvis without any acute intracranial abdominal process, no evidence of acute pancreatitis, noted cholelithiasis.  Patient admitted for further management.    Today, patient still complains of some epigastric pain, denies any further nausea/vomiting, denies any chest pain, fever/chills, shortness of breath.  Reports significant constipation.       Assessment/Plan: Principal Problem:   SIRS (systemic inflammatory response syndrome) (HCC) Active Problems:   Hyperlipidemia   Hypertension   BPH (benign prostatic hyperplasia)   GERD (gastroesophageal reflux disease)   Intractable abdominal pain   Nausea and vomiting   Epigastric pain   SIRS in the setting of intractable abdominal pain Nausea/vomiting resolved On admission, tachycardic with leukocytosis Currently afebrile, with worsening leukocytosis BC x2 NGTD CT abdomen/pelvis unremarkable, no evidence of acute pancreatitis, noted gallstones RUQ USS pending, if negative, may need HIDA scan Continue clear liquid diet, continue gentle IV fluids Continue PPI Supportive care for now, pending further work-up  GERD Continue IV PPI  Hypertension Continue amlodipine  Chronic diastolic  HF Appears euvolemic Echo shows EF of 65 to 70%, grade 1 diastolic dysfunction  BPH Continue Flomax at bedtime      Estimated body mass index is 25.83 kg/m as calculated from the following:   Height as of 07/25/20: 5\' 10"  (1.778 m).   Weight as of 07/25/20: 81.6 kg.     Code Status: Full  Family Communication: Caregiver at bedside  Disposition Plan: Status is: Inpatient  Remains inpatient appropriate because: Level of care     Consultants: None  Procedures: None  Antimicrobials: None  DVT prophylaxis: Lovenox   Objective: Vitals:   01/25/21 2215 01/25/21 2300 01/26/21 0824 01/26/21 1316  BP: (!) 152/84 (!) 144/74 (!) 141/73 132/71  Pulse: (!) 102 93 100 97  Resp: 20 18 18 20   Temp: 98.6 F (37 C) 98.2 F (36.8 C) 98.1 F (36.7 C) 98.2 F (36.8 C)  TempSrc: Oral Oral    SpO2: 94% 95% 94% 95%    Intake/Output Summary (Last 24 hours) at 01/26/2021 1721 Last data filed at 01/26/2021 1500 Gross per 24 hour  Intake 2941.03 ml  Output --  Net 2941.03 ml   There were no vitals filed for this visit.  Exam: General: NAD  Cardiovascular: S1, S2 present Respiratory: CTAB Abdomen: Soft, tender at epigastric region, mildly distended, bowel sounds present Musculoskeletal: bilateral pedal edema noted Skin: Normal Psychiatry: Normal mood     Data Reviewed: CBC: Recent Labs  Lab 01/25/21 0631 01/26/21 0515  WBC 13.2* 18.5*  NEUTROABS 11.8*  --   HGB 16.1 15.6  HCT 48.9 46.6  MCV 86.2 86.3  PLT 235 017   Basic Metabolic Panel: Recent Labs  Lab 01/25/21 0631 01/26/21 0515  NA 135 136  K 4.3 4.2  CL 97* 102  CO2 26 25  GLUCOSE 175* 124*  BUN  21 17  CREATININE 1.20 1.21  CALCIUM 8.9 8.1*  MG 2.2  --    GFR: CrCl cannot be calculated (Unknown ideal weight.). Liver Function Tests: Recent Labs  Lab 01/25/21 0631 01/26/21 0515  AST 32 34  ALT 23 34  ALKPHOS 67 53  BILITOT 1.0 1.7*  PROT 8.8* 6.9  ALBUMIN 4.8 3.5   Recent Labs  Lab  01/25/21 0631  LIPASE 32   No results for input(s): AMMONIA in the last 168 hours. Coagulation Profile: No results for input(s): INR, PROTIME in the last 168 hours. Cardiac Enzymes: No results for input(s): CKTOTAL, CKMB, CKMBINDEX, TROPONINI in the last 168 hours. BNP (last 3 results) No results for input(s): PROBNP in the last 8760 hours. HbA1C: No results for input(s): HGBA1C in the last 72 hours. CBG: No results for input(s): GLUCAP in the last 168 hours. Lipid Profile: No results for input(s): CHOL, HDL, LDLCALC, TRIG, CHOLHDL, LDLDIRECT in the last 72 hours. Thyroid Function Tests: No results for input(s): TSH, T4TOTAL, FREET4, T3FREE, THYROIDAB in the last 72 hours. Anemia Panel: No results for input(s): VITAMINB12, FOLATE, FERRITIN, TIBC, IRON, RETICCTPCT in the last 72 hours. Urine analysis:    Component Value Date/Time   COLORURINE YELLOW 01/25/2021 0813   APPEARANCEUR CLEAR 01/25/2021 0813   LABSPEC 1.016 01/25/2021 0813   PHURINE 7.0 01/25/2021 0813   GLUCOSEU 50 (A) 01/25/2021 0813   HGBUR NEGATIVE 01/25/2021 0813   BILIRUBINUR NEGATIVE 01/25/2021 0813   KETONESUR 5 (A) 01/25/2021 0813   PROTEINUR >=300 (A) 01/25/2021 0813   NITRITE NEGATIVE 01/25/2021 0813   LEUKOCYTESUR NEGATIVE 01/25/2021 0813   Sepsis Labs: @LABRCNTIP (procalcitonin:4,lacticidven:4)  ) Recent Results (from the past 240 hour(s))  Resp Panel by RT-PCR (Flu A&B, Covid) Nasopharyngeal Swab     Status: None   Collection Time: 01/25/21 12:00 PM   Specimen: Nasopharyngeal Swab; Nasopharyngeal(NP) swabs in vial transport medium  Result Value Ref Range Status   SARS Coronavirus 2 by RT PCR NEGATIVE NEGATIVE Final    Comment: (NOTE) SARS-CoV-2 target nucleic acids are NOT DETECTED.  The SARS-CoV-2 RNA is generally detectable in upper respiratory specimens during the acute phase of infection. The lowest concentration of SARS-CoV-2 viral copies this assay can detect is 138 copies/mL. A negative  result does not preclude SARS-Cov-2 infection and should not be used as the sole basis for treatment or other patient management decisions. A negative result may occur with  improper specimen collection/handling, submission of specimen other than nasopharyngeal swab, presence of viral mutation(s) within the areas targeted by this assay, and inadequate number of viral copies(<138 copies/mL). A negative result must be combined with clinical observations, patient history, and epidemiological information. The expected result is Negative.  Fact Sheet for Patients:  EntrepreneurPulse.com.au  Fact Sheet for Healthcare Providers:  IncredibleEmployment.be  This test is no t yet approved or cleared by the [place] FDA and  has been authorized for detection and/or diagnosis of SARS-CoV-2 by FDA under an Emergency Use Authorization (EUA). This EUA will remain  in effect (meaning this test can be used) for the duration of the COVID-19 declaration under Section 564(b)(1) of the Act, 21 U.S.C.section 360bbb-3(b)(1), unless the authorization is terminated  or revoked sooner.       Influenza A by PCR NEGATIVE NEGATIVE Final   Influenza B by PCR NEGATIVE NEGATIVE Final    Comment: (NOTE) The Xpert Xpress SARS-CoV-2/FLU/RSV plus assay is intended as an aid in the diagnosis of influenza from Nasopharyngeal swab specimens and should  not be used as a sole basis for treatment. Nasal washings and aspirates are unacceptable for Xpert Xpress SARS-CoV-2/FLU/RSV testing.  Fact Sheet for Patients: EntrepreneurPulse.com.au  Fact Sheet for Healthcare Providers: IncredibleEmployment.be  This test is not yet approved or cleared by the [place] FDA and has been authorized for detection and/or diagnosis of SARS-CoV-2 by FDA under an Emergency Use Authorization (EUA). This EUA will remain in effect (meaning this test can be used)  for the duration of the COVID-19 declaration under Section 564(b)(1) of the Act, 21 U.S.C. section 360bbb-3(b)(1), unless the authorization is terminated or revoked.  Performed at [facility], [place] [address] [place], [place] [address]   Blood culture (routine x 2)     Status: None (Preliminary result)   Collection Time: 01/25/21 12:10 PM   Specimen: BLOOD  Result Value Ref Range Status   Specimen Description   Final    BLOOD RIGHT ANTECUBITAL Performed at [facility] [address], [place], [place] [address]    Special Requests   Final    BOTTLES DRAWN AEROBIC AND ANAEROBIC Blood Culture adequate volume Performed at [facility] [address], [place], [place] [address]    Culture   Final    NO GROWTH < 24 HOURS Performed at [facility] [address] [place], [place] [address]    Report Status PENDING  Incomplete  Blood culture (routine x 2)     Status: None (Preliminary result)   Collection Time: 01/25/21 12:25 PM   Specimen: BLOOD  Result Value Ref Range Status   Specimen Description   Final    BLOOD LEFT ANTECUBITAL Performed at [facility] [address] [place], [place] [address]    Special Requests   Final    BOTTLES DRAWN AEROBIC AND ANAEROBIC Blood Culture adequate volume Performed at [facility] [address], [place], [place] [address]    Culture   Final    NO GROWTH < 12 HOURS Performed at [facility] [address] [place], [place] [address]    Report Status PENDING  Incomplete      Studies: ECHOCARDIOGRAM COMPLETE  Result Date: 01/26/2021    ECHOCARDIOGRAM REPORT   Patient Name:   SHRIHAN PUTT Date of Exam: 01/26/2021 Medical Rec #:  [id number]            Height:       70.0 in Accession #:    [id number]           Weight:       180.0 lb Date of Birth:  [birth year]           BSA:          1.996 m Patient Age:    56 years              BP:           141/73 mmHg Patient Gender: M                    HR:           96 bpm. Exam Location:  Inpatient Procedure: 2D Echo, Cardiac Doppler and Color Doppler Indications:    R94.31 Abnormal EKG  History:        Patient has no prior history of Echocardiogram examinations.                 Arrythmias:LBBB; Risk Factors:Hypertension.  Sonographer:    Glo Herring Referring Phys: [id number] [place]  1. Left ventricular ejection fraction, by estimation, is 65 to 70%. The left ventricle has normal function. The left ventricle has no regional wall motion abnormalities. There is mild left ventricular hypertrophy. Left ventricular diastolic parameters are consistent with Grade I diastolic dysfunction (impaired relaxation).  2. Right ventricular systolic function is normal. The right ventricular size is normal. Tricuspid regurgitation signal is inadequate for assessing PA pressure.  3. The mitral valve is normal in structure. No evidence of mitral valve regurgitation.  4. The aortic valve is tricuspid. Aortic valve regurgitation is not visualized. Mild aortic valve sclerosis is present, with no evidence of aortic valve stenosis.  5. The inferior vena cava is normal in size with greater than 50% respiratory variability, suggesting right atrial pressure of 3 mmHg. FINDINGS  Left Ventricle: Left ventricular ejection fraction, by estimation, is 65 to 70%. The left ventricle has normal function. The left ventricle has no regional wall motion abnormalities. The left ventricular internal cavity size was normal in size. There is  mild left ventricular hypertrophy. Left ventricular diastolic parameters are consistent with Grade I diastolic dysfunction (impaired relaxation). Right Ventricle: The right ventricular size is normal. No increase in right ventricular wall thickness. Right ventricular systolic function is normal. Tricuspid regurgitation signal is inadequate for assessing PA pressure. Left  Atrium: Left atrial size was normal in size. Right Atrium: Right atrial size was normal in size. Pericardium: There is no evidence of pericardial effusion. Mitral Valve: The mitral valve is normal in structure. No evidence of mitral valve regurgitation. Tricuspid Valve: The tricuspid valve is normal in structure. Tricuspid valve regurgitation is not demonstrated. Aortic Valve: The aortic valve is tricuspid. Aortic valve regurgitation is not visualized. Mild aortic valve sclerosis is present, with no evidence of aortic valve stenosis. Aortic valve mean gradient measures 3.0 mmHg. Aortic valve peak gradient measures 6.2 mmHg. Aortic valve area, by VTI measures 2.92 cm. Pulmonic Valve: The pulmonic valve was normal in structure. Pulmonic valve regurgitation is not visualized. Aorta: The aortic root is normal in size and structure. Venous: The inferior vena cava is normal in size with greater than 50% respiratory variability, suggesting right atrial pressure of 3 mmHg. IAS/Shunts: No atrial level shunt detected by color flow Doppler.  LEFT VENTRICLE PLAX 2D LVIDd:         3.50 cm   Diastology LVIDs:         2.10 cm   LV e' medial:    4.78 cm/s LV PW:         1.30 cm   LV E/e' medial:  10.7 LV IVS:        1.30 cm   LV e' lateral:   7.46 cm/s LVOT diam:     1.90 cm   LV E/e' lateral: 6.8 LV SV:         61 LV SV Index:   31 LVOT Area:     2.84 cm  RIGHT VENTRICLE RV Basal diam:  3.00 cm RV S prime:     11.50 cm/s LEFT ATRIUM             Index        RIGHT ATRIUM          Index LA diam:        3.50 cm 1.75 cm/m   RA Area:     8.63 cm LA Vol (A2C):   27.9 ml 13.98 ml/m  RA Volume:   16.00 ml 8.02 ml/m LA Vol (A4C):   23.4 ml 11.72 ml/m LA Biplane Vol: 25.6 ml 12.83 ml/m  AORTIC VALVE                    PULMONIC VALVE AV Area (Vmax):    2.97 cm     PV Vmax:       1.01 m/s AV Area (Vmean):   2.88 cm     PV Peak grad:  4.1 mmHg AV Area (VTI):     2.92 cm AV Vmax:           125.00 cm/s AV Vmean:          85.400 cm/s  AV VTI:            0.209 m AV Peak Grad:      6.2 mmHg AV Mean Grad:      3.0 mmHg LVOT Vmax:         131.00 cm/s LVOT Vmean:        86.700 cm/s LVOT VTI:          0.215 m LVOT/AV VTI ratio: 1.03  AORTA Ao Root diam: 3.40 cm Ao Asc diam:  3.40 cm MITRAL VALVE MV Area (PHT): 5.97 cm     SHUNTS MV Decel Time: 127 msec     Systemic VTI:  0.22 m MV E velocity: 51.00 cm/s   Systemic Diam: 1.90 cm MV A velocity: 100.00 cm/s MV E/A ratio:  0.51 Dalton McleanMD Electronically signed by Franki Monte Signature Date/Time: 01/26/2021/4:33:07 PM    Final     Scheduled Meds:  amLODipine  5 mg Oral QHS   gabapentin  600 mg Oral QHS   pantoprazole (PROTONIX) IV  40 mg Intravenous Q24H   tamsulosin  0.4 mg Oral QHS    Continuous Infusions:  lactated ringers 100 mL/hr at 01/26/21 1523     LOS: 0 days     Alma Friendly, MD [facility]  If 7PM-7AM, please contact night-coverage [email or web address] 01/26/2021, 5:21 PM

## 2021-01-27 DIAGNOSIS — R1013 Epigastric pain: Secondary | ICD-10-CM | POA: Diagnosis not present

## 2021-01-27 DIAGNOSIS — K219 Gastro-esophageal reflux disease without esophagitis: Secondary | ICD-10-CM | POA: Diagnosis not present

## 2021-01-27 DIAGNOSIS — N400 Enlarged prostate without lower urinary tract symptoms: Secondary | ICD-10-CM | POA: Diagnosis not present

## 2021-01-27 DIAGNOSIS — R6510 Systemic inflammatory response syndrome (SIRS) of non-infectious origin without acute organ dysfunction: Secondary | ICD-10-CM | POA: Diagnosis not present

## 2021-01-27 LAB — CBC WITH DIFFERENTIAL/PLATELET
Abs Immature Granulocytes: 0.07 10*3/uL (ref 0.00–0.07)
Basophils Absolute: 0 10*3/uL (ref 0.0–0.1)
Basophils Relative: 0 %
Eosinophils Absolute: 0.2 10*3/uL (ref 0.0–0.5)
Eosinophils Relative: 2 %
HCT: 38.7 % — ABNORMAL LOW (ref 39.0–52.0)
Hemoglobin: 12.7 g/dL — ABNORMAL LOW (ref 13.0–17.0)
Immature Granulocytes: 1 %
Lymphocytes Relative: 19 %
Lymphs Abs: 2.1 10*3/uL (ref 0.7–4.0)
MCH: 29.4 pg (ref 26.0–34.0)
MCHC: 32.8 g/dL (ref 30.0–36.0)
MCV: 89.6 fL (ref 80.0–100.0)
Monocytes Absolute: 1 10*3/uL (ref 0.1–1.0)
Monocytes Relative: 9 %
Neutro Abs: 7.7 10*3/uL (ref 1.7–7.7)
Neutrophils Relative %: 69 %
Platelets: 179 10*3/uL (ref 150–400)
RBC: 4.32 MIL/uL (ref 4.22–5.81)
RDW: 13.3 % (ref 11.5–15.5)
WBC: 11 10*3/uL — ABNORMAL HIGH (ref 4.0–10.5)
nRBC: 0 % (ref 0.0–0.2)

## 2021-01-27 LAB — COMPREHENSIVE METABOLIC PANEL
ALT: 28 U/L (ref 0–44)
AST: 22 U/L (ref 15–41)
Albumin: 3.2 g/dL — ABNORMAL LOW (ref 3.5–5.0)
Alkaline Phosphatase: 45 U/L (ref 38–126)
Anion gap: 4 — ABNORMAL LOW (ref 5–15)
BUN: 19 mg/dL (ref 8–23)
CO2: 28 mmol/L (ref 22–32)
Calcium: 8.1 mg/dL — ABNORMAL LOW (ref 8.9–10.3)
Chloride: 105 mmol/L (ref 98–111)
Creatinine, Ser: 1.28 mg/dL — ABNORMAL HIGH (ref 0.61–1.24)
GFR, Estimated: 54 mL/min — ABNORMAL LOW (ref >60)
Glucose, Bld: 94 mg/dL (ref 70–99)
Potassium: 4.1 mmol/L (ref 3.5–5.1)
Sodium: 137 mmol/L (ref 135–145)
Total Bilirubin: 1.5 mg/dL — ABNORMAL HIGH (ref 0.3–1.2)
Total Protein: 6.1 g/dL — ABNORMAL LOW (ref 6.5–8.1)

## 2021-01-27 MED ORDER — TRAMADOL HCL 50 MG PO TABS
100.0000 mg | ORAL_TABLET | Freq: Once | ORAL | Status: AC
  Administered 2021-01-27: 100 mg via ORAL
  Filled 2021-01-27: qty 2

## 2021-01-27 MED ORDER — SODIUM CHLORIDE 0.9 % IV SOLN
2.0000 g | INTRAVENOUS | Status: DC
  Filled 2021-01-27: qty 20

## 2021-01-27 NOTE — Consult Note (Signed)
[facility] Surgery Consult Note  LEANARD DIMAIO [birth year]  [id number].    Requesting MD: Lesia Sago Chief Complaint/Reason for Consult: gallstones  HPI:  Timothy Khan is an 87yo male PMH HTN, CKD, GERD and peripheral neuropathy who presented to [facility] 01/25/21 with epigastric pain. States that it started the evening of 11/7 after dinner. Pain was constant and severe. Associated with multiple episodes of n/v. He reports similar pain twice in the last few years which was secondary to idiopathic pancreatitis, most recently 07/2020. Due to persistent pain he decided to come to the ED.   In the ED he underwent CT scan which showed no acute issues. WBC 13.2>>11. lipase WNL. Tbili mildly elevated 1>>1.5, otherwise LFTs WNL. With persistent pain an u/s was obtained yesterday and revealed cholelithiasis and sludge within gallbladder lumen with moderate gallbladder wall thickening as well as minimal pericholecystic fluid. General surgery asked to see.  He last drank clear liquids at 0915 this AM  Abdominal surgical history: none Anticoagulants: none Denies alcohol use Lives at home with his wife, they have a caregiver PRN Ambulates with a cane  Review of Systems  Constitutional: Negative.   Respiratory: Negative.    Cardiovascular: Negative.   Gastrointestinal:  Positive for abdominal pain, nausea and vomiting.  Genitourinary:  Positive for frequency.   All systems reviewed and otherwise negative except for as above  Family History  Problem Relation Age of Onset   Cancer Mother        Breast cancer   Heart attack Father    Heart disease Brother     Past Medical History:  Diagnosis Date   Abnormal brain MRI    Right side   Basal cell carcinoma of back    BPH (benign prostatic hyperplasia)    Concussion 1950   Baseball injury   Foot drop, bilateral 05/02/2013   Gait disturbance    Gastroesophageal reflux disease    Hyperlipidemia    Hypertension     Lumbago    Peripheral neuropathy    Skull fracture ([place])    Sleep apnea     Past Surgical History:  Procedure Laterality Date   BIOPSY  03/01/2020   Procedure: BIOPSY;  Surgeon: Ronnette Juniper, MD;  Location: [facility];  Service: Gastroenterology;;   CATARACT EXTRACTION Bilateral    ESOPHAGEAL MANOMETRY N/A 01/05/2020   Procedure: ESOPHAGEAL MANOMETRY (EM);  Surgeon: Ronnette Juniper, MD;  Location: [facility];  Service: Gastroenterology;  Laterality: N/A;   ESOPHAGOGASTRODUODENOSCOPY (EGD) WITH PROPOFOL N/A 06/11/2018   Procedure: ESOPHAGOGASTRODUODENOSCOPY (EGD) WITH PROPOFOL;  Surgeon: Clarene Essex, MD;  Location: [facility];  Service: Endoscopy;  Laterality: N/A;   ESOPHAGOGASTRODUODENOSCOPY (EGD) WITH PROPOFOL N/A 03/01/2020   Procedure: ESOPHAGOGASTRODUODENOSCOPY (EGD) WITH PROPOFOL With Botox injections;  Surgeon: Ronnette Juniper, MD;  Location: [facility];  Service: Gastroenterology;  Laterality: N/A;   KNEE SURGERY Left    skin cancer resection     basal cell, [place] carcinoma   SUBMUCOSAL INJECTION  03/01/2020   Procedure: SUBMUCOSAL INJECTION;  Surgeon: Ronnette Juniper, MD;  Location: [facility];  Service: Gastroenterology;;   TONSILLECTOMY     VASECTOMY      Social History:  reports that he quit smoking about 36 years ago. He has never used smokeless tobacco. He reports that he does not drink alcohol and does not use drugs.  Allergies:  Allergies  Allergen Reactions   Doxazosin Mesylate     Other reaction(s): fatigue   Lisinopril     Other reaction(s): cough  Macrolides And Ketolides     Other reaction(s): rash   Codeine Nausea Only    Medications Prior to Admission  Medication Sig Dispense Refill   albuterol (PROVENTIL HFA;VENTOLIN HFA) 108 (90 Base) MCG/ACT inhaler Inhale 2 puffs into the lungs every 6 (six) hours as needed for wheezing or shortness of breath. 1 Inhaler 2   amLODipine (NORVASC) 5 MG tablet Take 5 mg by mouth at bedtime.     cetirizine (ZYRTEC) 10 MG  tablet Take 10 mg by mouth at bedtime.      Cholecalciferol (VITAMIN D3) 2000 UNITS capsule Take 2,000 Units by mouth every other day.      Coenzyme Q10 (CO Q-10) 300 MG CAPS Take 300 mg by mouth daily.     Cyanocobalamin (B-12) 5000 MCG CAPS Take 5,000 mcg by mouth daily.     esomeprazole (NEXIUM) 20 MG capsule Take 20 mg by mouth daily.      fluocinonide (LIDEX) 0.05 % external solution Apply 1 application topically daily. 20 drops or 1 ml on the scalp     fluticasone (FLONASE) 50 MCG/ACT nasal spray Place 2 sprays into both nostrils at bedtime.      gabapentin (NEURONTIN) 300 MG capsule Take 2 capsules (600 mg total) by mouth at bedtime. 180 capsule 3   ondansetron (ZOFRAN) 4 MG tablet Take 4 mg by mouth 3 (three) times daily as needed for nausea or vomiting.     saccharomyces boulardii (FLORASTOR) 250 MG capsule Take 250 mg by mouth 2 (two) times daily.     tamsulosin (FLOMAX) 0.4 MG CAPS Take 0.4 mg by mouth at bedtime.      traMADol (ULTRAM) 50 MG tablet Take 2 tablets (100 mg total) by mouth daily. (Patient taking differently: Take 100 mg by mouth at bedtime.) 30 tablet 0    Prior to Admission medications   Medication Sig Start Date End Date Taking? Authorizing Provider  albuterol (PROVENTIL HFA;VENTOLIN HFA) 108 (90 Base) MCG/ACT inhaler Inhale 2 puffs into the lungs every 6 (six) hours as needed for wheezing or shortness of breath. 06/15/18  Yes Purohit, Shrey C, MD  amLODipine (NORVASC) 5 MG tablet Take 5 mg by mouth at bedtime. 06/24/18  Yes [provider]  cetirizine (ZYRTEC) 10 MG tablet Take 10 mg by mouth at bedtime.    Yes [provider]  Cholecalciferol (VITAMIN D3) 2000 UNITS capsule Take 2,000 Units by mouth every other day.    Yes [provider]  Coenzyme Q10 (CO Q-10) 300 MG CAPS Take 300 mg by mouth daily.   Yes [provider]  Cyanocobalamin (B-12) 5000 MCG CAPS Take 5,000 mcg by mouth daily.   Yes [provider]   esomeprazole (NEXIUM) 20 MG capsule Take 20 mg by mouth daily.    Yes [provider]  fluocinonide (LIDEX) 0.05 % external solution Apply 1 application topically daily. 20 drops or 1 ml on the scalp 02/13/20  Yes [provider]  fluticasone (FLONASE) 50 MCG/ACT nasal spray Place 2 sprays into both nostrils at bedtime.  04/16/13  Yes [provider]  gabapentin (NEURONTIN) 300 MG capsule Take 2 capsules (600 mg total) by mouth at bedtime. 12/04/17  Yes Kathrynn Ducking, MD  ondansetron (ZOFRAN) 4 MG tablet Take 4 mg by mouth 3 (three) times daily as needed for nausea or vomiting. 01/05/20  Yes [provider]  saccharomyces boulardii (FLORASTOR) 250 MG capsule Take 250 mg by mouth 2 (two) times daily.   Yes  [provider]  tamsulosin (FLOMAX) 0.4 MG CAPS Take 0.4 mg by mouth at bedtime.    Yes [provider]  traMADol (ULTRAM) 50 MG tablet Take 2 tablets (100 mg total) by mouth daily. Patient taking differently: Take 100 mg by mouth at bedtime. 06/15/18  Yes Purohit, Konrad Dolores, MD    Blood pressure 122/64, pulse 79, temperature 98.1 F (36.7 C), temperature source Oral, resp. rate 16, SpO2 92 %. Physical Exam: General: pleasant, WD/WN male who is laying in bed in NAD HEENT: head is normocephalic, atraumatic.  Sclera are noninjected.  Pupils equal and round.  Ears and nose without any masses or lesions.  Mouth is pink and moist. Dentition fair Heart: regular, rate, and rhythm.  Normal s1,s2. No obvious murmurs, gallops, or rubs noted.  Palpable pedal pulses bilaterally  Lungs: CTAB, no wheezes, rhonchi, or rales noted.  Respiratory effort nonlabored Abd: soft, ND, +BS, no masses, hernias, or organomegaly. Mild epigastric and RUQ TTP MS: no BUE/BLE edema, calves soft and nontender Skin: warm and dry with no masses, lesions, or rashes Psych: A&Ox4 with an appropriate affect Neuro: cranial nerves grossly intact, equal strength in BUE/BLE  bilaterally, normal speech, thought process intact  Results for orders placed or performed during the hospital encounter of 01/25/21 (from the past 48 hour(s))  Resp Panel by RT-PCR (Flu A&B, Covid) Nasopharyngeal Swab     Status: None   Collection Time: 01/25/21 12:00 PM   Specimen: Nasopharyngeal Swab; Nasopharyngeal(NP) swabs in vial transport medium  Result Value Ref Range   SARS Coronavirus 2 by RT PCR NEGATIVE NEGATIVE    Comment: (NOTE) SARS-CoV-2 target nucleic acids are NOT DETECTED.  The SARS-CoV-2 RNA is generally detectable in upper respiratory specimens during the acute phase of infection. The lowest concentration of SARS-CoV-2 viral copies this assay can detect is 138 copies/mL. A negative result does not preclude SARS-Cov-2 infection and should not be used as the sole basis for treatment or other patient management decisions. A negative result may occur with  improper specimen collection/handling, submission of specimen other than nasopharyngeal swab, presence of viral mutation(s) within the areas targeted by this assay, and inadequate number of viral copies(<138 copies/mL). A negative result must be combined with clinical observations, patient history, and epidemiological information. The expected result is Negative.  Fact Sheet for Patients:  EntrepreneurPulse.com.au  Fact Sheet for Healthcare Providers:  IncredibleEmployment.be  This test is no t yet approved or cleared by the [place] FDA and  has been authorized for detection and/or diagnosis of SARS-CoV-2 by FDA under an Emergency Use Authorization (EUA). This EUA will remain  in effect (meaning this test can be used) for the duration of the COVID-19 declaration under Section 564(b)(1) of the Act, 21 U.S.C.section 360bbb-3(b)(1), unless the authorization is terminated  or revoked sooner.       Influenza A by PCR NEGATIVE NEGATIVE   Influenza B by PCR NEGATIVE  NEGATIVE    Comment: (NOTE) The Xpert Xpress SARS-CoV-2/FLU/RSV plus assay is intended as an aid in the diagnosis of influenza from Nasopharyngeal swab specimens and should not be used as a sole basis for treatment. Nasal washings and aspirates are unacceptable for Xpert Xpress SARS-CoV-2/FLU/RSV testing.  Fact Sheet for Patients: EntrepreneurPulse.com.au  Fact Sheet for Healthcare Providers: IncredibleEmployment.be  This test is not yet approved or cleared by the [place] FDA and has been authorized for detection and/or diagnosis of SARS-CoV-2 by FDA under an Emergency Use Authorization (EUA). This EUA will remain  in effect (meaning this test can be used) for the duration of the COVID-19 declaration under Section 564(b)(1) of the Act, 21 U.S.C. section 360bbb-3(b)(1), unless the authorization is terminated or revoked.  Performed at [facility], [place] [address], [place], [place] [address]   Blood culture (routine x 2)     Status: None (Preliminary result)   Collection Time: 01/25/21 12:10 PM   Specimen: BLOOD  Result Value Ref Range   Specimen Description      BLOOD RIGHT ANTECUBITAL Performed at [facility] [address], [place], [place] [address]    Special Requests      BOTTLES DRAWN AEROBIC AND ANAEROBIC Blood Culture adequate volume Performed at [facility] [address] [place], [place] [address]    Culture      NO GROWTH 2 DAYS Performed at [facility], [place] [address], [place], [place] [address]    Report Status PENDING   Blood culture (routine x 2)     Status: None (Preliminary result)   Collection Time: 01/25/21 12:25 PM   Specimen: BLOOD  Result Value Ref Range   Specimen Description      BLOOD LEFT ANTECUBITAL Performed at [facility] [address] [place], [place] [address]    Special Requests      BOTTLES DRAWN AEROBIC  AND ANAEROBIC Blood Culture adequate volume Performed at [facility] [address], [place], [place] [address]    Culture      NO GROWTH 2 DAYS Performed at [facility], [place] [address], [place], [place] [address]    Report Status PENDING   Lactic acid, plasma     Status: None   Collection Time: 01/25/21  6:32 PM  Result Value Ref Range   Lactic Acid, Venous 1.7 0.5 - 1.9 mmol/L    Comment: Performed at [facility], [place] [address], [place], [place] [address]  Comprehensive metabolic panel     Status: Abnormal   Collection Time: 01/26/21  5:15 AM  Result Value Ref Range   Sodium 136 135 - 145 mmol/L   Potassium 4.2 3.5 - 5.1 mmol/L   Chloride 102 98 - 111 mmol/L   CO2 25 22 - 32 mmol/L   Glucose, Bld 124 (H) 70 - 99 mg/dL    Comment: Glucose reference range applies only to samples taken after fasting for at least 8 hours.   BUN 17 8 - 23 mg/dL   Creatinine, Ser 1.21 0.61 - 1.24 mg/dL   Calcium 8.1 (L) 8.9 - 10.3 mg/dL   Total Protein 6.9 6.5 - 8.1 g/dL   Albumin 3.5 3.5 - 5.0 g/dL   AST 34 15 - 41 U/L   ALT 34 0 - 44 U/L   Alkaline Phosphatase 53 38 - 126 U/L   Total Bilirubin 1.7 (H) 0.3 - 1.2 mg/dL   GFR, Estimated 58 (L) >60 mL/min    Comment: (NOTE) Calculated using the CKD-EPI Creatinine Equation (2021)    Anion gap 9 5 - 15    Comment: Performed at [facility], [place] [address], [place], [place] [address]  CBC     Status: Abnormal   Collection Time: 01/26/21  5:15 AM  Result Value Ref Range   WBC 18.5 (H) 4.0 - 10.5 K/uL   RBC 5.40 4.22 - 5.81 MIL/uL   Hemoglobin 15.6 13.0 - 17.0 g/dL   HCT 46.6 39.0 - 52.0 %  MCV 86.3 80.0 - 100.0 fL   MCH 28.9 26.0 - 34.0 pg   MCHC 33.5 30.0 - 36.0 g/dL   RDW 13.3 11.5 - 15.5 %   Platelets 228 150 - 400 K/uL   nRBC 0.0 0.0 - 0.2 %    Comment: Performed at [facility], [place] [address] [place], [place] [address]  Brain natriuretic peptide      Status: None   Collection Time: 01/26/21  9:34 AM  Result Value Ref Range   B Natriuretic Peptide 97.9 0.0 - 100.0 pg/mL    Comment: Performed at [facility], [place] [address] [place], [place] [address]  Comprehensive metabolic panel     Status: Abnormal   Collection Time: 01/27/21  5:07 AM  Result Value Ref Range   Sodium 137 135 - 145 mmol/L   Potassium 4.1 3.5 - 5.1 mmol/L   Chloride 105 98 - 111 mmol/L   CO2 28 22 - 32 mmol/L   Glucose, Bld 94 70 - 99 mg/dL    Comment: Glucose reference range applies only to samples taken after fasting for at least 8 hours.   BUN 19 8 - 23 mg/dL   Creatinine, Ser 1.28 (H) 0.61 - 1.24 mg/dL   Calcium 8.1 (L) 8.9 - 10.3 mg/dL   Total Protein 6.1 (L) 6.5 - 8.1 g/dL   Albumin 3.2 (L) 3.5 - 5.0 g/dL   AST 22 15 - 41 U/L   ALT 28 0 - 44 U/L   Alkaline Phosphatase 45 38 - 126 U/L   Total Bilirubin 1.5 (H) 0.3 - 1.2 mg/dL   GFR, Estimated 54 (L) >60 mL/min    Comment: (NOTE) Calculated using the CKD-EPI Creatinine Equation (2021)    Anion gap 4 (L) 5 - 15    Comment: Performed at [facility], [place] [address], [place], [place] [address]  CBC with Differential/Platelet     Status: Abnormal   Collection Time: 01/27/21  5:07 AM  Result Value Ref Range   WBC 11.0 (H) 4.0 - 10.5 K/uL   RBC 4.32 4.22 - 5.81 MIL/uL   Hemoglobin 12.7 (L) 13.0 - 17.0 g/dL   HCT 38.7 (L) 39.0 - 52.0 %   MCV 89.6 80.0 - 100.0 fL   MCH 29.4 26.0 - 34.0 pg   MCHC 32.8 30.0 - 36.0 g/dL   RDW 13.3 11.5 - 15.5 %   Platelets 179 150 - 400 K/uL   nRBC 0.0 0.0 - 0.2 %   Neutrophils Relative % 69 %   Neutro Abs 7.7 1.7 - 7.7 K/uL   Lymphocytes Relative 19 %   Lymphs Abs 2.1 0.7 - 4.0 K/uL   Monocytes Relative 9 %   Monocytes Absolute 1.0 0.1 - 1.0 K/uL   Eosinophils Relative 2 %   Eosinophils Absolute 0.2 0.0 - 0.5 K/uL   Basophils Relative 0 %   Basophils Absolute 0.0 0.0 - 0.1 K/uL   Immature Granulocytes 1 %   Abs Immature  Granulocytes 0.07 0.00 - 0.07 K/uL    Comment: Performed at [facility], [place] [address] [place], [place] [address]   DG Chest Portable 1 View  Result Date: 01/25/2021 CLINICAL DATA:  87 year old male with history of epigastric abdominal pain. Pancreatitis. EXAM: PORTABLE CHEST 1 VIEW COMPARISON:  Chest x-ray 06/13/2018. FINDINGS: Lung volumes are low. No consolidative airspace disease. No pleural effusions. No pneumothorax. No pulmonary nodule or mass noted. Pulmonary vasculature and the cardiomediastinal silhouette  are within normal limits. Multiple old healed left-sided rib fractures. IMPRESSION: 1. Low lung volumes without radiographic evidence of acute cardiopulmonary disease. Electronically Signed   By: Vinnie Langton M.D.   On: 01/25/2021 11:31   ECHOCARDIOGRAM COMPLETE  Result Date: 01/26/2021    ECHOCARDIOGRAM REPORT   Patient Name:   JERIS ROSER Date of Exam: 01/26/2021 Medical Rec #:  [id number]            Height:       70.0 in Accession #:    [id number]           Weight:       180.0 lb Date of Birth:  18-Aug-1931           BSA:          1.996 m Patient Age:    73 years             BP:           141/73 mmHg Patient Gender: M                    HR:           96 bpm. Exam Location:  Inpatient Procedure: 2D Echo, Cardiac Doppler and Color Doppler Indications:    R94.31 Abnormal EKG  History:        Patient has no prior history of Echocardiogram examinations.                 Arrythmias:LBBB; Risk Factors:Hypertension.  Sonographer:    Glo Herring Referring Phys: [id number] [place]  1. Left ventricular ejection fraction, by estimation, is 65 to 70%. The left ventricle has normal function. The left ventricle has no regional wall motion abnormalities. There is mild left ventricular hypertrophy. Left ventricular diastolic parameters are consistent with Grade I diastolic dysfunction (impaired relaxation).  2. Right ventricular systolic function is  normal. The right ventricular size is normal. Tricuspid regurgitation signal is inadequate for assessing PA pressure.  3. The mitral valve is normal in structure. No evidence of mitral valve regurgitation.  4. The aortic valve is tricuspid. Aortic valve regurgitation is not visualized. Mild aortic valve sclerosis is present, with no evidence of aortic valve stenosis.  5. The inferior vena cava is normal in size with greater than 50% respiratory variability, suggesting right atrial pressure of 3 mmHg. FINDINGS  Left Ventricle: Left ventricular ejection fraction, by estimation, is 65 to 70%. The left ventricle has normal function. The left ventricle has no regional wall motion abnormalities. The left ventricular internal cavity size was normal in size. There is  mild left ventricular hypertrophy. Left ventricular diastolic parameters are consistent with Grade I diastolic dysfunction (impaired relaxation). Right Ventricle: The right ventricular size is normal. No increase in right ventricular wall thickness. Right ventricular systolic function is normal. Tricuspid regurgitation signal is inadequate for assessing PA pressure. Left Atrium: Left atrial size was normal in size. Right Atrium: Right atrial size was normal in size. Pericardium: There is no evidence of pericardial effusion. Mitral Valve: The mitral valve is normal in structure. No evidence of mitral valve regurgitation. Tricuspid Valve: The tricuspid valve is normal in structure. Tricuspid valve regurgitation is not demonstrated. Aortic Valve: The aortic valve is tricuspid. Aortic valve regurgitation is not visualized. Mild aortic valve sclerosis is present, with no evidence of aortic valve stenosis. Aortic valve mean gradient measures 3.0 mmHg. Aortic valve peak gradient measures 6.2 mmHg. Aortic valve area, by VTI measures  2.92 cm. Pulmonic Valve: The pulmonic valve was normal in structure. Pulmonic valve regurgitation is not visualized. Aorta: The aortic  root is normal in size and structure. Venous: The inferior vena cava is normal in size with greater than 50% respiratory variability, suggesting right atrial pressure of 3 mmHg. IAS/Shunts: No atrial level shunt detected by color flow Doppler.  LEFT VENTRICLE PLAX 2D LVIDd:         3.50 cm   Diastology LVIDs:         2.10 cm   LV e' medial:    4.78 cm/s LV PW:         1.30 cm   LV E/e' medial:  10.7 LV IVS:        1.30 cm   LV e' lateral:   7.46 cm/s LVOT diam:     1.90 cm   LV E/e' lateral: 6.8 LV SV:         61 LV SV Index:   31 LVOT Area:     2.84 cm  RIGHT VENTRICLE RV Basal diam:  3.00 cm RV S prime:     11.50 cm/s LEFT ATRIUM             Index        RIGHT ATRIUM          Index LA diam:        3.50 cm 1.75 cm/m   RA Area:     8.63 cm LA Vol (A2C):   27.9 ml 13.98 ml/m  RA Volume:   16.00 ml 8.02 ml/m LA Vol (A4C):   23.4 ml 11.72 ml/m LA Biplane Vol: 25.6 ml 12.83 ml/m  AORTIC VALVE                    PULMONIC VALVE AV Area (Vmax):    2.97 cm     PV Vmax:       1.01 m/s AV Area (Vmean):   2.88 cm     PV Peak grad:  4.1 mmHg AV Area (VTI):     2.92 cm AV Vmax:           125.00 cm/s AV Vmean:          85.400 cm/s AV VTI:            0.209 m AV Peak Grad:      6.2 mmHg AV Mean Grad:      3.0 mmHg LVOT Vmax:         131.00 cm/s LVOT Vmean:        86.700 cm/s LVOT VTI:          0.215 m LVOT/AV VTI ratio: 1.03  AORTA Ao Root diam: 3.40 cm Ao Asc diam:  3.40 cm MITRAL VALVE MV Area (PHT): 5.97 cm     SHUNTS MV Decel Time: 127 msec     Systemic VTI:  0.22 m MV E velocity: 51.00 cm/s   Systemic Diam: 1.90 cm MV A velocity: 100.00 cm/s MV E/A ratio:  0.51 Dalton McleanMD Electronically signed by Franki Monte Signature Date/Time: 01/26/2021/4:33:07 PM    Final    US Abdomen Limited RUQ (LIVER/GB)  Result Date: 01/26/2021 CLINICAL DATA:  Abdominal pain. EXAM: ULTRASOUND ABDOMEN LIMITED RIGHT UPPER QUADRANT COMPARISON:  June 12, 2018. FINDINGS: Gallbladder: Cholelithiasis is noted with moderate gallbladder  wall thickening measuring 5 mm. Pericholecystic fluid is noted as well as sludge within the gallbladder lumen. No sonographic Murphy's sign is noted. Common bile duct:  Diameter: 6 mm which is within normal limits. Liver: No focal lesion identified. Within normal limits in parenchymal echogenicity. Portal vein is patent on color Doppler imaging with normal direction of blood flow towards the liver. Other: None. IMPRESSION: Cholelithiasis and sludge is noted within gallbladder lumen with moderate gallbladder wall thickening present as well as minimal pericholecystic fluid. These findings concerning for possible cholecystitis. HIDA scan is recommended for further evaluation. Electronically Signed   By: Marijo Conception M.D.   On: 01/26/2021 17:54      Assessment/Plan Acute cholecystitis - Patient with clinical and radiographic findings concerning for acute calculous cholecystitis. He has a slightly elevated bilirubin and h/o idiopathic pancreatitis, although lipase normal this admission. Recommend laparoscopic cholecystectomy with IOC. Will make him NPO, start IV rocephin. Plan for surgery later today.   ID - rocephin VTE - SCDs, lovenox FEN - IVF, NPO Foley - none  HTN Chronic diastolic HF - ECHO 43/7/35 with EF 65-70% CKD GERD Peripheral neuropathy - on gabapentin and tramadol BPH Hx idiopathic pancreatitis  [place]ellington [place], [facility] Surgery 01/27/2021, 10:33 AM Please see Amion for pager number during day hours 7:00am-4:30pm

## 2021-01-27 NOTE — H&P (View-Only) (Signed)
[facility] Surgery Consult Note  Timothy WESTRY [birth year]  [id number].    Requesting MD: Lesia Sago Chief Complaint/Reason for Consult: gallstones  HPI:  Timothy Khan is an 87yo male PMH HTN, CKD, GERD and peripheral neuropathy who presented to [facility] 01/25/21 with epigastric pain. States that it started the evening of 11/7 after dinner. Pain was constant and severe. Associated with multiple episodes of n/v. He reports similar pain twice in the last few years which was secondary to idiopathic pancreatitis, most recently 07/2020. Due to persistent pain he decided to come to the ED.   In the ED he underwent CT scan which showed no acute issues. WBC 13.2>>11. lipase WNL. Tbili mildly elevated 1>>1.5, otherwise LFTs WNL. With persistent pain an u/s was obtained yesterday and revealed cholelithiasis and sludge within gallbladder lumen with moderate gallbladder wall thickening as well as minimal pericholecystic fluid. General surgery asked to see.  He last drank clear liquids at 0915 this AM  Abdominal surgical history: none Anticoagulants: none Denies alcohol use Lives at home with his wife, they have a caregiver PRN Ambulates with a cane  Review of Systems  Constitutional: Negative.   Respiratory: Negative.    Cardiovascular: Negative.   Gastrointestinal:  Positive for abdominal pain, nausea and vomiting.  Genitourinary:  Positive for frequency.   All systems reviewed and otherwise negative except for as above  Family History  Problem Relation Age of Onset   Cancer Mother        Breast cancer   Heart attack Father    Heart disease Brother     Past Medical History:  Diagnosis Date   Abnormal brain MRI    Right side   Basal cell carcinoma of back    BPH (benign prostatic hyperplasia)    Concussion 1950   Baseball injury   Foot drop, bilateral 05/02/2013   Gait disturbance    Gastroesophageal reflux disease    Hyperlipidemia    Hypertension     Lumbago    Peripheral neuropathy    Skull fracture ([place])    Sleep apnea     Past Surgical History:  Procedure Laterality Date   BIOPSY  03/01/2020   Procedure: BIOPSY;  Surgeon: Ronnette Juniper, MD;  Location: [facility];  Service: Gastroenterology;;   CATARACT EXTRACTION Bilateral    ESOPHAGEAL MANOMETRY N/A 01/05/2020   Procedure: ESOPHAGEAL MANOMETRY (EM);  Surgeon: Ronnette Juniper, MD;  Location: [facility];  Service: Gastroenterology;  Laterality: N/A;   ESOPHAGOGASTRODUODENOSCOPY (EGD) WITH PROPOFOL N/A 06/11/2018   Procedure: ESOPHAGOGASTRODUODENOSCOPY (EGD) WITH PROPOFOL;  Surgeon: Clarene Essex, MD;  Location: [facility];  Service: Endoscopy;  Laterality: N/A;   ESOPHAGOGASTRODUODENOSCOPY (EGD) WITH PROPOFOL N/A 03/01/2020   Procedure: ESOPHAGOGASTRODUODENOSCOPY (EGD) WITH PROPOFOL With Botox injections;  Surgeon: Ronnette Juniper, MD;  Location: [facility];  Service: Gastroenterology;  Laterality: N/A;   KNEE SURGERY Left    skin cancer resection     basal cell, [place] carcinoma   SUBMUCOSAL INJECTION  03/01/2020   Procedure: SUBMUCOSAL INJECTION;  Surgeon: Ronnette Juniper, MD;  Location: [facility];  Service: Gastroenterology;;   TONSILLECTOMY     VASECTOMY      Social History:  reports that he quit smoking about 36 years ago. He has never used smokeless tobacco. He reports that he does not drink alcohol and does not use drugs.  Allergies:  Allergies  Allergen Reactions   Doxazosin Mesylate     Other reaction(s): fatigue   Lisinopril     Other reaction(s): cough  Macrolides And Ketolides     Other reaction(s): rash   Codeine Nausea Only    Medications Prior to Admission  Medication Sig Dispense Refill   albuterol (PROVENTIL HFA;VENTOLIN HFA) 108 (90 Base) MCG/ACT inhaler Inhale 2 puffs into the lungs every 6 (six) hours as needed for wheezing or shortness of breath. 1 Inhaler 2   amLODipine (NORVASC) 5 MG tablet Take 5 mg by mouth at bedtime.     cetirizine (ZYRTEC) 10 MG  tablet Take 10 mg by mouth at bedtime.      Cholecalciferol (VITAMIN D3) 2000 UNITS capsule Take 2,000 Units by mouth every other day.      Coenzyme Q10 (CO Q-10) 300 MG CAPS Take 300 mg by mouth daily.     Cyanocobalamin (B-12) 5000 MCG CAPS Take 5,000 mcg by mouth daily.     esomeprazole (NEXIUM) 20 MG capsule Take 20 mg by mouth daily.      fluocinonide (LIDEX) 0.05 % external solution Apply 1 application topically daily. 20 drops or 1 ml on the scalp     fluticasone (FLONASE) 50 MCG/ACT nasal spray Place 2 sprays into both nostrils at bedtime.      gabapentin (NEURONTIN) 300 MG capsule Take 2 capsules (600 mg total) by mouth at bedtime. 180 capsule 3   ondansetron (ZOFRAN) 4 MG tablet Take 4 mg by mouth 3 (three) times daily as needed for nausea or vomiting.     saccharomyces boulardii (FLORASTOR) 250 MG capsule Take 250 mg by mouth 2 (two) times daily.     tamsulosin (FLOMAX) 0.4 MG CAPS Take 0.4 mg by mouth at bedtime.      traMADol (ULTRAM) 50 MG tablet Take 2 tablets (100 mg total) by mouth daily. (Patient taking differently: Take 100 mg by mouth at bedtime.) 30 tablet 0    Prior to Admission medications   Medication Sig Start Date End Date Taking? Authorizing Provider  albuterol (PROVENTIL HFA;VENTOLIN HFA) 108 (90 Base) MCG/ACT inhaler Inhale 2 puffs into the lungs every 6 (six) hours as needed for wheezing or shortness of breath. 06/15/18  Yes Purohit, Shrey C, MD  amLODipine (NORVASC) 5 MG tablet Take 5 mg by mouth at bedtime. 06/24/18  Yes [provider]  cetirizine (ZYRTEC) 10 MG tablet Take 10 mg by mouth at bedtime.    Yes [provider]  Cholecalciferol (VITAMIN D3) 2000 UNITS capsule Take 2,000 Units by mouth every other day.    Yes [provider]  Coenzyme Q10 (CO Q-10) 300 MG CAPS Take 300 mg by mouth daily.   Yes [provider]  Cyanocobalamin (B-12) 5000 MCG CAPS Take 5,000 mcg by mouth daily.   Yes [provider]   esomeprazole (NEXIUM) 20 MG capsule Take 20 mg by mouth daily.    Yes [provider]  fluocinonide (LIDEX) 0.05 % external solution Apply 1 application topically daily. 20 drops or 1 ml on the scalp 02/13/20  Yes [provider]  fluticasone (FLONASE) 50 MCG/ACT nasal spray Place 2 sprays into both nostrils at bedtime.  04/16/13  Yes [provider]  gabapentin (NEURONTIN) 300 MG capsule Take 2 capsules (600 mg total) by mouth at bedtime. 12/04/17  Yes Kathrynn Ducking, MD  ondansetron (ZOFRAN) 4 MG tablet Take 4 mg by mouth 3 (three) times daily as needed for nausea or vomiting. 01/05/20  Yes [provider]  saccharomyces boulardii (FLORASTOR) 250 MG capsule Take 250 mg by mouth 2 (two) times daily.   Yes  [provider]  tamsulosin (FLOMAX) 0.4 MG CAPS Take 0.4 mg by mouth at bedtime.    Yes [provider]  traMADol (ULTRAM) 50 MG tablet Take 2 tablets (100 mg total) by mouth daily. Patient taking differently: Take 100 mg by mouth at bedtime. 06/15/18  Yes Purohit, Konrad Dolores, MD    Blood pressure 122/64, pulse 79, temperature 98.1 F (36.7 C), temperature source Oral, resp. rate 16, SpO2 92 %. Physical Exam: General: pleasant, WD/WN male who is laying in bed in NAD HEENT: head is normocephalic, atraumatic.  Sclera are noninjected.  Pupils equal and round.  Ears and nose without any masses or lesions.  Mouth is pink and moist. Dentition fair Heart: regular, rate, and rhythm.  Normal s1,s2. No obvious murmurs, gallops, or rubs noted.  Palpable pedal pulses bilaterally  Lungs: CTAB, no wheezes, rhonchi, or rales noted.  Respiratory effort nonlabored Abd: soft, ND, +BS, no masses, hernias, or organomegaly. Mild epigastric and RUQ TTP MS: no BUE/BLE edema, calves soft and nontender Skin: warm and dry with no masses, lesions, or rashes Psych: A&Ox4 with an appropriate affect Neuro: cranial nerves grossly intact, equal strength in BUE/BLE  bilaterally, normal speech, thought process intact  Results for orders placed or performed during the hospital encounter of 01/25/21 (from the past 48 hour(s))  Resp Panel by RT-PCR (Flu A&B, Covid) Nasopharyngeal Swab     Status: None   Collection Time: 01/25/21 12:00 PM   Specimen: Nasopharyngeal Swab; Nasopharyngeal(NP) swabs in vial transport medium  Result Value Ref Range   SARS Coronavirus 2 by RT PCR NEGATIVE NEGATIVE    Comment: (NOTE) SARS-CoV-2 target nucleic acids are NOT DETECTED.  The SARS-CoV-2 RNA is generally detectable in upper respiratory specimens during the acute phase of infection. The lowest concentration of SARS-CoV-2 viral copies this assay can detect is 138 copies/mL. A negative result does not preclude SARS-Cov-2 infection and should not be used as the sole basis for treatment or other patient management decisions. A negative result may occur with  improper specimen collection/handling, submission of specimen other than nasopharyngeal swab, presence of viral mutation(s) within the areas targeted by this assay, and inadequate number of viral copies(<138 copies/mL). A negative result must be combined with clinical observations, patient history, and epidemiological information. The expected result is Negative.  Fact Sheet for Patients:  EntrepreneurPulse.com.au  Fact Sheet for Healthcare Providers:  IncredibleEmployment.be  This test is no t yet approved or cleared by the [place] FDA and  has been authorized for detection and/or diagnosis of SARS-CoV-2 by FDA under an Emergency Use Authorization (EUA). This EUA will remain  in effect (meaning this test can be used) for the duration of the COVID-19 declaration under Section 564(b)(1) of the Act, 21 U.S.C.section 360bbb-3(b)(1), unless the authorization is terminated  or revoked sooner.       Influenza A by PCR NEGATIVE NEGATIVE   Influenza B by PCR NEGATIVE  NEGATIVE    Comment: (NOTE) The Xpert Xpress SARS-CoV-2/FLU/RSV plus assay is intended as an aid in the diagnosis of influenza from Nasopharyngeal swab specimens and should not be used as a sole basis for treatment. Nasal washings and aspirates are unacceptable for Xpert Xpress SARS-CoV-2/FLU/RSV testing.  Fact Sheet for Patients: EntrepreneurPulse.com.au  Fact Sheet for Healthcare Providers: IncredibleEmployment.be  This test is not yet approved or cleared by the [place] FDA and has been authorized for detection and/or diagnosis of SARS-CoV-2 by FDA under an Emergency Use Authorization (EUA). This EUA will remain  in effect (meaning this test can be used) for the duration of the COVID-19 declaration under Section 564(b)(1) of the Act, 21 U.S.C. section 360bbb-3(b)(1), unless the authorization is terminated or revoked.  Performed at [facility], [place] [address] [place], [place] [address]   Blood culture (routine x 2)     Status: None (Preliminary result)   Collection Time: 01/25/21 12:10 PM   Specimen: BLOOD  Result Value Ref Range   Specimen Description      BLOOD RIGHT ANTECUBITAL Performed at [place] [address] [place], [place] [address]    Special Requests      BOTTLES DRAWN AEROBIC AND ANAEROBIC Blood Culture adequate volume Performed at [place] [address], [place], [place] [address]    Culture      NO GROWTH 2 DAYS Performed at [facility], [place] [address] [place], [place] [address]    Report Status PENDING   Blood culture (routine x 2)     Status: None (Preliminary result)   Collection Time: 01/25/21 12:25 PM   Specimen: BLOOD  Result Value Ref Range   Specimen Description      BLOOD LEFT ANTECUBITAL Performed at [place] [address], [place], [place] [address]    Special Requests      BOTTLES DRAWN AEROBIC  AND ANAEROBIC Blood Culture adequate volume Performed at [facility] [place] [address] [place], [place] [address]    Culture      NO GROWTH 2 DAYS Performed at [facility], [place] [address], [place], [place] [address]    Report Status PENDING   Lactic acid, plasma     Status: None   Collection Time: 01/25/21  6:32 PM  Result Value Ref Range   Lactic Acid, Venous 1.7 0.5 - 1.9 mmol/L    Comment: Performed at [facility], [place] [address] [place], [place] [address]  Comprehensive metabolic panel     Status: Abnormal   Collection Time: 01/26/21  5:15 AM  Result Value Ref Range   Sodium 136 135 - 145 mmol/L   Potassium 4.2 3.5 - 5.1 mmol/L   Chloride 102 98 - 111 mmol/L   CO2 25 22 - 32 mmol/L   Glucose, Bld 124 (H) 70 - 99 mg/dL    Comment: Glucose reference range applies only to samples taken after fasting for at least 8 hours.   BUN 17 8 - 23 mg/dL   Creatinine, Ser 1.21 0.61 - 1.24 mg/dL   Calcium 8.1 (L) 8.9 - 10.3 mg/dL   Total Protein 6.9 6.5 - 8.1 g/dL   Albumin 3.5 3.5 - 5.0 g/dL   AST 34 15 - 41 U/L   ALT 34 0 - 44 U/L   Alkaline Phosphatase 53 38 - 126 U/L   Total Bilirubin 1.7 (H) 0.3 - 1.2 mg/dL   GFR, Estimated 58 (L) >60 mL/min    Comment: (NOTE) Calculated using the CKD-EPI Creatinine Equation (2021)    Anion gap 9 5 - 15    Comment: Performed at [facility], [place] [address], [place], [place] [address]  CBC     Status: Abnormal   Collection Time: 01/26/21  5:15 AM  Result Value Ref Range   WBC 18.5 (H) 4.0 - 10.5 K/uL   RBC 5.40 4.22 - 5.81 MIL/uL   Hemoglobin 15.6 13.0 - 17.0 g/dL   HCT 46.6 39.0 - 52.0 %  MCV 86.3 80.0 - 100.0 fL   MCH 28.9 26.0 - 34.0 pg   MCHC 33.5 30.0 - 36.0 g/dL   RDW 13.3 11.5 - 15.5 %   Platelets 228 150 - 400 K/uL   nRBC 0.0 0.0 - 0.2 %    Comment: Performed at [facility], [place] [address], [place], [place] [address]  Brain natriuretic peptide      Status: None   Collection Time: 01/26/21  9:34 AM  Result Value Ref Range   B Natriuretic Peptide 97.9 0.0 - 100.0 pg/mL    Comment: Performed at [facility], [place] [address], [place], [place] [address]  Comprehensive metabolic panel     Status: Abnormal   Collection Time: 01/27/21  5:07 AM  Result Value Ref Range   Sodium 137 135 - 145 mmol/L   Potassium 4.1 3.5 - 5.1 mmol/L   Chloride 105 98 - 111 mmol/L   CO2 28 22 - 32 mmol/L   Glucose, Bld 94 70 - 99 mg/dL    Comment: Glucose reference range applies only to samples taken after fasting for at least 8 hours.   BUN 19 8 - 23 mg/dL   Creatinine, Ser 1.28 (H) 0.61 - 1.24 mg/dL   Calcium 8.1 (L) 8.9 - 10.3 mg/dL   Total Protein 6.1 (L) 6.5 - 8.1 g/dL   Albumin 3.2 (L) 3.5 - 5.0 g/dL   AST 22 15 - 41 U/L   ALT 28 0 - 44 U/L   Alkaline Phosphatase 45 38 - 126 U/L   Total Bilirubin 1.5 (H) 0.3 - 1.2 mg/dL   GFR, Estimated 54 (L) >60 mL/min    Comment: (NOTE) Calculated using the CKD-EPI Creatinine Equation (2021)    Anion gap 4 (L) 5 - 15    Comment: Performed at [facility] [place] [address], [place], [place] [address]  CBC with Differential/Platelet     Status: Abnormal   Collection Time: 01/27/21  5:07 AM  Result Value Ref Range   WBC 11.0 (H) 4.0 - 10.5 K/uL   RBC 4.32 4.22 - 5.81 MIL/uL   Hemoglobin 12.7 (L) 13.0 - 17.0 g/dL   HCT 38.7 (L) 39.0 - 52.0 %   MCV 89.6 80.0 - 100.0 fL   MCH 29.4 26.0 - 34.0 pg   MCHC 32.8 30.0 - 36.0 g/dL   RDW 13.3 11.5 - 15.5 %   Platelets 179 150 - 400 K/uL   nRBC 0.0 0.0 - 0.2 %   Neutrophils Relative % 69 %   Neutro Abs 7.7 1.7 - 7.7 K/uL   Lymphocytes Relative 19 %   Lymphs Abs 2.1 0.7 - 4.0 K/uL   Monocytes Relative 9 %   Monocytes Absolute 1.0 0.1 - 1.0 K/uL   Eosinophils Relative 2 %   Eosinophils Absolute 0.2 0.0 - 0.5 K/uL   Basophils Relative 0 %   Basophils Absolute 0.0 0.0 - 0.1 K/uL   Immature Granulocytes 1 %   Abs Immature  Granulocytes 0.07 0.00 - 0.07 K/uL    Comment: Performed at [facility], [place] [address], [place], [place] [address]   DG Chest Portable 1 View  Result Date: 01/25/2021 CLINICAL DATA:  87 year old male with history of epigastric abdominal pain. Pancreatitis. EXAM: PORTABLE CHEST 1 VIEW COMPARISON:  Chest x-ray 06/13/2018. FINDINGS: Lung volumes are low. No consolidative airspace disease. No pleural effusions. No pneumothorax. No pulmonary nodule or mass noted. Pulmonary vasculature and the cardiomediastinal silhouette  are within normal limits. Multiple old healed left-sided rib fractures. IMPRESSION: 1. Low lung volumes without radiographic evidence of acute cardiopulmonary disease. Electronically Signed   By: Vinnie Langton M.D.   On: 01/25/2021 11:31   ECHOCARDIOGRAM COMPLETE  Result Date: 01/26/2021    ECHOCARDIOGRAM REPORT   Patient Name:   BELFORD PASCUCCI Date of Exam: 01/26/2021 Medical Rec #:  [id number]            Height:       70.0 in Accession #:    [id number]           Weight:       180.0 lb Date of Birth:  12-13-1931           BSA:          1.996 m Patient Age:    5 years             BP:           141/73 mmHg Patient Gender: M                    HR:           96 bpm. Exam Location:  Inpatient Procedure: 2D Echo, Cardiac Doppler and Color Doppler Indications:    R94.31 Abnormal EKG  History:        Patient has no prior history of Echocardiogram examinations.                 Arrythmias:LBBB; Risk Factors:Hypertension.  Sonographer:    Glo Herring Referring Phys: [id number] [place]  1. Left ventricular ejection fraction, by estimation, is 65 to 70%. The left ventricle has normal function. The left ventricle has no regional wall motion abnormalities. There is mild left ventricular hypertrophy. Left ventricular diastolic parameters are consistent with Grade I diastolic dysfunction (impaired relaxation).  2. Right ventricular systolic function is  normal. The right ventricular size is normal. Tricuspid regurgitation signal is inadequate for assessing PA pressure.  3. The mitral valve is normal in structure. No evidence of mitral valve regurgitation.  4. The aortic valve is tricuspid. Aortic valve regurgitation is not visualized. Mild aortic valve sclerosis is present, with no evidence of aortic valve stenosis.  5. The inferior vena cava is normal in size with greater than 50% respiratory variability, suggesting right atrial pressure of 3 mmHg. FINDINGS  Left Ventricle: Left ventricular ejection fraction, by estimation, is 65 to 70%. The left ventricle has normal function. The left ventricle has no regional wall motion abnormalities. The left ventricular internal cavity size was normal in size. There is  mild left ventricular hypertrophy. Left ventricular diastolic parameters are consistent with Grade I diastolic dysfunction (impaired relaxation). Right Ventricle: The right ventricular size is normal. No increase in right ventricular wall thickness. Right ventricular systolic function is normal. Tricuspid regurgitation signal is inadequate for assessing PA pressure. Left Atrium: Left atrial size was normal in size. Right Atrium: Right atrial size was normal in size. Pericardium: There is no evidence of pericardial effusion. Mitral Valve: The mitral valve is normal in structure. No evidence of mitral valve regurgitation. Tricuspid Valve: The tricuspid valve is normal in structure. Tricuspid valve regurgitation is not demonstrated. Aortic Valve: The aortic valve is tricuspid. Aortic valve regurgitation is not visualized. Mild aortic valve sclerosis is present, with no evidence of aortic valve stenosis. Aortic valve mean gradient measures 3.0 mmHg. Aortic valve peak gradient measures 6.2 mmHg. Aortic valve area, by VTI measures  2.92 cm. Pulmonic Valve: The pulmonic valve was normal in structure. Pulmonic valve regurgitation is not visualized. Aorta: The aortic  root is normal in size and structure. Venous: The inferior vena cava is normal in size with greater than 50% respiratory variability, suggesting right atrial pressure of 3 mmHg. IAS/Shunts: No atrial level shunt detected by color flow Doppler.  LEFT VENTRICLE PLAX 2D LVIDd:         3.50 cm   Diastology LVIDs:         2.10 cm   LV e' medial:    4.78 cm/s LV PW:         1.30 cm   LV E/e' medial:  10.7 LV IVS:        1.30 cm   LV e' lateral:   7.46 cm/s LVOT diam:     1.90 cm   LV E/e' lateral: 6.8 LV SV:         61 LV SV Index:   31 LVOT Area:     2.84 cm  RIGHT VENTRICLE RV Basal diam:  3.00 cm RV S prime:     11.50 cm/s LEFT ATRIUM             Index        RIGHT ATRIUM          Index LA diam:        3.50 cm 1.75 cm/m   RA Area:     8.63 cm LA Vol (A2C):   27.9 ml 13.98 ml/m  RA Volume:   16.00 ml 8.02 ml/m LA Vol (A4C):   23.4 ml 11.72 ml/m LA Biplane Vol: 25.6 ml 12.83 ml/m  AORTIC VALVE                    PULMONIC VALVE AV Area (Vmax):    2.97 cm     PV Vmax:       1.01 m/s AV Area (Vmean):   2.88 cm     PV Peak grad:  4.1 mmHg AV Area (VTI):     2.92 cm AV Vmax:           125.00 cm/s AV Vmean:          85.400 cm/s AV VTI:            0.209 m AV Peak Grad:      6.2 mmHg AV Mean Grad:      3.0 mmHg LVOT Vmax:         131.00 cm/s LVOT Vmean:        86.700 cm/s LVOT VTI:          0.215 m LVOT/AV VTI ratio: 1.03  AORTA Ao Root diam: 3.40 cm Ao Asc diam:  3.40 cm MITRAL VALVE MV Area (PHT): 5.97 cm     SHUNTS MV Decel Time: 127 msec     Systemic VTI:  0.22 m MV E velocity: 51.00 cm/s   Systemic Diam: 1.90 cm MV A velocity: 100.00 cm/s MV E/A ratio:  0.51 Dalton McleanMD Electronically signed by Franki Monte Signature Date/Time: 01/26/2021/4:33:07 PM    Final    US Abdomen Limited RUQ (LIVER/GB)  Result Date: 01/26/2021 CLINICAL DATA:  Abdominal pain. EXAM: ULTRASOUND ABDOMEN LIMITED RIGHT UPPER QUADRANT COMPARISON:  June 12, 2018. FINDINGS: Gallbladder: Cholelithiasis is noted with moderate gallbladder  wall thickening measuring 5 mm. Pericholecystic fluid is noted as well as sludge within the gallbladder lumen. No sonographic Murphy's sign is noted. Common bile duct:  Diameter: 6 mm which is within normal limits. Liver: No focal lesion identified. Within normal limits in parenchymal echogenicity. Portal vein is patent on color Doppler imaging with normal direction of blood flow towards the liver. Other: None. IMPRESSION: Cholelithiasis and sludge is noted within gallbladder lumen with moderate gallbladder wall thickening present as well as minimal pericholecystic fluid. These findings concerning for possible cholecystitis. HIDA scan is recommended for further evaluation. Electronically Signed   By: Marijo Conception M.D.   On: 01/26/2021 17:54      Assessment/Plan Acute cholecystitis - Patient with clinical and radiographic findings concerning for acute calculous cholecystitis. He has a slightly elevated bilirubin and h/o idiopathic pancreatitis, although lipase normal this admission. Recommend laparoscopic cholecystectomy with IOC. Will make him NPO, start IV rocephin. Plan for surgery later today.   ID - rocephin VTE - SCDs, lovenox FEN - IVF, NPO Foley - none  HTN Chronic diastolic HF - ECHO 58/4/83 with EF 65-70% CKD GERD Peripheral neuropathy - on gabapentin and tramadol BPH Hx idiopathic pancreatitis  [place], [facility] 01/27/2021, 10:33 AM Please see Amion for pager number during day hours 7:00am-4:30pm

## 2021-01-27 NOTE — Progress Notes (Signed)
PROGRESS NOTE  Timothy Khan OAC:[id number] DOB: 11-17-1931 DOA: 01/25/2021 PCP: [name], MD  HPI/Recap of past 24 hours: Timothy Khan is a 87 year old male with past medical history of idiopathic pancreatitis, hyperlipidemia, hypertension, peripheral neuropathy, subdural hematoma, stage III CKD who presented to the ED with epigastric abdominal pain associated with 4 episodes of emesis, felt similar to when he had his pancreatitis episodes. No other new complaints. His caregiver stated that he ate a substantial amount of pistachios in the past few days. In the ED, noted to be tachycardic, otherwise stable, labs showed leukocytosis, LA minimally elevated, lipase WNL otherwise stable.  Chest x-ray unremarkable.  CT abdomen/pelvis without any acute intracranial abdominal process, no evidence of acute pancreatitis, noted cholelithiasis.  Patient admitted for further management.    Today, pt still with epigastric pain worse on deep breathing. Denies further n/v, no fever/chills, chest pain, SOB. Had a BM       Assessment/Plan: Principal Problem:   SIRS (systemic inflammatory response syndrome) (HCC) Active Problems:   Hyperlipidemia   Hypertension   BPH (benign prostatic hyperplasia)   GERD (gastroesophageal reflux disease)   Intractable abdominal pain   Nausea and vomiting   Epigastric pain   Sepsis likely 2/2 acute cholecystitis  On admission, tachycardic with leukocytosis Currently afebrile, with leukocytosis BC x2 NGTD CT abdomen/pelvis unremarkable, no evidence of acute pancreatitis, noted gallstones RUQ USS notable for acute cholecystitis General surgery consulted, plan for lap chole on 01/27/21 Continue gentle IV fluids Monitor closely  GERD Continue IV PPI  Hypertension Continue amlodipine  Chronic diastolic HF Appears euvolemic Echo shows EF of 65 to 01%, grade 1 diastolic dysfunction  BPH Continue Flomax at bedtime      Estimated body  mass index is 25.83 kg/m as calculated from the following:   Height as of 07/25/20: 5\' 10"  (1.778 m).   Weight as of 07/25/20: 81.6 kg.     Code Status: Full  Family Communication: None at bedside  Disposition Plan: Status is: Inpatient  Remains inpatient appropriate because: Level of care     Consultants: General surgery   Procedures: None  Antimicrobials: None  DVT prophylaxis: Lovenox   Objective: Vitals:   01/26/21 2108 01/27/21 0503 01/27/21 1007 01/27/21 1357  BP: 126/70 110/67 122/64 131/67  Pulse: 98 68 79 80  Resp: 18 20 16 18   Temp: 98.4 F (36.9 C) 98 F (36.7 C) 98.1 F (36.7 C) 98.1 F (36.7 C)  TempSrc: Oral Oral Oral Oral  SpO2: 96% 97% 92% 96%    Intake/Output Summary (Last 24 hours) at 01/27/2021 1637 Last data filed at 01/27/2021 1359 Gross per 24 hour  Intake 1876.69 ml  Output --  Net 1876.69 ml   There were no vitals filed for this visit.  Exam: General: NAD  Cardiovascular: S1, S2 present Respiratory: CTAB Abdomen: Soft, tender at epigastric region, nondistended, bowel sounds present Musculoskeletal: Trace bilateral pedal edema noted Skin: Normal Psychiatry: Normal mood     Data Reviewed: CBC: Recent Labs  Lab 01/25/21 0631 01/26/21 0515 01/27/21 0507  WBC 13.2* 18.5* 11.0*  NEUTROABS 11.8*  --  7.7  HGB 16.1 15.6 12.7*  HCT 48.9 46.6 38.7*  MCV 86.2 86.3 89.6  PLT 235 228 093   Basic Metabolic Panel: Recent Labs  Lab 01/25/21 0631 01/26/21 0515 01/27/21 0507  NA 135 136 137  K 4.3 4.2 4.1  CL 97* 102 105  CO2 26 25 28   GLUCOSE 175* 124* 94  BUN  21 17 19   CREATININE 1.20 1.21 1.28*  CALCIUM 8.9 8.1* 8.1*  MG 2.2  --   --    GFR: CrCl cannot be calculated (Unknown ideal weight.). Liver Function Tests: Recent Labs  Lab 01/25/21 0631 01/26/21 0515 01/27/21 0507  AST 32 34 22  ALT 23 34 28  ALKPHOS 67 53 45  BILITOT 1.0 1.7* 1.5*  PROT 8.8* 6.9 6.1*  ALBUMIN 4.8 3.5 3.2*   Recent Labs  Lab  01/25/21 0631  LIPASE 32   No results for input(s): AMMONIA in the last 168 hours. Coagulation Profile: No results for input(s): INR, PROTIME in the last 168 hours. Cardiac Enzymes: No results for input(s): CKTOTAL, CKMB, CKMBINDEX, TROPONINI in the last 168 hours. BNP (last 3 results) No results for input(s): PROBNP in the last 8760 hours. HbA1C: No results for input(s): HGBA1C in the last 72 hours. CBG: No results for input(s): GLUCAP in the last 168 hours. Lipid Profile: No results for input(s): CHOL, HDL, LDLCALC, TRIG, CHOLHDL, LDLDIRECT in the last 72 hours. Thyroid Function Tests: No results for input(s): TSH, T4TOTAL, FREET4, T3FREE, THYROIDAB in the last 72 hours. Anemia Panel: No results for input(s): VITAMINB12, FOLATE, FERRITIN, TIBC, IRON, RETICCTPCT in the last 72 hours. Urine analysis:    Component Value Date/Time   COLORURINE YELLOW 01/25/2021 0813   APPEARANCEUR CLEAR 01/25/2021 0813   LABSPEC 1.016 01/25/2021 0813   PHURINE 7.0 01/25/2021 0813   GLUCOSEU 50 (A) 01/25/2021 0813   HGBUR NEGATIVE 01/25/2021 0813   BILIRUBINUR NEGATIVE 01/25/2021 0813   KETONESUR 5 (A) 01/25/2021 0813   PROTEINUR >=300 (A) 01/25/2021 0813   NITRITE NEGATIVE 01/25/2021 0813   LEUKOCYTESUR NEGATIVE 01/25/2021 0813   Sepsis Labs: @LABRCNTIP (procalcitonin:4,lacticidven:4)  ) Recent Results (from the past 240 hour(s))  Resp Panel by RT-PCR (Flu A&B, Covid) Nasopharyngeal Swab     Status: None   Collection Time: 01/25/21 12:00 PM   Specimen: Nasopharyngeal Swab; Nasopharyngeal(NP) swabs in vial transport medium  Result Value Ref Range Status   SARS Coronavirus 2 by RT PCR NEGATIVE NEGATIVE Final    Comment: (NOTE) SARS-CoV-2 target nucleic acids are NOT DETECTED.  The SARS-CoV-2 RNA is generally detectable in upper respiratory specimens during the acute phase of infection. The lowest concentration of SARS-CoV-2 viral copies this assay can detect is 138 copies/mL. A negative  result does not preclude SARS-Cov-2 infection and should not be used as the sole basis for treatment or other patient management decisions. A negative result may occur with  improper specimen collection/handling, submission of specimen other than nasopharyngeal swab, presence of viral mutation(s) within the areas targeted by this assay, and inadequate number of viral copies(<138 copies/mL). A negative result must be combined with clinical observations, patient history, and epidemiological information. The expected result is Negative.  Fact Sheet for Patients:  EntrepreneurPulse.com.au  Fact Sheet for Healthcare Providers:  IncredibleEmployment.be  This test is no t yet approved or cleared by the [place] FDA and  has been authorized for detection and/or diagnosis of SARS-CoV-2 by FDA under an Emergency Use Authorization (EUA). This EUA will remain  in effect (meaning this test can be used) for the duration of the COVID-19 declaration under Section 564(b)(1) of the Act, 21 U.S.C.section 360bbb-3(b)(1), unless the authorization is terminated  or revoked sooner.       Influenza A by PCR NEGATIVE NEGATIVE Final   Influenza B by PCR NEGATIVE NEGATIVE Final    Comment: (NOTE) The Xpert Xpress SARS-CoV-2/FLU/RSV plus assay is intended  as an aid in the diagnosis of influenza from Nasopharyngeal swab specimens and should not be used as a sole basis for treatment. Nasal washings and aspirates are unacceptable for Xpert Xpress SARS-CoV-2/FLU/RSV testing.  Fact Sheet for Patients: EntrepreneurPulse.com.au  Fact Sheet for Healthcare Providers: IncredibleEmployment.be  This test is not yet approved or cleared by the [place] FDA and has been authorized for detection and/or diagnosis of SARS-CoV-2 by FDA under an Emergency Use Authorization (EUA). This EUA will remain in effect (meaning this test can be used)  for the duration of the COVID-19 declaration under Section 564(b)(1) of the Act, 21 U.S.C. section 360bbb-3(b)(1), unless the authorization is terminated or revoked.  Performed at [facility], [place] [address], [place], [place] [address]   Blood culture (routine x 2)     Status: None (Preliminary result)   Collection Time: 01/25/21 12:10 PM   Specimen: BLOOD  Result Value Ref Range Status   Specimen Description   Final    BLOOD RIGHT ANTECUBITAL Performed at [facility] [address] [place], [place] [address]    Special Requests   Final    BOTTLES DRAWN AEROBIC AND ANAEROBIC Blood Culture adequate volume Performed at [facility] [address] [place], [place] [address]    Culture   Final    NO GROWTH 2 DAYS Performed at [facility] [address], [place], [place] [address]    Report Status PENDING  Incomplete  Blood culture (routine x 2)     Status: None (Preliminary result)   Collection Time: 01/25/21 12:25 PM   Specimen: BLOOD  Result Value Ref Range Status   Specimen Description   Final    BLOOD LEFT ANTECUBITAL Performed at [facility] [address], [place], [place] [address]    Special Requests   Final    BOTTLES DRAWN AEROBIC AND ANAEROBIC Blood Culture adequate volume Performed at [facility] [address], [place], [place] [address]    Culture   Final    NO GROWTH 2 DAYS Performed at [facility] [address] [place], [place] [address]    Report Status PENDING  Incomplete      Studies: US Abdomen Limited RUQ (LIVER/GB)  Result Date: 01/26/2021 CLINICAL DATA:  Abdominal pain. EXAM: ULTRASOUND ABDOMEN LIMITED RIGHT UPPER QUADRANT COMPARISON:  June 12, 2018. FINDINGS: Gallbladder: Cholelithiasis is noted with moderate gallbladder wall thickening measuring 5 mm. Pericholecystic fluid is noted as well as sludge within the gallbladder  lumen. No sonographic Murphy's sign is noted. Common bile duct: Diameter: 6 mm which is within normal limits. Liver: No focal lesion identified. Within normal limits in parenchymal echogenicity. Portal vein is patent on color Doppler imaging with normal direction of blood flow towards the liver. Other: None. IMPRESSION: Cholelithiasis and sludge is noted within gallbladder lumen with moderate gallbladder wall thickening present as well as minimal pericholecystic fluid. These findings concerning for possible cholecystitis. HIDA scan is recommended for further evaluation. Electronically Signed   By: Marijo Conception M.D.   On: 01/26/2021 17:54    Scheduled Meds:  amLODipine  5 mg Oral QHS   enoxaparin (LOVENOX) injection  40 mg Subcutaneous Q24H   gabapentin  600 mg Oral QHS   pantoprazole (PROTONIX) IV  40 mg Intravenous Q24H   tamsulosin  0.4 mg Oral QHS    Continuous Infusions:  cefTRIAXone (ROCEPHIN)  IV     lactated ringers 75  mL/hr at 01/27/21 1537     LOS: 1 day     Alma Friendly, MD [facility]  If 7PM-7AM, please contact night-coverage [email or web address] 01/27/2021, 4:37 PM

## 2021-01-27 NOTE — Plan of Care (Signed)
  Problem: Education: Goal: Knowledge of General Education information will improve Description: Including pain rating scale, medication(s)/side effects and non-pharmacologic comfort measures Outcome: Progressing   Problem: Health Behavior/Discharge Planning: Goal: Ability to manage health-related needs will improve Outcome: Progressing   Problem: Clinical Measurements: Goal: Ability to maintain clinical measurements within normal limits will improve Outcome: Progressing Goal: Will remain free from infection Outcome: Progressing Goal: Diagnostic test results will improve Outcome: Progressing Goal: Cardiovascular complication will be avoided Outcome: Progressing   Problem: Nutrition: Goal: Adequate nutrition will be maintained Outcome: Progressing   Problem: Pain Managment: Goal: General experience of comfort will improve Outcome: Progressing   Problem: Safety: Goal: Ability to remain free from injury will improve Outcome: Progressing   Problem: Skin Integrity: Goal: Risk for impaired skin integrity will decrease Outcome: Progressing

## 2021-01-28 ENCOUNTER — Inpatient Hospital Stay (HOSPITAL_COMMUNITY): Payer: Medicare Other | Admitting: Anesthesiology

## 2021-01-28 ENCOUNTER — Encounter (HOSPITAL_COMMUNITY): Payer: Self-pay | Admitting: Internal Medicine

## 2021-01-28 ENCOUNTER — Encounter (HOSPITAL_COMMUNITY)
Admission: EM | Disposition: A | Discharge status: Home Health | Payer: Self-pay | Source: Home / Self Care | Attending: Internal Medicine

## 2021-01-28 ENCOUNTER — Inpatient Hospital Stay (HOSPITAL_COMMUNITY): Payer: Medicare Other

## 2021-01-28 DIAGNOSIS — E785 Hyperlipidemia, unspecified: Secondary | ICD-10-CM | POA: Diagnosis not present

## 2021-01-28 DIAGNOSIS — K219 Gastro-esophageal reflux disease without esophagitis: Secondary | ICD-10-CM | POA: Diagnosis not present

## 2021-01-28 DIAGNOSIS — R1013 Epigastric pain: Secondary | ICD-10-CM | POA: Diagnosis not present

## 2021-01-28 DIAGNOSIS — R6510 Systemic inflammatory response syndrome (SIRS) of non-infectious origin without acute organ dysfunction: Secondary | ICD-10-CM | POA: Diagnosis not present

## 2021-01-28 DIAGNOSIS — K810 Acute cholecystitis: Secondary | ICD-10-CM | POA: Diagnosis not present

## 2021-01-28 DIAGNOSIS — N400 Enlarged prostate without lower urinary tract symptoms: Secondary | ICD-10-CM | POA: Diagnosis not present

## 2021-01-28 DIAGNOSIS — I447 Left bundle-branch block, unspecified: Secondary | ICD-10-CM | POA: Diagnosis not present

## 2021-01-28 HISTORY — PX: LAPAROSCOPIC CHOLECYSTECTOMY SINGLE SITE WITH INTRAOPERATIVE CHOLANGIOGRAM: SHX6538

## 2021-01-28 LAB — CBC WITH DIFFERENTIAL/PLATELET
Abs Immature Granulocytes: 0.04 10*3/uL (ref 0.00–0.07)
Basophils Absolute: 0 10*3/uL (ref 0.0–0.1)
Basophils Relative: 0 %
Eosinophils Absolute: 0.3 10*3/uL (ref 0.0–0.5)
Eosinophils Relative: 4 %
HCT: 38.9 % — ABNORMAL LOW (ref 39.0–52.0)
Hemoglobin: 12.5 g/dL — ABNORMAL LOW (ref 13.0–17.0)
Immature Granulocytes: 1 %
Lymphocytes Relative: 16 %
Lymphs Abs: 1.3 10*3/uL (ref 0.7–4.0)
MCH: 29 pg (ref 26.0–34.0)
MCHC: 32.1 g/dL (ref 30.0–36.0)
MCV: 90.3 fL (ref 80.0–100.0)
Monocytes Absolute: 0.8 10*3/uL (ref 0.1–1.0)
Monocytes Relative: 9 %
Neutro Abs: 5.8 10*3/uL (ref 1.7–7.7)
Neutrophils Relative %: 70 %
Platelets: 192 10*3/uL (ref 150–400)
RBC: 4.31 MIL/uL (ref 4.22–5.81)
RDW: 13.2 % (ref 11.5–15.5)
WBC: 8.2 10*3/uL (ref 4.0–10.5)
nRBC: 0 % (ref 0.0–0.2)

## 2021-01-28 LAB — COMPREHENSIVE METABOLIC PANEL
ALT: 24 U/L (ref 0–44)
AST: 20 U/L (ref 15–41)
Albumin: 3.1 g/dL — ABNORMAL LOW (ref 3.5–5.0)
Alkaline Phosphatase: 45 U/L (ref 38–126)
Anion gap: 9 (ref 5–15)
BUN: 15 mg/dL (ref 8–23)
CO2: 21 mmol/L — ABNORMAL LOW (ref 22–32)
Calcium: 8.1 mg/dL — ABNORMAL LOW (ref 8.9–10.3)
Chloride: 106 mmol/L (ref 98–111)
Creatinine, Ser: 1.07 mg/dL (ref 0.61–1.24)
GFR, Estimated: >60 mL/min (ref >60)
Glucose, Bld: 87 mg/dL (ref 70–99)
Potassium: 4 mmol/L (ref 3.5–5.1)
Sodium: 136 mmol/L (ref 135–145)
Total Bilirubin: 1.1 mg/dL (ref 0.3–1.2)
Total Protein: 6.2 g/dL — ABNORMAL LOW (ref 6.5–8.1)

## 2021-01-28 SURGERY — LAPAROSCOPIC CHOLECYSTECTOMY SINGLE SITE WITH INTRAOPERATIVE CHOLANGIOGRAM
Anesthesia: General

## 2021-01-28 MED ORDER — DEXAMETHASONE SODIUM PHOSPHATE 10 MG/ML IJ SOLN
INTRAMUSCULAR | Status: AC
  Filled 2021-01-28: qty 2

## 2021-01-28 MED ORDER — SUGAMMADEX SODIUM 200 MG/2ML IV SOLN
INTRAVENOUS | Status: DC | PRN
  Administered 2021-01-28: 200 mg via INTRAVENOUS

## 2021-01-28 MED ORDER — LIDOCAINE HCL (PF) 2 % IJ SOLN
INTRAMUSCULAR | Status: AC
  Filled 2021-01-28: qty 10

## 2021-01-28 MED ORDER — FENTANYL CITRATE (PF) 100 MCG/2ML IJ SOLN
INTRAMUSCULAR | Status: AC
  Filled 2021-01-28: qty 2

## 2021-01-28 MED ORDER — FENTANYL CITRATE PF 50 MCG/ML IJ SOSY
PREFILLED_SYRINGE | INTRAMUSCULAR | Status: AC
  Filled 2021-01-28: qty 2

## 2021-01-28 MED ORDER — CALCIUM POLYCARBOPHIL 625 MG PO TABS
625.0000 mg | ORAL_TABLET | Freq: Two times a day (BID) | ORAL | Status: DC
  Administered 2021-01-28 – 2021-01-29 (×3): 625 mg via ORAL
  Filled 2021-01-28 (×6): qty 1

## 2021-01-28 MED ORDER — LACTATED RINGERS IR SOLN
Status: DC | PRN
  Administered 2021-01-28: 1000 mL

## 2021-01-28 MED ORDER — SODIUM CHLORIDE 0.9% FLUSH
3.0000 mL | Freq: Two times a day (BID) | INTRAVENOUS | Status: DC
  Administered 2021-01-28 – 2021-01-29 (×4): 3 mL via INTRAVENOUS

## 2021-01-28 MED ORDER — PROCHLORPERAZINE EDISYLATE 10 MG/2ML IJ SOLN: 5.0000 mg | INTRAMUSCULAR | Status: DC | PRN

## 2021-01-28 MED ORDER — 0.9 % SODIUM CHLORIDE (POUR BTL) OPTIME
TOPICAL | Status: DC | PRN
  Administered 2021-01-28: 1000 mL

## 2021-01-28 MED ORDER — METHOCARBAMOL 1000 MG/10ML IJ SOLN
1000.0000 mg | Freq: Four times a day (QID) | INTRAVENOUS | Status: DC | PRN
  Filled 2021-01-28: qty 10

## 2021-01-28 MED ORDER — METRONIDAZOLE 500 MG/100ML IV SOLN
500.0000 mg | Freq: Once | INTRAVENOUS | Status: AC
  Administered 2021-01-28: 500 mg via INTRAVENOUS
  Filled 2021-01-28: qty 100

## 2021-01-28 MED ORDER — MAGIC MOUTHWASH
15.0000 mL | Freq: Four times a day (QID) | ORAL | Status: DC | PRN
  Filled 2021-01-28: qty 15

## 2021-01-28 MED ORDER — ONDANSETRON HCL 4 MG/2ML IJ SOLN
INTRAMUSCULAR | Status: DC | PRN
  Administered 2021-01-28: 4 mg via INTRAVENOUS

## 2021-01-28 MED ORDER — OXYCODONE-ACETAMINOPHEN 5-325 MG PO TABS
1.0000 | ORAL_TABLET | Freq: Four times a day (QID) | ORAL | Status: DC | PRN
  Administered 2021-01-28 – 2021-01-30 (×5): 1 via ORAL
  Filled 2021-01-28 (×5): qty 1

## 2021-01-28 MED ORDER — LIP MEDEX EX OINT
1.0000 "application " | TOPICAL_OINTMENT | Freq: Two times a day (BID) | CUTANEOUS | Status: DC
  Administered 2021-01-28 – 2021-01-29 (×3): 1 via TOPICAL
  Filled 2021-01-28 (×2): qty 7

## 2021-01-28 MED ORDER — PROPOFOL 10 MG/ML IV BOLUS
INTRAVENOUS | Status: DC | PRN
  Administered 2021-01-28: 110 mg via INTRAVENOUS
  Administered 2021-01-28: 50 mg via INTRAVENOUS

## 2021-01-28 MED ORDER — ONDANSETRON HCL 4 MG/2ML IJ SOLN: 4.0000 mg | Freq: Four times a day (QID) | INTRAMUSCULAR | Status: DC | PRN

## 2021-01-28 MED ORDER — TRAMADOL HCL 50 MG PO TABS
50.0000 mg | ORAL_TABLET | Freq: Four times a day (QID) | ORAL | Status: DC | PRN
Start: 2021-01-28 — End: 2021-01-30
  Administered 2021-01-28 – 2021-01-29 (×2): 100 mg via ORAL
  Filled 2021-01-28: qty 2
  Filled 2021-01-28 (×2): qty 1

## 2021-01-28 MED ORDER — HYDRALAZINE HCL 20 MG/ML IJ SOLN: 10.0000 mg | Freq: Three times a day (TID) | INTRAMUSCULAR | Status: DC | PRN

## 2021-01-28 MED ORDER — SODIUM CHLORIDE 0.9 % IV SOLN
8.0000 mg | Freq: Four times a day (QID) | INTRAVENOUS | Status: DC | PRN
  Filled 2021-01-28: qty 4

## 2021-01-28 MED ORDER — EPHEDRINE SULFATE-NACL 50-0.9 MG/10ML-% IV SOSY
PREFILLED_SYRINGE | INTRAVENOUS | Status: DC | PRN
  Administered 2021-01-28 (×2): 5 mg via INTRAVENOUS

## 2021-01-28 MED ORDER — EPHEDRINE 5 MG/ML INJ
INTRAVENOUS | Status: AC
  Filled 2021-01-28: qty 5

## 2021-01-28 MED ORDER — SIMETHICONE 40 MG/0.6ML PO SUSP
80.0000 mg | Freq: Four times a day (QID) | ORAL | Status: DC | PRN
  Filled 2021-01-28: qty 1.2

## 2021-01-28 MED ORDER — STERILE WATER FOR IRRIGATION IR SOLN
Status: DC | PRN
  Administered 2021-01-28: 1000 mL

## 2021-01-28 MED ORDER — ROCURONIUM BROMIDE 10 MG/ML (PF) SYRINGE
PREFILLED_SYRINGE | INTRAVENOUS | Status: AC
  Filled 2021-01-28: qty 10

## 2021-01-28 MED ORDER — PIPERACILLIN-TAZOBACTAM 3.375 G IVPB
3.3750 g | Freq: Three times a day (TID) | INTRAVENOUS | Status: DC
  Filled 2021-01-28: qty 50

## 2021-01-28 MED ORDER — ROCURONIUM BROMIDE 10 MG/ML (PF) SYRINGE
PREFILLED_SYRINGE | INTRAVENOUS | Status: DC | PRN
Start: 2021-01-28 — End: 2021-01-28
  Administered 2021-01-28: 60 mg via INTRAVENOUS

## 2021-01-28 MED ORDER — OXYCODONE HCL 5 MG PO TABS: 5.0000 mg | ORAL_TABLET | Freq: Once | ORAL | Status: DC | PRN

## 2021-01-28 MED ORDER — BUPIVACAINE-EPINEPHRINE 0.25% -1:200000 IJ SOLN
INTRAMUSCULAR | Status: DC | PRN
  Administered 2021-01-28: 30 mL

## 2021-01-28 MED ORDER — BISACODYL 10 MG RE SUPP: 10.0000 mg | Freq: Two times a day (BID) | RECTAL | Status: DC | PRN

## 2021-01-28 MED ORDER — ONDANSETRON HCL 4 MG/2ML IJ SOLN
INTRAMUSCULAR | Status: AC
  Filled 2021-01-28: qty 4

## 2021-01-28 MED ORDER — BUPIVACAINE-EPINEPHRINE 0.25% -1:200000 IJ SOLN
INTRAMUSCULAR | Status: AC
  Filled 2021-01-28: qty 1

## 2021-01-28 MED ORDER — SODIUM CHLORIDE 0.9% FLUSH: 3.0000 mL | INTRAVENOUS | Status: DC | PRN

## 2021-01-28 MED ORDER — LACTATED RINGERS IV BOLUS: 1000.0000 mL | Freq: Three times a day (TID) | INTRAVENOUS | Status: DC | PRN

## 2021-01-28 MED ORDER — ESMOLOL HCL 100 MG/10ML IV SOLN
INTRAVENOUS | Status: AC
  Filled 2021-01-28: qty 10

## 2021-01-28 MED ORDER — BUPIVACAINE LIPOSOME 1.3 % IJ SUSP
INTRAMUSCULAR | Status: DC | PRN
  Administered 2021-01-28: 20 mL

## 2021-01-28 MED ORDER — METHOCARBAMOL 500 MG PO TABS: 1000.0000 mg | ORAL_TABLET | Freq: Four times a day (QID) | ORAL | Status: DC | PRN

## 2021-01-28 MED ORDER — ONDANSETRON HCL 4 MG/2ML IJ SOLN: 4.0000 mg | Freq: Once | INTRAMUSCULAR | Status: DC | PRN

## 2021-01-28 MED ORDER — SODIUM CHLORIDE 0.9 % IV SOLN: 250.0000 mL | INTRAVENOUS | Status: DC | PRN

## 2021-01-28 MED ORDER — APREPITANT 40 MG PO CAPS
40.0000 mg | ORAL_CAPSULE | Freq: Once | ORAL | Status: AC
  Administered 2021-01-28: 40 mg via ORAL
  Filled 2021-01-28: qty 1

## 2021-01-28 MED ORDER — OXYCODONE HCL 5 MG/5ML PO SOLN: 5.0000 mg | Freq: Once | ORAL | Status: DC | PRN

## 2021-01-28 MED ORDER — SODIUM CHLORIDE 0.9 % IR SOLN
Status: DC | PRN
  Administered 2021-01-28: 3000 mL

## 2021-01-28 MED ORDER — FENTANYL CITRATE PF 50 MCG/ML IJ SOSY
25.0000 ug | PREFILLED_SYRINGE | INTRAMUSCULAR | Status: DC | PRN
Start: 2021-01-28 — End: 2021-01-28
  Administered 2021-01-28 (×2): 50 ug via INTRAVENOUS

## 2021-01-28 MED ORDER — CHLORHEXIDINE GLUCONATE 0.12 % MT SOLN
15.0000 mL | OROMUCOSAL | Status: AC
  Administered 2021-01-28: 15 mL via OROMUCOSAL

## 2021-01-28 MED ORDER — DEXAMETHASONE SODIUM PHOSPHATE 10 MG/ML IJ SOLN
INTRAMUSCULAR | Status: DC | PRN
  Administered 2021-01-28: 4 mg via INTRAVENOUS

## 2021-01-28 MED ORDER — ESMOLOL HCL 100 MG/10ML IV SOLN
INTRAVENOUS | Status: DC | PRN
  Administered 2021-01-28 (×6): 10 mg via INTRAVENOUS

## 2021-01-28 MED ORDER — PIPERACILLIN-TAZOBACTAM 3.375 G IVPB
3.3750 g | Freq: Three times a day (TID) | INTRAVENOUS | Status: DC
  Administered 2021-01-28 – 2021-01-30 (×6): 3.375 g via INTRAVENOUS
  Filled 2021-01-28 (×9): qty 50

## 2021-01-28 MED ORDER — DEXTROSE 5 % IV SOLN
INTRAVENOUS | Status: DC | PRN
  Administered 2021-01-28: 2 g via INTRAVENOUS

## 2021-01-28 MED ORDER — ACETAMINOPHEN 325 MG PO TABS
650.0000 mg | ORAL_TABLET | Freq: Once | ORAL | Status: AC
  Administered 2021-01-28: 650 mg via ORAL
  Filled 2021-01-28: qty 2

## 2021-01-28 MED ORDER — HYDROMORPHONE HCL 1 MG/ML IJ SOLN
0.5000 mg | INTRAMUSCULAR | Status: DC | PRN
Start: 2021-01-28 — End: 2021-01-30
  Administered 2021-01-28 – 2021-01-30 (×3): 1 mg via INTRAVENOUS
  Filled 2021-01-28 (×3): qty 1

## 2021-01-28 MED ORDER — FENTANYL CITRATE (PF) 100 MCG/2ML IJ SOLN
INTRAMUSCULAR | Status: DC | PRN
  Administered 2021-01-28: 25 ug via INTRAVENOUS
  Administered 2021-01-28 (×2): 50 ug via INTRAVENOUS
  Administered 2021-01-28: 25 ug via INTRAVENOUS
  Administered 2021-01-28: 50 ug via INTRAVENOUS

## 2021-01-28 MED ORDER — BUPIVACAINE LIPOSOME 1.3 % IJ SUSP
INTRAMUSCULAR | Status: AC
  Filled 2021-01-28: qty 20

## 2021-01-28 MED ORDER — LIDOCAINE 2% (20 MG/ML) 5 ML SYRINGE
INTRAMUSCULAR | Status: DC | PRN
  Administered 2021-01-28: 60 mg via INTRAVENOUS

## 2021-01-28 MED ORDER — DIPHENHYDRAMINE HCL 50 MG/ML IJ SOLN
12.5000 mg | Freq: Four times a day (QID) | INTRAMUSCULAR | Status: DC | PRN
  Administered 2021-01-29: 12.5 mg via INTRAVENOUS
  Filled 2021-01-28: qty 1

## 2021-01-28 SURGICAL SUPPLY — 50 items
APL PRP STRL LF DISP 70% ISPRP (MISCELLANEOUS) ×1
APPLIER CLIP 5 13 M/L LIGAMAX5 (MISCELLANEOUS) ×2
APR CLP MED LRG 5 ANG JAW (MISCELLANEOUS) ×1
BAG COUNTER SPONGE SURGICOUNT (BAG) IMPLANT
BAG SPEC RTRVL 10 TROC 200 (ENDOMECHANICALS) ×1
BAG SPNG CNTER NS LX DISP (BAG)
CABLE HIGH FREQUENCY MONO STRZ (ELECTRODE) ×2 IMPLANT
CHLORAPREP W/TINT 26 (MISCELLANEOUS) ×2 IMPLANT
CLIP APPLIE 5 13 M/L LIGAMAX5 (MISCELLANEOUS) ×1 IMPLANT
COVER MAYO STAND STRL (DRAPES) ×2 IMPLANT
COVER SURGICAL LIGHT HANDLE (MISCELLANEOUS) ×2 IMPLANT
DECANTER SPIKE VIAL GLASS SM (MISCELLANEOUS) ×2 IMPLANT
DRAIN CHANNEL 19F RND (DRAIN) ×1 IMPLANT
DRAPE C-ARM 42X120 X-RAY (DRAPES) ×2 IMPLANT
DRAPE WARM FLUID 44X44 (DRAPES) ×2 IMPLANT
DRSG TEGADERM 2-3/8X2-3/4 SM (GAUZE/BANDAGES/DRESSINGS) ×1 IMPLANT
DRSG TEGADERM 4X4.75 (GAUZE/BANDAGES/DRESSINGS) ×2 IMPLANT
ELECT REM PT RETURN 15FT ADLT (MISCELLANEOUS) ×2 IMPLANT
ENDOLOOP SUT PDS II  0 18 (SUTURE) ×4
ENDOLOOP SUT PDS II 0 18 (SUTURE) IMPLANT
EVACUATOR SILICONE 100CC (DRAIN) ×1 IMPLANT
GAUZE SPONGE 2X2 8PLY STRL LF (GAUZE/BANDAGES/DRESSINGS) ×1 IMPLANT
GLOVE SURG NEOPR MICRO LF SZ8 (GLOVE) ×2 IMPLANT
GLOVE SURG UNDER LTX SZ8 (GLOVE) ×2 IMPLANT
GOWN STRL REUS W/TWL XL LVL3 (GOWN DISPOSABLE) ×4 IMPLANT
IRRIG SUCT STRYKERFLOW 2 WTIP (MISCELLANEOUS) ×2
IRRIGATION SUCT STRKRFLW 2 WTP (MISCELLANEOUS) ×1 IMPLANT
KIT BASIN OR (CUSTOM PROCEDURE TRAY) ×2 IMPLANT
KIT TURNOVER KIT A (KITS) IMPLANT
PAD POSITIONING PINK XL (MISCELLANEOUS) ×2 IMPLANT
PENCIL SMOKE EVACUATOR (MISCELLANEOUS) IMPLANT
POUCH RETRIEVAL ECOSAC 10 (ENDOMECHANICALS) IMPLANT
POUCH RETRIEVAL ECOSAC 10MM (ENDOMECHANICALS) ×2
PROTECTOR NERVE ULNAR (MISCELLANEOUS) IMPLANT
SCISSORS LAP 5X35 DISP (ENDOMECHANICALS) ×2 IMPLANT
SET CHOLANGIOGRAPH MIX (MISCELLANEOUS) ×2 IMPLANT
SET TUBE SMOKE EVAC HIGH FLOW (TUBING) ×2 IMPLANT
SHEARS HARMONIC ACE PLUS 36CM (ENDOMECHANICALS) ×2 IMPLANT
SLEEVE XCEL OPT CAN 5 100 (ENDOMECHANICALS) ×1 IMPLANT
SPONGE GAUZE 2X2 STER 10/PKG (GAUZE/BANDAGES/DRESSINGS) ×1
SUT MNCRL AB 4-0 PS2 18 (SUTURE) ×2 IMPLANT
SUT PDS AB 1 CT1 27 (SUTURE) ×4 IMPLANT
SUT PROLENE 2 0 SH DA (SUTURE) ×1 IMPLANT
SUT VLOC 180 3-0 18IN (SUTURE) ×1 IMPLANT
SYR 20ML LL LF (SYRINGE) ×2 IMPLANT
TOWEL OR 17X26 10 PK STRL BLUE (TOWEL DISPOSABLE) ×2 IMPLANT
TOWEL OR NON WOVEN STRL DISP B (DISPOSABLE) ×2 IMPLANT
TRAY LAPAROSCOPIC (CUSTOM PROCEDURE TRAY) ×2 IMPLANT
TROCAR BLADELESS OPT 5 100 (ENDOMECHANICALS) ×2 IMPLANT
TROCAR BLADELESS OPT 5 150 (ENDOMECHANICALS) ×2 IMPLANT

## 2021-01-28 NOTE — Anesthesia Postprocedure Evaluation (Signed)
Anesthesia Post Note  Patient: Timothy Khan  Procedure(s) Performed: LAPAROSCOPIC CHOLECYSTECTOMY FOR EMPYEMA  WITH REPAIR OF CYSTIC DUCT     Patient location during evaluation: PACU Anesthesia Type: General Level of consciousness: awake and alert Pain management: pain level controlled Vital Signs Assessment: post-procedure vital signs reviewed and stable Respiratory status: spontaneous breathing, nonlabored ventilation, respiratory function stable and patient connected to nasal cannula oxygen Cardiovascular status: blood pressure returned to baseline, stable and tachycardic Postop Assessment: no apparent nausea or vomiting Anesthetic complications: no   No notable events documented.  Last Vitals:  Vitals:   01/28/21 1015 01/28/21 1030  BP: (!) 163/67 (!) 162/69  Pulse: (!) 105 (!) 104  Resp: (!) 9 13  Temp:    SpO2: 91% 94%    Last Pain:  Vitals:   01/28/21 1030  TempSrc:   PainSc: [employer]

## 2021-01-28 NOTE — Anesthesia Preprocedure Evaluation (Addendum)
Anesthesia Evaluation  Patient identified by MRN, date of birth, ID band Patient awake    Reviewed: Allergy & Precautions, NPO status , Patient's Chart, lab work & pertinent test results  History of Anesthesia Complications Negative for: history of anesthetic complications  Airway Mallampati: II  TM Distance: >3 FB Neck ROM: Limited    Dental  (+) Dental Advisory Given, Missing, Edentulous Upper   Pulmonary sleep apnea and Continuous Positive Airway Pressure Ventilation , former smoker,    Pulmonary exam normal        Cardiovascular hypertension, Pt. on medications Normal cardiovascular exam+ dysrhythmias      Neuro/Psych  Hx skull fracture Foot drop b/l Peripheral neuropathy  negative psych ROS   GI/Hepatic Neg liver ROS, hiatal hernia, GERD  Medicated and Controlled,  Endo/Other  negative endocrine ROS  Renal/GU negative Renal ROS     Musculoskeletal negative musculoskeletal ROS (+)   Abdominal   Peds  Hematology negative hematology ROS (+)   Anesthesia Other Findings   Reproductive/Obstetrics                            Anesthesia Physical Anesthesia Plan  ASA: 3  Anesthesia Plan: General   Post-op Pain Management:    Induction: Intravenous  PONV Risk Score and Plan: 4 or greater and Treatment may vary due to age or medical condition, Ondansetron and Propofol infusion  Airway Management Planned: Oral ETT  Additional Equipment: None  Intra-op Plan:   Post-operative Plan: Extubation in OR  Informed Consent: I have reviewed the patients History and Physical, chart, labs and discussed the procedure including the risks, benefits and alternatives for the proposed anesthesia with the patient or authorized representative who has indicated his/her understanding and acceptance.     Dental advisory given  Plan Discussed with: CRNA and Anesthesiologist  Anesthesia Plan  Comments:        Anesthesia Quick Evaluation

## 2021-01-28 NOTE — Transfer of Care (Signed)
Immediate Anesthesia Transfer of Care Note  Patient: Timothy Khan  Procedure(s) Performed: Procedure(s): LAPAROSCOPIC CHOLECYSTECTOMY FOR EMPYEMA  WITH REPAIR OF CYSTIC DUCT (N/A)  Patient Location: PACU  Anesthesia Type:General  Level of Consciousness:  sedated, patient cooperative and responds to stimulation  Airway & Oxygen Therapy:Patient Spontanous Breathing and Patient connected to face mask oxgen  Post-op Assessment:  Report given to PACU RN and Post -op Vital signs reviewed and stable  Post vital signs:  Reviewed and stable  Last Vitals:  Vitals:   01/28/21 0639 01/28/21 0925  BP: 140/73 (!) 164/66  Pulse: 71 (!) 101  Resp: 18   Temp: 36.9 C   SpO2: 07%     Complications: No apparent anesthesia complications

## 2021-01-28 NOTE — Progress Notes (Signed)
PROGRESS NOTE  Timothy Khan JTT:[id number] DOB: 04-18-1931 DOA: 01/25/2021 PCP: [name], MD  HPI/Recap of past 24 hours: Timothy Khan is a 87 year old male with past medical history of idiopathic pancreatitis, hyperlipidemia, hypertension, peripheral neuropathy, subdural hematoma, stage III CKD who presented to the ED with epigastric abdominal pain associated with 4 episodes of emesis, felt similar to when he had his pancreatitis episodes. No other new complaints. His caregiver stated that he ate a substantial amount of pistachios in the past few days. In the ED, noted to be tachycardic, otherwise stable, labs showed leukocytosis, LA minimally elevated, lipase WNL otherwise stable.  Chest x-ray unremarkable.  CT abdomen/pelvis without any acute intracranial abdominal process, no evidence of acute pancreatitis, noted cholelithiasis.  Patient admitted for further management.    Today, saw pt post op, reported significant post op abdominal pain. No other complaints. Son at bedside.      Assessment/Plan: Principal Problem:   SIRS (systemic inflammatory response syndrome) (HCC) Active Problems:   Hyperlipidemia   Hypertension   BPH (benign prostatic hyperplasia)   GERD (gastroesophageal reflux disease)   Intractable abdominal pain   Nausea and vomiting   Epigastric pain   Sepsis likely 2/2 acute on chronic calculus cholecystitis  Empyema of gallbladder  On admission, tachycardic with leukocytosis Currently afebrile, with resolved leukocytosis BC x2 NGTD CT abdomen/pelvis unremarkable, no evidence of acute pancreatitis, noted gallstones RUQ USS notable for acute cholecystitis General surgery consulted, s/p lap chole and drainage of empyema of gall bladder with drain placed on 01/28/21 Started on IV Zosyn Continue gentle IV fluids Further post op care per surgery  GERD Continue IV PPI  Hypertension Continue amlodipine, IV hydralazine prn  Chronic  diastolic HF Appears euvolemic Echo shows EF of 65 to 00%, grade 1 diastolic dysfunction  BPH Continue Flomax at bedtime      Estimated body mass index is 24.39 kg/m as calculated from the following:   Height as of this encounter: 5\' 10"  (1.778 m).   Weight as of this encounter: 77.1 kg.     Code Status: Full  Family Communication: Son at bedside  Disposition Plan: Status is: Inpatient  Remains inpatient appropriate because: Level of care     Consultants: General surgery   Procedures: Lap chole on 11/11  Antimicrobials: Zosyn  DVT prophylaxis: Lovenox   Objective: Vitals:   01/28/21 1000 01/28/21 1015 01/28/21 1030 01/28/21 1104  BP: (!) 167/77 (!) 163/67 (!) 162/69 (!) 167/78  Pulse: (!) 105 (!) 105 (!) 104 (!) 105  Resp: 10 (!) 9 13 20   Temp: 98.1 F (36.7 C)   97.9 F (36.6 C)  TempSrc:    Oral  SpO2: 92% 91% 94% 95%  Weight:      Height:        Intake/Output Summary (Last 24 hours) at 01/28/2021 1655 Last data filed at 01/28/2021 1345 Gross per 24 hour  Intake 803 ml  Output 120 ml  Net 683 ml   Filed Weights   01/28/21 0639  Weight: 77.1 kg    Exam: General: NAD  Cardiovascular: S1, S2 present Respiratory: CTAB Abdomen: Soft, tender, +distended, bowel sounds present, dressing c/d/I, drain noted Musculoskeletal: Trace bilateral pedal edema noted Skin: Normal Psychiatry: Normal mood     Data Reviewed: CBC: Recent Labs  Lab 01/25/21 0631 01/26/21 0515 01/27/21 0507 01/28/21 0452  WBC 13.2* 18.5* 11.0* 8.2  NEUTROABS 11.8*  --  7.7 5.8  HGB 16.1 15.6 12.7* 12.5*  HCT 48.9  46.6 38.7* 38.9*  MCV 86.2 86.3 89.6 90.3  PLT 235 228 179 818   Basic Metabolic Panel: Recent Labs  Lab 01/25/21 0631 01/26/21 0515 01/27/21 0507 01/28/21 0452  NA 135 136 137 136  K 4.3 4.2 4.1 4.0  CL 97* 102 105 106  CO2 26 25 28  21*  GLUCOSE 175* 124* 94 87  BUN 21 17 19 15   CREATININE 1.20 1.21 1.28* 1.07  CALCIUM 8.9 8.1* 8.1* 8.1*   MG 2.2  --   --   --    GFR: Estimated Creatinine Clearance: 50.2 mL/min (by C-G formula based on SCr of 1.07 mg/dL). Liver Function Tests: Recent Labs  Lab 01/25/21 0631 01/26/21 0515 01/27/21 0507 01/28/21 0452  AST 32 34 22 20  ALT 23 34 28 24  ALKPHOS 67 53 45 45  BILITOT 1.0 1.7* 1.5* 1.1  PROT 8.8* 6.9 6.1* 6.2*  ALBUMIN 4.8 3.5 3.2* 3.1*   Recent Labs  Lab 01/25/21 0631  LIPASE 32   No results for input(s): AMMONIA in the last 168 hours. Coagulation Profile: No results for input(s): INR, PROTIME in the last 168 hours. Cardiac Enzymes: No results for input(s): CKTOTAL, CKMB, CKMBINDEX, TROPONINI in the last 168 hours. BNP (last 3 results) No results for input(s): PROBNP in the last 8760 hours. HbA1C: No results for input(s): HGBA1C in the last 72 hours. CBG: No results for input(s): GLUCAP in the last 168 hours. Lipid Profile: No results for input(s): CHOL, HDL, LDLCALC, TRIG, CHOLHDL, LDLDIRECT in the last 72 hours. Thyroid Function Tests: No results for input(s): TSH, T4TOTAL, FREET4, T3FREE, THYROIDAB in the last 72 hours. Anemia Panel: No results for input(s): VITAMINB12, FOLATE, FERRITIN, TIBC, IRON, RETICCTPCT in the last 72 hours. Urine analysis:    Component Value Date/Time   COLORURINE YELLOW 01/25/2021 0813   APPEARANCEUR CLEAR 01/25/2021 0813   LABSPEC 1.016 01/25/2021 0813   PHURINE 7.0 01/25/2021 0813   GLUCOSEU 50 (A) 01/25/2021 0813   HGBUR NEGATIVE 01/25/2021 0813   BILIRUBINUR NEGATIVE 01/25/2021 0813   KETONESUR 5 (A) 01/25/2021 0813   PROTEINUR >=300 (A) 01/25/2021 0813   NITRITE NEGATIVE 01/25/2021 0813   LEUKOCYTESUR NEGATIVE 01/25/2021 0813   Sepsis Labs: @LABRCNTIP (procalcitonin:4,lacticidven:4)  ) Recent Results (from the past 240 hour(s))  Resp Panel by RT-PCR (Flu A&B, Covid) Nasopharyngeal Swab     Status: None   Collection Time: 01/25/21 12:00 PM   Specimen: Nasopharyngeal Swab; Nasopharyngeal(NP) swabs in vial  transport medium  Result Value Ref Range Status   SARS Coronavirus 2 by RT PCR NEGATIVE NEGATIVE Final    Comment: (NOTE) SARS-CoV-2 target nucleic acids are NOT DETECTED.  The SARS-CoV-2 RNA is generally detectable in upper respiratory specimens during the acute phase of infection. The lowest concentration of SARS-CoV-2 viral copies this assay can detect is 138 copies/mL. A negative result does not preclude SARS-Cov-2 infection and should not be used as the sole basis for treatment or other patient management decisions. A negative result may occur with  improper specimen collection/handling, submission of specimen other than nasopharyngeal swab, presence of viral mutation(s) within the areas targeted by this assay, and inadequate number of viral copies(<138 copies/mL). A negative result must be combined with clinical observations, patient history, and epidemiological information. The expected result is Negative.  Fact Sheet for Patients:  EntrepreneurPulse.com.au  Fact Sheet for Healthcare Providers:  IncredibleEmployment.be  This test is no t yet approved or cleared by the [place] FDA and  has been authorized for detection  and/or diagnosis of SARS-CoV-2 by FDA under an Emergency Use Authorization (EUA). This EUA will remain  in effect (meaning this test can be used) for the duration of the COVID-19 declaration under Section 564(b)(1) of the Act, 21 U.S.C.section 360bbb-3(b)(1), unless the authorization is terminated  or revoked sooner.       Influenza A by PCR NEGATIVE NEGATIVE Final   Influenza B by PCR NEGATIVE NEGATIVE Final    Comment: (NOTE) The Xpert Xpress SARS-CoV-2/FLU/RSV plus assay is intended as an aid in the diagnosis of influenza from Nasopharyngeal swab specimens and should not be used as a sole basis for treatment. Nasal washings and aspirates are unacceptable for Xpert Xpress SARS-CoV-2/FLU/RSV testing.  Fact  Sheet for Patients: EntrepreneurPulse.com.au  Fact Sheet for Healthcare Providers: IncredibleEmployment.be  This test is not yet approved or cleared by the [place] FDA and has been authorized for detection and/or diagnosis of SARS-CoV-2 by FDA under an Emergency Use Authorization (EUA). This EUA will remain in effect (meaning this test can be used) for the duration of the COVID-19 declaration under Section 564(b)(1) of the Act, 21 U.S.C. section 360bbb-3(b)(1), unless the authorization is terminated or revoked.  Performed at [facility], [place] [address] [place], [place] [address]   Blood culture (routine x 2)     Status: None (Preliminary result)   Collection Time: 01/25/21 12:10 PM   Specimen: BLOOD  Result Value Ref Range Status   Specimen Description   Final    BLOOD RIGHT ANTECUBITAL Performed at [place] [address] [place], [place] [address]    Special Requests   Final    BOTTLES DRAWN AEROBIC AND ANAEROBIC Blood Culture adequate volume Performed at [place] [facility] [address], [place], [place] [address]    Culture   Final    NO GROWTH 3 DAYS Performed at [facility], [place] [address], [place], [place] [address]    Report Status PENDING  Incomplete  Blood culture (routine x 2)     Status: None (Preliminary result)   Collection Time: 01/25/21 12:25 PM   Specimen: BLOOD  Result Value Ref Range Status   Specimen Description   Final    BLOOD LEFT ANTECUBITAL Performed at [facility] [address], [place], [place] [address]    Special Requests   Final    BOTTLES DRAWN AEROBIC AND ANAEROBIC Blood Culture adequate volume Performed at [facility] [address] [place], [place] [address]    Culture   Final    NO GROWTH 3 DAYS Performed at [facility], [place] [address], [place], [place] [address]    Report  Status PENDING  Incomplete      Studies: DG C-Arm 1-60 Min-No Report  Result Date: 01/28/2021 Fluoroscopy was utilized by the requesting physician.  No radiographic interpretation.    Scheduled Meds:  amLODipine  5 mg Oral QHS   enoxaparin (LOVENOX) injection  40 mg Subcutaneous Q24H   fentaNYL       gabapentin  600 mg Oral QHS   lip balm  1 application Topical BID   pantoprazole (PROTONIX) IV  40 mg Intravenous Q24H   polycarbophil  625 mg Oral BID   sodium chloride flush  3 mL Intravenous Q12H   tamsulosin  0.4 mg Oral QHS    Continuous Infusions:  sodium chloride     lactated ringers     lactated ringers 75 mL/hr at 01/28/21 0725  methocarbamol (ROBAXIN) IV     ondansetron (ZOFRAN) IV     piperacillin-tazobactam (ZOSYN)  IV 3.375 g (01/28/21 1343)     LOS: 2 days     Alma Friendly, MD [facility]  If 7PM-7AM, please contact night-coverage [email or web address] 01/28/2021, 4:55 PM

## 2021-01-28 NOTE — Progress Notes (Signed)
Pt returned from PACU. Pt is A&O x4 with c/o of 10/10 pain at surgical site. MD aware.

## 2021-01-28 NOTE — Anesthesia Procedure Notes (Signed)
Procedure Name: Intubation Date/Time: 01/28/2021 7:51 AM Performed by: Lavina Hamman, CRNA Pre-anesthesia Checklist: Patient identified, Emergency Drugs available, Suction available, Patient being monitored and Timeout performed Patient Re-evaluated:Patient Re-evaluated prior to induction Oxygen Delivery Method: Circle system utilized Preoxygenation: Pre-oxygenation with 100% oxygen Induction Type: IV induction Ventilation: Mask ventilation without difficulty Laryngoscope Size: Mac and 4 Grade View: Grade I Tube type: Oral Tube size: 7.5 mm Number of attempts: 1 Airway Equipment and Method: Stylet Placement Confirmation: ETT inserted through vocal cords under direct vision, positive ETCO2, CO2 detector and breath sounds checked- equal and bilateral Secured at: 22 cm Tube secured with: Tape Dental Injury: Teeth and Oropharynx as per pre-operative assessment  Comments: ATOI

## 2021-01-28 NOTE — Interval H&P Note (Signed)
History and Physical Interval Note:  01/28/2021 6:55 AM  Timothy Khan  has presented today for surgery, with the diagnosis of ACUTE CHOLECYSTITIS.  The various methods of treatment have been discussed with the patient and family. After consideration of risks, benefits and other options for treatment, the patient has consented to  Procedure(s): [place] CHOLANGIOGRAM (N/A) as a surgical intervention.  The patient's history has been reviewed, patient examined, no change in status, stable for surgery.  I have reviewed the patient's chart and labs.  Questions were answered to the patient's satisfaction.    I have re-reviewed the the patient's records, history, medications, and allergies.  I have re-examined the patient.  I again discussed intraoperative plans and goals of post-operative recovery.  The patient agrees to proceed.  Timothy Khan  04/18/1931 [id number]  Patient Care Team: [name], MD as PCP - General (Internal Medicine)  Patient Active Problem List   Diagnosis Date Noted   Epigastric pain 01/26/2021   SIRS (systemic inflammatory response syndrome) ([place]) 01/25/2021   Intractable abdominal pain 01/25/2021   Nausea and vomiting 01/25/2021   OSA (obstructive sleep apnea) 07/25/2020   Acute-on-chronic kidney injury ([place]) 07/25/2020   Acute pancreatitis 06/06/2018   BPH (benign prostatic hyperplasia) 06/06/2018   LBBB (left bundle branch block) 06/06/2018   GERD (gastroesophageal reflux disease) 06/06/2018   Esophageal thickening 06/06/2018   Hiatal hernia 06/06/2018   Foot drop, bilateral 05/02/2013   SDH (subdural hematoma) 04/30/2013   Polyneuropathy 04/30/2013   Skull fracture ([place]) 04/30/2013   Thoracic or lumbosacral neuritis or radiculitis, unspecified 03/18/2012   Hereditary and idiopathic peripheral neuropathy 03/18/2012   Abnormality of gait 03/18/2012   Concussion with no loss of consciousness  03/18/2012   Hyperlipidemia 03/18/2012   Hypertension 03/18/2012    Past Medical History:  Diagnosis Date   Abnormal brain MRI    Right side   Basal cell carcinoma of back    BPH (benign prostatic hyperplasia)    Concussion 1950   Baseball injury   Foot drop, bilateral 05/02/2013   Gait disturbance    Gastroesophageal reflux disease    Hyperlipidemia    Hypertension    Lumbago    Peripheral neuropathy    Skull fracture ([place])    Sleep apnea     Past Surgical History:  Procedure Laterality Date   BIOPSY  03/01/2020   Procedure: BIOPSY;  Surgeon: Ronnette Juniper, MD;  Location: [facility];  Service: Gastroenterology;;   CATARACT EXTRACTION Bilateral    ESOPHAGEAL MANOMETRY N/A 01/05/2020   Procedure: ESOPHAGEAL MANOMETRY (EM);  Surgeon: Ronnette Juniper, MD;  Location: [facility];  Service: Gastroenterology;  Laterality: N/A;   ESOPHAGOGASTRODUODENOSCOPY (EGD) WITH PROPOFOL N/A 06/11/2018   Procedure: ESOPHAGOGASTRODUODENOSCOPY (EGD) WITH PROPOFOL;  Surgeon: Clarene Essex, MD;  Location: [facility];  Service: Endoscopy;  Laterality: N/A;   ESOPHAGOGASTRODUODENOSCOPY (EGD) WITH PROPOFOL N/A 03/01/2020   Procedure: ESOPHAGOGASTRODUODENOSCOPY (EGD) WITH PROPOFOL With Botox injections;  Surgeon: Ronnette Juniper, MD;  Location: [facility];  Service: Gastroenterology;  Laterality: N/A;   KNEE SURGERY Left    skin cancer resection     basal cell, Axis carcinoma   SUBMUCOSAL INJECTION  03/01/2020   Procedure: SUBMUCOSAL INJECTION;  Surgeon: Ronnette Juniper, MD;  Location: [facility];  Service: Gastroenterology;;   TONSILLECTOMY     VASECTOMY      Social History   Socioeconomic History   Marital status: Married    Spouse name: Not on file   Number  of children: 4   Years of education: 34   Highest education level: Not on file  Occupational History   Occupation: Retired  Tobacco Use   Smoking status: Former    Types: Cigarettes    Quit date: 08/18/1984    Years since quitting: 36.4    Smokeless tobacco: Never  Vaping Use   Vaping Use: Never used  Substance and Sexual Activity   Alcohol use: No   Drug use: No   Sexual activity: Not on file  Other Topics Concern   Not on file  Social History Narrative   Patient states he is ambidextrous but is predominantly left handed.   Patient drinks 3 cups of coffee per day.   Social Determinants of Health   Financial Resource Strain: Not on file  Food Insecurity: Not on file  Transportation Needs: Not on file  Physical Activity: Not on file  Stress: Not on file  Social Connections: Not on file  Intimate Partner Violence: Not on file    Family History  Problem Relation Age of Onset   Cancer Mother        Breast cancer   Heart attack Father    Heart disease Brother     Medications Prior to Admission  Medication Sig Dispense Refill Last Dose   albuterol (PROVENTIL HFA;VENTOLIN HFA) 108 (90 Base) MCG/ACT inhaler Inhale 2 puffs into the lungs every 6 (six) hours as needed for wheezing or shortness of breath. 1 Inhaler 2 unk   amLODipine (NORVASC) 5 MG tablet Take 5 mg by mouth at bedtime.   01/24/2021   cetirizine (ZYRTEC) 10 MG tablet Take 10 mg by mouth at bedtime.    01/24/2021   Cholecalciferol (VITAMIN D3) 2000 UNITS capsule Take 2,000 Units by mouth every other day.    01/24/2021   Coenzyme Q10 (CO Q-10) 300 MG CAPS Take 300 mg by mouth daily.   01/24/2021   Cyanocobalamin (B-12) 5000 MCG CAPS Take 5,000 mcg by mouth daily.   01/24/2021   esomeprazole (NEXIUM) 20 MG capsule Take 20 mg by mouth daily.    01/24/2021   fluocinonide (LIDEX) 0.05 % external solution Apply 1 application topically daily. 20 drops or 1 ml on the scalp   01/24/2021   fluticasone (FLONASE) 50 MCG/ACT nasal spray Place 2 sprays into both nostrils at bedtime.    01/24/2021   gabapentin (NEURONTIN) 300 MG capsule Take 2 capsules (600 mg total) by mouth at bedtime. 180 capsule 3 01/24/2021   ondansetron (ZOFRAN) 4 MG tablet Take 4 mg by mouth 3 (three)  times daily as needed for nausea or vomiting.   unk   saccharomyces boulardii (FLORASTOR) 250 MG capsule Take 250 mg by mouth 2 (two) times daily.   01/24/2021   tamsulosin (FLOMAX) 0.4 MG CAPS Take 0.4 mg by mouth at bedtime.    01/24/2021   traMADol (ULTRAM) 50 MG tablet Take 2 tablets (100 mg total) by mouth daily. (Patient taking differently: Take 100 mg by mouth at bedtime.) 30 tablet 0 01/24/2021    Current Facility-Administered Medications  Medication Dose Route Frequency Provider Last Rate Last Admin   [MAR Hold] acetaminophen (TYLENOL) tablet 650 mg  650 mg Oral Q6H PRN Reubin Milan, MD   650 mg at 01/25/21 2218   Or   [MAR Hold] acetaminophen (TYLENOL) suppository 650 mg  650 mg Rectal Q6H PRN Reubin Milan, MD       [facility] Hold] albuterol (PROVENTIL) (2.5 MG/3ML) 0.083% nebulizer solution 2.5  mg  2.5 mg Nebulization Q6H PRN Dimple Nanas, [facility]       [MAR Hold] amLODipine (NORVASC) tablet 5 mg  5 mg Oral QHS Reubin Milan, MD   5 mg at 01/27/21 2104   [MAR Hold] enoxaparin (LOVENOX) injection 40 mg  40 mg Subcutaneous Q24H Alma Friendly, MD   40 mg at 01/26/21 1836   [MAR Hold] gabapentin (NEURONTIN) capsule 600 mg  600 mg Oral QHS Reubin Milan, MD   600 mg at 01/27/21 2104   lactated ringers infusion   Intravenous Continuous Alma Friendly, MD 75 mL/hr at 01/28/21 0642 New Bag at 01/28/21 0642   [MAR Hold] ondansetron (ZOFRAN) tablet 4 mg  4 mg Oral Q6H PRN Reubin Milan, MD       Or   [facility] Hold] ondansetron [facility]) injection 4 mg  4 mg Intravenous Q6H PRN Reubin Milan, MD       [facility] Hold] pantoprazole (PROTONIX) injection 40 mg  40 mg Intravenous Q24H Reubin Milan, MD   40 mg at 01/27/21 1621   [MAR Hold] tamsulosin (FLOMAX) capsule 0.4 mg  0.4 mg Oral QHS Reubin Milan, MD   0.4 mg at 01/27/21 2105     Allergies  Allergen Reactions   Doxazosin Mesylate     Other reaction(s): fatigue   Lisinopril     Other  reaction(s): cough   Macrolides And Ketolides     Other reaction(s): rash   Codeine Nausea Only    BP 140/73   Pulse 71   Temp 98.4 F (36.9 C) (Oral)   Resp 18   Ht 5\' 10"  (1.778 m)   Wt 77.1 kg   SpO2 95%   BMI 24.39 kg/m   Labs: Results for orders placed or performed during the hospital encounter of 01/25/21 (from the past 48 hour(s))  Brain natriuretic peptide     Status: None   Collection Time: 01/26/21  9:34 AM  Result Value Ref Range   B Natriuretic Peptide 97.9 0.0 - 100.0 pg/mL    Comment: Performed at [facility], [place] [address], [place], [place] [address]  Comprehensive metabolic panel     Status: Abnormal   Collection Time: 01/27/21  5:07 AM  Result Value Ref Range   Sodium 137 135 - 145 mmol/L   Potassium 4.1 3.5 - 5.1 mmol/L   Chloride 105 98 - 111 mmol/L   CO2 28 22 - 32 mmol/L   Glucose, Bld 94 70 - 99 mg/dL    Comment: Glucose reference range applies only to samples taken after fasting for at least 8 hours.   BUN 19 8 - 23 mg/dL   Creatinine, Ser 1.28 (H) 0.61 - 1.24 mg/dL   Calcium 8.1 (L) 8.9 - 10.3 mg/dL   Total Protein 6.1 (L) 6.5 - 8.1 g/dL   Albumin 3.2 (L) 3.5 - 5.0 g/dL   AST 22 15 - 41 U/L   ALT 28 0 - 44 U/L   Alkaline Phosphatase 45 38 - 126 U/L   Total Bilirubin 1.5 (H) 0.3 - 1.2 mg/dL   GFR, Estimated 54 (L) >60 mL/min    Comment: (NOTE) Calculated using the CKD-EPI Creatinine Equation (2021)    Anion gap 4 (L) 5 - 15    Comment: Performed at [facility], [place] [address] [place],  [address]  CBC with Differential/Platelet     Status: Abnormal   Collection Time: 01/27/21  5:07 AM  Result Value Ref Range   WBC 11.0 (H) 4.0 - 10.5 K/uL   RBC 4.32 4.22 - 5.81 MIL/uL   Hemoglobin 12.7 (L) 13.0 - 17.0 g/dL   HCT 38.7 (L) 39.0 - 52.0 %   MCV 89.6 80.0 - 100.0 fL   MCH 29.4 26.0 - 34.0 pg   MCHC 32.8 30.0 - 36.0 g/dL   RDW 13.3 11.5 - 15.5 %   Platelets 179 150 - 400 K/uL   nRBC 0.0 0.0 -  0.2 %   Neutrophils Relative % 69 %   Neutro Abs 7.7 1.7 - 7.7 K/uL   Lymphocytes Relative 19 %   Lymphs Abs 2.1 0.7 - 4.0 K/uL   Monocytes Relative 9 %   Monocytes Absolute 1.0 0.1 - 1.0 K/uL   Eosinophils Relative 2 %   Eosinophils Absolute 0.2 0.0 - 0.5 K/uL   Basophils Relative 0 %   Basophils Absolute 0.0 0.0 - 0.1 K/uL   Immature Granulocytes 1 %   Abs Immature Granulocytes 0.07 0.00 - 0.07 K/uL    Comment: Performed at [facility], [place] [address], [place], [place] [address]  Comprehensive metabolic panel     Status: Abnormal   Collection Time: 01/28/21  4:52 AM  Result Value Ref Range   Sodium 136 135 - 145 mmol/L   Potassium 4.0 3.5 - 5.1 mmol/L   Chloride 106 98 - 111 mmol/L   CO2 21 (L) 22 - 32 mmol/L   Glucose, Bld 87 70 - 99 mg/dL    Comment: Glucose reference range applies only to samples taken after fasting for at least 8 hours.   BUN 15 8 - 23 mg/dL   Creatinine, Ser 1.07 0.61 - 1.24 mg/dL   Calcium 8.1 (L) 8.9 - 10.3 mg/dL   Total Protein 6.2 (L) 6.5 - 8.1 g/dL   Albumin 3.1 (L) 3.5 - 5.0 g/dL   AST 20 15 - 41 U/L   ALT 24 0 - 44 U/L   Alkaline Phosphatase 45 38 - 126 U/L   Total Bilirubin 1.1 0.3 - 1.2 mg/dL   GFR, Estimated >60 >60 mL/min    Comment: (NOTE) Calculated using the CKD-EPI Creatinine Equation (2021)    Anion gap 9 5 - 15    Comment: Performed at [facility], [place] [address], [place], [place] [address]  CBC with Differential/Platelet     Status: Abnormal   Collection Time: 01/28/21  4:52 AM  Result Value Ref Range   WBC 8.2 4.0 - 10.5 K/uL   RBC 4.31 4.22 - 5.81 MIL/uL   Hemoglobin 12.5 (L) 13.0 - 17.0 g/dL   HCT 38.9 (L) 39.0 - 52.0 %   MCV 90.3 80.0 - 100.0 fL   MCH 29.0 26.0 - 34.0 pg   MCHC 32.1 30.0 - 36.0 g/dL   RDW 13.2 11.5 - 15.5 %   Platelets 192 150 - 400 K/uL   nRBC 0.0 0.0 - 0.2 %   Neutrophils Relative % 70 %   Neutro Abs 5.8 1.7 - 7.7 K/uL   Lymphocytes Relative 16 %   Lymphs Abs  1.3 0.7 - 4.0 K/uL   Monocytes Relative 9 %   Monocytes Absolute 0.8 0.1 - 1.0 K/uL   Eosinophils Relative 4 %   Eosinophils Absolute 0.3 0.0 - 0.5 K/uL   Basophils Relative 0 %   Basophils Absolute 0.0 0.0 - 0.1 K/uL   Immature Granulocytes 1 %   Abs Immature Granulocytes 0.04 0.00 -  0.07 K/uL    Comment: Performed at [facility], [place] [address] [place], [place] [address]    Imaging / Studies: CT ABDOMEN PELVIS W CONTRAST  Result Date: 01/25/2021 CLINICAL DATA:  Epigastric pain.  History of pancreatitis. EXAM: CT ABDOMEN AND PELVIS WITH CONTRAST TECHNIQUE: Multidetector CT imaging of the abdomen and pelvis was performed using the standard protocol following bolus administration of intravenous contrast. CONTRAST:  78mL OMNIPAQUE IOHEXOL 350 MG/ML SOLN COMPARISON:  CT abdomen pelvis dated Jul 25, 2020. FINDINGS: Lower chest: No acute abnormality. Unchanged mild bibasilar scarring. Hepatobiliary: Multiple simple cysts and subcentimeter low-density lesions within the liver are unchanged. Small gallstones again noted. No gallbladder wall thickening or biliary dilatation. Pancreas: Unremarkable. No pancreatic ductal dilatation or surrounding inflammatory changes. Spleen: Normal in size without focal abnormality. Adrenals/Urinary Tract: Adrenal glands are unremarkable. Kidneys are normal, without renal calculi, focal lesion, or hydronephrosis. Bladder is unremarkable. Stomach/Bowel: Unchanged small hiatal hernia. The stomach is otherwise within normal limits. No bowel wall thickening, distention, or surrounding inflammatory changes. Sigmoid colonic diverticulosis again noted. Normal appendix. Vascular/Lymphatic: Aortic atherosclerosis. No enlarged abdominal or pelvic lymph nodes. Reproductive: Unchanged prostatomegaly with median lobe hypertrophy indenting the bladder base. Other: No abdominal wall hernia or abnormality. No abdominopelvic ascites. No pneumoperitoneum. Musculoskeletal: No  acute or significant osseous findings. Unchanged chronic L1 compression deformity. IMPRESSION: 1. No acute intra-abdominal process. No CT evidence of acute pancreatitis. 2. Cholelithiasis. 3. Aortic Atherosclerosis (ICD10-I70.0). Electronically Signed   By: Titus Dubin M.D.   On: 01/25/2021 10:01   DG Chest Portable 1 View  Result Date: 01/25/2021 CLINICAL DATA:  87 year old male with history of epigastric abdominal pain. Pancreatitis. EXAM: PORTABLE CHEST 1 VIEW COMPARISON:  Chest x-ray 06/13/2018. FINDINGS: Lung volumes are low. No consolidative airspace disease. No pleural effusions. No pneumothorax. No pulmonary nodule or mass noted. Pulmonary vasculature and the cardiomediastinal silhouette are within normal limits. Multiple old healed left-sided rib fractures. IMPRESSION: 1. Low lung volumes without radiographic evidence of acute cardiopulmonary disease. Electronically Signed   By: Vinnie Langton M.D.   On: 01/25/2021 11:31   ECHOCARDIOGRAM COMPLETE  Result Date: 01/26/2021    ECHOCARDIOGRAM REPORT   Patient Name:   WALT GEATHERS Date of Exam: 01/26/2021 Medical Rec #:  [id number]            Height:       70.0 in Accession #:    [id number]           Weight:       180.0 lb Date of Birth:  05-13-1931           BSA:          1.996 m Patient Age:    11 years             BP:           141/73 mmHg Patient Gender: M                    HR:           96 bpm. Exam Location:  Inpatient Procedure: 2D Echo, Cardiac Doppler and Color Doppler Indications:    R94.31 Abnormal EKG  History:        Patient has no prior history of Echocardiogram examinations.                 Arrythmias:LBBB; Risk Factors:Hypertension.  Sonographer:    Glo Herring Referring Phys: [id number] [place]  1.  Left ventricular ejection fraction, by estimation, is 65 to 70%. The left ventricle has normal function. The left ventricle has no regional wall motion abnormalities. There is mild left ventricular  hypertrophy. Left ventricular diastolic parameters are consistent with Grade I diastolic dysfunction (impaired relaxation).  2. Right ventricular systolic function is normal. The right ventricular size is normal. Tricuspid regurgitation signal is inadequate for assessing PA pressure.  3. The mitral valve is normal in structure. No evidence of mitral valve regurgitation.  4. The aortic valve is tricuspid. Aortic valve regurgitation is not visualized. Mild aortic valve sclerosis is present, with no evidence of aortic valve stenosis.  5. The inferior vena cava is normal in size with greater than 50% respiratory variability, suggesting right atrial pressure of 3 mmHg. FINDINGS  Left Ventricle: Left ventricular ejection fraction, by estimation, is 65 to 70%. The left ventricle has normal function. The left ventricle has no regional wall motion abnormalities. The left ventricular internal cavity size was normal in size. There is  mild left ventricular hypertrophy. Left ventricular diastolic parameters are consistent with Grade I diastolic dysfunction (impaired relaxation). Right Ventricle: The right ventricular size is normal. No increase in right ventricular wall thickness. Right ventricular systolic function is normal. Tricuspid regurgitation signal is inadequate for assessing PA pressure. Left Atrium: Left atrial size was normal in size. Right Atrium: Right atrial size was normal in size. Pericardium: There is no evidence of pericardial effusion. Mitral Valve: The mitral valve is normal in structure. No evidence of mitral valve regurgitation. Tricuspid Valve: The tricuspid valve is normal in structure. Tricuspid valve regurgitation is not demonstrated. Aortic Valve: The aortic valve is tricuspid. Aortic valve regurgitation is not visualized. Mild aortic valve sclerosis is present, with no evidence of aortic valve stenosis. Aortic valve mean gradient measures 3.0 mmHg. Aortic valve peak gradient measures 6.2 mmHg.  Aortic valve area, by VTI measures 2.92 cm. Pulmonic Valve: The pulmonic valve was normal in structure. Pulmonic valve regurgitation is not visualized. Aorta: The aortic root is normal in size and structure. Venous: The inferior vena cava is normal in size with greater than 50% respiratory variability, suggesting right atrial pressure of 3 mmHg. IAS/Shunts: No atrial level shunt detected by color flow Doppler.  LEFT VENTRICLE PLAX 2D LVIDd:         3.50 cm   Diastology LVIDs:         2.10 cm   LV e' medial:    4.78 cm/s LV PW:         1.30 cm   LV E/e' medial:  10.7 LV IVS:        1.30 cm   LV e' lateral:   7.46 cm/s LVOT diam:     1.90 cm   LV E/e' lateral: 6.8 LV SV:         61 LV SV Index:   31 LVOT Area:     2.84 cm  RIGHT VENTRICLE RV Basal diam:  3.00 cm RV S prime:     11.50 cm/s LEFT ATRIUM             Index        RIGHT ATRIUM          Index LA diam:        3.50 cm 1.75 cm/m   RA Area:     8.63 cm LA Vol (A2C):   27.9 ml 13.98 ml/m  RA Volume:   16.00 ml 8.02 ml/m LA Vol (A4C):   23.4  ml 11.72 ml/m LA Biplane Vol: 25.6 ml 12.83 ml/m  AORTIC VALVE                    PULMONIC VALVE AV Area (Vmax):    2.97 cm     PV Vmax:       1.01 m/s AV Area (Vmean):   2.88 cm     PV Peak grad:  4.1 mmHg AV Area (VTI):     2.92 cm AV Vmax:           125.00 cm/s AV Vmean:          85.400 cm/s AV VTI:            0.209 m AV Peak Grad:      6.2 mmHg AV Mean Grad:      3.0 mmHg LVOT Vmax:         131.00 cm/s LVOT Vmean:        86.700 cm/s LVOT VTI:          0.215 m LVOT/AV VTI ratio: 1.03  AORTA Ao Root diam: 3.40 cm Ao Asc diam:  3.40 cm MITRAL VALVE MV Area (PHT): 5.97 cm     SHUNTS MV Decel Time: 127 msec     Systemic VTI:  0.22 m MV E velocity: 51.00 cm/s   Systemic Diam: 1.90 cm MV A velocity: 100.00 cm/s MV E/A ratio:  0.51 Dalton McleanMD Electronically signed by Franki Monte Signature Date/Time: 01/26/2021/4:33:07 PM    Final    US Abdomen Limited RUQ (LIVER/GB)  Result Date: 01/26/2021 CLINICAL DATA:   Abdominal pain. EXAM: ULTRASOUND ABDOMEN LIMITED RIGHT UPPER QUADRANT COMPARISON:  June 12, 2018. FINDINGS: Gallbladder: Cholelithiasis is noted with moderate gallbladder wall thickening measuring 5 mm. Pericholecystic fluid is noted as well as sludge within the gallbladder lumen. No sonographic Murphy's sign is noted. Common bile duct: Diameter: 6 mm which is within normal limits. Liver: No focal lesion identified. Within normal limits in parenchymal echogenicity. Portal vein is patent on color Doppler imaging with normal direction of blood flow towards the liver. Other: None. IMPRESSION: Cholelithiasis and sludge is noted within gallbladder lumen with moderate gallbladder wall thickening present as well as minimal pericholecystic fluid. These findings concerning for possible cholecystitis. HIDA scan is recommended for further evaluation. Electronically Signed   By: Marijo Conception M.D.   On: 01/26/2021 17:54     .Adin Hector, M.D., F.A.C.S. Gastrointestinal and Minimally Invasive Surgery [facility] [address] [place], [place] [address] [phone] Main / Paging  01/28/2021 6:55 AM    Adin Hector

## 2021-01-28 NOTE — Op Note (Signed)
01/28/2021  PATIENT:  Timothy Khan  87 y.o. male  Patient Care Team: [name], MD as PCP - General (Internal Medicine)  PRE-OPERATIVE DIAGNOSIS:    Acute on Chronic Calculus Cholecystitis  POST-OPERATIVE DIAGNOSIS:   Acute on Chronic Calculus Cholecystitis Empyema of the gallbladder  PROCEDURE:   Laparoscopic cholecystectomy (CPT code [phone]) Drainage of empyema of gallbladder Oversew of cystic duct with omentopexy  SURGEON:  Adin Hector, MD, FACS.  ASSISTANT: OR Staff   ANESTHESIA:    General with endotracheal intubation Local anesthetic as a field block  EBL:  (See Anesthesia Intraoperative Record) Total I/O In: 800 [I.V.:800] Out: 74 [Blood:50]  Delay start of Pharmacological VTE agent (>24hrs) due to surgical blood loss or risk of bleeding:  no  DRAINS: 19 Fr Blake closed bulb drain in the RUQ   SPECIMEN: Gallbladder    DISPOSITION OF SPECIMEN:  PATHOLOGY  COUNTS:  YES  PLAN OF CARE: Admit to inpatient   PATIENT DISPOSITION:  PACU - hemodynamically stable.  INDICATION: Pleasant older gentleman with worsening abdominal pain and exam in radiographic findings suspicious for cholecystitis.  Admitted with IV antibiotics and stabilized.  Recommendation made for cholecystectomy  The anatomy & physiology of hepatobiliary & pancreatic function was discussed.  The pathophysiology of gallbladder dysfunction was discussed.  Natural history risks without surgery was discussed.   I feel the risks of no intervention will lead to serious problems that outweigh the operative risks; therefore, I recommended cholecystectomy to remove the pathology.  I explained laparoscopic techniques with possible need for an open approach.  Probable cholangiogram to evaluate the bilary tract was explained as well.    Risks such as bleeding, infection, abscess, leak, injury to other organs, need for further treatment, heart attack, death, and other risks were discussed.  I noted  a good likelihood this will help address the problem.  Possibility that this will not correct all abdominal symptoms was explained.  Goals of post-operative recovery were discussed as well.  We will work to minimize complications.  An educational handout further explaining the pathology and treatment options was given as well.  Questions were answered.  The patient expresses understanding & wishes to proceed with surgery.  OR FINDINGS: Very inflamed distended gallbladder with some ischemia but not frank gangrene.  Implying with the gallbladder itself.  Short cystic duct with very poor tissue quality.  Probable right hepatic duct takeoff of the cystic duct.  Lumen at cystic duct/right hepatic duct junction oversewed and patched with omentum.  Drain placed.  Liver: normal  DESCRIPTION:  The patient was identified & brought in the operating room. The patient was positioned supine with arms tucked. SCDs were active during the entire case. The patient underwent general anesthesia without any difficulty.  The abdomen was prepped and draped in a sterile fashion. A Surgical Timeout confirmed our plan.  I made a transverse curvilinear incision through the superior umbilical fold.  I placed a 75mm long port through the supraumbilical fascia using a modified Hassan cutdown technique with umbilical stalk fascial countertraction. I began carbon dioxide insufflation.  No change in end tidal CO2 measurement.   Camera inspection revealed no injury. There were no adhesions to the anterior abdominal wall supraumbilically.  I proceeded to continue with single site technique. I placed a #5 port in left upper aspect of the wound. I placed a 5 mm atraumatic grasper in the right inferior aspect of the wound.  I turned attention to the right upper quadrant.  Gallbladder was thickened and distended but I was able to grab the gallbladder fundus and elevated somewhat cephalad. I freed adhesions to the ventral surface of the  gallbladder off carefully.  As I was elevating gallbladder wall tore and frank pus and bile spilled.  1 moderate size ellipsoid stone came out as well.  Aspirated the pus and bile and washed out extra liter.  Gallbladder very inflamed and oozing.  Extra 5 mm port placed in the right mid abdomen to assist with retraction  I freed the peritoneal coverings between the gallbladder and the liver on the posteriolateral and anteriomedial walls. I alternated between Harmonic & blunt Maryland dissection to help get a good critical view of the cystic artery and cystic duct.  did further dissection to free allof the gallbladder off the liver bed to get a good critical view of the infundibulum and cystic duct. I dissected out the cystic artery; and, after getting a good 360 view, ligated the anterior & posterior branches of the cystic artery close on the infundibulum using the Harmonic ultrasonic dissection.  Incision to a dome down approach to help clarify the anatomy since it appeared the cystic duct was going to be very short between the infundibulum and common bile duct.  I skeletonized the cystic duct.  But is extremely narrow and the infundibulum came off it quite easily with not much tension.  Therefore nor cholangiogram done.  Did elevate to the cystic arterial main branch and skeletonized it and did ligation with 0 PDS Endoloop and clips.  The lumen the cystic duct retracted and was rather close to the the right hepatic duct since I could see the main common bile duct going more medially with a sidebranch going to the right hepatic lobe.  I did not see this was safe to clip.  I therefore used a 3-0 V-Lock and did a short running closure over this stump using laparoscopic intracorporeal suturing to good result.  Did not impact the main lumen of the common bile duct.  Because the tissues were poor with empyema the gallbladder,  I placed a swath of omentum over the initial closure and ran the V-Loc over that to  provide omentopexy as well.    I placed the gallbladder inside and EcoSac along with a solitary stone.  I did copious irrigation of 3 more liters of saline.  Hemostasis was good on the gallbladder hepatic fossa.  No other stones noted.  No leaking of bile at the hepatic base.  Common bile duct intact.  No duct of Luschka obvious.  I decided to leave a drain given the very short cystic duct needing to be oversewn with poor tissue quality.  Pollard drain brought out the right upper quadrant port site and secured with Prolene.  Irrigation aspirated.  I removed the gallbladder out the supraumbilical fascia.  I did have to open up the fascia to 2.5 cm since the gallbladder was extremely thickened with a large stone.  I closed the fascia transversely using #1 PDS interrupted stitches. I closed the skin using 4-0 monocryl stitch.  Sterile dressing was applied. The patient was extubated & arrived in the PACU in stable condition..  I had discussed postoperative care with the patient in the holding area. I discussed operative findings, updated the patient's status, discussed probable steps to recovery, and gave postoperative recommendations to the  patient's daughter, Cecille Rubin, over the phone. .  Recommendations were made.  Questions were answered.  She expressed understanding &  appreciation.  Adin Hector, M.D., F.A.C.S. Gastrointestinal and Minimally Invasive Surgery [facility] [address], [place], [place] [address] [phone] Main / Paging  01/28/2021 9:49 AM

## 2021-01-29 ENCOUNTER — Encounter (HOSPITAL_COMMUNITY): Payer: Self-pay | Admitting: Surgery

## 2021-01-29 DIAGNOSIS — K219 Gastro-esophageal reflux disease without esophagitis: Secondary | ICD-10-CM | POA: Diagnosis not present

## 2021-01-29 DIAGNOSIS — R6510 Systemic inflammatory response syndrome (SIRS) of non-infectious origin without acute organ dysfunction: Secondary | ICD-10-CM | POA: Diagnosis not present

## 2021-01-29 DIAGNOSIS — R1013 Epigastric pain: Secondary | ICD-10-CM | POA: Diagnosis not present

## 2021-01-29 DIAGNOSIS — N400 Enlarged prostate without lower urinary tract symptoms: Secondary | ICD-10-CM | POA: Diagnosis not present

## 2021-01-29 LAB — CBC WITH DIFFERENTIAL/PLATELET
Abs Immature Granulocytes: 0.07 10*3/uL (ref 0.00–0.07)
Basophils Absolute: 0 10*3/uL (ref 0.0–0.1)
Basophils Relative: 0 %
Eosinophils Absolute: 0 10*3/uL (ref 0.0–0.5)
Eosinophils Relative: 0 %
HCT: 38.6 % — ABNORMAL LOW (ref 39.0–52.0)
Hemoglobin: 12.8 g/dL — ABNORMAL LOW (ref 13.0–17.0)
Immature Granulocytes: 1 %
Lymphocytes Relative: 7 %
Lymphs Abs: 0.9 10*3/uL (ref 0.7–4.0)
MCH: 29.1 pg (ref 26.0–34.0)
MCHC: 33.2 g/dL (ref 30.0–36.0)
MCV: 87.7 fL (ref 80.0–100.0)
Monocytes Absolute: 1 10*3/uL (ref 0.1–1.0)
Monocytes Relative: 8 %
Neutro Abs: 11.1 10*3/uL — ABNORMAL HIGH (ref 1.7–7.7)
Neutrophils Relative %: 84 %
Platelets: 230 10*3/uL (ref 150–400)
RBC: 4.4 MIL/uL (ref 4.22–5.81)
RDW: 12.9 % (ref 11.5–15.5)
WBC: 13.1 10*3/uL — ABNORMAL HIGH (ref 4.0–10.5)
nRBC: 0 % (ref 0.0–0.2)

## 2021-01-29 LAB — MAGNESIUM: Magnesium: 2.2 mg/dL (ref 1.7–2.4)

## 2021-01-29 LAB — COMPREHENSIVE METABOLIC PANEL
ALT: 109 U/L — ABNORMAL HIGH (ref 0–44)
AST: 94 U/L — ABNORMAL HIGH (ref 15–41)
Albumin: 3.2 g/dL — ABNORMAL LOW (ref 3.5–5.0)
Alkaline Phosphatase: 58 U/L (ref 38–126)
Anion gap: 8 (ref 5–15)
BUN: 17 mg/dL (ref 8–23)
CO2: 26 mmol/L (ref 22–32)
Calcium: 8.3 mg/dL — ABNORMAL LOW (ref 8.9–10.3)
Chloride: 101 mmol/L (ref 98–111)
Creatinine, Ser: 1.23 mg/dL (ref 0.61–1.24)
GFR, Estimated: 57 mL/min — ABNORMAL LOW (ref >60)
Glucose, Bld: 119 mg/dL — ABNORMAL HIGH (ref 70–99)
Potassium: 4.1 mmol/L (ref 3.5–5.1)
Sodium: 135 mmol/L (ref 135–145)
Total Bilirubin: 1 mg/dL (ref 0.3–1.2)
Total Protein: 6.4 g/dL — ABNORMAL LOW (ref 6.5–8.1)

## 2021-01-29 LAB — PHOSPHORUS: Phosphorus: 4.1 mg/dL (ref 2.5–4.6)

## 2021-01-29 LAB — PREALBUMIN: Prealbumin: 12.5 mg/dL — ABNORMAL LOW (ref 18–38)

## 2021-01-29 NOTE — Evaluation (Signed)
Occupational Therapy Evaluation Patient Details Name: Timothy Khan MRN: [id number] DOB: 04-Jul-1931 Today's Date: 01/29/2021   History of Present Illness Pt is an 87y.o. male who presented to the ED with epigastric abdominal pain associated with 4 episodes of emesis, CT noted cholelithiasis.  s/p lap chole and drainage of empyema of gall bladder with drain placed on 01/28/21. PMH including but not limited to idiopathic pancreatitis, hyperlipidemia, hypertension, peripheral neuropathy, subdural hematoma, stage III CKD   Clinical Impression   Pt walked with a cane and was caregiver of his wife with the help of his family and a 20 hour a week caregiver. Pt was modified independent in self care (sits to shower) and assisted with IADL as needed. Pt presents with genealized weakness and impaired balance. He needs up to min guard assist with RW for mobility and ADL is likely to progress well and not need post acute OT. Will follow.     Recommendations for follow up therapy are one component of a multi-disciplinary discharge planning process, led by the attending physician.  Recommendations may be updated based on patient status, additional functional criteria and insurance authorization.   Follow Up Recommendations  No OT follow up    Assistance Recommended at Discharge None  Functional Status Assessment  Patient has had a recent decline in their functional status and demonstrates the ability to make significant improvements in function in a reasonable and predictable amount of time.  Equipment Recommendations  None recommended by OT    Recommendations for Other Services       Precautions / Restrictions Precautions Precautions: Fall Precaution Comments: JP drain      Mobility Bed Mobility               General bed mobility comments: in chair    Transfers Overall transfer level: Needs assistance Equipment used: Rolling walker (2 wheels) Transfers: Sit to/from  Stand Sit to Stand: Min guard           General transfer comment: from recliner      Balance Overall balance assessment: Needs assistance   Sitting balance-Leahy Scale: Good Sitting balance - Comments: no LOB donning socks   Standing balance support: Bilateral upper extremity supported Standing balance-Leahy Scale: Poor Standing balance comment: reliant on RW                           ADL either performed or assessed with clinical judgement   ADL Overall ADL's : Needs assistance/impaired Eating/Feeding: Independent   Grooming: Min guard;Standing   Upper Body Bathing: Set up;Sitting   Lower Body Bathing: Sit to/from stand;Min guard   Upper Body Dressing : Set up;Sitting   Lower Body Dressing: Sit to/from stand;Min guard Lower Body Dressing Details (indicate cue type and reason): able to don socks crossing foot over opposite knee Toilet Transfer: Min guard;Ambulation;Rolling walker (2 wheels)   Toileting- Clothing Manipulation and Hygiene: Min guard;Sit to/from stand       Functional mobility during ADLs: Min guard;Rolling walker (2 wheels)       Vision Baseline Vision/History: 1 Wears glasses Ability to See in Adequate Light: 0 Adequate Patient Visual Report: No change from baseline       Perception     Praxis      Pertinent Vitals/Pain Pain Assessment: No/denies pain     Hand Dominance Right   Extremity/Trunk Assessment Upper Extremity Assessment Upper Extremity Assessment: Overall WFL for tasks assessed   Lower Extremity Assessment  Lower Extremity Assessment: Defer to PT evaluation   Cervical / Trunk Assessment Cervical / Trunk Assessment: Normal   Communication Communication Communication: No difficulties   Cognition Arousal/Alertness: Awake/alert Behavior During Therapy: WFL for tasks assessed/performed Overall Cognitive Status: Within Functional Limits for tasks assessed                                        General Comments       Exercises     Shoulder Instructions      Home Living Family/patient expects to be discharged to:: Private residence Living Arrangements: Spouse/significant other Available Help at Discharge: Personal care attendant;Family;Available 24 hours/day (caregiver is primarily for wife who has dementia) Type of Home: House Home Access: Stairs to enter [employer] of Steps: 4 Entrance Stairs-Rails: Left Home Layout: Two level;Able to live on main level with bedroom/bathroom Alternate Level Stairs-Number of Steps: 13 steps, car is down the basement steps Alternate Level Stairs-Rails: Left Bathroom Shower/Tub: Teacher, early years/pre: Standard     Home Equipment: Tub bench;Grab bars - tub/shower;Grab bars - toilet;Cane - quad;Rolling Walker (2 wheels)          Prior Functioning/Environment Prior Level of Function : Independent/Modified Independent;Driving             Mobility Comments: use of cane ADLs Comments: sits to shower, assist for IADL        OT Problem List: Impaired balance (sitting and/or standing);Decreased strength      OT Treatment/Interventions: Self-care/ADL training;DME and/or AE instruction;Therapeutic activities;Patient/family education;Balance training    OT Goals(Current goals can be found in the care plan section) Acute Rehab OT Goals OT Goal Formulation: With patient Time For Goal Achievement: 02/12/21 Potential to Achieve Goals: Good ADL Goals Pt Will Perform Grooming: with modified independence;standing Pt Will Perform Lower Body Bathing: with modified independence;sit to/from stand Pt Will Perform Lower Body Dressing: with modified independence;sit to/from stand Pt Will Transfer to Toilet: with modified independence;ambulating Pt Will Perform Toileting - Clothing Manipulation and hygiene: with modified independence;sit to/from stand Additional ADL Goal #1: Pt will gather items necessary for ADL with  RW  OT Frequency: Min 2X/week   Barriers to D/C:            Co-evaluation              AM-PAC OT "6 Clicks" Daily Activity     Outcome Measure Help from another person eating meals?: None Help from another person taking care of personal grooming?: A Little Help from another person toileting, which includes using toliet, bedpan, or urinal?: A Little Help from another person bathing (including washing, rinsing, drying)?: None Help from another person to put on and taking off regular upper body clothing?: A Little   6 Click Score: 17   End of Session Equipment Utilized During Treatment: Rolling walker (2 wheels);Oxygen  Activity Tolerance: Patient tolerated treatment well Patient left: in chair;with call bell/phone within reach;with chair alarm set;with family/visitor present  OT Visit Diagnosis: Unsteadiness on feet (R26.81);Other abnormalities of gait and mobility (R26.89);Muscle weakness (generalized) (M62.81)                Time: 8119-1478 OT Time Calculation (min): 24 min Charges:  OT General Charges $OT Visit: 1 Visit OT Evaluation $OT Eval Low Complexity: 1 Low OT Treatments $Self Care/Home Management : 8-22 mins  Nestor Lewandowsky, OTR/L Acute Rehabilitation Services Pager:  [phone] Office: [phone]   Malka So 01/29/2021, 12:20 PM

## 2021-01-29 NOTE — Progress Notes (Signed)
PROGRESS NOTE  Timothy Khan NLZ:[id number] DOB: Oct 19, 1931 DOA: 01/25/2021 PCP: [name], MD  HPI/Recap of past 24 hours: Timothy Khan is a 87 year old male with past medical history of idiopathic pancreatitis, hyperlipidemia, hypertension, peripheral neuropathy, subdural hematoma, stage III CKD who presented to the ED with epigastric abdominal pain associated with 4 episodes of emesis, felt similar to when he had his pancreatitis episodes. No other new complaints. His caregiver stated that he ate a substantial amount of pistachios in the past few days. In the ED, noted to be tachycardic, otherwise stable, labs showed leukocytosis, LA minimally elevated, lipase WNL otherwise stable.  Chest x-ray unremarkable.  CT abdomen/pelvis without any acute intracranial abdominal process, no evidence of acute pancreatitis, noted cholelithiasis.  Patient admitted for further management.    Today, pt reports some mild post op pain, otherwise denies any new complaints. Daughter at bedside.      Assessment/Plan: Principal Problem:   SIRS (systemic inflammatory response syndrome) (HCC) Active Problems:   Hyperlipidemia   Hypertension   BPH (benign prostatic hyperplasia)   GERD (gastroesophageal reflux disease)   Intractable abdominal pain   Nausea and vomiting   Epigastric pain   Sepsis likely 2/2 acute on chronic calculus cholecystitis  Empyema of gallbladder  On admission, tachycardic with leukocytosis Currently afebrile, with leukocytosis BC x2 NGTD CT abdomen/pelvis unremarkable, no evidence of acute pancreatitis, noted gallstones RUQ USS notable for acute cholecystitis General surgery consulted, s/p lap chole and drainage of empyema of gall bladder with drain placed on 01/28/21 Continue IV Zosyn D/C gentle IV fluids Further post op care per surgery  GERD Continue IV PPI  Hypertension Continue amlodipine, IV hydralazine prn  Chronic diastolic HF Appears  euvolemic Echo shows EF of 65 to 90%, grade 1 diastolic dysfunction  BPH Continue Flomax at bedtime      Estimated body mass index is 24.39 kg/m as calculated from the following:   Height as of this encounter: 5\' 10"  (1.778 m).   Weight as of this encounter: 77.1 kg.     Code Status: Full  Family Communication: Daughter at bedside  Disposition Plan: Status is: Inpatient  Remains inpatient appropriate because: Level of care     Consultants: General surgery   Procedures: Lap chole on 11/11  Antimicrobials: Zosyn  DVT prophylaxis: Lovenox   Objective: Vitals:   01/28/21 1104 01/28/21 2100 01/29/21 0409 01/29/21 1304  BP: (!) 167/78 140/79 116/70 139/69  Pulse: (!) 105 98 72 80  Resp: 20 18 20 20   Temp: 97.9 F (36.6 C) 98.1 F (36.7 C) 98.2 F (36.8 C) 97.7 F (36.5 C)  TempSrc: Oral Oral Oral Oral  SpO2: 95% 98% 98% 100%  Weight:      Height:        Intake/Output Summary (Last 24 hours) at 01/29/2021 1500 Last data filed at 01/29/2021 0600 Gross per 24 hour  Intake 3196.17 ml  Output 2455 ml  Net 741.17 ml   Filed Weights   01/28/21 0639  Weight: 77.1 kg    Exam: General: NAD  Cardiovascular: S1, S2 present Respiratory: CTAB Abdomen: Soft, tender, +distended, bowel sounds present, dressing c/d/I, drain noted Musculoskeletal: Trace bilateral pedal edema noted Skin: Normal Psychiatry: Normal mood     Data Reviewed: CBC: Recent Labs  Lab 01/25/21 0631 01/26/21 0515 01/27/21 0507 01/28/21 0452 01/29/21 0546  WBC 13.2* 18.5* 11.0* 8.2 13.1*  NEUTROABS 11.8*  --  7.7 5.8 11.1*  HGB 16.1 15.6 12.7* 12.5* 12.8*  HCT  48.9 46.6 38.7* 38.9* 38.6*  MCV 86.2 86.3 89.6 90.3 87.7  PLT 235 228 179 192 341   Basic Metabolic Panel: Recent Labs  Lab 01/25/21 0631 01/26/21 0515 01/27/21 0507 01/28/21 0452 01/29/21 0546  NA 135 136 137 136 135  K 4.3 4.2 4.1 4.0 4.1  CL 97* 102 105 106 101  CO2 26 25 28  21* 26  GLUCOSE 175* 124* 94  87 119*  BUN 21 17 19 15 17   CREATININE 1.20 1.21 1.28* 1.07 1.23  CALCIUM 8.9 8.1* 8.1* 8.1* 8.3*  MG 2.2  --   --   --  2.2  PHOS  --   --   --   --  4.1   GFR: Estimated Creatinine Clearance: 43.7 mL/min (by C-G formula based on SCr of 1.23 mg/dL). Liver Function Tests: Recent Labs  Lab 01/25/21 0631 01/26/21 0515 01/27/21 0507 01/28/21 0452 01/29/21 0546  AST 32 34 22 20 94*  ALT 23 34 28 24 109*  ALKPHOS 67 53 45 45 58  BILITOT 1.0 1.7* 1.5* 1.1 1.0  PROT 8.8* 6.9 6.1* 6.2* 6.4*  ALBUMIN 4.8 3.5 3.2* 3.1* 3.2*   Recent Labs  Lab 01/25/21 0631  LIPASE 32   No results for input(s): AMMONIA in the last 168 hours. Coagulation Profile: No results for input(s): INR, PROTIME in the last 168 hours. Cardiac Enzymes: No results for input(s): CKTOTAL, CKMB, CKMBINDEX, TROPONINI in the last 168 hours. BNP (last 3 results) No results for input(s): PROBNP in the last 8760 hours. HbA1C: No results for input(s): HGBA1C in the last 72 hours. CBG: No results for input(s): GLUCAP in the last 168 hours. Lipid Profile: No results for input(s): CHOL, HDL, LDLCALC, TRIG, CHOLHDL, LDLDIRECT in the last 72 hours. Thyroid Function Tests: No results for input(s): TSH, T4TOTAL, FREET4, T3FREE, THYROIDAB in the last 72 hours. Anemia Panel: No results for input(s): VITAMINB12, FOLATE, FERRITIN, TIBC, IRON, RETICCTPCT in the last 72 hours. Urine analysis:    Component Value Date/Time   COLORURINE YELLOW 01/25/2021 0813   APPEARANCEUR CLEAR 01/25/2021 0813   LABSPEC 1.016 01/25/2021 0813   PHURINE 7.0 01/25/2021 0813   GLUCOSEU 50 (A) 01/25/2021 0813   HGBUR NEGATIVE 01/25/2021 0813   BILIRUBINUR NEGATIVE 01/25/2021 0813   KETONESUR 5 (A) 01/25/2021 0813   PROTEINUR >=300 (A) 01/25/2021 0813   NITRITE NEGATIVE 01/25/2021 0813   LEUKOCYTESUR NEGATIVE 01/25/2021 0813   Sepsis Labs: @LABRCNTIP (procalcitonin:4,lacticidven:4)  ) Recent Results (from the past 240 hour(s))  Resp Panel  by RT-PCR (Flu A&B, Covid) Nasopharyngeal Swab     Status: None   Collection Time: 01/25/21 12:00 PM   Specimen: Nasopharyngeal Swab; Nasopharyngeal(NP) swabs in vial transport medium  Result Value Ref Range Status   SARS Coronavirus 2 by RT PCR NEGATIVE NEGATIVE Final    Comment: (NOTE) SARS-CoV-2 target nucleic acids are NOT DETECTED.  The SARS-CoV-2 RNA is generally detectable in upper respiratory specimens during the acute phase of infection. The lowest concentration of SARS-CoV-2 viral copies this assay can detect is 138 copies/mL. A negative result does not preclude SARS-Cov-2 infection and should not be used as the sole basis for treatment or other patient management decisions. A negative result may occur with  improper specimen collection/handling, submission of specimen other than nasopharyngeal swab, presence of viral mutation(s) within the areas targeted by this assay, and inadequate number of viral copies(<138 copies/mL). A negative result must be combined with clinical observations, patient history, and epidemiological information. The expected result is  Negative.  Fact Sheet for Patients:  EntrepreneurPulse.com.au  Fact Sheet for Healthcare Providers:  IncredibleEmployment.be  This test is no t yet approved or cleared by the [place] FDA and  has been authorized for detection and/or diagnosis of SARS-CoV-2 by FDA under an Emergency Use Authorization (EUA). This EUA will remain  in effect (meaning this test can be used) for the duration of the COVID-19 declaration under Section 564(b)(1) of the Act, 21 U.S.C.section 360bbb-3(b)(1), unless the authorization is terminated  or revoked sooner.       Influenza A by PCR NEGATIVE NEGATIVE Final   Influenza B by PCR NEGATIVE NEGATIVE Final    Comment: (NOTE) The Xpert Xpress SARS-CoV-2/FLU/RSV plus assay is intended as an aid in the diagnosis of influenza from Nasopharyngeal swab  specimens and should not be used as a sole basis for treatment. Nasal washings and aspirates are unacceptable for Xpert Xpress SARS-CoV-2/FLU/RSV testing.  Fact Sheet for Patients: EntrepreneurPulse.com.au  Fact Sheet for Healthcare Providers: IncredibleEmployment.be  This test is not yet approved or cleared by the [place] FDA and has been authorized for detection and/or diagnosis of SARS-CoV-2 by FDA under an Emergency Use Authorization (EUA). This EUA will remain in effect (meaning this test can be used) for the duration of the COVID-19 declaration under Section 564(b)(1) of the Act, 21 U.S.C. section 360bbb-3(b)(1), unless the authorization is terminated or revoked.  Performed at [facility], [place] [address] [place], [place] [address]   Blood culture (routine x 2)     Status: None (Preliminary result)   Collection Time: 01/25/21 12:10 PM   Specimen: BLOOD  Result Value Ref Range Status   Specimen Description   Final    BLOOD RIGHT ANTECUBITAL Performed at [facility] [address] [place], [place] [address]    Special Requests   Final    BOTTLES DRAWN AEROBIC AND ANAEROBIC Blood Culture adequate volume Performed at [facility] [address], [place], [place] [address]    Culture   Final    NO GROWTH 4 DAYS Performed at [facility], [place] [address] [place], [place] [address]    Report Status PENDING  Incomplete  Blood culture (routine x 2)     Status: None (Preliminary result)   Collection Time: 01/25/21 12:25 PM   Specimen: BLOOD  Result Value Ref Range Status   Specimen Description   Final    BLOOD LEFT ANTECUBITAL Performed at [facility] [address] [place], [place] [address]    Special Requests   Final    BOTTLES DRAWN AEROBIC AND ANAEROBIC Blood Culture adequate volume Performed at [facility]  [address] [place], [place] [address]    Culture   Final    NO GROWTH 4 DAYS Performed at [facility], [place] [address], [place], [place] [address]    Report Status PENDING  Incomplete      Studies: No results found.  Scheduled Meds:  amLODipine  5 mg Oral QHS   enoxaparin (LOVENOX) injection  40 mg Subcutaneous Q24H   gabapentin  600 mg Oral QHS   lip balm  1 application Topical BID   pantoprazole (PROTONIX) IV  40 mg Intravenous Q24H   polycarbophil  625 mg Oral BID   sodium chloride flush  3 mL Intravenous Q12H   tamsulosin  0.4 mg Oral QHS    Continuous Infusions:  sodium chloride     lactated  ringers     lactated ringers 75 mL/hr at 01/29/21 0420   methocarbamol (ROBAXIN) IV     ondansetron (ZOFRAN) IV     piperacillin-tazobactam (ZOSYN)  IV 3.375 g (01/29/21 1141)     LOS: 3 days     Alma Friendly, MD [facility]  If 7PM-7AM, please contact night-coverage [email or web address] 01/29/2021, 3:00 PM

## 2021-01-29 NOTE — Evaluation (Signed)
Physical Therapy Evaluation Patient Details Name: Timothy Khan MRN: [id number] DOB: 02-09-1932 Today's Date: 01/29/2021  History of Present Illness  Pt is an 87y.o. male who presented to the ED with epigastric abdominal pain associated with 4 episodes of emesis, CT noted cholelithiasis.  s/p lap chole and drainage of empyema of gall bladder with drain placed on 01/28/21. PMH including but not limited to idiopathic pancreatitis, hyperlipidemia, hypertension, peripheral neuropathy, subdural hematoma, stage III CKD   Clinical Impression  Pt is an 87 y.o. male wit POD #1  s/p lap chole and drainage of empyema of gall bladder with drain placed on 01/28/21 resulting in the deficits listed below (see PT Problem List). Pt reports independent with mobility and use of quad cane at baseline. Pt requiring multiple attempts to perform sit to stand transfers from EOB, ultimately able to perform with up to MIN A for power up and with use of RW from elevated surface to simulate home environment. Pt ambulated ~184ft with MIN A- MIN guard for safety and cues for RW management. PT educated pt and family on use of RW vs. quad cane at this time due to current balance impairments/unsteadiness as well as assist for stair negotiation and mobility upon d/c to maximize safety, both verbalized understanding. Pt will have 24hr assist from family members upon d/c. Pt will benefit from continued skilled PT to maximize functional mobility and increase independence in order to return to PLOF.         Recommendations for follow up therapy are one component of a multi-disciplinary discharge planning process, led by the attending physician.  Recommendations may be updated based on patient status, additional functional criteria and insurance authorization.  Follow Up Recommendations Home health PT    Assistance Recommended at Discharge Frequent or constant Supervision/Assistance  Functional Status Assessment Patient has had  a recent decline in their functional status and demonstrates the ability to make significant improvements in function in a reasonable and predictable amount of time.  Equipment Recommendations  None recommended by PT (pt owns RW)    Recommendations for Other Services       Precautions / Restrictions Precautions Precautions: Other (comment) Precaution Comments: drain placed Restrictions Weight Bearing Restrictions: No      Mobility  Bed Mobility Overal bed mobility: Needs Assistance Bed Mobility: Supine to Sit     Supine to sit: Supervision;HOB elevated     General bed mobility comments: supervision for safety, slightly increased time    Transfers Overall transfer level: Needs assistance Equipment used: Rolling walker (2 wheels);Quad cane;None Transfers: Sit to/from Stand Sit to Stand: Min guard;From elevated surface           General transfer comment: x2 from EOB. Pt initially with multiple unsuccessful attempts to rise from EOB, attempted with use of personal quad cane requiring MIN A for power up/ stability. Pt with posterior leaning/LOB with use of quad cane in standing requiring up to MIN A from PT to maintain balance. Pt returned to sitting to assess stability with use of RW, pt  able to perform sit to stand from EOB with MIN guard and elevated bed height to simulate home environment, cues provided for safe hand placement with use of RW.    Ambulation/Gait Ambulation/Gait assistance: Min assist;Min guard Gait Distance (Feet): 160 Feet Assistive device: Rolling walker (2 wheels) Gait Pattern/deviations: Step-through pattern;Decreased stride length (forward flexion (2/2 abdominal pain/incision)) Gait velocity: slightly decreased     General Gait Details: Pt ambulated with MIN A  progressing to MIN guard for safety. Cues for close proximity to RW and upright posture. Pt noted with forward lean, reports due to discomfort in abdominal region with full upright posture.  PT educated pt and family on use of RW vs. quad cane at this time due to current balance impairments/unsteadiness, both verbalized understanding.  Stairs            Wheelchair Mobility    Modified Rankin (Stroke Patients Only)       Balance Overall balance assessment: Needs assistance Sitting-balance support: Feet supported Sitting balance-Leahy Scale: Fair   Postural control: Posterior lean (in standing) Standing balance support: Single extremity supported;Bilateral upper extremity supported Standing balance-Leahy Scale: Poor Standing balance comment: reliant on external support                             Pertinent Vitals/Pain Pain Assessment: 0-10 Pain Score: 7  Pain Location: upper abdomen Pain Descriptors / Indicators: Discomfort;Sore Pain Intervention(s): Limited activity within patient's tolerance;Monitored during session;Repositioned    Home Living Family/patient expects to be discharged to:: Private residence Living Arrangements: Spouse/significant other Available Help at Discharge: Personal care attendant;Family;Available 24 hours/day (20hrs/wk) Type of Home: House Home Access: Stairs to enter Entrance Stairs-Rails: Left Entrance Stairs-Number of Steps: 4 Alternate Level Stairs-Number of Steps: 13 steps, car is down the basement steps Home Layout: Two level;Able to live on main level with bedroom/bathroom (basement) Home Equipment: Tub bench;Grab bars - tub/shower;Grab bars - toilet;Cane - quad;Rolling Walker (2 wheels) Additional Comments: 2 daughters live close by. Someone there to check in at least once/week. Daughters states that she can stay overnight upon d/c.    Prior Function Prior Level of Function : Independent/Modified Independent;Driving             Mobility Comments: use of cane ADLs Comments: (I) , assist with home management and cooking from family     Hand Dominance   Dominant Hand: Right    Extremity/Trunk  Assessment   Upper Extremity Assessment Upper Extremity Assessment: Overall WFL for tasks assessed    Lower Extremity Assessment Lower Extremity Assessment: Generalized weakness    Cervical / Trunk Assessment Cervical / Trunk Assessment: Normal  Communication   Communication: No difficulties  Cognition Arousal/Alertness: Awake/alert Behavior During Therapy: WFL for tasks assessed/performed Overall Cognitive Status: Within Functional Limits for tasks assessed                                          General Comments General comments (skin integrity, edema, etc.): pt on 2L [place] upon entry. O2 sat reading 93%. Pt trialed on RA with mobility with O2 sat low of 88%-95%. Pt O2 reading 88% upon return to room, fluctuating between 88-90%. Pt placed back on 2L [place] at end of session, RN notified.    Exercises     Assessment/Plan    PT Assessment Patient needs continued PT services  PT Problem List Decreased strength;Decreased range of motion;Decreased activity tolerance;Decreased balance;Decreased mobility;Decreased knowledge of use of DME;Pain       PT Treatment Interventions DME instruction;Gait training;Stair training;Functional mobility training;Therapeutic activities;Therapeutic exercise;Balance training;Patient/family education    PT Goals (Current goals can be found in the Care Plan section)  Acute Rehab PT Goals Patient Stated Goal: Go home PT Goal Formulation: With patient/family Time For Goal Achievement: 02/12/21 Potential to Achieve Goals: Good  Frequency Min 3X/week   Barriers to discharge        Co-evaluation               AM-PAC PT "6 Clicks" Mobility  Outcome Measure Help needed turning from your back to your side while in a flat bed without using bedrails?: A Little Help needed moving from lying on your back to sitting on the side of a flat bed without using bedrails?: A Little Help needed moving to and from a bed to a chair (including  a wheelchair)?: A Little Help needed standing up from a chair using your arms (e.g., wheelchair or bedside chair)?: A Little Help needed to walk in hospital room?: A Little Help needed climbing 3-5 steps with a railing? : A Little 6 Click Score: 18    End of Session Equipment Utilized During Treatment: Gait belt Activity Tolerance: Patient tolerated treatment well Patient left: in chair;with call bell/phone within reach;with family/visitor present Nurse Communication: Mobility status PT Visit Diagnosis: Unsteadiness on feet (R26.81);Pain;Muscle weakness (generalized) (M62.81) Pain - Right/Left: Right Pain - part of body:  (abdomen)    Time: 2637-8588 PT Time Calculation (min) (ACUTE ONLY): 35 min   Charges:   PT Evaluation $PT Eval Low Complexity: 1 Low PT Treatments $Gait Training: 8-22 mins        Festus Barren PT, [name]  Acute Rehabilitation Services  Office [phone]   01/29/2021, 11:24 AM

## 2021-01-29 NOTE — Progress Notes (Signed)
1 Day Post-Op   Subjective/Chief Complaint: No complaints. Tolerating diet. Needs help walking   Objective: Vital signs in last 24 hours: Temp:  [97.9 F (36.6 C)-98.2 F (36.8 C)] 98.2 F (36.8 C) (11/12 0409) Pulse Rate:  [72-105] 72 (11/12 0409) Resp:  [18-20] 20 (11/12 0409) BP: (116-167)/(70-79) 116/70 (11/12 0409) SpO2:  [95 %-98 %] 98 % (11/12 0409) Last BM Date: 01/26/21  Intake/Output from previous day: 11/11 0701 - 11/12 0700 In: 3999.2 [P.O.:1508; I.V.:2368; IV Piggyback:123.2] Out: 2620 [Urine:2400; Drains:170; Blood:50] Intake/Output this shift: No intake/output data recorded.  General appearance: alert and cooperative Resp: clear to auscultation bilaterally Cardio: regular rate and rhythm GI: soft, minimal tenderness  Lab Results:  Recent Labs    01/28/21 0452 01/29/21 0546  WBC 8.2 13.1*  HGB 12.5* 12.8*  HCT 38.9* 38.6*  PLT 192 230   BMET Recent Labs    01/28/21 0452 01/29/21 0546  NA 136 135  K 4.0 4.1  CL 106 101  CO2 21* 26  GLUCOSE 87 119*  BUN 15 17  CREATININE 1.07 1.23  CALCIUM 8.1* 8.3*   PT/INR No results for input(s): LABPROT, INR in the last 72 hours. ABG No results for input(s): PHART, HCO3 in the last 72 hours.  Invalid input(s): PCO2, PO2  Studies/Results: DG C-Arm 1-60 Min-No Report  Result Date: 01/28/2021 Fluoroscopy was utilized by the requesting physician.  No radiographic interpretation.    Anti-infectives: Anti-infectives (From admission, onward)    Start     Dose/Rate Route Frequency Ordered Stop   01/28/21 1200  piperacillin-tazobactam (ZOSYN) IVPB 3.375 g        3.375 g 12.5 mL/hr over 240 Minutes Intravenous Every 8 hours 01/28/21 1105     01/28/21 1145  piperacillin-tazobactam (ZOSYN) IVPB 3.375 g  Status:  Discontinued        3.375 g 12.5 mL/hr over 240 Minutes Intravenous Every 8 hours 01/28/21 1052 01/28/21 1105   01/28/21 0900  metroNIDAZOLE (FLAGYL) IVPB 500 mg       Note to Pharmacy: Send to  OR.  Thanks!   500 mg 100 mL/hr over 60 Minutes Intravenous  Once 01/28/21 0851 01/28/21 1030   01/27/21 1130  cefTRIAXone (ROCEPHIN) 2 g in sodium chloride 0.9 % 100 mL IVPB  Status:  Discontinued        2 g 200 mL/hr over 30 Minutes Intravenous On call to O.R. 01/27/21 1038 01/28/21 0559       Assessment/Plan: s/p Procedure(s): Laparscopic Cholecystectomy;  Drainage of empyema of gallbladder; Oversew of cystic duct with omentopexy (N/A) Advance diet Continue drain and abx Will consult PT  LOS: 3 days    Autumn Messing III 01/29/2021

## 2021-01-30 DIAGNOSIS — R1013 Epigastric pain: Secondary | ICD-10-CM | POA: Diagnosis not present

## 2021-01-30 DIAGNOSIS — N400 Enlarged prostate without lower urinary tract symptoms: Secondary | ICD-10-CM | POA: Diagnosis not present

## 2021-01-30 DIAGNOSIS — A419 Sepsis, unspecified organism: Principal | ICD-10-CM

## 2021-01-30 DIAGNOSIS — K219 Gastro-esophageal reflux disease without esophagitis: Secondary | ICD-10-CM | POA: Diagnosis not present

## 2021-01-30 LAB — COMPREHENSIVE METABOLIC PANEL
ALT: 81 U/L — ABNORMAL HIGH (ref 0–44)
AST: 45 U/L — ABNORMAL HIGH (ref 15–41)
Albumin: 3 g/dL — ABNORMAL LOW (ref 3.5–5.0)
Alkaline Phosphatase: 58 U/L (ref 38–126)
Anion gap: 5 (ref 5–15)
BUN: 15 mg/dL (ref 8–23)
CO2: 28 mmol/L (ref 22–32)
Calcium: 7.8 mg/dL — ABNORMAL LOW (ref 8.9–10.3)
Chloride: 106 mmol/L (ref 98–111)
Creatinine, Ser: 1.33 mg/dL — ABNORMAL HIGH (ref 0.61–1.24)
GFR, Estimated: 52 mL/min — ABNORMAL LOW (ref >60)
Glucose, Bld: 100 mg/dL — ABNORMAL HIGH (ref 70–99)
Potassium: 3.7 mmol/L (ref 3.5–5.1)
Sodium: 139 mmol/L (ref 135–145)
Total Bilirubin: 0.6 mg/dL (ref 0.3–1.2)
Total Protein: 6 g/dL — ABNORMAL LOW (ref 6.5–8.1)

## 2021-01-30 LAB — CBC WITH DIFFERENTIAL/PLATELET
Abs Immature Granulocytes: 0.08 10*3/uL — ABNORMAL HIGH (ref 0.00–0.07)
Basophils Absolute: 0 10*3/uL (ref 0.0–0.1)
Basophils Relative: 0 %
Eosinophils Absolute: 0.4 10*3/uL (ref 0.0–0.5)
Eosinophils Relative: 4 %
HCT: 37.5 % — ABNORMAL LOW (ref 39.0–52.0)
Hemoglobin: 12.2 g/dL — ABNORMAL LOW (ref 13.0–17.0)
Immature Granulocytes: 1 %
Lymphocytes Relative: 17 %
Lymphs Abs: 1.8 10*3/uL (ref 0.7–4.0)
MCH: 29 pg (ref 26.0–34.0)
MCHC: 32.5 g/dL (ref 30.0–36.0)
MCV: 89.1 fL (ref 80.0–100.0)
Monocytes Absolute: 0.9 10*3/uL (ref 0.1–1.0)
Monocytes Relative: 9 %
Neutro Abs: 6.9 10*3/uL (ref 1.7–7.7)
Neutrophils Relative %: 69 %
Platelets: 233 10*3/uL (ref 150–400)
RBC: 4.21 MIL/uL — ABNORMAL LOW (ref 4.22–5.81)
RDW: 13.1 % (ref 11.5–15.5)
WBC: 10 10*3/uL (ref 4.0–10.5)
nRBC: 0 % (ref 0.0–0.2)

## 2021-01-30 LAB — CULTURE, BLOOD (ROUTINE X 2)
Culture: NO GROWTH
Culture: NO GROWTH
Special Requests: ADEQUATE
Special Requests: ADEQUATE

## 2021-01-30 MED ORDER — TRAMADOL HCL 50 MG PO TABS: 50.0000 mg | ORAL_TABLET | Freq: Four times a day (QID) | ORAL | 0 refills | Status: DC | PRN

## 2021-01-30 MED ORDER — AMOXICILLIN-POT CLAVULANATE 875-125 MG PO TABS: 1.0000 | ORAL_TABLET | Freq: Two times a day (BID) | ORAL | 0 refills | Status: DC

## 2021-01-30 MED ORDER — POLYETHYLENE GLYCOL 3350 17 G PO PACK
17.0000 g | PACK | Freq: Every day | ORAL | 0 refills | Status: AC
Start: 2021-01-30 — End: 2021-02-06

## 2021-01-30 MED ORDER — AMOXICILLIN-POT CLAVULANATE 875-125 MG PO TABS: 1.0000 | ORAL_TABLET | Freq: Two times a day (BID) | ORAL | 0 refills | Status: AC

## 2021-01-30 MED ORDER — POLYETHYLENE GLYCOL 3350 17 G PO PACK: 17.0000 g | PACK | Freq: Every day | ORAL | 0 refills | Status: DC

## 2021-01-30 MED ORDER — TRAMADOL HCL 50 MG PO TABS: 50.0000 mg | ORAL_TABLET | Freq: Four times a day (QID) | ORAL | 0 refills | Status: AC | PRN

## 2021-01-30 NOTE — Discharge Instructions (Addendum)
DRAIN INCISION CARE  1.  Remove cover dressing.  May moisten to help remove.  2.  Wash skin around the drain gently with soap & warm tap water.  50% diluted H2O2 peroxide PRN  3.  Cover with dry dressing (gauze or ABD pads) & paper tape.   May reinforce or change outer cover dressing PRN  Teach drain care to patient & family.  Strip (milk fluid inside the tubing) of abdominal Blake drain from skin exit site to bulb x3 strippings/milkings q4h x 2 days, then q shift to help milk away clots/coagulum & prevent the tube from clogging

## 2021-01-30 NOTE — Progress Notes (Signed)
Pt is being d/c home with [facility] PT. Discharge instructions and medication education provided to pt. RN instructed pt and pts daughter how to empty JP drain and how to perform the dressing changes.

## 2021-01-30 NOTE — TOC Transition Note (Signed)
Transition of Care [facility]) - CM/SW Discharge Note   Patient Details  Name: Timothy Khan MRN: [id number] Date of Birth: 1931-04-16  Transition of Care [facility]) CM/SW Contact:  Joanne Chars, LCSW Phone Number: 01/30/2021, 2:29 PM   Clinical Narrative:  Pt discharging with [facility].  No DME needs.  Daughter is in room to transport pt home.  No other needs identified.       Final next level of care: [place] Barriers to Discharge: Barriers Resolved   Patient Goals and CMS Choice Patient states their goals for this hospitalization and ongoing recovery are:: "be better than I was before this all started" CMS Medicare.gov Compare Post Acute Care list provided to:: Patient Choice offered to / list presented to : Patient  Discharge Placement                       Discharge Plan and Services     Post Acute Care Choice: Home Health          DME Arranged: N/A         HH Arranged: PT, OT HH Agency: [facility] Date [place]: 01/30/21 Time [place]: 1253 Representative spoke with at [place]: [place] ([place]) Interventions     Readmission Risk Interventions No flowsheet data found.

## 2021-01-30 NOTE — Discharge Summary (Signed)
Discharge Summary  Timothy Khan IRS:[id number] DOB: [birth year]  PCP: [name], MD  Admit date: 01/25/2021 Discharge date: 01/30/2021  Time spent: 40 mins   Recommendations for Outpatient Follow-up:  General surgery as scheduled PCP in 1 week   Discharge Diagnoses:  Active Hospital Problems   Diagnosis Date Noted   SIRS (systemic inflammatory response syndrome) ([facility]) 01/25/2021   Epigastric pain 01/26/2021   Intractable abdominal pain 01/25/2021   Nausea and vomiting 01/25/2021   BPH (benign prostatic hyperplasia) 06/06/2018   GERD (gastroesophageal reflux disease) 06/06/2018   Hypertension 03/18/2012   Hyperlipidemia 03/18/2012    Resolved Hospital Problems  No resolved problems to display.    Discharge Condition: Stable  Diet recommendation: Soft diet  Vitals:   01/29/21 1955 01/30/21 0402  BP: (!) 146/72 132/73  Pulse: 88 72  Resp: 20 18  Temp: 98.5 F (36.9 C) 98.4 F (36.9 C)  SpO2:  96%    History of present illness:  Timothy Khan is a 87 year old male with past medical history of idiopathic pancreatitis, hyperlipidemia, hypertension, peripheral neuropathy, subdural hematoma, stage III CKD who presented to the ED with epigastric abdominal pain associated with 4 episodes of emesis, felt similar to when he had his pancreatitis episodes. No other new complaints. His caregiver stated that he ate a substantial amount of pistachios in the past few days. In the ED, noted to be tachycardic, otherwise stable, labs showed leukocytosis, LA minimally elevated, lipase WNL otherwise stable.  Chest x-ray unremarkable.  CT abdomen/pelvis without any acute intracranial abdominal process, no evidence of acute pancreatitis, noted cholelithiasis.  Patient admitted for further management.    Today, pt denies any new complaints, denies any worsening abd pain, N/V, fever/chills. Pt able to tolerate diet and has had a BM. Stable from a surgical standpoint to d/c.  Daughter at [facility] Course:  Principal Problem:   SIRS (systemic inflammatory response syndrome) ([facility]) Active Problems:   Hyperlipidemia   Hypertension   BPH (benign prostatic hyperplasia)   GERD (gastroesophageal reflux disease)   Intractable abdominal pain   Nausea and vomiting   Epigastric pain    Sepsis likely 2/2 acute on chronic calculus cholecystitis  Empyema of gallbladder  On admission, tachycardic with leukocytosis Currently afebrile, with resolved leukocytosis BC x2 NGTD CT abdomen/pelvis unremarkable, no evidence of acute pancreatitis, noted gallstones RUQ USS notable for acute cholecystitis General surgery consulted, s/p lap chole and drainage of empyema of gall bladder with drain placed on 01/28/21 S/P IV Zosyn, discussed with Gen surg Dr Marlou Starks, recommended another 5 days of PO Augmentin and drain to be kept in place Outpatient follow up with surgery as scheduled   GERD Continue PPI   Hypertension Continue amlodipine   Chronic diastolic HF Appears euvolemic Echo shows EF of 65 to 00%, grade 1 diastolic dysfunction   BPH Continue vesicare    Estimated body mass index is 24.39 kg/m as calculated from the following:   Height as of this encounter: 5\' 10"  (1.778 m).   Weight as of this encounter: 77.1 kg.    Procedures: Lap chole on 11/11  Consultations: General surgery  Discharge Exam: BP 132/73 (BP Location: Left Arm)   Pulse 72   Temp 98.4 F (36.9 C) (Oral)   Resp 18   Ht 5\' 10"  (1.778 m)   Wt 77.1 kg   SpO2 96%   BMI 24.39 kg/m   General: NAD  Cardiovascular: S1, S2 present Respiratory: CTAB Abdomen: Soft, mildly  tender, mildly distended, + bowel sounds present, incisions c/d/i Musculoskeletal: No bilateral pedal edema noted Skin: Normal Psychiatry: Normal mood     Discharge Instructions You were cared for by a hospitalist during your hospital stay. If you have any questions about your discharge medications or the  care you received while you were in the hospital after you are discharged, you can call the unit and asked to speak with the hospitalist on call if the hospitalist that took care of you is not available. Once you are discharged, your primary care physician will handle any further medical issues. Please note that NO REFILLS for any discharge medications will be authorized once you are discharged, as it is imperative that you return to your primary care physician (or establish a relationship with a primary care physician if you do not have one) for your aftercare needs so that they can reassess your need for medications and monitor your lab values.   Allergies as of 01/30/2021       Reactions   Doxazosin Mesylate    Other reaction(s): fatigue   Lisinopril    Other reaction(s): cough   Macrolides And Ketolides    Other reaction(s): rash   Codeine Nausea Only        Medication List     TAKE these medications    albuterol 108 (90 Base) MCG/ACT inhaler Commonly known as: VENTOLIN HFA Inhale 2 puffs into the lungs every 6 (six) hours as needed for wheezing or shortness of breath.   amLODipine 5 MG tablet Commonly known as: NORVASC Take 5 mg by mouth at bedtime.   amoxicillin-clavulanate 875-125 MG tablet Commonly known as: Augmentin Take 1 tablet by mouth 2 (two) times daily for 5 days.   B-12 5000 MCG Caps Take 5,000 mcg by mouth daily.   cetirizine 10 MG tablet Commonly known as: ZYRTEC Take 10 mg by mouth at bedtime.   Co Q-10 300 MG Caps Take 300 mg by mouth daily.   esomeprazole 20 MG capsule Commonly known as: NEXIUM Take 20 mg by mouth daily.   fluocinonide 0.05 % external solution Commonly known as: LIDEX Apply 1 application topically daily. 20 drops or 1 ml on the scalp   fluticasone 50 MCG/ACT nasal spray Commonly known as: FLONASE Place 2 sprays into both nostrils at bedtime.   gabapentin 300 MG capsule Commonly known as: NEURONTIN Take 2 capsules (600 mg  total) by mouth at bedtime.   ondansetron 4 MG tablet Commonly known as: ZOFRAN Take 4 mg by mouth 3 (three) times daily as needed for nausea or vomiting.   polyethylene glycol 17 g packet Commonly known as: MiraLax Take 17 g by mouth daily for 7 days.   saccharomyces boulardii 250 MG capsule Commonly known as: FLORASTOR Take 250 mg by mouth 2 (two) times daily.   tamsulosin 0.4 MG Caps capsule Commonly known as: FLOMAX Take 0.4 mg by mouth at bedtime.   traMADol 50 MG tablet Commonly known as: ULTRAM Take 2 tablets (100 mg total) by mouth daily. What changed: when to take this   traMADol 50 MG tablet Commonly known as: Ultram Take 1 tablet (50 mg total) by mouth every 6 (six) hours as needed for up to 7 days. What changed: You were already taking a medication with the same name, and this prescription was added. Make sure you understand how and when to take each.   Vitamin D3 50 MCG (2000 UT) capsule Take 2,000 Units by mouth every other day.  Allergies  Allergen Reactions   Doxazosin Mesylate     Other reaction(s): fatigue   Lisinopril     Other reaction(s): cough   Macrolides And Ketolides     Other reaction(s): rash   Codeine Nausea Only    Follow-up Information     Sur[facility], [place]. Go on 02/08/2021.   Specialty: General Surgery Why: at 9 :45 AM for post-operative follow up, please arrive 30 minutes early. Contact information: [place] [address] [phone]         [name], MD. Schedule an appointment as soon as possible for a visit in 1 week(s).   Specialty: Internal Medicine Contact information: [address] [place] [address] [phone]                  The results of significant diagnostics from this hospitalization (including imaging, microbiology, ancillary and laboratory) are listed below for reference.    Significant Diagnostic Studies: CT ABDOMEN PELVIS W  CONTRAST  Result Date: 01/25/2021 CLINICAL DATA:  Epigastric pain.  History of pancreatitis. EXAM: CT ABDOMEN AND PELVIS WITH CONTRAST TECHNIQUE: Multidetector CT imaging of the abdomen and pelvis was performed using the standard protocol following bolus administration of intravenous contrast. CONTRAST:  32mL OMNIPAQUE IOHEXOL 350 MG/ML SOLN COMPARISON:  CT abdomen pelvis dated Jul 25, 2020. FINDINGS: Lower chest: No acute abnormality. Unchanged mild bibasilar scarring. Hepatobiliary: Multiple simple cysts and subcentimeter low-density lesions within the liver are unchanged. Small gallstones again noted. No gallbladder wall thickening or biliary dilatation. Pancreas: Unremarkable. No pancreatic ductal dilatation or surrounding inflammatory changes. Spleen: Normal in size without focal abnormality. Adrenals/Urinary Tract: Adrenal glands are unremarkable. Kidneys are normal, without renal calculi, focal lesion, or hydronephrosis. Bladder is unremarkable. Stomach/Bowel: Unchanged small hiatal hernia. The stomach is otherwise within normal limits. No bowel wall thickening, distention, or surrounding inflammatory changes. Sigmoid colonic diverticulosis again noted. Normal appendix. Vascular/Lymphatic: Aortic atherosclerosis. No enlarged abdominal or pelvic lymph nodes. Reproductive: Unchanged prostatomegaly with median lobe hypertrophy indenting the bladder base. Other: No abdominal wall hernia or abnormality. No abdominopelvic ascites. No pneumoperitoneum. Musculoskeletal: No acute or significant osseous findings. Unchanged chronic L1 compression deformity. IMPRESSION: 1. No acute intra-abdominal process. No CT evidence of acute pancreatitis. 2. Cholelithiasis. 3. Aortic Atherosclerosis (ICD10-I70.0). Electronically Signed   By: Titus Dubin M.D.   On: 01/25/2021 10:01   DG Chest Portable 1 View  Result Date: 01/25/2021 CLINICAL DATA:  87 year old male with history of epigastric abdominal pain. Pancreatitis.  EXAM: PORTABLE CHEST 1 VIEW COMPARISON:  Chest x-ray 06/13/2018. FINDINGS: Lung volumes are low. No consolidative airspace disease. No pleural effusions. No pneumothorax. No pulmonary nodule or mass noted. Pulmonary vasculature and the cardiomediastinal silhouette are within normal limits. Multiple old healed left-sided rib fractures. IMPRESSION: 1. Low lung volumes without radiographic evidence of acute cardiopulmonary disease. Electronically Signed   By: Vinnie Langton M.D.   On: 01/25/2021 11:31   DG C-Arm 1-60 Min-No Report  Result Date: 01/28/2021 Fluoroscopy was utilized by the requesting physician.  No radiographic interpretation.   ECHOCARDIOGRAM COMPLETE  Result Date: 01/26/2021    ECHOCARDIOGRAM REPORT   Patient Name:   CHAITANYA AMEDEE Date of Exam: 01/26/2021 Medical Rec #:  [id number]            Height:       70.0 in Accession #:    [id number]           Weight:  180.0 lb Date of Birth:  02-18-1932           BSA:          1.996 m Patient Age:    55 years             BP:           141/73 mmHg Patient Gender: M                    HR:           96 bpm. Exam Location:  Inpatient Procedure: 2D Echo, Cardiac Doppler and Color Doppler Indications:    R94.31 Abnormal EKG  History:        Patient has no prior history of Echocardiogram examinations.                 Arrythmias:LBBB; Risk Factors:Hypertension.  Sonographer:    Glo Herring Referring Phys: [id number]  1. Left ventricular ejection fraction, by estimation, is 65 to 70%. The left ventricle has normal function. The left ventricle has no regional wall motion abnormalities. There is mild left ventricular hypertrophy. Left ventricular diastolic parameters are consistent with Grade I diastolic dysfunction (impaired relaxation).  2. Right ventricular systolic function is normal. The right ventricular size is normal. Tricuspid regurgitation signal is inadequate for assessing PA pressure.  3. The mitral valve is  normal in structure. No evidence of mitral valve regurgitation.  4. The aortic valve is tricuspid. Aortic valve regurgitation is not visualized. Mild aortic valve sclerosis is present, with no evidence of aortic valve stenosis.  5. The inferior vena cava is normal in size with greater than 50% respiratory variability, suggesting right atrial pressure of 3 mmHg. FINDINGS  Left Ventricle: Left ventricular ejection fraction, by estimation, is 65 to 70%. The left ventricle has normal function. The left ventricle has no regional wall motion abnormalities. The left ventricular internal cavity size was normal in size. There is  mild left ventricular hypertrophy. Left ventricular diastolic parameters are consistent with Grade I diastolic dysfunction (impaired relaxation). Right Ventricle: The right ventricular size is normal. No increase in right ventricular wall thickness. Right ventricular systolic function is normal. Tricuspid regurgitation signal is inadequate for assessing PA pressure. Left Atrium: Left atrial size was normal in size. Right Atrium: Right atrial size was normal in size. Pericardium: There is no evidence of pericardial effusion. Mitral Valve: The mitral valve is normal in structure. No evidence of mitral valve regurgitation. Tricuspid Valve: The tricuspid valve is normal in structure. Tricuspid valve regurgitation is not demonstrated. Aortic Valve: The aortic valve is tricuspid. Aortic valve regurgitation is not visualized. Mild aortic valve sclerosis is present, with no evidence of aortic valve stenosis. Aortic valve mean gradient measures 3.0 mmHg. Aortic valve peak gradient measures 6.2 mmHg. Aortic valve area, by VTI measures 2.92 cm. Pulmonic Valve: The pulmonic valve was normal in structure. Pulmonic valve regurgitation is not visualized. Aorta: The aortic root is normal in size and structure. Venous: The inferior vena cava is normal in size with greater than 50% respiratory variability,  suggesting right atrial pressure of 3 mmHg. IAS/Shunts: No atrial level shunt detected by color flow Doppler.  LEFT VENTRICLE PLAX 2D LVIDd:         3.50 cm   Diastology LVIDs:         2.10 cm   LV e' medial:    4.78 cm/s LV PW:  1.30 cm   LV E/e' medial:  10.7 LV IVS:        1.30 cm   LV e' lateral:   7.46 cm/s LVOT diam:     1.90 cm   LV E/e' lateral: 6.8 LV SV:         61 LV SV Index:   31 LVOT Area:     2.84 cm  RIGHT VENTRICLE RV Basal diam:  3.00 cm RV S prime:     11.50 cm/s LEFT ATRIUM             Index        RIGHT ATRIUM          Index LA diam:        3.50 cm 1.75 cm/m   RA Area:     8.63 cm LA Vol (A2C):   27.9 ml 13.98 ml/m  RA Volume:   16.00 ml 8.02 ml/m LA Vol (A4C):   23.4 ml 11.72 ml/m LA Biplane Vol: 25.6 ml 12.83 ml/m  AORTIC VALVE                    PULMONIC VALVE AV Area (Vmax):    2.97 cm     PV Vmax:       1.01 m/s AV Area (Vmean):   2.88 cm     PV Peak grad:  4.1 mmHg AV Area (VTI):     2.92 cm AV Vmax:           125.00 cm/s AV Vmean:          85.400 cm/s AV VTI:            0.209 m AV Peak Grad:      6.2 mmHg AV Mean Grad:      3.0 mmHg LVOT Vmax:         131.00 cm/s LVOT Vmean:        86.700 cm/s LVOT VTI:          0.215 m LVOT/AV VTI ratio: 1.03  AORTA Ao Root diam: 3.40 cm Ao Asc diam:  3.40 cm MITRAL VALVE MV Area (PHT): 5.97 cm     SHUNTS MV Decel Time: 127 msec     Systemic VTI:  0.22 m MV E velocity: 51.00 cm/s   Systemic Diam: 1.90 cm MV A velocity: 100.00 cm/s MV E/A ratio:  0.51 Dalton McleanMD Electronically signed by Franki Monte Signature Date/Time: 01/26/2021/4:33:07 PM    Final    US Abdomen Limited RUQ (LIVER/GB)  Result Date: 01/26/2021 CLINICAL DATA:  Abdominal pain. EXAM: ULTRASOUND ABDOMEN LIMITED RIGHT UPPER QUADRANT COMPARISON:  June 12, 2018. FINDINGS: Gallbladder: Cholelithiasis is noted with moderate gallbladder wall thickening measuring 5 mm. Pericholecystic fluid is noted as well as sludge within the gallbladder lumen. No sonographic  Murphy's sign is noted. Common bile duct: Diameter: 6 mm which is within normal limits. Liver: No focal lesion identified. Within normal limits in parenchymal echogenicity. Portal vein is patent on color Doppler imaging with normal direction of blood flow towards the liver. Other: None. IMPRESSION: Cholelithiasis and sludge is noted within gallbladder lumen with moderate gallbladder wall thickening present as well as minimal pericholecystic fluid. These findings concerning for possible cholecystitis. HIDA scan is recommended for further evaluation. Electronically Signed   By: Marijo Conception M.D.   On: 01/26/2021 17:54    Microbiology: Recent Results (from the past 240 hour(s))  Resp Panel by RT-PCR (Flu A&B, Covid) Nasopharyngeal Swab  Status: None   Collection Time: 01/25/21 12:00 PM   Specimen: Nasopharyngeal Swab; Nasopharyngeal(NP) swabs in vial transport medium  Result Value Ref Range Status   SARS Coronavirus 2 by RT PCR NEGATIVE NEGATIVE Final    Comment: (NOTE) SARS-CoV-2 target nucleic acids are NOT DETECTED.  The SARS-CoV-2 RNA is generally detectable in upper respiratory specimens during the acute phase of infection. The lowest concentration of SARS-CoV-2 viral copies this assay can detect is 138 copies/mL. A negative result does not preclude SARS-Cov-2 infection and should not be used as the sole basis for treatment or other patient management decisions. A negative result may occur with  improper specimen collection/handling, submission of specimen other than nasopharyngeal swab, presence of viral mutation(s) within the areas targeted by this assay, and inadequate number of viral copies(<138 copies/mL). A negative result must be combined with clinical observations, patient history, and epidemiological information. The expected result is Negative.  Fact Sheet for Patients:  EntrepreneurPulse.com.au  Fact Sheet for Healthcare Providers:   IncredibleEmployment.be  This test is no t yet approved or cleared by the [place] FDA and  has been authorized for detection and/or diagnosis of SARS-CoV-2 by FDA under an Emergency Use Authorization (EUA). This EUA will remain  in effect (meaning this test can be used) for the duration of the COVID-19 declaration under Section 564(b)(1) of the Act, 21 U.S.C.section 360bbb-3(b)(1), unless the authorization is terminated  or revoked sooner.       Influenza A by PCR NEGATIVE NEGATIVE Final   Influenza B by PCR NEGATIVE NEGATIVE Final    Comment: (NOTE) The Xpert Xpress SARS-CoV-2/FLU/RSV plus assay is intended as an aid in the diagnosis of influenza from Nasopharyngeal swab specimens and should not be used as a sole basis for treatment. Nasal washings and aspirates are unacceptable for Xpert Xpress SARS-CoV-2/FLU/RSV testing.  Fact Sheet for Patients: EntrepreneurPulse.com.au  Fact Sheet for Healthcare Providers: IncredibleEmployment.be  This test is not yet approved or cleared by the [place] FDA and has been authorized for detection and/or diagnosis of SARS-CoV-2 by FDA under an Emergency Use Authorization (EUA). This EUA will remain in effect (meaning this test can be used) for the duration of the COVID-19 declaration under Section 564(b)(1) of the Act, 21 U.S.C. section 360bbb-3(b)(1), unless the authorization is terminated or revoked.  Performed at [facility], [place] [address], [place], [place] [address]   Blood culture (routine x 2)     Status: None   Collection Time: 01/25/21 12:10 PM   Specimen: BLOOD  Result Value Ref Range Status   Specimen Description   Final    BLOOD RIGHT ANTECUBITAL Performed at [facility] [place] [address] [place], [place] [address]    Special Requests   Final    BOTTLES DRAWN AEROBIC AND ANAEROBIC Blood Culture adequate  volume Performed at [place] [address] [place], [place] [address]    Culture   Final    NO GROWTH 5 DAYS Performed at [facility], [place] [address], [place], [place] [address]    Report Status 01/30/2021 FINAL  Final  Blood culture (routine x 2)     Status: None   Collection Time: 01/25/21 12:25 PM   Specimen: BLOOD  Result Value Ref Range Status   Specimen Description   Final    BLOOD LEFT ANTECUBITAL Performed at [place] [facility] [address] [place], [place] [address]    Special Requests   Final  BOTTLES DRAWN AEROBIC AND ANAEROBIC Blood Culture adequate volume Performed at [place] [address], [place], [place] [address]    Culture   Final    NO GROWTH 5 DAYS Performed at [facility], [place] [address] [place], [place] [address]    Report Status 01/30/2021 FINAL  Final     Labs: Basic Metabolic Panel: Recent Labs  Lab 01/25/21 0631 01/26/21 0515 01/27/21 0507 01/28/21 0452 01/29/21 0546 01/30/21 0538  NA 135 136 137 136 135 139  K 4.3 4.2 4.1 4.0 4.1 3.7  CL 97* 102 105 106 101 106  CO2 26 25 28  21* 26 28  GLUCOSE 175* 124* 94 87 119* 100*  BUN 21 17 19 15 17 15   CREATININE 1.20 1.21 1.28* 1.07 1.23 1.33*  CALCIUM 8.9 8.1* 8.1* 8.1* 8.3* 7.8*  MG 2.2  --   --   --  2.2  --   PHOS  --   --   --   --  4.1  --    Liver Function Tests: Recent Labs  Lab 01/26/21 0515 01/27/21 0507 01/28/21 0452 01/29/21 0546 01/30/21 0538  AST 34 22 20 94* 45*  ALT 34 28 24 109* 81*  ALKPHOS 53 45 45 58 58  BILITOT 1.7* 1.5* 1.1 1.0 0.6  PROT 6.9 6.1* 6.2* 6.4* 6.0*  ALBUMIN 3.5 3.2* 3.1* 3.2* 3.0*   Recent Labs  Lab 01/25/21 0631  LIPASE 32   No results for input(s): AMMONIA in the last 168 hours. CBC: Recent Labs  Lab 01/25/21 0631 01/26/21 0515 01/27/21 0507 01/28/21 0452 01/29/21 0546 01/30/21 0538  WBC 13.2* 18.5* 11.0* 8.2 13.1* 10.0  NEUTROABS 11.8*  --  7.7 5.8  11.1* 6.9  HGB 16.1 15.6 12.7* 12.5* 12.8* 12.2*  HCT 48.9 46.6 38.7* 38.9* 38.6* 37.5*  MCV 86.2 86.3 89.6 90.3 87.7 89.1  PLT 235 228 179 192 230 233   Cardiac Enzymes: No results for input(s): CKTOTAL, CKMB, CKMBINDEX, TROPONINI in the last 168 hours. BNP: BNP (last 3 results) Recent Labs    01/26/21 0934  BNP 97.9    ProBNP (last 3 results) No results for input(s): PROBNP in the last 8760 hours.  CBG: No results for input(s): GLUCAP in the last 168 hours.     Signed:  Alma Friendly, MD [facility] 01/30/2021, 2:00 PM

## 2021-01-30 NOTE — Progress Notes (Signed)
2 Days Post-Op   Subjective/Chief Complaint: No complaints other than some soreness.    Objective: Vital signs in last 24 hours: Temp:  [97.7 F (36.5 C)-98.5 F (36.9 C)] 98.4 F (36.9 C) (11/13 0402) Pulse Rate:  [72-88] 72 (11/13 0402) Resp:  [18-20] 18 (11/13 0402) BP: (132-146)/(69-73) 132/73 (11/13 0402) SpO2:  [96 %-100 %] 96 % (11/13 0402) Last BM Date: 01/26/21  Intake/Output from previous day: 11/12 0701 - 11/13 0700 In: 846.9 [P.O.:720; IV Piggyback:126.9] Out: 2635 [Urine:2525; Drains:110] Intake/Output this shift: No intake/output data recorded.  General appearance: alert and cooperative Resp: clear to auscultation bilaterally Cardio: regular rate and rhythm GI: soft, minimal tenderness. Drain output serosanguinous  Lab Results:  Recent Labs    01/29/21 0546 01/30/21 0538  WBC 13.1* 10.0  HGB 12.8* 12.2*  HCT 38.6* 37.5*  PLT 230 233   BMET Recent Labs    01/29/21 0546 01/30/21 0538  NA 135 139  K 4.1 3.7  CL 101 106  CO2 26 28  GLUCOSE 119* 100*  BUN 17 15  CREATININE 1.23 1.33*  CALCIUM 8.3* 7.8*   PT/INR No results for input(s): LABPROT, INR in the last 72 hours. ABG No results for input(s): PHART, HCO3 in the last 72 hours.  Invalid input(s): PCO2, PO2  Studies/Results: DG C-Arm 1-60 Min-No Report  Result Date: 01/28/2021 Fluoroscopy was utilized by the requesting physician.  No radiographic interpretation.    Anti-infectives: Anti-infectives (From admission, onward)    Start     Dose/Rate Route Frequency Ordered Stop   01/28/21 1200  piperacillin-tazobactam (ZOSYN) IVPB 3.375 g        3.375 g 12.5 mL/hr over 240 Minutes Intravenous Every 8 hours 01/28/21 1105     01/28/21 1145  piperacillin-tazobactam (ZOSYN) IVPB 3.375 g  Status:  Discontinued        3.375 g 12.5 mL/hr over 240 Minutes Intravenous Every 8 hours 01/28/21 1052 01/28/21 1105   01/28/21 0900  metroNIDAZOLE (FLAGYL) IVPB 500 mg       Note to Pharmacy: Send to  OR.  Thanks!   500 mg 100 mL/hr over 60 Minutes Intravenous  Once 01/28/21 0851 01/28/21 1030   01/27/21 1130  cefTRIAXone (ROCEPHIN) 2 g in sodium chloride 0.9 % 100 mL IVPB  Status:  Discontinued        2 g 200 mL/hr over 30 Minutes Intravenous On call to O.R. 01/27/21 1038 01/28/21 0559       Assessment/Plan: s/p Procedure(s): Laparscopic Cholecystectomy;  Drainage of empyema of gallbladder; Oversew of cystic duct with omentopexy (N/A) Advance diet Will need to keep drain for at least a week Ambulate Seems stable for discharge from surgical standpoint  LOS: 4 days    Timothy Khan 01/30/2021

## 2021-01-30 NOTE — TOC Initial Note (Signed)
Transition of Care [facility]) - Initial/Assessment Note    Patient Details  Name: Timothy Khan MRN: [id number] Date of Birth: 1931/06/16  Transition of Care [facility]) CM/SW Contact:    Joanne Chars, LCSW Phone Number: 01/30/2021, 12:14 PM  Clinical Narrative:   CSW met with pt and daughter to discuss DC recommendation for [facility].  Permission given to speak with out of town daughter present (did not get name), permission also given to speak with two local daughters:     Santiago Glad and Rip Harbour.  Pt agreeable to [facility], choice document given.  Pt has worked with [facility] Lemmings in past and would like to [employer] again.  PCP in place.  Current DME in home: walker, cane, shower chair.  Pt is vaccinated for covid with 2 boosters.              Expected Discharge Plan: [place] Barriers to Discharge: Continued Medical Work up   Patient Goals and CMS Choice Patient states their goals for this hospitalization and ongoing recovery are:: "be better than I was before this all started" CMS Medicare.gov Compare Post Acute Care list provided to:: Patient Choice offered to / list presented to : Patient  Expected Discharge Plan and Services Expected Discharge Plan: [place] Choice: [place] arrangements for the past 2 months: Single Family Home                                      Prior Living Arrangements/Services Living arrangements for the past 2 months: Single Family Home Lives with:: Spouse Patient language and need for interpreter reviewed:: Yes Do you feel safe going back to the place where you live?: Yes      Need for Family Participation in Patient Care: No (Comment) Care giver support system in place?: Yes (comment) Current home services: Other (comment) (none) Criminal Activity/Legal Involvement Pertinent to Current Situation/Hospitalization: No - Comment as needed  Activities of Daily Living Home Assistive  Devices/Equipment: Cane (specify quad or straight), CPAP, Shower chair with back, Dentures (specify type), Eyeglasses, Walker (specify type), Bedside commode/3-in-1, Other (Comment) (tub/shower unit, front and 4 wheeled walker, standard height toilet, "Hurricane" cane, upper/lower dentures) ADL Screening (condition at time of admission) Patient's cognitive ability adequate to safely complete daily activities?: Yes Is the patient deaf or have difficulty hearing?: No Does the patient have difficulty seeing, even when wearing glasses/contacts?: No Does the patient have difficulty concentrating, remembering, or making decisions?: Yes Patient able to express need for assistance with ADLs?: Yes Does the patient have difficulty dressing or bathing?: Yes Independently performs ADLs?: No Communication: Independent Dressing (OT): Needs assistance Is this a change from baseline?: Pre-admission baseline Grooming: Independent Feeding: Independent Bathing: Needs assistance Is this a change from baseline?: Pre-admission baseline Toileting: Needs assistance Is this a change from baseline?: Pre-admission baseline In/Out Bed: Needs assistance Is this a change from baseline?: Pre-admission baseline Walks in Home: Needs assistance Is this a change from baseline?: Pre-admission baseline Does the patient have difficulty walking or climbing stairs?: Yes (secondary to weakness) Weakness of Legs: Both (patient has bilateral neuropathy in feet) Weakness of Arms/Hands: None  Permission Sought/Granted Permission sought to share information with : Family Supports Permission granted to share information with : Yes, Verbal Permission Granted  Share Information with NAME: daughters: Santiago Glad and Rand granted to  share info w AGENCY: HH        Emotional Assessment Appearance:: Appears stated age Attitude/Demeanor/Rapport: Engaged Affect (typically observed): Appropriate, Pleasant Orientation: :  Oriented to Self, Oriented to Place, Oriented to  Time, Oriented to Situation Alcohol / Substance Use: Not Applicable Psych Involvement: No (comment)  Admission diagnosis:  Epigastric pain [R10.13] SIRS (systemic inflammatory response syndrome) (HCC) [R65.10] Patient Active Problem List   Diagnosis Date Noted   Epigastric pain 01/26/2021   SIRS (systemic inflammatory response syndrome) ([place]) 01/25/2021   Intractable abdominal pain 01/25/2021   Nausea and vomiting 01/25/2021   OSA (obstructive sleep apnea) 07/25/2020   Acute-on-chronic kidney injury ([place]) 07/25/2020   Acute pancreatitis 06/06/2018   BPH (benign prostatic hyperplasia) 06/06/2018   LBBB (left bundle branch block) 06/06/2018   GERD (gastroesophageal reflux disease) 06/06/2018   Esophageal thickening 06/06/2018   Hiatal hernia 06/06/2018   Foot drop, bilateral 05/02/2013   SDH (subdural hematoma) 04/30/2013   Polyneuropathy 04/30/2013   Skull fracture ([place]) 04/30/2013   Thoracic or lumbosacral neuritis or radiculitis, unspecified 03/18/2012   Hereditary and idiopathic peripheral neuropathy 03/18/2012   Abnormality of gait 03/18/2012   Concussion with no loss of consciousness 03/18/2012   Hyperlipidemia 03/18/2012   Hypertension 03/18/2012   PCP:  [name], MD Pharmacy:   [facility] - [place], [place] - [address] [place] [address] Phone: [phone] Fax: [phone]     Social Determinants of Health (SDOH) Interventions    Readmission Risk Interventions No flowsheet data found.

## 2021-01-30 NOTE — Plan of Care (Signed)
  Problem: Clinical Measurements: Goal: Will remain free from infection Outcome: Not Progressing   Problem: Elimination: Goal: Will not experience complications related to bowel motility Outcome: Not Progressing   Problem: Elimination: Goal: Will not experience complications related to urinary retention Outcome: Not Progressing   Problem: Pain Managment: Goal: General experience of comfort will improve Outcome: Not Progressing

## 2021-01-31 LAB — SURGICAL PATHOLOGY

## 2021-02-01 DIAGNOSIS — Z79899 Other long term (current) drug therapy: Secondary | ICD-10-CM | POA: Diagnosis not present

## 2021-02-01 DIAGNOSIS — M170 Bilateral primary osteoarthritis of knee: Secondary | ICD-10-CM | POA: Diagnosis not present

## 2021-02-01 DIAGNOSIS — Z9181 History of falling: Secondary | ICD-10-CM | POA: Diagnosis not present

## 2021-02-01 DIAGNOSIS — I700 Atherosclerosis of aorta: Secondary | ICD-10-CM | POA: Diagnosis not present

## 2021-02-01 DIAGNOSIS — I5032 Chronic diastolic (congestive) heart failure: Secondary | ICD-10-CM | POA: Diagnosis not present

## 2021-02-01 DIAGNOSIS — I130 Hypertensive heart and chronic kidney disease with heart failure and stage 1 through stage 4 chronic kidney disease, or unspecified chronic kidney disease: Secondary | ICD-10-CM | POA: Diagnosis not present

## 2021-02-01 DIAGNOSIS — Z79891 Long term (current) use of opiate analgesic: Secondary | ICD-10-CM | POA: Diagnosis not present

## 2021-02-01 DIAGNOSIS — I10 Essential (primary) hypertension: Secondary | ICD-10-CM | POA: Diagnosis not present

## 2021-02-01 DIAGNOSIS — D72829 Elevated white blood cell count, unspecified: Secondary | ICD-10-CM | POA: Diagnosis not present

## 2021-02-01 DIAGNOSIS — N1831 Chronic kidney disease, stage 3a: Secondary | ICD-10-CM | POA: Diagnosis not present

## 2021-02-01 DIAGNOSIS — Z792 Long term (current) use of antibiotics: Secondary | ICD-10-CM | POA: Diagnosis not present

## 2021-02-01 DIAGNOSIS — K449 Diaphragmatic hernia without obstruction or gangrene: Secondary | ICD-10-CM | POA: Diagnosis not present

## 2021-02-01 DIAGNOSIS — G629 Polyneuropathy, unspecified: Secondary | ICD-10-CM | POA: Diagnosis not present

## 2021-02-01 DIAGNOSIS — N183 Chronic kidney disease, stage 3 unspecified: Secondary | ICD-10-CM | POA: Diagnosis not present

## 2021-02-01 DIAGNOSIS — K219 Gastro-esophageal reflux disease without esophagitis: Secondary | ICD-10-CM | POA: Diagnosis not present

## 2021-02-01 DIAGNOSIS — E782 Mixed hyperlipidemia: Secondary | ICD-10-CM | POA: Diagnosis not present

## 2021-02-01 DIAGNOSIS — Z48815 Encounter for surgical aftercare following surgery on the digestive system: Secondary | ICD-10-CM | POA: Diagnosis not present

## 2021-02-01 DIAGNOSIS — K5730 Diverticulosis of large intestine without perforation or abscess without bleeding: Secondary | ICD-10-CM | POA: Diagnosis not present

## 2021-02-01 DIAGNOSIS — E785 Hyperlipidemia, unspecified: Secondary | ICD-10-CM | POA: Diagnosis not present

## 2021-02-01 DIAGNOSIS — Z9049 Acquired absence of other specified parts of digestive tract: Secondary | ICD-10-CM | POA: Diagnosis not present

## 2021-02-01 DIAGNOSIS — E781 Pure hyperglyceridemia: Secondary | ICD-10-CM | POA: Diagnosis not present

## 2021-02-01 DIAGNOSIS — I082 Rheumatic disorders of both aortic and tricuspid valves: Secondary | ICD-10-CM | POA: Diagnosis not present

## 2021-02-01 DIAGNOSIS — G8929 Other chronic pain: Secondary | ICD-10-CM | POA: Diagnosis not present

## 2021-02-01 DIAGNOSIS — Z9981 Dependence on supplemental oxygen: Secondary | ICD-10-CM | POA: Diagnosis not present

## 2021-02-01 DIAGNOSIS — I129 Hypertensive chronic kidney disease with stage 1 through stage 4 chronic kidney disease, or unspecified chronic kidney disease: Secondary | ICD-10-CM | POA: Diagnosis not present

## 2021-02-04 DIAGNOSIS — D72829 Elevated white blood cell count, unspecified: Secondary | ICD-10-CM | POA: Diagnosis not present

## 2021-02-04 DIAGNOSIS — K5730 Diverticulosis of large intestine without perforation or abscess without bleeding: Secondary | ICD-10-CM | POA: Diagnosis not present

## 2021-02-04 DIAGNOSIS — K449 Diaphragmatic hernia without obstruction or gangrene: Secondary | ICD-10-CM | POA: Diagnosis not present

## 2021-02-04 DIAGNOSIS — K5903 Drug induced constipation: Secondary | ICD-10-CM | POA: Diagnosis not present

## 2021-02-04 DIAGNOSIS — Z9981 Dependence on supplemental oxygen: Secondary | ICD-10-CM | POA: Diagnosis not present

## 2021-02-04 DIAGNOSIS — Z792 Long term (current) use of antibiotics: Secondary | ICD-10-CM | POA: Diagnosis not present

## 2021-02-04 DIAGNOSIS — I5032 Chronic diastolic (congestive) heart failure: Secondary | ICD-10-CM | POA: Diagnosis not present

## 2021-02-04 DIAGNOSIS — E785 Hyperlipidemia, unspecified: Secondary | ICD-10-CM | POA: Diagnosis not present

## 2021-02-04 DIAGNOSIS — I700 Atherosclerosis of aorta: Secondary | ICD-10-CM | POA: Diagnosis not present

## 2021-02-04 DIAGNOSIS — G629 Polyneuropathy, unspecified: Secondary | ICD-10-CM | POA: Diagnosis not present

## 2021-02-04 DIAGNOSIS — Z79891 Long term (current) use of opiate analgesic: Secondary | ICD-10-CM | POA: Diagnosis not present

## 2021-02-04 DIAGNOSIS — K219 Gastro-esophageal reflux disease without esophagitis: Secondary | ICD-10-CM | POA: Diagnosis not present

## 2021-02-04 DIAGNOSIS — M170 Bilateral primary osteoarthritis of knee: Secondary | ICD-10-CM | POA: Diagnosis not present

## 2021-02-04 DIAGNOSIS — Z79899 Other long term (current) drug therapy: Secondary | ICD-10-CM | POA: Diagnosis not present

## 2021-02-04 DIAGNOSIS — I130 Hypertensive heart and chronic kidney disease with heart failure and stage 1 through stage 4 chronic kidney disease, or unspecified chronic kidney disease: Secondary | ICD-10-CM | POA: Diagnosis not present

## 2021-02-04 DIAGNOSIS — D649 Anemia, unspecified: Secondary | ICD-10-CM | POA: Diagnosis not present

## 2021-02-04 DIAGNOSIS — G4733 Obstructive sleep apnea (adult) (pediatric): Secondary | ICD-10-CM | POA: Diagnosis not present

## 2021-02-04 DIAGNOSIS — Z48815 Encounter for surgical aftercare following surgery on the digestive system: Secondary | ICD-10-CM | POA: Diagnosis not present

## 2021-02-04 DIAGNOSIS — Z9181 History of falling: Secondary | ICD-10-CM | POA: Diagnosis not present

## 2021-02-04 DIAGNOSIS — Z8719 Personal history of other diseases of the digestive system: Secondary | ICD-10-CM | POA: Diagnosis not present

## 2021-02-04 DIAGNOSIS — Z9049 Acquired absence of other specified parts of digestive tract: Secondary | ICD-10-CM | POA: Diagnosis not present

## 2021-02-04 DIAGNOSIS — R21 Rash and other nonspecific skin eruption: Secondary | ICD-10-CM | POA: Diagnosis not present

## 2021-02-04 DIAGNOSIS — N183 Chronic kidney disease, stage 3 unspecified: Secondary | ICD-10-CM | POA: Diagnosis not present

## 2021-02-04 DIAGNOSIS — I082 Rheumatic disorders of both aortic and tricuspid valves: Secondary | ICD-10-CM | POA: Diagnosis not present

## 2021-02-17 ENCOUNTER — Encounter (HOSPITAL_COMMUNITY): Payer: Self-pay | Admitting: Emergency Medicine

## 2021-02-17 ENCOUNTER — Emergency Department (HOSPITAL_COMMUNITY)
Admission: EM | Admit: 2021-02-17 | Discharge: 2021-02-18 | Disposition: A | Discharge status: Home / Self Care | Payer: Medicare Other | Attending: Emergency Medicine | Admitting: Emergency Medicine

## 2021-02-17 ENCOUNTER — Other Ambulatory Visit: Payer: Self-pay

## 2021-02-17 DIAGNOSIS — D72829 Elevated white blood cell count, unspecified: Secondary | ICD-10-CM | POA: Diagnosis not present

## 2021-02-17 DIAGNOSIS — N183 Chronic kidney disease, stage 3 unspecified: Secondary | ICD-10-CM | POA: Diagnosis not present

## 2021-02-17 DIAGNOSIS — K449 Diaphragmatic hernia without obstruction or gangrene: Secondary | ICD-10-CM | POA: Diagnosis not present

## 2021-02-17 DIAGNOSIS — Z79899 Other long term (current) drug therapy: Secondary | ICD-10-CM | POA: Insufficient documentation

## 2021-02-17 DIAGNOSIS — K8689 Other specified diseases of pancreas: Secondary | ICD-10-CM | POA: Diagnosis not present

## 2021-02-17 DIAGNOSIS — Z792 Long term (current) use of antibiotics: Secondary | ICD-10-CM | POA: Diagnosis not present

## 2021-02-17 DIAGNOSIS — I10 Essential (primary) hypertension: Secondary | ICD-10-CM | POA: Insufficient documentation

## 2021-02-17 DIAGNOSIS — I082 Rheumatic disorders of both aortic and tricuspid valves: Secondary | ICD-10-CM | POA: Diagnosis not present

## 2021-02-17 DIAGNOSIS — Z48815 Encounter for surgical aftercare following surgery on the digestive system: Secondary | ICD-10-CM | POA: Diagnosis not present

## 2021-02-17 DIAGNOSIS — I130 Hypertensive heart and chronic kidney disease with heart failure and stage 1 through stage 4 chronic kidney disease, or unspecified chronic kidney disease: Secondary | ICD-10-CM | POA: Diagnosis not present

## 2021-02-17 DIAGNOSIS — R140 Abdominal distension (gaseous): Secondary | ICD-10-CM | POA: Diagnosis not present

## 2021-02-17 DIAGNOSIS — M170 Bilateral primary osteoarthritis of knee: Secondary | ICD-10-CM | POA: Diagnosis not present

## 2021-02-17 DIAGNOSIS — K7689 Other specified diseases of liver: Secondary | ICD-10-CM | POA: Diagnosis not present

## 2021-02-17 DIAGNOSIS — R079 Chest pain, unspecified: Secondary | ICD-10-CM | POA: Diagnosis not present

## 2021-02-17 DIAGNOSIS — I5032 Chronic diastolic (congestive) heart failure: Secondary | ICD-10-CM | POA: Diagnosis not present

## 2021-02-17 DIAGNOSIS — Z85828 Personal history of other malignant neoplasm of skin: Secondary | ICD-10-CM | POA: Insufficient documentation

## 2021-02-17 DIAGNOSIS — K219 Gastro-esophageal reflux disease without esophagitis: Secondary | ICD-10-CM | POA: Diagnosis not present

## 2021-02-17 DIAGNOSIS — Z9049 Acquired absence of other specified parts of digestive tract: Secondary | ICD-10-CM | POA: Diagnosis not present

## 2021-02-17 DIAGNOSIS — R1013 Epigastric pain: Secondary | ICD-10-CM | POA: Insufficient documentation

## 2021-02-17 DIAGNOSIS — E785 Hyperlipidemia, unspecified: Secondary | ICD-10-CM | POA: Diagnosis not present

## 2021-02-17 DIAGNOSIS — I700 Atherosclerosis of aorta: Secondary | ICD-10-CM | POA: Diagnosis not present

## 2021-02-17 DIAGNOSIS — K5730 Diverticulosis of large intestine without perforation or abscess without bleeding: Secondary | ICD-10-CM | POA: Diagnosis not present

## 2021-02-17 DIAGNOSIS — Z9981 Dependence on supplemental oxygen: Secondary | ICD-10-CM | POA: Diagnosis not present

## 2021-02-17 DIAGNOSIS — G629 Polyneuropathy, unspecified: Secondary | ICD-10-CM | POA: Diagnosis not present

## 2021-02-17 DIAGNOSIS — R072 Precordial pain: Secondary | ICD-10-CM | POA: Diagnosis not present

## 2021-02-17 DIAGNOSIS — N3289 Other specified disorders of bladder: Secondary | ICD-10-CM | POA: Diagnosis not present

## 2021-02-17 DIAGNOSIS — Z87891 Personal history of nicotine dependence: Secondary | ICD-10-CM | POA: Diagnosis not present

## 2021-02-17 DIAGNOSIS — R0789 Other chest pain: Secondary | ICD-10-CM | POA: Diagnosis not present

## 2021-02-17 DIAGNOSIS — K5900 Constipation, unspecified: Secondary | ICD-10-CM | POA: Diagnosis not present

## 2021-02-17 DIAGNOSIS — Z79891 Long term (current) use of opiate analgesic: Secondary | ICD-10-CM | POA: Diagnosis not present

## 2021-02-17 DIAGNOSIS — Z9181 History of falling: Secondary | ICD-10-CM | POA: Diagnosis not present

## 2021-02-17 LAB — COMPREHENSIVE METABOLIC PANEL
ALT: 24 U/L (ref 0–44)
AST: 30 U/L (ref 15–41)
Albumin: 3.9 g/dL (ref 3.5–5.0)
Alkaline Phosphatase: 103 U/L (ref 38–126)
Anion gap: 9 (ref 5–15)
BUN: 18 mg/dL (ref 8–23)
CO2: 25 mmol/L (ref 22–32)
Calcium: 8.7 mg/dL — ABNORMAL LOW (ref 8.9–10.3)
Chloride: 101 mmol/L (ref 98–111)
Creatinine, Ser: 1.53 mg/dL — ABNORMAL HIGH (ref 0.61–1.24)
GFR, Estimated: 44 mL/min — ABNORMAL LOW (ref >60)
Glucose, Bld: 123 mg/dL — ABNORMAL HIGH (ref 70–99)
Potassium: 3.9 mmol/L (ref 3.5–5.1)
Sodium: 135 mmol/L (ref 135–145)
Total Bilirubin: 0.4 mg/dL (ref 0.3–1.2)
Total Protein: 7.6 g/dL (ref 6.5–8.1)

## 2021-02-17 LAB — CBC WITH DIFFERENTIAL/PLATELET
Abs Immature Granulocytes: 0.05 10*3/uL (ref 0.00–0.07)
Basophils Absolute: 0.1 10*3/uL (ref 0.0–0.1)
Basophils Relative: 1 %
Eosinophils Absolute: 0.6 10*3/uL — ABNORMAL HIGH (ref 0.0–0.5)
Eosinophils Relative: 7 %
HCT: 42.3 % (ref 39.0–52.0)
Hemoglobin: 13.9 g/dL (ref 13.0–17.0)
Immature Granulocytes: 1 %
Lymphocytes Relative: 27 %
Lymphs Abs: 2.6 10*3/uL (ref 0.7–4.0)
MCH: 29.5 pg (ref 26.0–34.0)
MCHC: 32.9 g/dL (ref 30.0–36.0)
MCV: 89.8 fL (ref 80.0–100.0)
Monocytes Absolute: 0.8 10*3/uL (ref 0.1–1.0)
Monocytes Relative: 8 %
Neutro Abs: 5.5 10*3/uL (ref 1.7–7.7)
Neutrophils Relative %: 56 %
Platelets: 399 10*3/uL (ref 150–400)
RBC: 4.71 MIL/uL (ref 4.22–5.81)
RDW: 12.7 % (ref 11.5–15.5)
WBC: 9.7 10*3/uL (ref 4.0–10.5)
nRBC: 0 % (ref 0.0–0.2)

## 2021-02-17 LAB — LIPASE, BLOOD: Lipase: 35 U/L (ref 11–51)

## 2021-02-17 LAB — TROPONIN I (HIGH SENSITIVITY): Troponin I (High Sensitivity): 5 ng/L (ref <18)

## 2021-02-17 NOTE — ED Triage Notes (Signed)
Pt states he was sent by his doctor for abnormal labs. States he had an episode of non-radiating chest pain today. Denies pain/shortness of breath at this time.

## 2021-02-17 NOTE — ED Provider Notes (Signed)
Emergency Medicine Provider Triage Evaluation Note  Timothy Khan , a 87 y.o. male  was evaluated in triage.  Pt complains of 1 episode of sharp substernal chest pain that lasted this morning for approximately a few minutes, called PCP and was evaluated by them.  Had elevated troponin in office and sent to the ED for further evaluation.  Chest pain resolved without any intervention.  No prior history of cardiac disease, no family history of CAD, no prior history of blood clots, non-smoker.  Patient did have cholecystectomy about a week ago, his first bowel movement postop occurred yesterday without any complications.  He is endorsing some generalized abdominal discomfort.   Review of Systems  Positive: Chest pain, abdominal pain Negative: Fever, nausea, vomiting  Physical Exam  BP (!) 148/73 (BP Location: Right Arm)   Pulse 78   Resp 20   SpO2 95%  Gen:   Awake, no distress   Resp:  Normal effort  MSK:   Moves extremities without difficulty  Other:  Well appearing wound not the abdomen, abdomen is distended with increased bowel signs.   Medical Decision Making  Medically screening exam initiated at 9:28 PM.  Appropriate orders placed.  Timothy Khan was informed that the remainder of the evaluation will be completed by another provider, this initial triage assessment does not replace that evaluation, and the importance of remaining in the ED until their evaluation is complete.     Janeece Fitting, PA-C 02/17/21 2133    Daleen Bo, MD 02/17/21 [phone]

## 2021-02-18 ENCOUNTER — Emergency Department (HOSPITAL_COMMUNITY): Payer: Medicare Other

## 2021-02-18 DIAGNOSIS — K8689 Other specified diseases of pancreas: Secondary | ICD-10-CM | POA: Diagnosis not present

## 2021-02-18 DIAGNOSIS — N3289 Other specified disorders of bladder: Secondary | ICD-10-CM | POA: Diagnosis not present

## 2021-02-18 DIAGNOSIS — K5730 Diverticulosis of large intestine without perforation or abscess without bleeding: Secondary | ICD-10-CM | POA: Diagnosis not present

## 2021-02-18 DIAGNOSIS — R079 Chest pain, unspecified: Secondary | ICD-10-CM | POA: Diagnosis not present

## 2021-02-18 DIAGNOSIS — K7689 Other specified diseases of liver: Secondary | ICD-10-CM | POA: Diagnosis not present

## 2021-02-18 LAB — URINALYSIS, ROUTINE W REFLEX MICROSCOPIC
Bilirubin Urine: NEGATIVE
Glucose, UA: NEGATIVE mg/dL
Hgb urine dipstick: NEGATIVE
Ketones, ur: NEGATIVE mg/dL
Leukocytes,Ua: NEGATIVE
Nitrite: NEGATIVE
Protein, ur: NEGATIVE mg/dL
Specific Gravity, Urine: 1.01 (ref 1.005–1.030)
pH: 6 (ref 5.0–8.0)

## 2021-02-18 LAB — TROPONIN I (HIGH SENSITIVITY): Troponin I (High Sensitivity): 6 ng/L (ref <18)

## 2021-02-18 MED ORDER — IOHEXOL 300 MG/ML  SOLN
100.0000 mL | Freq: Once | INTRAMUSCULAR | Status: AC | PRN
  Administered 2021-02-18: 100 mL via INTRAVENOUS

## 2021-02-18 MED ORDER — SODIUM CHLORIDE 0.9 % IV BOLUS
1000.0000 mL | Freq: Once | INTRAVENOUS | Status: AC
  Administered 2021-02-18: 1000 mL via INTRAVENOUS

## 2021-02-18 NOTE — ED Provider Notes (Signed)
[facility] EMERGENCY DEPARTMENT Provider Note   CSN: [id number] Arrival date & time: 02/17/21  1909     History Chief Complaint  Patient presents with   Chest Pain    Timothy Khan is a 87 y.o. male.  HPI 87 year old male with a history of BPH, basal cell carcinoma, GERD, hyperlipidemia, hypertension, skull fracture, recent laparoscopic cholecystectomy and drainage of bowel bladder empyema on 11/11 presents to the ER with complaints of centralized chest pain.  Patient states that he has been recovering well from his surgery, had a slightly loose bowel movement today.  He was brushing his teeth this morning when he started to feel a sharp stabbing chest pain that did not radiate, with an episode of diaphoresis.  The pain lasted about 10 minutes, patient did not feel presyncopal or lose consciousness.  Pain alleviated on its own and he has not had any episodes of this since then.  He was seen at his PCPs office later on in the day and had a lab test done (question troponin versus CK-MB, patient is seen by [facility] family medicine and thus chart review is limited).  Patient this lapse was elevated he was to come to the ER for further evaluation.  Patient feels back to his baseline.  Denies any chest pain, fevers, chills, shortness of breath.  He has some mild generalized abdominal discomfort but this is not uncommon for he states his abdomen is distended but less distended than before.    Past Medical History:  Diagnosis Date   Abnormal brain MRI    Right side   Basal cell carcinoma of back    BPH (benign prostatic hyperplasia)    Concussion 1950   Baseball injury   Foot drop, bilateral 05/02/2013   Gait disturbance    Gastroesophageal reflux disease    Hyperlipidemia    Hypertension    Lumbago    Peripheral neuropathy    Skull fracture (HCC)    Sleep apnea     Patient Active Problem List   Diagnosis Date Noted   Epigastric pain 01/26/2021   SIRS (systemic  inflammatory response syndrome) ([place]) 01/25/2021   Intractable abdominal pain 01/25/2021   Nausea and vomiting 01/25/2021   OSA (obstructive sleep apnea) 07/25/2020   Acute-on-chronic kidney injury ([place]) 07/25/2020   Acute pancreatitis 06/06/2018   BPH (benign prostatic hyperplasia) 06/06/2018   LBBB (left bundle branch block) 06/06/2018   GERD (gastroesophageal reflux disease) 06/06/2018   Esophageal thickening 06/06/2018   Hiatal hernia 06/06/2018   Foot drop, bilateral 05/02/2013   SDH (subdural hematoma) 04/30/2013   Polyneuropathy 04/30/2013   Skull fracture ([place]) 04/30/2013   Thoracic or lumbosacral neuritis or radiculitis, unspecified 03/18/2012   Hereditary and idiopathic peripheral neuropathy 03/18/2012   Abnormality of gait 03/18/2012   Concussion with no loss of consciousness 03/18/2012   Hyperlipidemia 03/18/2012   Hypertension 03/18/2012    Past Surgical History:  Procedure Laterality Date   BIOPSY  03/01/2020   Procedure: BIOPSY;  Surgeon: Ronnette Juniper, MD;  Location: [facility];  Service: Gastroenterology;;   CATARACT EXTRACTION Bilateral    ESOPHAGEAL MANOMETRY N/A 01/05/2020   Procedure: ESOPHAGEAL MANOMETRY (EM);  Surgeon: Ronnette Juniper, MD;  Location: [facility];  Service: Gastroenterology;  Laterality: N/A;   ESOPHAGOGASTRODUODENOSCOPY (EGD) WITH PROPOFOL N/A 06/11/2018   Procedure: ESOPHAGOGASTRODUODENOSCOPY (EGD) WITH PROPOFOL;  Surgeon: Clarene Essex, MD;  Location: [facility];  Service: Endoscopy;  Laterality: N/A;   ESOPHAGOGASTRODUODENOSCOPY (EGD) WITH PROPOFOL N/A 03/01/2020   Procedure:  ESOPHAGOGASTRODUODENOSCOPY (EGD) WITH PROPOFOL With Botox injections;  Surgeon: Ronnette Juniper, MD;  Location: [facility];  Service: Gastroenterology;  Laterality: N/A;   KNEE SURGERY Left    LAPAROSCOPIC CHOLECYSTECTOMY SINGLE SITE WITH INTRAOPERATIVE CHOLANGIOGRAM N/A 01/28/2021   Procedure: Laparscopic Cholecystectomy;  Drainage of empyema of gallbladder; Oversew of  cystic duct with omentopexy;  Surgeon: Michael Boston, MD;  Location: [facility];  Service: General;  Laterality: N/A;   skin cancer resection     basal cell, [place] carcinoma   SUBMUCOSAL INJECTION  03/01/2020   Procedure: SUBMUCOSAL INJECTION;  Surgeon: Ronnette Juniper, MD;  Location: [facility];  Service: Gastroenterology;;   TONSILLECTOMY     VASECTOMY         Family History  Problem Relation Age of Onset   Cancer Mother        Breast cancer   Heart attack Father    Heart disease Brother     Social History   Tobacco Use   Smoking status: Former    Types: Cigarettes    Quit date: 08/18/1984    Years since quitting: 36.5   Smokeless tobacco: Never  Vaping Use   Vaping Use: Never used  Substance Use Topics   Alcohol use: No   Drug use: No    Home Medications Prior to Admission medications   Medication Sig Start Date End Date Taking? Authorizing Provider  albuterol (PROVENTIL HFA;VENTOLIN HFA) 108 (90 Base) MCG/ACT inhaler Inhale 2 puffs into the lungs every 6 (six) hours as needed for wheezing or shortness of breath. 06/15/18   Purohit, Konrad Dolores, MD  amLODipine (NORVASC) 5 MG tablet Take 5 mg by mouth at bedtime. 06/24/18   [provider]  cetirizine (ZYRTEC) 10 MG tablet Take 10 mg by mouth at bedtime.     [provider]  Cholecalciferol (VITAMIN D3) 2000 UNITS capsule Take 2,000 Units by mouth every other day.     [provider]  Coenzyme Q10 (CO Q-10) 300 MG CAPS Take 300 mg by mouth daily.    [provider]  Cyanocobalamin (B-12) 5000 MCG CAPS Take 5,000 mcg by mouth daily.    [provider]  esomeprazole (NEXIUM) 20 MG capsule Take 20 mg by mouth daily.     [provider]  fluocinonide (LIDEX) 0.05 % external solution Apply 1 application topically daily. 20 drops or 1 ml on the scalp 02/13/20   [provider]  fluticasone (FLONASE) 50 MCG/ACT nasal spray Place 2 sprays into both nostrils at bedtime.  04/16/13    [provider]  gabapentin (NEURONTIN) 300 MG capsule Take 2 capsules (600 mg total) by mouth at bedtime. 12/04/17   Kathrynn Ducking, MD  ondansetron (ZOFRAN) 4 MG tablet Take 4 mg by mouth 3 (three) times daily as needed for nausea or vomiting. 01/05/20   [provider]  saccharomyces boulardii (FLORASTOR) 250 MG capsule Take 250 mg by mouth 2 (two) times daily.    [provider]  tamsulosin (FLOMAX) 0.4 MG CAPS Take 0.4 mg by mouth at bedtime.     [provider]  traMADol (ULTRAM) 50 MG tablet Take 2 tablets (100 mg total) by mouth daily. Patient taking differently: Take 100 mg by mouth at bedtime. 06/15/18   Purohit, Konrad Dolores, MD    Allergies    Doxazosin mesylate, Lisinopril, Macrolides and ketolides, and Codeine  Review of Systems   Review of Systems Ten systems reviewed and are negative for acute change, except as noted in  the HPI.   Physical Exam Updated Vital Signs BP (!) 149/58   Pulse 77   Resp 15   SpO2 96%   Physical Exam Vitals and nursing note reviewed.  Constitutional:      General: He is not in acute distress.    Appearance: He is well-developed.  HENT:     Head: Normocephalic and atraumatic.  Eyes:     Conjunctiva/sclera: Conjunctivae normal.  Cardiovascular:     Rate and Rhythm: Normal rate and regular rhythm.     Pulses:          Radial pulses are 2+ on the right side and 2+ on the left side.     Heart sounds: No murmur heard. Pulmonary:     Effort: Pulmonary effort is normal. No respiratory distress.     Breath sounds: Normal breath sounds.  Chest:     Chest wall: No tenderness.  Abdominal:     Palpations: Abdomen is soft.     Tenderness: There is no abdominal tenderness.     Comments: Abdomen is distended, firm,minimally tender in the epigastric area  Musculoskeletal:        General: No swelling.     Cervical back: Neck supple.     Right lower leg: No tenderness. No edema.     Left lower leg: No tenderness.  No edema.  Skin:    General: Skin is warm and dry.     Capillary Refill: Capillary refill takes less than 2 seconds.     Findings: No erythema.  Neurological:     General: No focal deficit present.     Mental Status: He is alert and oriented to person, place, and time.     Cranial Nerves: No cranial nerve deficit.     Motor: No weakness.  Psychiatric:        Mood and Affect: Mood normal.    ED Results / Procedures / Treatments   Labs (all labs ordered are listed, but only abnormal results are displayed) Labs Reviewed  CBC WITH DIFFERENTIAL/PLATELET - Abnormal; Notable for the following components:      Result Value   Eosinophils Absolute 0.6 (*)    All other components within normal limits  COMPREHENSIVE METABOLIC PANEL - Abnormal; Notable for the following components:   Glucose, Bld 123 (*)    Creatinine, Ser 1.53 (*)    Calcium 8.7 (*)    GFR, Estimated 44 (*)    All other components within normal limits  LIPASE, BLOOD  URINALYSIS, ROUTINE W REFLEX MICROSCOPIC  TROPONIN I (HIGH SENSITIVITY)  TROPONIN I (HIGH SENSITIVITY)    EKG EKG Interpretation  Date/Time:  Thursday February 17 2021 21:23:22 EST Ventricular Rate:  79 PR Interval:  192 QRS Duration: 74 QT Interval:  362 QTC Calculation: 415 R Axis:   1 Text Interpretation: Normal sinus rhythm Nonspecific T wave abnormality Abnormal ECG Confirmed by Ripley Fraise [phone]) on 02/18/2021 12:38:06 AM  Radiology DG Chest Port 1 View  Result Date: 02/18/2021 CLINICAL DATA:  Chest pain EXAM: PORTABLE CHEST 1 VIEW COMPARISON:  01/25/2021 FINDINGS: The heart size and mediastinal contours are within normal limits. Both lungs are clear. The visualized skeletal structures are unremarkable. IMPRESSION: No active disease. Electronically Signed   By: Inez Catalina M.D.   On: 02/18/2021 01:10    Procedures Procedures   Medications Ordered in ED Medications  sodium chloride 0.9 % bolus 1,000 mL (1,000 mLs Intravenous New  Bag/Given 02/18/21 0232)    ED Course  I have reviewed the triage vital signs and the nursing notes.  Pertinent labs & imaging results that were available during my care of the patient were reviewed by me and considered in my medical decision making (see chart for details).    MDM Rules/Calculators/A&P                           87 year old male who presents to the ER with chest pain.  On arrival, he is well-appearing, in no acute distress, alert and oriented x3, resting comfortably in the ER bed.  Slightly hypertensive on arrival with a blood pressure 140/73, however afebrile, not tachycardic, tachypneic or hypoxic.  Overall exam with clear lung sounds, normal neuro exam, minimal epigastric tenderness, abdomen is distended and firm but no guarding, peritoneal signs.  No CVA tenderness.  Labs ordered, reviewed and interpreted by me.  CBC without leukocytosis, stable hemoglobin.  CMP with a uptrending creatinine of 1.53, 2  weeks ago was 1.33.  Delta troponin negative.  Lipase normal.  UA unremarkable.  Chest x-ray without evidence of pneumonia, mediastinal widening.  EKG without any ischemic changes.  Given patient's history of gallbladder empyema and recent surgery, plan for CT of the abdomen/pelvis to rule out complications.  Plan for  1L LR  for fluid resuscitation as well given uptrending creatinine.  On reevaluation, patient is feeling well.  He has had no episodes of chest pain.  CT of the abdomen with expected postoperative changes with no complications.  Unclear etiology of chest pain however low suspicion for ACS, dissection, PE no evidence of postsurgical abscess or other intra-abdominal cause.  Encouraged PCP follow-up and recheck of creatinine.  Discussed return precautions.  He and family at bedside voiced understanding and are agreeable.  Stable for discharge.  This was a shared visit with my supervising physician Dr. Christy Gentles who independently saw and evaluated the patient &  provided guidance in evaluation/management/disposition ,in agreement with care  Final Clinical Impression(s) / ED Diagnoses Final diagnoses:  None    Rx / DC Orders ED Discharge Orders     None        Garald Balding, PA-C 02/18/21 0454    Ripley Fraise, MD 02/18/21 [phone]

## 2021-02-18 NOTE — ED Notes (Signed)
Patient having testing done at this time. 

## 2021-02-18 NOTE — ED Notes (Signed)
Patient changed into clean brief. Condom cath placed on patient.

## 2021-02-18 NOTE — Discharge Instructions (Addendum)
You were evaluated in the Emergency Department and after careful evaluation, we did not find any emergent condition requiring admission or further testing in the hospital.  Your workup today was reassuring.  We did not find any acute life-threatening causes of her chest pain.  Your creatinine, or lab value that represents your renal function was a little bit elevated today, which could be due to dehydration however I do recommend that you follow-up with your primary care doctor to have this rechecked.  Please return to the Emergency Department if you experience any worsening of your condition.Thank you for allowing [name] to be a part of your care.

## 2021-02-22 DIAGNOSIS — M170 Bilateral primary osteoarthritis of knee: Secondary | ICD-10-CM | POA: Diagnosis not present

## 2021-02-22 DIAGNOSIS — I130 Hypertensive heart and chronic kidney disease with heart failure and stage 1 through stage 4 chronic kidney disease, or unspecified chronic kidney disease: Secondary | ICD-10-CM | POA: Diagnosis not present

## 2021-02-22 DIAGNOSIS — K5730 Diverticulosis of large intestine without perforation or abscess without bleeding: Secondary | ICD-10-CM | POA: Diagnosis not present

## 2021-02-22 DIAGNOSIS — Z792 Long term (current) use of antibiotics: Secondary | ICD-10-CM | POA: Diagnosis not present

## 2021-02-22 DIAGNOSIS — Z48815 Encounter for surgical aftercare following surgery on the digestive system: Secondary | ICD-10-CM | POA: Diagnosis not present

## 2021-02-22 DIAGNOSIS — Z9981 Dependence on supplemental oxygen: Secondary | ICD-10-CM | POA: Diagnosis not present

## 2021-02-22 DIAGNOSIS — I082 Rheumatic disorders of both aortic and tricuspid valves: Secondary | ICD-10-CM | POA: Diagnosis not present

## 2021-02-22 DIAGNOSIS — G629 Polyneuropathy, unspecified: Secondary | ICD-10-CM | POA: Diagnosis not present

## 2021-02-22 DIAGNOSIS — D72829 Elevated white blood cell count, unspecified: Secondary | ICD-10-CM | POA: Diagnosis not present

## 2021-02-22 DIAGNOSIS — K219 Gastro-esophageal reflux disease without esophagitis: Secondary | ICD-10-CM | POA: Diagnosis not present

## 2021-02-22 DIAGNOSIS — Z79891 Long term (current) use of opiate analgesic: Secondary | ICD-10-CM | POA: Diagnosis not present

## 2021-02-22 DIAGNOSIS — Z9181 History of falling: Secondary | ICD-10-CM | POA: Diagnosis not present

## 2021-02-22 DIAGNOSIS — I700 Atherosclerosis of aorta: Secondary | ICD-10-CM | POA: Diagnosis not present

## 2021-02-22 DIAGNOSIS — K449 Diaphragmatic hernia without obstruction or gangrene: Secondary | ICD-10-CM | POA: Diagnosis not present

## 2021-02-22 DIAGNOSIS — N183 Chronic kidney disease, stage 3 unspecified: Secondary | ICD-10-CM | POA: Diagnosis not present

## 2021-02-22 DIAGNOSIS — Z79899 Other long term (current) drug therapy: Secondary | ICD-10-CM | POA: Diagnosis not present

## 2021-02-22 DIAGNOSIS — Z9049 Acquired absence of other specified parts of digestive tract: Secondary | ICD-10-CM | POA: Diagnosis not present

## 2021-02-22 DIAGNOSIS — Z961 Presence of intraocular lens: Secondary | ICD-10-CM | POA: Diagnosis not present

## 2021-02-22 DIAGNOSIS — E785 Hyperlipidemia, unspecified: Secondary | ICD-10-CM | POA: Diagnosis not present

## 2021-02-22 DIAGNOSIS — I5032 Chronic diastolic (congestive) heart failure: Secondary | ICD-10-CM | POA: Diagnosis not present

## 2021-02-24 DIAGNOSIS — E785 Hyperlipidemia, unspecified: Secondary | ICD-10-CM | POA: Diagnosis not present

## 2021-02-24 DIAGNOSIS — I700 Atherosclerosis of aorta: Secondary | ICD-10-CM | POA: Diagnosis not present

## 2021-02-24 DIAGNOSIS — N183 Chronic kidney disease, stage 3 unspecified: Secondary | ICD-10-CM | POA: Diagnosis not present

## 2021-02-24 DIAGNOSIS — K5730 Diverticulosis of large intestine without perforation or abscess without bleeding: Secondary | ICD-10-CM | POA: Diagnosis not present

## 2021-02-24 DIAGNOSIS — I130 Hypertensive heart and chronic kidney disease with heart failure and stage 1 through stage 4 chronic kidney disease, or unspecified chronic kidney disease: Secondary | ICD-10-CM | POA: Diagnosis not present

## 2021-02-24 DIAGNOSIS — Z79899 Other long term (current) drug therapy: Secondary | ICD-10-CM | POA: Diagnosis not present

## 2021-02-24 DIAGNOSIS — K219 Gastro-esophageal reflux disease without esophagitis: Secondary | ICD-10-CM | POA: Diagnosis not present

## 2021-02-24 DIAGNOSIS — Z9981 Dependence on supplemental oxygen: Secondary | ICD-10-CM | POA: Diagnosis not present

## 2021-02-24 DIAGNOSIS — G629 Polyneuropathy, unspecified: Secondary | ICD-10-CM | POA: Diagnosis not present

## 2021-02-24 DIAGNOSIS — Z79891 Long term (current) use of opiate analgesic: Secondary | ICD-10-CM | POA: Diagnosis not present

## 2021-02-24 DIAGNOSIS — I082 Rheumatic disorders of both aortic and tricuspid valves: Secondary | ICD-10-CM | POA: Diagnosis not present

## 2021-02-24 DIAGNOSIS — Z792 Long term (current) use of antibiotics: Secondary | ICD-10-CM | POA: Diagnosis not present

## 2021-02-24 DIAGNOSIS — I5032 Chronic diastolic (congestive) heart failure: Secondary | ICD-10-CM | POA: Diagnosis not present

## 2021-02-24 DIAGNOSIS — Z9049 Acquired absence of other specified parts of digestive tract: Secondary | ICD-10-CM | POA: Diagnosis not present

## 2021-02-24 DIAGNOSIS — D72829 Elevated white blood cell count, unspecified: Secondary | ICD-10-CM | POA: Diagnosis not present

## 2021-02-24 DIAGNOSIS — K449 Diaphragmatic hernia without obstruction or gangrene: Secondary | ICD-10-CM | POA: Diagnosis not present

## 2021-02-24 DIAGNOSIS — M170 Bilateral primary osteoarthritis of knee: Secondary | ICD-10-CM | POA: Diagnosis not present

## 2021-02-24 DIAGNOSIS — Z9181 History of falling: Secondary | ICD-10-CM | POA: Diagnosis not present

## 2021-02-24 DIAGNOSIS — Z48815 Encounter for surgical aftercare following surgery on the digestive system: Secondary | ICD-10-CM | POA: Diagnosis not present

## 2021-02-28 DIAGNOSIS — I129 Hypertensive chronic kidney disease with stage 1 through stage 4 chronic kidney disease, or unspecified chronic kidney disease: Secondary | ICD-10-CM | POA: Diagnosis not present

## 2021-02-28 DIAGNOSIS — E781 Pure hyperglyceridemia: Secondary | ICD-10-CM | POA: Diagnosis not present

## 2021-02-28 DIAGNOSIS — K219 Gastro-esophageal reflux disease without esophagitis: Secondary | ICD-10-CM | POA: Diagnosis not present

## 2021-02-28 DIAGNOSIS — I10 Essential (primary) hypertension: Secondary | ICD-10-CM | POA: Diagnosis not present

## 2021-02-28 DIAGNOSIS — E782 Mixed hyperlipidemia: Secondary | ICD-10-CM | POA: Diagnosis not present

## 2021-02-28 DIAGNOSIS — G8929 Other chronic pain: Secondary | ICD-10-CM | POA: Diagnosis not present

## 2021-02-28 DIAGNOSIS — N1831 Chronic kidney disease, stage 3a: Secondary | ICD-10-CM | POA: Diagnosis not present

## 2021-02-28 NOTE — Progress Notes (Signed)
Cardiology Office Note:    Date:  03/01/2021   ID:  Timothy Khan, DOB Mar 04, 1932, MRN [id number]  PCP:  [name], MD  Cardiologist:  None    Referring MD: [name], MD   No chief complaint on file.   History of Present Illness:    Timothy Khan is a 87 y.o. male with a hx of hypertension, hyperlipidemia, OSA, and CKD here for the evaluation of chest pain at the request of Dr. Laurann Montana. Previous office notes reviewed.   He presented to the ED 02/17/21 complaining of centralized chest pain. He was recovering well from his cholecystectomy and drainage of bowel bladder empyema performed 01/28/21. He was shaving when he noticed a stabbing chest pain with associated diaphoresis and shortness of breath lasting about 10 minutes. His caregiver noted her was pale. He described the pain as a squeezing 10 out of 10 pain. The pain dissipated after sitting for 4 minutes and he did not have recurrent episodes. His blood pressure at this time was 140/73.  Extensive ER notes reviewed.  Prior office note from Dr. Laurann Montana reviewed.  He saw Dr. Laurann Montana 02/28/21. It was noted his troponin and creatinine were elevated at 39 and 1.58 respectively.  This is why he went to the ER.  His pain did not return and the area had nor tenderness or pleuritic component. He was referred to cardiology for further evaluation.  Today, he is doing well and is accompanied by his daughter. He was shaving in the morning when he suddenly felt a squeezing pain across his chest. He had to sit for a few minutes and met with his caretaker. He has had issues with his gallbladder and pancreatitis and wondered if the pain was related. He has not had a recurrent chest pain episode since.   His gallbladder was removed before in November. Reportedly, his gall bladder was necrotic and he was short of being septic.   Of note, he has had a catheterization in 2002 with Dr. Jaci Standard. At the time, the results were normal. He  also performed a treadmill stress test in the past. He reports his heart muscle was found to be thick.  He is a former smoker and quit in 1986. His daughter reports she never knew he smoked. His father passed away at 19 years old due to coronary occlusion. He used to run 5 miles daily. He is unable to do so now because he uses a walking cane.   He denies any palpitations, shortness of breath, lightheadedness, headaches, syncope, orthopnea, PND, lower extremity edema or exertional symptoms.  Past Medical History:  Diagnosis Date   Abnormal brain MRI    Right side   Basal cell carcinoma of back    BPH (benign prostatic hyperplasia)    Chronic cough    CKD (chronic kidney disease), stage III ([place])    Concussion 1950   Baseball injury   Diastolic dysfunction    ED (erectile dysfunction)    Foot drop, bilateral 05/02/2013   Gait disorder    Gait disturbance    Gastroesophageal reflux disease    Hyperlipidemia    Hypertension    Libido, decreased    Lumbago    Memory loss    Onychomycosis    OSA (obstructive sleep apnea)    Peripheral neuropathy    PSA elevation    Skull fracture (HCC)    Skull fracture (HCC)    Sleep apnea     Past Surgical History:  Procedure  Laterality Date   BIOPSY  03/01/2020   Procedure: BIOPSY;  Surgeon: Ronnette Juniper, MD;  Location: [facility];  Service: Gastroenterology;;   CATARACT EXTRACTION Bilateral    ESOPHAGEAL MANOMETRY N/A 01/05/2020   Procedure: ESOPHAGEAL MANOMETRY (EM);  Surgeon: Ronnette Juniper, MD;  Location: [facility];  Service: Gastroenterology;  Laterality: N/A;   ESOPHAGOGASTRODUODENOSCOPY (EGD) WITH PROPOFOL N/A 06/11/2018   Procedure: ESOPHAGOGASTRODUODENOSCOPY (EGD) WITH PROPOFOL;  Surgeon: Clarene Essex, MD;  Location: [facility];  Service: Endoscopy;  Laterality: N/A;   ESOPHAGOGASTRODUODENOSCOPY (EGD) WITH PROPOFOL N/A 03/01/2020   Procedure: ESOPHAGOGASTRODUODENOSCOPY (EGD) WITH PROPOFOL With Botox injections;  Surgeon: Ronnette Juniper, MD;  Location: [facility];  Service: Gastroenterology;  Laterality: N/A;   KNEE SURGERY Left    LAPAROSCOPIC CHOLECYSTECTOMY SINGLE SITE WITH INTRAOPERATIVE CHOLANGIOGRAM N/A 01/28/2021   Procedure: Laparscopic Cholecystectomy;  Drainage of empyema of gallbladder; Oversew of cystic duct with omentopexy;  Surgeon: Michael Boston, MD;  Location: [facility];  Service: General;  Laterality: N/A;   skin cancer resection     basal cell, [place] carcinoma   SUBMUCOSAL INJECTION  03/01/2020   Procedure: SUBMUCOSAL INJECTION;  Surgeon: Ronnette Juniper, MD;  Location: [facility];  Service: Gastroenterology;;   TONSILLECTOMY     VASECTOMY      Current Medications: Current Meds  Medication Sig   albuterol (PROVENTIL HFA;VENTOLIN HFA) 108 (90 Base) MCG/ACT inhaler Inhale 2 puffs into the lungs every 6 (six) hours as needed for wheezing or shortness of breath.   amLODipine (NORVASC) 5 MG tablet Take 5 mg by mouth at bedtime.   cetirizine (ZYRTEC) 10 MG tablet Take 10 mg by mouth at bedtime.    Cholecalciferol (VITAMIN D3) 2000 UNITS capsule Take 2,000 Units by mouth every other day.    Coenzyme Q10 (CO Q-10) 300 MG CAPS Take 300 mg by mouth daily.   Cyanocobalamin (B-12) 5000 MCG CAPS Take 5,000 mcg by mouth daily.   esomeprazole (NEXIUM) 20 MG capsule Take 20 mg by mouth daily.    fluocinonide (LIDEX) 0.05 % external solution Apply 1 application topically daily. 20 drops or 1 ml on the scalp   fluticasone (FLONASE) 50 MCG/ACT nasal spray Place 2 sprays into both nostrils at bedtime.    gabapentin (NEURONTIN) 300 MG capsule Take 2 capsules (600 mg total) by mouth at bedtime.   ondansetron (ZOFRAN) 4 MG tablet Take 4 mg by mouth 3 (three) times daily as needed for nausea or vomiting.   saccharomyces boulardii (FLORASTOR) 250 MG capsule Take 250 mg by mouth 2 (two) times daily.   tamsulosin (FLOMAX) 0.4 MG CAPS Take 0.4 mg by mouth at bedtime.    traMADol (ULTRAM) 50 MG tablet Take 2 tablets (100 mg total)  by mouth daily.     Allergies:   Doxazosin mesylate, Lisinopril, Macrolides and ketolides, and Codeine   Social History   Socioeconomic History   Marital status: Married    Spouse name: Not on file   Number of children: 4   Years of education: 18   Highest education level: Not on file  Occupational History   Occupation: Retired  Tobacco Use   Smoking status: Former    Types: Cigarettes    Quit date: 08/18/1984    Years since quitting: 36.5   Smokeless tobacco: Never  Vaping Use   Vaping Use: Never used  Substance and Sexual Activity   Alcohol use: No   Drug use: No   Sexual activity: Not on file  Other Topics Concern   Not  on file  Social History Narrative   Patient states he is ambidextrous but is predominantly left handed.   Patient drinks 3 cups of coffee per day.   Social Determinants of Health   Financial Resource Strain: Not on file  Food Insecurity: Not on file  Transportation Needs: Not on file  Physical Activity: Not on file  Stress: Not on file  Social Connections: Not on file     Family History: The patient's family history includes CAD in his father; Cancer in his mother; Heart attack (age of onset: 23) in his father; Heart disease in his brother; Lupus in his daughter.  ROS:   Please see the history of present illness.    All other systems reviewed and negative.   EKGs/Labs/Other Studies Reviewed:    The following studies were reviewed today: Echo 01/26/21 1. Left ventricular ejection fraction, by estimation, is 65 to 70%. The  left ventricle has normal function. The left ventricle has no regional  wall motion abnormalities. There is mild left ventricular hypertrophy.  Left ventricular diastolic parameters  are consistent with Grade I diastolic dysfunction (impaired relaxation).   2. Right ventricular systolic function is normal. The right ventricular  size is normal. Tricuspid regurgitation signal is inadequate for assessing  PA pressure.   3.  The mitral valve is normal in structure. No evidence of mitral valve  regurgitation.   4. The aortic valve is tricuspid. Aortic valve regurgitation is not  visualized. Mild aortic valve sclerosis is present, with no evidence of  aortic valve stenosis.   5. The inferior vena cava is normal in size with greater than 50%  respiratory variability, suggesting right atrial pressure of 3 mmHg.  Chest X-Ray 01/25/21 IMPRESSION: 1. Low lung volumes without radiographic evidence of acute cardiopulmonary disease.  CT Abdomen 01/25/21 IMPRESSION: 1. No acute intra-abdominal process. No CT evidence of acute pancreatitis. 2. Cholelithiasis. 3. Aortic Atherosclerosis (ICD10-I70.0).  EKG:  EKG was personally reviewed 02/17/21 (ED): NSR, rate 79 bpm, Nonspecific T wave abnormality   Recent Labs: 07/26/2020: TSH 0.698 01/26/2021: B Natriuretic Peptide 97.9 01/29/2021: Magnesium 2.2 02/17/2021: ALT 24; BUN 18; Creatinine, Ser 1.53; Hemoglobin 13.9; Platelets 399; Potassium 3.9; Sodium 135   Recent Lipid Panel    Component Value Date/Time   TRIG 453 (H) 07/25/2020 1945    CHA2DS2-VASc Score =   [ ].  Therefore, the patient's annual risk of stroke is   %.        Physical Exam:    VS:  BP 130/70 (BP Location: Left Arm, Patient Position: Sitting, Cuff Size: Normal)   Pulse 100   Ht 5' 10" (1.778 m)   Wt 178 lb (80.7 kg)   SpO2 96%   BMI 25.54 kg/m     Wt Readings from Last 3 Encounters:  03/01/21 178 lb (80.7 kg)  01/28/21 170 lb (77.1 kg)  07/25/20 180 lb (81.6 kg)     GEN:  Well nourished, well developed in no acute distress HEENT: Normal NECK: No JVD; No carotid bruits LYMPHATICS: No lymphadenopathy CARDIAC: RRR, no murmurs, rubs, gallops RESPIRATORY:  Clear to auscultation without rales, wheezing or rhonchi  ABDOMEN: Soft, non-tender, non-distended MUSCULOSKELETAL:  No edema; No deformity  SKIN: Warm and dry NEUROLOGIC:  Alert and oriented x 3 PSYCHIATRIC:  Normal affect    ASSESSMENT:    1. Precordial pain   2. Chest pain of uncertain etiology   3. Stage 3a chronic kidney disease ([place])   4. Former smoker  5. Family history of early CAD    PLAN:    Chest pain of uncertain etiology Seems to be an isolated episode of concerning chest discomfort banding across chest wall quite severe pale pressure.  In office troponin was 39, in ER it was normal.  EKG did not show any severe ischemic changes.  Nonspecific T wave changes noted.  He is not having any other exertional type symptoms.  20 years ago he had a cardiac catheterization performed by Fabio Asa, MD and it was normal.  I think it makes sense for [name] to proceed with pharmacologic stress test to exclude any high risk ischemia.  His father died at age 58 from a heart attack.  He himself is taking good care of himself in fact less than 10 years ago he was walking/running up to 5 miles a day.  No anginal symptoms prior to this.  He is also just recently had cholecystectomy, pancreatitis.  Certainly some residual symptoms could have developed as it relates to his GI system.  We will follow-up with results of study.  Chronic kidney disease, stage III (moderate) (HCC) Last creatinine 1.58.  Monitoring as outpatient.  Avoid NSAIDs.  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  Family history of early CAD Father 8 died of MI      Medication Adjustments/Labs and Tests Ordered: Current medicines are reviewed at length with the patient today.  Concerns regarding medicines are outlined above.  Orders Placed This Encounter  Procedures   MYOCARDIAL PERFUSION IMAGING    No orders of the defined types were placed in this encounter.  I,Mykaella Javier,acting as a scribe for [employer], MD.,have documented all relevant documentation on the behalf of Candee Furbish, MD,as directed by  Candee Furbish, MD while in the presence of Candee Furbish, MD.  I, Candee Furbish, MD, have reviewed all documentation for this  visit. The documentation on 03/01/21 for the exam, diagnosis, procedures, and orders are all accurate and complete.   Signed, Candee Furbish, MD  03/01/2021 9:13 AM    [facility]

## 2021-03-01 ENCOUNTER — Ambulatory Visit: Payer: Medicare Other | Admitting: Cardiology

## 2021-03-01 ENCOUNTER — Encounter: Payer: Self-pay | Admitting: Cardiology

## 2021-03-01 ENCOUNTER — Other Ambulatory Visit: Payer: Self-pay

## 2021-03-01 ENCOUNTER — Encounter: Payer: Self-pay | Admitting: *Deleted

## 2021-03-01 VITALS — BP 130/70 | HR 100 | Ht 70.0 in | Wt 178.0 lb

## 2021-03-01 DIAGNOSIS — M170 Bilateral primary osteoarthritis of knee: Secondary | ICD-10-CM | POA: Diagnosis not present

## 2021-03-01 DIAGNOSIS — N1831 Chronic kidney disease, stage 3a: Secondary | ICD-10-CM | POA: Diagnosis not present

## 2021-03-01 DIAGNOSIS — Z87891 Personal history of nicotine dependence: Secondary | ICD-10-CM | POA: Diagnosis not present

## 2021-03-01 DIAGNOSIS — E785 Hyperlipidemia, unspecified: Secondary | ICD-10-CM | POA: Diagnosis not present

## 2021-03-01 DIAGNOSIS — Z792 Long term (current) use of antibiotics: Secondary | ICD-10-CM | POA: Diagnosis not present

## 2021-03-01 DIAGNOSIS — G629 Polyneuropathy, unspecified: Secondary | ICD-10-CM | POA: Diagnosis not present

## 2021-03-01 DIAGNOSIS — Z48815 Encounter for surgical aftercare following surgery on the digestive system: Secondary | ICD-10-CM | POA: Diagnosis not present

## 2021-03-01 DIAGNOSIS — I082 Rheumatic disorders of both aortic and tricuspid valves: Secondary | ICD-10-CM | POA: Diagnosis not present

## 2021-03-01 DIAGNOSIS — N183 Chronic kidney disease, stage 3 unspecified: Secondary | ICD-10-CM | POA: Diagnosis not present

## 2021-03-01 DIAGNOSIS — R072 Precordial pain: Secondary | ICD-10-CM | POA: Diagnosis not present

## 2021-03-01 DIAGNOSIS — Z8249 Family history of ischemic heart disease and other diseases of the circulatory system: Secondary | ICD-10-CM

## 2021-03-01 DIAGNOSIS — K449 Diaphragmatic hernia without obstruction or gangrene: Secondary | ICD-10-CM | POA: Diagnosis not present

## 2021-03-01 DIAGNOSIS — I130 Hypertensive heart and chronic kidney disease with heart failure and stage 1 through stage 4 chronic kidney disease, or unspecified chronic kidney disease: Secondary | ICD-10-CM | POA: Diagnosis not present

## 2021-03-01 DIAGNOSIS — Z9181 History of falling: Secondary | ICD-10-CM | POA: Diagnosis not present

## 2021-03-01 DIAGNOSIS — I700 Atherosclerosis of aorta: Secondary | ICD-10-CM | POA: Diagnosis not present

## 2021-03-01 DIAGNOSIS — Z9981 Dependence on supplemental oxygen: Secondary | ICD-10-CM | POA: Diagnosis not present

## 2021-03-01 DIAGNOSIS — I5032 Chronic diastolic (congestive) heart failure: Secondary | ICD-10-CM | POA: Diagnosis not present

## 2021-03-01 DIAGNOSIS — Z9049 Acquired absence of other specified parts of digestive tract: Secondary | ICD-10-CM | POA: Diagnosis not present

## 2021-03-01 DIAGNOSIS — R079 Chest pain, unspecified: Secondary | ICD-10-CM | POA: Insufficient documentation

## 2021-03-01 DIAGNOSIS — Z79899 Other long term (current) drug therapy: Secondary | ICD-10-CM | POA: Diagnosis not present

## 2021-03-01 DIAGNOSIS — N179 Acute kidney failure, unspecified: Secondary | ICD-10-CM | POA: Insufficient documentation

## 2021-03-01 DIAGNOSIS — K5730 Diverticulosis of large intestine without perforation or abscess without bleeding: Secondary | ICD-10-CM | POA: Diagnosis not present

## 2021-03-01 DIAGNOSIS — D72829 Elevated white blood cell count, unspecified: Secondary | ICD-10-CM | POA: Diagnosis not present

## 2021-03-01 DIAGNOSIS — Z79891 Long term (current) use of opiate analgesic: Secondary | ICD-10-CM | POA: Diagnosis not present

## 2021-03-01 DIAGNOSIS — K219 Gastro-esophageal reflux disease without esophagitis: Secondary | ICD-10-CM | POA: Diagnosis not present

## 2021-03-01 NOTE — Patient Instructions (Signed)
Medication Instructions:  The current medical regimen is effective;  continue present plan and medications.  *If you need a refill on your cardiac medications before your next appointment, please call your pharmacy*  Testing/Procedures: Your physician has requested that you have a lexiscan myoview. For further information please visit [email or web address]. Please follow instruction sheet, as given.  Follow-Up: At [facility], you and your health needs are our priority.  As part of our continuing mission to provide you with exceptional heart care, we have created designated Provider Care Teams.  These Care Teams include your primary Cardiologist (physician) and Advanced Practice Providers (APPs -  Physician Assistants and Nurse Practitioners) who all work together to provide you with the care you need, when you need it.  We recommend signing up for the patient portal called "MyChart".  Sign up information is provided on this After Visit Summary.  MyChart is used to connect with patients for Virtual Visits (Telemedicine).  Patients are able to view lab/test results, encounter notes, upcoming appointments, etc.  Non-urgent messages can be sent to your provider as well.   To learn more about what you can do with MyChart, go to [email or web address].    Your next appointment:   Follow up will be based on the results of the above testing.  Thank you for choosing [facility]!!

## 2021-03-01 NOTE — Assessment & Plan Note (Signed)
Seems to be an isolated episode of concerning chest discomfort banding across chest wall quite severe pale pressure.  In office troponin was 39, in ER it was normal.  EKG did not show any severe ischemic changes.  Nonspecific T wave changes noted.  He is not having any other exertional type symptoms.  20 years ago he had a cardiac catheterization performed by Fabio Asa, MD and it was normal.  I think it makes sense for [name] to proceed with pharmacologic stress test to exclude any high risk ischemia.  His father died at age 110 from a heart attack.  He himself is taking good care of himself in fact less than 10 years ago he was walking/running up to 5 miles a day.  No anginal symptoms prior to this.  He is also just recently had cholecystectomy, pancreatitis.  Certainly some residual symptoms could have developed as it relates to his GI system.  We will follow-up with results of study.

## 2021-03-01 NOTE — Assessment & Plan Note (Signed)
Quit 1986.  15-pack-year history.

## 2021-03-01 NOTE — Assessment & Plan Note (Signed)
Last creatinine 1.58.  Monitoring as outpatient.  Avoid NSAIDs.

## 2021-03-01 NOTE — Assessment & Plan Note (Signed)
Father 48 died of MI

## 2021-03-02 ENCOUNTER — Telehealth (HOSPITAL_COMMUNITY): Payer: Self-pay | Admitting: *Deleted

## 2021-03-02 NOTE — Telephone Encounter (Signed)
Patient given detailed instructions per Myocardial Perfusion Study Information Sheet for the test on 03/09/21 at 0800.Patient notified to arrive 15 minutes early and that it is imperative to arrive on time for appointment to keep from having the test rescheduled.  If you need to cancel or reschedule your appointment, please call the office within 24 hours of your appointment. . Patient verbalized understanding.Minah Axelrod, Ranae Palms

## 2021-03-03 DIAGNOSIS — Z9181 History of falling: Secondary | ICD-10-CM | POA: Diagnosis not present

## 2021-03-03 DIAGNOSIS — K219 Gastro-esophageal reflux disease without esophagitis: Secondary | ICD-10-CM | POA: Diagnosis not present

## 2021-03-03 DIAGNOSIS — I5032 Chronic diastolic (congestive) heart failure: Secondary | ICD-10-CM | POA: Diagnosis not present

## 2021-03-03 DIAGNOSIS — K449 Diaphragmatic hernia without obstruction or gangrene: Secondary | ICD-10-CM | POA: Diagnosis not present

## 2021-03-03 DIAGNOSIS — Z79891 Long term (current) use of opiate analgesic: Secondary | ICD-10-CM | POA: Diagnosis not present

## 2021-03-03 DIAGNOSIS — E785 Hyperlipidemia, unspecified: Secondary | ICD-10-CM | POA: Diagnosis not present

## 2021-03-03 DIAGNOSIS — G629 Polyneuropathy, unspecified: Secondary | ICD-10-CM | POA: Diagnosis not present

## 2021-03-03 DIAGNOSIS — Z79899 Other long term (current) drug therapy: Secondary | ICD-10-CM | POA: Diagnosis not present

## 2021-03-03 DIAGNOSIS — Z9981 Dependence on supplemental oxygen: Secondary | ICD-10-CM | POA: Diagnosis not present

## 2021-03-03 DIAGNOSIS — D72829 Elevated white blood cell count, unspecified: Secondary | ICD-10-CM | POA: Diagnosis not present

## 2021-03-03 DIAGNOSIS — M170 Bilateral primary osteoarthritis of knee: Secondary | ICD-10-CM | POA: Diagnosis not present

## 2021-03-03 DIAGNOSIS — N183 Chronic kidney disease, stage 3 unspecified: Secondary | ICD-10-CM | POA: Diagnosis not present

## 2021-03-03 DIAGNOSIS — Z792 Long term (current) use of antibiotics: Secondary | ICD-10-CM | POA: Diagnosis not present

## 2021-03-03 DIAGNOSIS — Z9049 Acquired absence of other specified parts of digestive tract: Secondary | ICD-10-CM | POA: Diagnosis not present

## 2021-03-03 DIAGNOSIS — I130 Hypertensive heart and chronic kidney disease with heart failure and stage 1 through stage 4 chronic kidney disease, or unspecified chronic kidney disease: Secondary | ICD-10-CM | POA: Diagnosis not present

## 2021-03-03 DIAGNOSIS — K5730 Diverticulosis of large intestine without perforation or abscess without bleeding: Secondary | ICD-10-CM | POA: Diagnosis not present

## 2021-03-03 DIAGNOSIS — Z48815 Encounter for surgical aftercare following surgery on the digestive system: Secondary | ICD-10-CM | POA: Diagnosis not present

## 2021-03-03 DIAGNOSIS — I700 Atherosclerosis of aorta: Secondary | ICD-10-CM | POA: Diagnosis not present

## 2021-03-03 DIAGNOSIS — I082 Rheumatic disorders of both aortic and tricuspid valves: Secondary | ICD-10-CM | POA: Diagnosis not present

## 2021-03-06 DIAGNOSIS — G4733 Obstructive sleep apnea (adult) (pediatric): Secondary | ICD-10-CM | POA: Diagnosis not present

## 2021-03-09 ENCOUNTER — Encounter (HOSPITAL_COMMUNITY): Payer: Medicare Other

## 2021-03-09 ENCOUNTER — Telehealth (HOSPITAL_COMMUNITY): Payer: Self-pay | Admitting: *Deleted

## 2021-03-09 ENCOUNTER — Encounter (HOSPITAL_COMMUNITY): Payer: Self-pay | Admitting: *Deleted

## 2021-03-09 NOTE — Telephone Encounter (Signed)
Left message on voicemail in reference to upcoming appointment scheduled for 03/17/21 Phone number given for a call back so details instructions can be given.  Letter sent with instructions.  Kirstie Peri

## 2021-03-12 ENCOUNTER — Emergency Department (HOSPITAL_COMMUNITY): Payer: Medicare Other

## 2021-03-12 ENCOUNTER — Other Ambulatory Visit: Payer: Self-pay

## 2021-03-12 ENCOUNTER — Inpatient Hospital Stay (HOSPITAL_COMMUNITY)
Admission: EM | Admit: 2021-03-12 | Discharge: 2021-03-15 | DRG: 871 | Disposition: A | Discharge status: Home / Self Care | Payer: Medicare Other | Attending: Internal Medicine | Admitting: Internal Medicine

## 2021-03-12 ENCOUNTER — Encounter (HOSPITAL_COMMUNITY): Payer: Self-pay | Admitting: Emergency Medicine

## 2021-03-12 ENCOUNTER — Inpatient Hospital Stay (HOSPITAL_COMMUNITY): Payer: Medicare Other

## 2021-03-12 DIAGNOSIS — N400 Enlarged prostate without lower urinary tract symptoms: Secondary | ICD-10-CM | POA: Diagnosis present

## 2021-03-12 DIAGNOSIS — Z8249 Family history of ischemic heart disease and other diseases of the circulatory system: Secondary | ICD-10-CM

## 2021-03-12 DIAGNOSIS — Z8673 Personal history of transient ischemic attack (TIA), and cerebral infarction without residual deficits: Secondary | ICD-10-CM

## 2021-03-12 DIAGNOSIS — R000 Tachycardia, unspecified: Secondary | ICD-10-CM | POA: Diagnosis not present

## 2021-03-12 DIAGNOSIS — R0902 Hypoxemia: Secondary | ICD-10-CM | POA: Diagnosis not present

## 2021-03-12 DIAGNOSIS — R7989 Other specified abnormal findings of blood chemistry: Secondary | ICD-10-CM

## 2021-03-12 DIAGNOSIS — K859 Acute pancreatitis without necrosis or infection, unspecified: Secondary | ICD-10-CM | POA: Diagnosis present

## 2021-03-12 DIAGNOSIS — Z20822 Contact with and (suspected) exposure to covid-19: Secondary | ICD-10-CM | POA: Diagnosis not present

## 2021-03-12 DIAGNOSIS — R6520 Severe sepsis without septic shock: Secondary | ICD-10-CM | POA: Diagnosis not present

## 2021-03-12 DIAGNOSIS — R1084 Generalized abdominal pain: Secondary | ICD-10-CM | POA: Diagnosis not present

## 2021-03-12 DIAGNOSIS — Z9049 Acquired absence of other specified parts of digestive tract: Secondary | ICD-10-CM | POA: Diagnosis not present

## 2021-03-12 DIAGNOSIS — G9341 Metabolic encephalopathy: Secondary | ICD-10-CM

## 2021-03-12 DIAGNOSIS — Z85828 Personal history of other malignant neoplasm of skin: Secondary | ICD-10-CM | POA: Diagnosis not present

## 2021-03-12 DIAGNOSIS — R509 Fever, unspecified: Secondary | ICD-10-CM | POA: Diagnosis not present

## 2021-03-12 DIAGNOSIS — G4733 Obstructive sleep apnea (adult) (pediatric): Secondary | ICD-10-CM | POA: Diagnosis not present

## 2021-03-12 DIAGNOSIS — R410 Disorientation, unspecified: Secondary | ICD-10-CM | POA: Diagnosis not present

## 2021-03-12 DIAGNOSIS — J9811 Atelectasis: Secondary | ICD-10-CM | POA: Diagnosis not present

## 2021-03-12 DIAGNOSIS — D72828 Other elevated white blood cell count: Secondary | ICD-10-CM | POA: Diagnosis not present

## 2021-03-12 DIAGNOSIS — R7401 Elevation of levels of liver transaminase levels: Secondary | ICD-10-CM

## 2021-03-12 DIAGNOSIS — N179 Acute kidney failure, unspecified: Secondary | ICD-10-CM | POA: Diagnosis present

## 2021-03-12 DIAGNOSIS — E785 Hyperlipidemia, unspecified: Secondary | ICD-10-CM | POA: Diagnosis not present

## 2021-03-12 DIAGNOSIS — K8309 Other cholangitis: Secondary | ICD-10-CM | POA: Diagnosis present

## 2021-03-12 DIAGNOSIS — K8050 Calculus of bile duct without cholangitis or cholecystitis without obstruction: Secondary | ICD-10-CM | POA: Diagnosis not present

## 2021-03-12 DIAGNOSIS — Z87891 Personal history of nicotine dependence: Secondary | ICD-10-CM | POA: Diagnosis not present

## 2021-03-12 DIAGNOSIS — R109 Unspecified abdominal pain: Secondary | ICD-10-CM | POA: Diagnosis not present

## 2021-03-12 DIAGNOSIS — A4181 Sepsis due to Enterococcus: Principal | ICD-10-CM | POA: Diagnosis present

## 2021-03-12 DIAGNOSIS — B952 Enterococcus as the cause of diseases classified elsewhere: Secondary | ICD-10-CM | POA: Diagnosis not present

## 2021-03-12 DIAGNOSIS — I10 Essential (primary) hypertension: Secondary | ICD-10-CM | POA: Diagnosis present

## 2021-03-12 DIAGNOSIS — G629 Polyneuropathy, unspecified: Secondary | ICD-10-CM | POA: Diagnosis not present

## 2021-03-12 DIAGNOSIS — R7881 Bacteremia: Secondary | ICD-10-CM | POA: Diagnosis not present

## 2021-03-12 DIAGNOSIS — A419 Sepsis, unspecified organism: Secondary | ICD-10-CM

## 2021-03-12 DIAGNOSIS — I129 Hypertensive chronic kidney disease with stage 1 through stage 4 chronic kidney disease, or unspecified chronic kidney disease: Secondary | ICD-10-CM | POA: Diagnosis not present

## 2021-03-12 DIAGNOSIS — K219 Gastro-esophageal reflux disease without esophagitis: Secondary | ICD-10-CM | POA: Diagnosis present

## 2021-03-12 DIAGNOSIS — N183 Chronic kidney disease, stage 3 unspecified: Secondary | ICD-10-CM | POA: Diagnosis not present

## 2021-03-12 DIAGNOSIS — Z743 Need for continuous supervision: Secondary | ICD-10-CM | POA: Diagnosis not present

## 2021-03-12 DIAGNOSIS — R1110 Vomiting, unspecified: Secondary | ICD-10-CM | POA: Diagnosis not present

## 2021-03-12 DIAGNOSIS — K7689 Other specified diseases of liver: Secondary | ICD-10-CM | POA: Diagnosis not present

## 2021-03-12 DIAGNOSIS — R932 Abnormal findings on diagnostic imaging of liver and biliary tract: Secondary | ICD-10-CM | POA: Diagnosis not present

## 2021-03-12 DIAGNOSIS — E872 Acidosis, unspecified: Secondary | ICD-10-CM | POA: Diagnosis not present

## 2021-03-12 DIAGNOSIS — R748 Abnormal levels of other serum enzymes: Secondary | ICD-10-CM | POA: Diagnosis not present

## 2021-03-12 DIAGNOSIS — K851 Biliary acute pancreatitis without necrosis or infection: Secondary | ICD-10-CM | POA: Diagnosis not present

## 2021-03-12 DIAGNOSIS — Z803 Family history of malignant neoplasm of breast: Secondary | ICD-10-CM | POA: Diagnosis not present

## 2021-03-12 DIAGNOSIS — K2980 Duodenitis without bleeding: Secondary | ICD-10-CM | POA: Diagnosis not present

## 2021-03-12 DIAGNOSIS — N1831 Chronic kidney disease, stage 3a: Secondary | ICD-10-CM | POA: Diagnosis present

## 2021-03-12 DIAGNOSIS — D72829 Elevated white blood cell count, unspecified: Secondary | ICD-10-CM | POA: Diagnosis not present

## 2021-03-12 LAB — URINALYSIS, ROUTINE W REFLEX MICROSCOPIC
Bacteria, UA: NONE SEEN
Bilirubin Urine: NEGATIVE
Glucose, UA: NEGATIVE mg/dL
Ketones, ur: NEGATIVE mg/dL
Leukocytes,Ua: NEGATIVE
Nitrite: NEGATIVE
Protein, ur: 100 mg/dL — AB
Specific Gravity, Urine: 1.015 (ref 1.005–1.030)
pH: 6 (ref 5.0–8.0)

## 2021-03-12 LAB — CBC WITH DIFFERENTIAL/PLATELET
Abs Immature Granulocytes: 0.38 10*3/uL — ABNORMAL HIGH (ref 0.00–0.07)
Basophils Absolute: 0 10*3/uL (ref 0.0–0.1)
Basophils Relative: 0 %
Eosinophils Absolute: 0 10*3/uL (ref 0.0–0.5)
Eosinophils Relative: 0 %
HCT: 43.9 % (ref 39.0–52.0)
Hemoglobin: 14.5 g/dL (ref 13.0–17.0)
Immature Granulocytes: 2 %
Lymphocytes Relative: 2 %
Lymphs Abs: 0.4 10*3/uL — ABNORMAL LOW (ref 0.7–4.0)
MCH: 28.8 pg (ref 26.0–34.0)
MCHC: 33 g/dL (ref 30.0–36.0)
MCV: 87.3 fL (ref 80.0–100.0)
Monocytes Absolute: 0.9 10*3/uL (ref 0.1–1.0)
Monocytes Relative: 4 %
Neutro Abs: 21 10*3/uL — ABNORMAL HIGH (ref 1.7–7.7)
Neutrophils Relative %: 92 %
Platelets: 283 10*3/uL (ref 150–400)
RBC: 5.03 MIL/uL (ref 4.22–5.81)
RDW: 13.3 % (ref 11.5–15.5)
WBC: 22.8 10*3/uL — ABNORMAL HIGH (ref 4.0–10.5)
nRBC: 0 % (ref 0.0–0.2)

## 2021-03-12 LAB — COMPREHENSIVE METABOLIC PANEL
ALT: 430 U/L — ABNORMAL HIGH (ref 0–44)
AST: 516 U/L — ABNORMAL HIGH (ref 15–41)
Albumin: 4.7 g/dL (ref 3.5–5.0)
Alkaline Phosphatase: 154 U/L — ABNORMAL HIGH (ref 38–126)
Anion gap: 8 (ref 5–15)
BUN: 24 mg/dL — ABNORMAL HIGH (ref 8–23)
CO2: 24 mmol/L (ref 22–32)
Calcium: 9.1 mg/dL (ref 8.9–10.3)
Chloride: 105 mmol/L (ref 98–111)
Creatinine, Ser: 1.28 mg/dL — ABNORMAL HIGH (ref 0.61–1.24)
GFR, Estimated: 54 mL/min — ABNORMAL LOW (ref >60)
Glucose, Bld: 137 mg/dL — ABNORMAL HIGH (ref 70–99)
Potassium: 3.8 mmol/L (ref 3.5–5.1)
Sodium: 137 mmol/L (ref 135–145)
Total Bilirubin: 2.5 mg/dL — ABNORMAL HIGH (ref 0.3–1.2)
Total Protein: 8.7 g/dL — ABNORMAL HIGH (ref 6.5–8.1)

## 2021-03-12 LAB — PROTIME-INR
INR: 1 (ref 0.8–1.2)
Prothrombin Time: 13.1 seconds (ref 11.4–15.2)

## 2021-03-12 LAB — RESP PANEL BY RT-PCR (FLU A&B, COVID) ARPGX2
Influenza A by PCR: NEGATIVE
Influenza B by PCR: NEGATIVE
SARS Coronavirus 2 by RT PCR: NEGATIVE

## 2021-03-12 LAB — LACTIC ACID, PLASMA
Lactic Acid, Venous: 2.8 mmol/L (ref 0.5–1.9)
Lactic Acid, Venous: 1.7 mmol/L (ref 0.5–1.9)

## 2021-03-12 LAB — APTT: aPTT: 28 seconds (ref 24–36)

## 2021-03-12 LAB — LIPASE, BLOOD: Lipase: 591 U/L — ABNORMAL HIGH (ref 11–51)

## 2021-03-12 MED ORDER — IOHEXOL 350 MG/ML SOLN
75.0000 mL | Freq: Once | INTRAVENOUS | Status: AC | PRN
  Administered 2021-03-12: 03:00:00 75 mL via INTRAVENOUS

## 2021-03-12 MED ORDER — PIPERACILLIN-TAZOBACTAM 3.375 G IVPB
3.3750 g | Freq: Three times a day (TID) | INTRAVENOUS | Status: DC
  Administered 2021-03-12 – 2021-03-14 (×8): 3.375 g via INTRAVENOUS
  Filled 2021-03-12 (×9): qty 50

## 2021-03-12 MED ORDER — GADOBUTROL 1 MMOL/ML IV SOLN
8.0000 mL | Freq: Once | INTRAVENOUS | Status: AC | PRN
  Administered 2021-03-12: 13:00:00 8 mL via INTRAVENOUS

## 2021-03-12 MED ORDER — TAMSULOSIN HCL 0.4 MG PO CAPS
0.4000 mg | ORAL_CAPSULE | Freq: Every day | ORAL | Status: DC
  Administered 2021-03-12 – 2021-03-14 (×3): 0.4 mg via ORAL
  Filled 2021-03-12 (×3): qty 1

## 2021-03-12 MED ORDER — LACTATED RINGERS IV BOLUS
500.0000 mL | Freq: Once | INTRAVENOUS | Status: AC
  Administered 2021-03-12: 05:00:00 500 mL via INTRAVENOUS

## 2021-03-12 MED ORDER — GABAPENTIN 300 MG PO CAPS
600.0000 mg | ORAL_CAPSULE | Freq: Every day | ORAL | Status: AC
  Administered 2021-03-12 – 2021-03-13 (×2): 600 mg via ORAL
  Filled 2021-03-12 (×2): qty 2

## 2021-03-12 MED ORDER — PANTOPRAZOLE SODIUM 40 MG IV SOLR
40.0000 mg | Freq: Two times a day (BID) | INTRAVENOUS | Status: DC
  Administered 2021-03-12: 11:00:00 40 mg via INTRAVENOUS
  Filled 2021-03-12: qty 40

## 2021-03-12 MED ORDER — LACTATED RINGERS IV BOLUS
500.0000 mL | Freq: Once | INTRAVENOUS | Status: AC
  Administered 2021-03-12: 01:00:00 500 mL via INTRAVENOUS

## 2021-03-12 MED ORDER — AMLODIPINE BESYLATE 5 MG PO TABS
5.0000 mg | ORAL_TABLET | Freq: Every day | ORAL | Status: DC
  Administered 2021-03-12 – 2021-03-14 (×3): 5 mg via ORAL
  Filled 2021-03-12 (×3): qty 1

## 2021-03-12 MED ORDER — DARIFENACIN HYDROBROMIDE ER 7.5 MG PO TB24
7.5000 mg | ORAL_TABLET | Freq: Every day | ORAL | Status: DC
  Administered 2021-03-12 – 2021-03-15 (×4): 7.5 mg via ORAL
  Filled 2021-03-12 (×4): qty 1

## 2021-03-12 MED ORDER — PANTOPRAZOLE SODIUM 40 MG PO TBEC
40.0000 mg | DELAYED_RELEASE_TABLET | Freq: Every day | ORAL | Status: DC
  Administered 2021-03-12 – 2021-03-15 (×4): 40 mg via ORAL
  Filled 2021-03-12 (×4): qty 1

## 2021-03-12 MED ORDER — ONDANSETRON HCL 4 MG/2ML IJ SOLN: 4.0000 mg | Freq: Four times a day (QID) | INTRAMUSCULAR | Status: DC | PRN

## 2021-03-12 MED ORDER — ONDANSETRON HCL 4 MG PO TABS: 4.0000 mg | ORAL_TABLET | Freq: Four times a day (QID) | ORAL | Status: DC | PRN

## 2021-03-12 MED ORDER — MORPHINE SULFATE (PF) 2 MG/ML IV SOLN: 2.0000 mg | INTRAVENOUS | Status: DC | PRN

## 2021-03-12 MED ORDER — SODIUM CHLORIDE 0.9 % IV SOLN
2.0000 g | Freq: Once | INTRAVENOUS | Status: AC
  Administered 2021-03-12: 01:00:00 2 g via INTRAVENOUS
  Filled 2021-03-12: qty 2

## 2021-03-12 MED ORDER — METRONIDAZOLE 500 MG/100ML IV SOLN
500.0000 mg | Freq: Once | INTRAVENOUS | Status: AC
  Administered 2021-03-12: 01:00:00 500 mg via INTRAVENOUS
  Filled 2021-03-12: qty 100

## 2021-03-12 MED ORDER — ACETAMINOPHEN 325 MG PO TABS
650.0000 mg | ORAL_TABLET | Freq: Four times a day (QID) | ORAL | Status: DC | PRN
  Administered 2021-03-12: 22:00:00 650 mg via ORAL
  Filled 2021-03-12: qty 2

## 2021-03-12 MED ORDER — TRAMADOL HCL 50 MG PO TABS
100.0000 mg | ORAL_TABLET | Freq: Two times a day (BID) | ORAL | Status: AC | PRN
  Administered 2021-03-12 – 2021-03-13 (×2): 100 mg via ORAL
  Filled 2021-03-12 (×2): qty 2

## 2021-03-12 MED ORDER — GADOBUTROL 1 MMOL/ML IV SOLN: 8.0000 mL | Freq: Once | INTRAVENOUS | Status: DC | PRN

## 2021-03-12 MED ORDER — VANCOMYCIN HCL IN DEXTROSE 1-5 GM/200ML-% IV SOLN
1000.0000 mg | Freq: Once | INTRAVENOUS | Status: AC
  Administered 2021-03-12: 01:00:00 1000 mg via INTRAVENOUS
  Filled 2021-03-12: qty 200

## 2021-03-12 MED ORDER — LACTATED RINGERS IV SOLN: INTRAVENOUS | Status: AC

## 2021-03-12 MED ORDER — ACETAMINOPHEN 650 MG RE SUPP: 650.0000 mg | Freq: Four times a day (QID) | RECTAL | Status: DC | PRN

## 2021-03-12 NOTE — Progress Notes (Signed)
Pt being followed by [facility] for Sepsis protocol. 

## 2021-03-12 NOTE — ED Notes (Signed)
Patient is now being transported for MRCP.

## 2021-03-12 NOTE — Assessment & Plan Note (Signed)
Admit to telemetry bed. IVF. NPO. Prn zofran. Prn morphine. IV protonix due to duodenitis seen on CT. Unclear if pancreatitis causing duodenitis or if duodenitis causing pancreatitis.

## 2021-03-12 NOTE — Assessment & Plan Note (Signed)
Pt with recent lap chole. Difficult gallbladder extraction. Pt with obstructive biliary labs. Will need MRCP to rule out retained/de novo stone. Pt has seen [facility] in the past. Will start IV zosyn due to concern for possible cholangitis.

## 2021-03-12 NOTE — Assessment & Plan Note (Signed)
IV protonix  

## 2021-03-12 NOTE — H&P (Addendum)
History and Physical    Timothy Khan ENI:[id number] DOB: [birth year] DOA: 03/12/2021  PCP: [name], MD   Patient coming from: Home  I have personally briefly reviewed patient's old medical records in [place]  Chief complaint: Fever, altered mental status, abdominal pain History present illness: 87 year old male with a history of BPH, hypertension, CKD stage III who presents to the ER today with a 1 day history of nausea, vomiting, fever, altered mental status. Patient states that about 8:00 this morning he had a ginger candy that he was trying out.  He states that his daughter left it at his house last night for him to try.  He states that as soon as he ate it, he immediately began feeling nauseated and had severe midepigastric pain.  He soon vomited afterwards and started feeling better.  He went to lay down.  Around 1:00 in the afternoon, he developed a fever.  He had some more nausea and vomiting.  His daughter Cecille Rubin called him around 6 PM.  He had been sleeping.  He seemed somewhat groggy on the phone.  When his daughter Cecille Rubin arrived into town from [place] around 11:00 PM, she noted that when she entered the room, the patient was having rigors.  He was confused and altered.  He was febrile.  She called 911 was brought to the hospital. On arrival, patient was febrile to 102.8.  Tachycardic to the 109.  Blood pressure 152/71.  Sats 94%.  Lab evaluation showed a white count of 22.8, hemoglobin 14.5, platelets 283  Initial lactic acid of 2.8, COVID test was negative.  Lipase elevated at 591  Chemistry sodium 137, potassium 3.8, bicarbonate 24  BUN of 24, creatinine 1.28  AST of 516, ALT of 430, alk phos 154, total bili 2.5  Urinalysis was essentially negative.  Chest x-ray with some left basilar atelectasis.  Some old rib fractures on the left-hand side are noted.  CT head demonstrated no acute intracranial hemorrhage.  There is a described subcortical  hypodensity left frontal lobe that was not there on February 2015.  Possible age-indeterminate infarct.  CT abdomen demonstrated duodenitis and pancreatitis with fat stranding of the duodenum and the first and second portions.  Patient is status postcholecystectomy.  Due to acute pancreatitis, possible retained stone, elevated LFTs and sepsis with acute organ dysfunction, [facility] contacted for admission.   ED Course: febrile to 102. WBC 22, lipase 500s, CT shows duodenitis/pancreatitis. Started on IV abx.  Review of Systems:  Review of Systems  Constitutional:  Positive for chills, fever and malaise/fatigue.  HENT: Negative.    Eyes: Negative.   Respiratory: Negative.    Cardiovascular: Negative.   Gastrointestinal:  Positive for abdominal pain, nausea and vomiting.  Genitourinary: Negative.   Musculoskeletal: Negative.   Skin: Negative.   Neurological:        Confusion  Endo/Heme/Allergies: Negative.   Psychiatric/Behavioral: Negative.    All other systems reviewed and are negative.  Past Medical History:  Diagnosis Date   Abnormal brain MRI    Right side   Basal cell carcinoma of back    BPH (benign prostatic hyperplasia)    Chronic cough    CKD (chronic kidney disease), stage III [facility])    Concussion 1950   Baseball injury   Diastolic dysfunction    ED (erectile dysfunction)    Foot drop, bilateral 05/02/2013   Gait disorder    Gait disturbance    Gastroesophageal reflux disease    Hyperlipidemia  Hypertension    Libido, decreased    Lumbago    Memory loss    Onychomycosis    OSA (obstructive sleep apnea)    Peripheral neuropathy    PSA elevation    SDH (subdural hematoma) 04/30/2013   Skull fracture ([place])    Skull fracture [facility])    Sleep apnea     Past Surgical History:  Procedure Laterality Date   BIOPSY  03/01/2020   Procedure: BIOPSY;  Surgeon: Ronnette Juniper, MD;  Location: [facility];  Service: Gastroenterology;;   CATARACT EXTRACTION  Bilateral    ESOPHAGEAL MANOMETRY N/A 01/05/2020   Procedure: ESOPHAGEAL MANOMETRY (EM);  Surgeon: Ronnette Juniper, MD;  Location: [facility];  Service: Gastroenterology;  Laterality: N/A;   ESOPHAGOGASTRODUODENOSCOPY (EGD) WITH PROPOFOL N/A 06/11/2018   Procedure: ESOPHAGOGASTRODUODENOSCOPY (EGD) WITH PROPOFOL;  Surgeon: Clarene Essex, MD;  Location: [facility];  Service: Endoscopy;  Laterality: N/A;   ESOPHAGOGASTRODUODENOSCOPY (EGD) WITH PROPOFOL N/A 03/01/2020   Procedure: ESOPHAGOGASTRODUODENOSCOPY (EGD) WITH PROPOFOL With Botox injections;  Surgeon: Ronnette Juniper, MD;  Location: [facility];  Service: Gastroenterology;  Laterality: N/A;   KNEE SURGERY Left    LAPAROSCOPIC CHOLECYSTECTOMY SINGLE SITE WITH INTRAOPERATIVE CHOLANGIOGRAM N/A 01/28/2021   Procedure: Laparscopic Cholecystectomy;  Drainage of empyema of gallbladder; Oversew of cystic duct with omentopexy;  Surgeon: Michael Boston, MD;  Location: [facility];  Service: General;  Laterality: N/A;   skin cancer resection     basal cell, [place] carcinoma   SUBMUCOSAL INJECTION  03/01/2020   Procedure: SUBMUCOSAL INJECTION;  Surgeon: Ronnette Juniper, MD;  Location: [facility];  Service: Gastroenterology;;   TONSILLECTOMY     VASECTOMY       reports that he quit smoking about 36 years ago. His smoking use included cigarettes. He has never used smokeless tobacco. He reports that he does not drink alcohol and does not use drugs.  Allergies  Allergen Reactions   Doxazosin Mesylate     Other reaction(s): fatigue   Lisinopril     Other reaction(s): cough   Macrolides And Ketolides     Other reaction(s): rash   Codeine Nausea Only    Family History  Problem Relation Age of Onset   Cancer Mother        Breast cancer   Heart attack Father 64   CAD Father    Heart disease Brother    Lupus Daughter     Prior to Admission medications   Medication Sig Start Date End Date Taking? Authorizing Provider  amLODipine (NORVASC) 5 MG tablet Take 5 mg  by mouth at bedtime. 06/24/18  Yes [provider]  cetirizine (ZYRTEC) 10 MG tablet Take 10 mg by mouth at bedtime.    Yes [provider]  Cholecalciferol (VITAMIN D3) 2000 UNITS capsule Take 2,000 Units by mouth daily.   Yes [provider]  Coenzyme Q10 (CO Q-10) 300 MG CAPS Take 300 mg by mouth daily.   Yes [provider]  Cyanocobalamin (B-12) 5000 MCG CAPS Take 5,000 mcg by mouth daily.   Yes [provider]  esomeprazole (NEXIUM) 20 MG capsule Take 20 mg by mouth daily.    Yes [provider]  fluocinonide (LIDEX) 0.05 % external solution Apply 1 application topically daily. 20 drops or 1 ml on the scalp 02/13/20  Yes [provider]  fluticasone (FLONASE) 50 MCG/ACT nasal spray Place 2 sprays into both nostrils at bedtime.  04/16/13  Yes [provider]  gabapentin (NEURONTIN) 300 MG capsule Take 2 capsules (  600 mg total) by mouth at bedtime. 12/04/17  Yes Kathrynn Ducking, MD  ondansetron (ZOFRAN) 4 MG tablet Take 4 mg by mouth 3 (three) times daily as needed for nausea or vomiting. 01/05/20  Yes [provider]  saccharomyces boulardii (FLORASTOR) 250 MG capsule Take 250 mg by mouth 2 (two) times daily.   Yes [provider]  senna-docusate (SENOKOT-S) 8.6-50 MG tablet Take 1 tablet by mouth daily as needed for mild constipation.   Yes [provider]  solifenacin (VESICARE) 5 MG tablet Take 5 mg by mouth every morning. 03/02/21  Yes [provider]  tamsulosin (FLOMAX) 0.4 MG CAPS Take 0.4 mg by mouth at bedtime.    Yes [provider]  traMADol (ULTRAM) 50 MG tablet Take 2 tablets (100 mg total) by mouth daily. Patient taking differently: Take 100 mg by mouth every evening. 06/15/18  Yes Purohit, Shrey C, MD  triamcinolone lotion (KENALOG) 0.1 % Apply 1 application topically daily. 02/04/21  Yes [provider]    Physical Exam: Vitals:   03/12/21 0230 03/12/21  0300 03/12/21 0400 03/12/21 0419  BP: 140/68 133/66 133/68   Pulse: 99 99 96   Resp: _0 Temp:    100 F (37.8 C)  TempSrc:    Oral  SpO2: 95% 95% 95%     Physical Exam Vitals and nursing note reviewed.  Constitutional:      General: He is not in acute distress.    Appearance: Normal appearance. He is normal weight. He is not ill-appearing, toxic-appearing or diaphoretic.  HENT:     Head: Normocephalic and atraumatic.     Nose: Nose normal. No rhinorrhea.  Eyes:     General:        Right eye: No discharge.        Left eye: No discharge.  Cardiovascular:     Rate and Rhythm: Normal rate and regular rhythm.  Pulmonary:     Effort: Pulmonary effort is normal. No respiratory distress.     Breath sounds: Normal breath sounds. No wheezing or rales.  Chest:     Chest wall: No tenderness.  Abdominal:     General: Bowel sounds are normal. There is distension.     Palpations: Abdomen is soft.     Tenderness: There is no abdominal tenderness. There is no guarding or rebound.  Musculoskeletal:     Right lower leg: No edema.     Left lower leg: No edema.  Skin:    General: Skin is warm and dry.     Capillary Refill: Capillary refill takes less than 2 seconds.  Neurological:     General: No focal deficit present.     Mental Status: He is alert and oriented to person, place, and time.     Labs on Admission: I have personally reviewed following labs and imaging studies  CBC: Recent Labs  Lab 03/12/21 0041  WBC 22.8*  NEUTROABS 21.0*  HGB 14.5  HCT 43.9  MCV 87.3  PLT 557   Basic Metabolic Panel: Recent Labs  Lab 03/12/21 0041  NA 137  K 3.8  CL 105  CO2 24  GLUCOSE 137*  BUN 24*  CREATININE 1.28*  CALCIUM 9.1   GFR: Estimated Creatinine Clearance: 42 mL/min (A) (by C-G formula based on SCr of 1.28 mg/dL (H)). Liver Function Tests: Recent Labs  Lab 03/12/21 0041  AST 516*  ALT 430*  ALKPHOS 154*  BILITOT 2.5*  PROT 8.7*  ALBUMIN 4.7   Recent  Labs  Lab 03/12/21 0041  LIPASE 591*   No results for input(s): AMMONIA in the last 168 hours. Coagulation Profile: Recent Labs  Lab 03/12/21 0041  INR 1.0   Cardiac Enzymes: No results for input(s): CKTOTAL, CKMB, CKMBINDEX, TROPONINI in the last 168 hours. BNP (last 3 results) No results for input(s): PROBNP in the last 8760 hours. HbA1C: No results for input(s): HGBA1C in the last 72 hours. CBG: No results for input(s): GLUCAP in the last 168 hours. Lipid Profile: No results for input(s): CHOL, HDL, LDLCALC, TRIG, CHOLHDL, LDLDIRECT in the last 72 hours. Thyroid Function Tests: No results for input(s): TSH, T4TOTAL, FREET4, T3FREE, THYROIDAB in the last 72 hours. Anemia Panel: No results for input(s): VITAMINB12, FOLATE, FERRITIN, TIBC, IRON, RETICCTPCT in the last 72 hours. Urine analysis:    Component Value Date/Time   COLORURINE YELLOW 03/12/2021 0041   APPEARANCEUR CLEAR 03/12/2021 0041   LABSPEC 1.015 03/12/2021 0041   PHURINE 6.0 03/12/2021 0041   GLUCOSEU NEGATIVE 03/12/2021 0041   HGBUR TRACE (A) 03/12/2021 0041   BILIRUBINUR NEGATIVE 03/12/2021 0041   KETONESUR NEGATIVE 03/12/2021 0041   PROTEINUR 100 (A) 03/12/2021 0041   NITRITE NEGATIVE 03/12/2021 0041   LEUKOCYTESUR NEGATIVE 03/12/2021 0041    Radiological Exams on Admission: I have personally reviewed images CT Head Wo Contrast  Result Date: 03/12/2021 CLINICAL DATA:  Mental status change, unknown cause. EXAM: CT HEAD WITHOUT CONTRAST TECHNIQUE: Contiguous axial images were obtained from the base of the skull through the vertex without intravenous contrast. COMPARISON:  05/08/2013. FINDINGS: Brain: No acute intracranial hemorrhage, midline shift or mass effect. No extra-axial fluid collection. Mild atrophy is noted. Extensive subcortical and periventricular white matter hypodensities are seen bilaterally. There is mild encephalomalacia in the frontal lobe on the right, unchanged from the prior exam.  There is a lacunar infarct in the thalamus on the right. A hypodensity is noted in the subcortical white matter in the anterior frontal lobe on the left, indeterminate in age. No hydrocephalus. Vascular: There is atherosclerotic calcification of the carotid siphons and vertebral arteries. No hyperdense vessel. Skull: Normal. Negative for fracture or focal lesion. Sinuses/Orbits: No acute finding. Other: None. IMPRESSION: 1. Subcortical hypodensity in the frontal lobe on the left anteriorly, possible infarct of indeterminate age. Comparison with more recent imaging studies or MRI is suggested for further evaluation. 2. 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 Electronically Signed   By: Brett Fairy M.D.   On: 03/12/2021 03:37   CT Abdomen Pelvis W Contrast  Result Date: 03/12/2021 CLINICAL DATA:  Abdominal pain, fever, and vomiting. EXAM: CT ABDOMEN AND PELVIS WITH CONTRAST TECHNIQUE: Multidetector CT imaging of the abdomen and pelvis was performed using the standard protocol following bolus administration of intravenous contrast. CONTRAST:  34m OMNIPAQUE IOHEXOL 350 MG/ML SOLN COMPARISON:  02/18/2021. FINDINGS: Lower chest: Bilateral lower lobe bronchiectasis. Strandy atelectasis is present at the lung bases. The heart is normal in size. Hepatobiliary: Stable hypodensities are present in the liver, likely cysts. No biliary ductal dilatation. Cholecystectomy changes are noted with mild fat stranding in the right upper quadrant. Pancreas: Fat stranding is noted about the pancreatic head. No pancreatic ductal dilatation. Spleen: Normal in size without focal abnormality. Adrenals/Urinary Tract: No adrenal nodule or mass. The kidneys enhance symmetrically. No renal calculus or hydronephrosis. Extrarenal pelvises are noted bilaterally. The bladder is unremarkable. Stomach/Bowel: There  is a small hiatal  hernia. The stomach is otherwise within normal limits. Fat stranding is noted about the first and second portions of the duodenum suggesting duodenitis. No bowel obstruction, free air or pneumatosis. Scattered diverticula are noted along the colon without evidence of diverticulitis. The appendix is prominent in size at 8 mm with no surrounding inflammatory changes, stable from the previous exam. Vascular/Lymphatic: A few prominent lymph nodes are noted at the porta hepatis, likely reactive. Aortic atherosclerosis without evidence of aneurysm. Reproductive: Prostate gland is enlarged. Other: No free fluid. Musculoskeletal: Degenerative changes are present in the thoracic spine. A stable compression deformity is present at L1. A mild compression deformity is present in the superior endplate at Z73, unchanged. No acute osseous abnormality. IMPRESSION: 1. Findings compatible with pancreatitis/duodenitis. 2. Status post cholecystectomy with fat stranding at the gallbladder fossa, unchanged. 3. Small hiatal hernia. 4. Prominent appendix at 8 mm in diameter, unchanged from the previous exam and likely normal variant. 5. Remaining chronic findings as described above. Electronically Signed   By: Brett Fairy M.D.   On: 03/12/2021 03:50   DG Chest Port 1 View  Result Date: 03/12/2021 CLINICAL DATA:  Possible sepsis with fevers and vomiting, initial encounter EXAM: PORTABLE CHEST 1 VIEW COMPARISON:  02/18/2021 FINDINGS: Cardiac shadow is stable. Lungs are well aerated bilaterally. Minimal left basilar atelectasis is noted. No focal confluent infiltrate is seen. Old rib fractures on the left are noted. IMPRESSION: New left basilar atelectasis.  No other focal abnormality is noted. Electronically Signed   By: Inez Catalina M.D.   On: 03/12/2021 01:52    EKG: I have personally reviewed EKG: sinus tachycardia    Assessment/Plan Principal Problem:   Acute pancreatitis Active Problems:   Elevated LFTs -cannot rule out  retained/de novo biliary stone   Sepsis with acute organ dysfunction without septic shock (HCC)   Hypertension   BPH (benign prostatic hyperplasia)   GERD (gastroesophageal reflux disease)   Chronic kidney disease, stage III (moderate) (HCC)    Acute pancreatitis Admit to telemetry bed. IVF. NPO. Prn zofran. Prn morphine. IV protonix due to duodenitis seen on CT. Unclear if pancreatitis causing duodenitis or if duodenitis causing pancreatitis.  Elevated LFTs -cannot rule out retained/de novo biliary stone Pt with recent lap chole. Difficult gallbladder extraction. Pt with obstructive biliary labs. Will need MRCP to rule out retained/de novo stone. Pt has seen [facility] in the past. Will start IV zosyn due to concern for possible cholangitis.  Sepsis with acute organ dysfunction without septic shock ([place]) Continue with IVF. 30 ml/kg IVF not given in ER due to normal BP.  This was an EDP decision.  BPH (benign prostatic hyperplasia) Stable and chronic  Chronic kidney disease, stage III (moderate) (HCC) Stable and chronic  GERD (gastroesophageal reflux disease) IV protonix  Hypertension Stable and chronic  DVT prophylaxis: SCDs Code Status: Full Code Family Communication: discussed with pt and dtr lori at bedside  Disposition Plan: return home  Consults called: none  Admission status: Inpatient, Telemetry bed   Kristopher Oppenheim, DO [facility] 03/12/2021, 5:14 AM

## 2021-03-12 NOTE — ED Triage Notes (Signed)
Pt BIB [facility] from home for fever and vomiting since about 6p. Daughter called because he was altered. Gave ibuprofen about 30 mins prior to calling EMS.

## 2021-03-12 NOTE — Progress Notes (Signed)
A consult was received from an ED physician for Vancomycin and Cefepime per pharmacy dosing.  The patient's profile has been reviewed for ht/wt/allergies/indication/available labs.    A one time order has been placed for Vancomycin 1gm IV and Cefepime 2gm IV.    Further antibiotics/pharmacy consults should be ordered by admitting physician if indicated.                       Thank you, Everette Rank, PharmD 03/12/2021  1:13 AM

## 2021-03-12 NOTE — ED Notes (Signed)
Patients condom cath and sheets changed.

## 2021-03-12 NOTE — ED Provider Notes (Signed)
[facility] DEPT Provider Note   CSN: [id number] Arrival date & time: 03/12/21  0019     History Chief Complaint  Patient presents with   Vomiting   Fever    Timothy Khan is a 87 y.o. male with a history of CKD, hypertension, hyperlipidemia, sleep apnea, prior subdural hematoma, and pancreatitis who presents to the emergency department via EMS with complaints of nausea & vomiting today with AMS.  Patient states that earlier today he developed some abdominal pain with subsequent 4 episodes of vomiting, his stomach feels better now, no other alleviating or aggravating factors..  He states that he did develop a fever.  He denies headache, chest pain, shortness of breath, cough, diarrhea, melena, or dysuria.  He is oriented to person and place, disoriented to year but is aware that we are coming up on the new year.  Per EMS for additional history who spoke with patient's daughter-confirmed above, also relayed that patient became confused therefore she gave the patient 400 milligrams of ibuprofen for his fever and called EMS, EMS states the patient was febrile to 102.7 on arrival with some confusion and somnolence, they gave him a small amount of fluid with significant improvement and he was alert and oriented in route.  HPI     Past Medical History:  Diagnosis Date   Abnormal brain MRI    Right side   Basal cell carcinoma of back    BPH (benign prostatic hyperplasia)    Chronic cough    CKD (chronic kidney disease), stage III ([place])    Concussion 1950   Baseball injury   Diastolic dysfunction    ED (erectile dysfunction)    Foot drop, bilateral 05/02/2013   Gait disorder    Gait disturbance    Gastroesophageal reflux disease    Hyperlipidemia    Hypertension    Libido, decreased    Lumbago    Memory loss    Onychomycosis    OSA (obstructive sleep apnea)    Peripheral neuropathy    PSA elevation    Skull fracture (HCC)    Skull fracture  (HCC)    Sleep apnea     Patient Active Problem List   Diagnosis Date Noted   Chest pain of uncertain etiology 19/14/7829   Chronic kidney disease, stage III (moderate) ([place]) 03/01/2021   Former smoker 03/01/2021   Family history of early CAD 03/01/2021   Epigastric pain 01/26/2021   SIRS (systemic inflammatory response syndrome) ([place]) 01/25/2021   Intractable abdominal pain 01/25/2021   Nausea and vomiting 01/25/2021   OSA (obstructive sleep apnea) 07/25/2020   Acute-on-chronic kidney injury ([place]) 07/25/2020   Acute pancreatitis 06/06/2018   BPH (benign prostatic hyperplasia) 06/06/2018   LBBB (left bundle branch block) 06/06/2018   GERD (gastroesophageal reflux disease) 06/06/2018   Esophageal thickening 06/06/2018   Hiatal hernia 06/06/2018   Foot drop, bilateral 05/02/2013   SDH (subdural hematoma) 04/30/2013   Polyneuropathy 04/30/2013   Skull fracture ([place]) 04/30/2013   Thoracic or lumbosacral neuritis or radiculitis, unspecified 03/18/2012   Hereditary and idiopathic peripheral neuropathy 03/18/2012   Abnormality of gait 03/18/2012   Concussion with no loss of consciousness 03/18/2012   Hyperlipidemia 03/18/2012   Hypertension 03/18/2012    Past Surgical History:  Procedure Laterality Date   BIOPSY  03/01/2020   Procedure: BIOPSY;  Surgeon: Ronnette Juniper, MD;  Location: [facility];  Service: Gastroenterology;;   CATARACT EXTRACTION Bilateral    ESOPHAGEAL MANOMETRY N/A 01/05/2020  Procedure: ESOPHAGEAL MANOMETRY (EM);  Surgeon: Ronnette Juniper, MD;  Location: [facility];  Service: Gastroenterology;  Laterality: N/A;   ESOPHAGOGASTRODUODENOSCOPY (EGD) WITH PROPOFOL N/A 06/11/2018   Procedure: ESOPHAGOGASTRODUODENOSCOPY (EGD) WITH PROPOFOL;  Surgeon: Clarene Essex, MD;  Location: [facility];  Service: Endoscopy;  Laterality: N/A;   ESOPHAGOGASTRODUODENOSCOPY (EGD) WITH PROPOFOL N/A 03/01/2020   Procedure: ESOPHAGOGASTRODUODENOSCOPY (EGD) WITH PROPOFOL With Botox  injections;  Surgeon: Ronnette Juniper, MD;  Location: [facility];  Service: Gastroenterology;  Laterality: N/A;   KNEE SURGERY Left    LAPAROSCOPIC CHOLECYSTECTOMY SINGLE SITE WITH INTRAOPERATIVE CHOLANGIOGRAM N/A 01/28/2021   Procedure: Laparscopic Cholecystectomy;  Drainage of empyema of gallbladder; Oversew of cystic duct with omentopexy;  Surgeon: Michael Boston, MD;  Location: [facility];  Service: General;  Laterality: N/A;   skin cancer resection     basal cell, [place] carcinoma   SUBMUCOSAL INJECTION  03/01/2020   Procedure: SUBMUCOSAL INJECTION;  Surgeon: Ronnette Juniper, MD;  Location: [facility];  Service: Gastroenterology;;   TONSILLECTOMY     VASECTOMY         Family History  Problem Relation Age of Onset   Cancer Mother        Breast cancer   Heart attack Father 22   CAD Father    Heart disease Brother    Lupus Daughter     Social History   Tobacco Use   Smoking status: Former    Types: Cigarettes    Quit date: 08/18/1984    Years since quitting: 36.5   Smokeless tobacco: Never  Vaping Use   Vaping Use: Never used  Substance Use Topics   Alcohol use: No   Drug use: No    Home Medications Prior to Admission medications   Medication Sig Start Date End Date Taking? Authorizing Provider  amLODipine (NORVASC) 5 MG tablet Take 5 mg by mouth at bedtime. 06/24/18  Yes [provider]  cetirizine (ZYRTEC) 10 MG tablet Take 10 mg by mouth at bedtime.    Yes [provider]  Cholecalciferol (VITAMIN D3) 2000 UNITS capsule Take 2,000 Units by mouth daily.   Yes [provider]  Coenzyme Q10 (CO Q-10) 300 MG CAPS Take 300 mg by mouth daily.   Yes [provider]  Cyanocobalamin (B-12) 5000 MCG CAPS Take 5,000 mcg by mouth daily.   Yes [provider]  esomeprazole (NEXIUM) 20 MG capsule Take 20 mg by mouth daily.    Yes [provider]  fluocinonide (LIDEX) 0.05 % external solution Apply 1 application topically daily. 20 drops or 1  ml on the scalp 02/13/20  Yes [provider]  fluticasone (FLONASE) 50 MCG/ACT nasal spray Place 2 sprays into both nostrils at bedtime.  04/16/13  Yes [provider]  gabapentin (NEURONTIN) 300 MG capsule Take 2 capsules (600 mg total) by mouth at bedtime. 12/04/17  Yes Kathrynn Ducking, MD  ondansetron (ZOFRAN) 4 MG tablet Take 4 mg by mouth 3 (three) times daily as needed for nausea or vomiting. 01/05/20  Yes [provider]  saccharomyces boulardii (FLORASTOR) 250 MG capsule Take 250 mg by mouth 2 (two) times daily.   Yes [provider]  senna-docusate (SENOKOT-S) 8.6-50 MG tablet Take 1 tablet by mouth daily as needed for mild constipation.   Yes [provider]  solifenacin (VESICARE) 5 MG tablet Take 5 mg by mouth every morning. 03/02/21  Yes [provider]  tamsulosin (FLOMAX) 0.4 MG CAPS Take 0.4 mg by mouth at bedtime.  Yes [provider]  traMADol (ULTRAM) 50 MG tablet Take 2 tablets (100 mg total) by mouth daily. Patient taking differently: Take 100 mg by mouth every evening. 06/15/18  Yes Purohit, Shrey C, MD  triamcinolone lotion (KENALOG) 0.1 % Apply 1 application topically daily. 02/04/21  Yes [provider]    Allergies    Doxazosin mesylate, Lisinopril, Macrolides and ketolides, and Codeine  Review of Systems   Review of Systems  Constitutional:  Positive for fever.  Respiratory:  Negative for cough and shortness of breath.   Cardiovascular:  Negative for chest pain.  Gastrointestinal:  Positive for abdominal pain, nausea and vomiting. Negative for diarrhea.  Genitourinary:  Negative for dysuria.  Psychiatric/Behavioral:  Positive for confusion.   All other systems reviewed and are negative.  Physical Exam Updated Vital Signs BP 133/68    Pulse 96    Temp 100 F (37.8 C) (Oral)    Resp 18    SpO2 95%   Physical Exam Vitals and nursing note reviewed.  Constitutional:      Appearance: He is  well-developed. He is not toxic-appearing.  HENT:     Head: Normocephalic and atraumatic.  Eyes:     General:        Right eye: No discharge.        Left eye: No discharge.     Conjunctiva/sclera: Conjunctivae normal.  Cardiovascular:     Rate and Rhythm: Regular rhythm. Tachycardia present.  Pulmonary:     Effort: Pulmonary effort is normal. No respiratory distress.     Breath sounds: Normal breath sounds. No wheezing, rhonchi or rales.  Abdominal:     General: There is no distension.     Palpations: Abdomen is soft.     Tenderness: There is abdominal tenderness in the left upper quadrant and left lower quadrant. There is no guarding.  Musculoskeletal:     Cervical back: Neck supple. No rigidity.     Right lower leg: No edema.     Left lower leg: No edema.  Skin:    General: Skin is warm and dry.     Findings: No rash.  Neurological:     Mental Status: He is alert.     Comments: Clear speech.  Oriented to person, place, disoriented to year, however knows that we are approaching the new year.  Strength and sensation grossly tact bilateral upper and lower extremities.  No facial droop.  Psychiatric:        Behavior: Behavior normal.    ED Results / Procedures / Treatments   Labs (all labs ordered are listed, but only abnormal results are displayed) Labs Reviewed  LACTIC ACID, PLASMA - Abnormal; Notable for the following components:      Result Value   Lactic Acid, Venous 2.8 (*)    All other components within normal limits  COMPREHENSIVE METABOLIC PANEL - Abnormal; Notable for the following components:   Glucose, Bld 137 (*)    BUN 24 (*)    Creatinine, Ser 1.28 (*)    Total Protein 8.7 (*)    AST 516 (*)    ALT 430 (*)    Alkaline Phosphatase 154 (*)    Total Bilirubin 2.5 (*)    GFR, Estimated 54 (*)    All other components within normal limits  CBC WITH DIFFERENTIAL/PLATELET - Abnormal; Notable for the following components:   WBC 22.8 (*)    Neutro Abs 21.0 (*)     Lymphs Abs 0.4 (*)  Abs Immature Granulocytes 0.38 (*)    All other components within normal limits  URINALYSIS, ROUTINE W REFLEX MICROSCOPIC - Abnormal; Notable for the following components:   Hgb urine dipstick TRACE (*)    Protein, ur 100 (*)    All other components within normal limits  LIPASE, BLOOD - Abnormal; Notable for the following components:   Lipase 591 (*)    All other components within normal limits  RESP PANEL BY RT-PCR (FLU A&B, COVID) ARPGX2  CULTURE, BLOOD (ROUTINE X 2)  CULTURE, BLOOD (ROUTINE X 2)  URINE CULTURE  LACTIC ACID, PLASMA  PROTIME-INR  APTT    EKG EKG Interpretation  Date/Time:  Saturday March 12 2021 01:17:42 EST Ventricular Rate:  104 PR Interval:  189 QRS Duration: 79 QT Interval:  344 QTC Calculation: 453 R Axis:   16 Text Interpretation: Sinus tachycardia Aberrant conduction of SV complex(es) Probable left atrial enlargement Borderline T abnormalities, lateral leads Confirmed by Quintella Reichert [id number]) on 03/12/2021 1:21:24 AM  Radiology CT Head Wo Contrast  Result Date: 03/12/2021 CLINICAL DATA:  Mental status change, unknown cause. EXAM: CT HEAD WITHOUT CONTRAST TECHNIQUE: Contiguous axial images were obtained from the base of the skull through the vertex without intravenous contrast. COMPARISON:  05/08/2013. FINDINGS: Brain: No acute intracranial hemorrhage, midline shift or mass effect. No extra-axial fluid collection. Mild atrophy is noted. Extensive subcortical and periventricular white matter hypodensities are seen bilaterally. There is mild encephalomalacia in the frontal lobe on the right, unchanged from the prior exam. There is a lacunar infarct in the thalamus on the right. A hypodensity is noted in the subcortical white matter in the anterior frontal lobe on the left, indeterminate in age. No hydrocephalus. Vascular: There is atherosclerotic calcification of the carotid siphons and vertebral arteries. No hyperdense vessel.  Skull: Normal. Negative for fracture or focal lesion. Sinuses/Orbits: No acute finding. Other: None. IMPRESSION: 1. Subcortical hypodensity in the frontal lobe on the left anteriorly, possible infarct of indeterminate age. Comparison with more recent imaging studies or MRI is suggested for further evaluation. 2. 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 Electronically Signed   By: Brett Fairy M.D.   On: 03/12/2021 03:37   CT Abdomen Pelvis W Contrast  Result Date: 03/12/2021 CLINICAL DATA:  Abdominal pain, fever, and vomiting. EXAM: CT ABDOMEN AND PELVIS WITH CONTRAST TECHNIQUE: Multidetector CT imaging of the abdomen and pelvis was performed using the standard protocol following bolus administration of intravenous contrast. CONTRAST:  40mL OMNIPAQUE IOHEXOL 350 MG/ML SOLN COMPARISON:  02/18/2021. FINDINGS: Lower chest: Bilateral lower lobe bronchiectasis. Strandy atelectasis is present at the lung bases. The heart is normal in size. Hepatobiliary: Stable hypodensities are present in the liver, likely cysts. No biliary ductal dilatation. Cholecystectomy changes are noted with mild fat stranding in the right upper quadrant. Pancreas: Fat stranding is noted about the pancreatic head. No pancreatic ductal dilatation. Spleen: Normal in size without focal abnormality. Adrenals/Urinary Tract: No adrenal nodule or mass. The kidneys enhance symmetrically. No renal calculus or hydronephrosis. Extrarenal pelvises are noted bilaterally. The bladder is unremarkable. Stomach/Bowel: There is a small hiatal hernia. The stomach is otherwise within normal limits. Fat stranding is noted about the first and second portions of the duodenum suggesting duodenitis. No bowel obstruction, free air or pneumatosis. Scattered diverticula are noted along the colon without evidence of diverticulitis. The appendix is prominent in  size at 8 mm with  no surrounding inflammatory changes, stable from the previous exam. Vascular/Lymphatic: A few prominent lymph nodes are noted at the porta hepatis, likely reactive. Aortic atherosclerosis without evidence of aneurysm. Reproductive: Prostate gland is enlarged. Other: No free fluid. Musculoskeletal: Degenerative changes are present in the thoracic spine. A stable compression deformity is present at L1. A mild compression deformity is present in the superior endplate at B15, unchanged. No acute osseous abnormality. IMPRESSION: 1. Findings compatible with pancreatitis/duodenitis. 2. Status post cholecystectomy with fat stranding at the gallbladder fossa, unchanged. 3. Small hiatal hernia. 4. Prominent appendix at 8 mm in diameter, unchanged from the previous exam and likely normal variant. 5. Remaining chronic findings as described above. Electronically Signed   By: Brett Fairy M.D.   On: 03/12/2021 03:50   DG Chest Port 1 View  Result Date: 03/12/2021 CLINICAL DATA:  Possible sepsis with fevers and vomiting, initial encounter EXAM: PORTABLE CHEST 1 VIEW COMPARISON:  02/18/2021 FINDINGS: Cardiac shadow is stable. Lungs are well aerated bilaterally. Minimal left basilar atelectasis is noted. No focal confluent infiltrate is seen. Old rib fractures on the left are noted. IMPRESSION: New left basilar atelectasis.  No other focal abnormality is noted. Electronically Signed   By: Inez Catalina M.D.   On: 03/12/2021 01:52    Procedures .Critical Care Performed by: Amaryllis Dyke, PA-C Authorized by: Amaryllis Dyke, PA-C    CRITICAL CARE Performed by: Kennith Maes   Total critical care time: 35 minutes  Critical care time was exclusive of separately billable procedures and treating other patients.  Critical care was necessary to treat or prevent imminent or life-threatening deterioration.  Critical care was time spent personally by me on the following activities:  development of treatment plan with patient and/or surrogate as well as nursing, discussions with consultants, evaluation of patient's response to treatment, examination of patient, obtaining history from patient or surrogate, ordering and performing treatments and interventions, ordering and review of laboratory studies, ordering and review of radiographic studies, pulse oximetry and re-evaluation of patient's condition.  Medications Ordered in ED Medications  lactated ringers bolus 500 mL (0 mLs Intravenous Stopped 03/12/21 0257)  ceFEPIme (MAXIPIME) 2 g in sodium chloride 0.9 % 100 mL IVPB (0 g Intravenous Stopped 03/12/21 0257)  metroNIDAZOLE (FLAGYL) IVPB 500 mg (0 mg Intravenous Stopped 03/12/21 0257)  vancomycin (VANCOCIN) IVPB 1000 mg/200 mL premix (0 mg Intravenous Stopped 03/12/21 0257)  iohexol (OMNIPAQUE) 350 MG/ML injection 75 mL (75 mLs Intravenous Contrast Given 03/12/21 0318)  lactated ringers bolus 500 mL (500 mLs Intravenous New Bag/Given 03/12/21 0439)    ED Course  I have reviewed the triage vital signs and the nursing notes.  Pertinent labs & imaging results that were available during my care of the patient were reviewed by me and considered in my medical decision making (see chart for details).    MDM Rules/Calculators/A&P                           Patient presents to the ED for evaluation of nausea, vomiting, fever, and altered mental status.  Patient is nontoxic, notably febrile to 102.8 with likely resultant tachycardia. Sepsis evaluation initiated with abx & fluids initiated. He is not hypotensive therefore no 30 cc/kg bolus at this time.   Additional history obtained:  Additional history obtained from chart review & nursing note review.   Echo 2022: Left ventricular ejection fraction, by estimation, is 65 to 70%. The left ventricle has normal  function. The left ventricle has no regional  wall motion abnormalities. There is mild left ventricular hypertrophy.   Left ventricular diastolic parameters  are consistent with Grade I diastolic dysfunction   Recent cholecystectomy   Lab Tests:  I Ordered, reviewed, and interpreted labs, which included:  CBC: Notable leukocytosis at 22.8 CMP: Transaminitis with elevated total bilirubin, renal function similar to prior Lipase: Elevated at 591 APTT/PT/INR: Within normal limits Lactic acid: Elevated at 2.8 Urinalysis: No obvious UTI  Imaging Studies ordered:  I ordered imaging studies which included chest x-ray, CT head, and CT abdomen/pelvis, I independently reviewed, formal radiology impression shows:  Chest x-ray:New left basilar atelectasis.  No other focal abnormality is noted. CT head: 1. Subcortical hypodensity in the frontal lobe on the left anteriorly, possible infarct of indeterminate age. Comparison with more recent imaging studies or MRI is suggested for further evaluation. 2. 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  CT abdomen/pelvis: 1. Findings compatible with pancreatitis/duodenitis. 2. Status post cholecystectomy with fat stranding at the gallbladder fossa, unchanged. 3. Small hiatal hernia. 4. Prominent appendix at 8 mm in diameter, unchanged from the previous exam and likely normal variant. 5. Remaining chronic findings as described above.   ED Course: Patient presentation concerning for sepsis with encephalopathy, leukocytosis and lactic acidosis noted, found to have pancreatitis/duodenitis on CT abdomen/pelvis, questioning whether or not patient has retained stone status post cholecystectomy given his elevation in his LFTs and total bilirubin, may benefit from MRCP.  In regards to CT head with subcortical hypodensity noted in the left frontal lobe, no bleed, patient without focal neurologic deficits in the ED. patient feeling improved on reassessment, his vital signs have improved  as well, will discuss with hospitalist service for admission.  Patient and his daughter updated on results and plan of care and are in agreement.  Discussed findings and plan of care with supervising physician Dr. Ralene Bathe who has evaluated the patient as shared visit, in agreement.  04:38: CONSULT: Discussed with hospitalist Dr. Bridgett Larsson- accepts admission.   Portions of this note were generated with [employer]. Dictation errors may occur despite best attempts at proofreading.   Final Clinical Impression(s) / ED Diagnoses Final diagnoses:  Acute pancreatitis, unspecified complication status, unspecified pancreatitis type  Sepsis with encephalopathy without septic shock, due to unspecified organism [facility]: [facility] Rhonda W)  Transaminitis    Rx / DC Orders ED Discharge Orders     None        Amaryllis Dyke, PA-C 03/12/21 0445    Quintella Reichert, MD 03/14/21 [phone]

## 2021-03-12 NOTE — Progress Notes (Signed)
PROGRESS NOTE    Timothy Khan  QHU:[id number] DOB: October 20, 1931 DOA: 03/12/2021 PCP: Timothy Orn, MD    Brief Narrative:  Timothy Khan was admitted to the hospital with the working diagnosis of acute pancreatitis.  87 yo male with the past medical history with the past medical history of BPH, CKD stage 3 and HTN who presented with fever, abdominal pain and altered mental status. Patient developed post pandrial abdominal pain, associated with nausea and vomiting, posteriorly developed fever and was noted to be altered and ill looking appearing. EMS was called and patient was brought to the hospital, on his initial physical examination he was febrile 102.8, HR 109, oxygen saturation 94%, and RR 14 to 18. His lungs were clear to auscultation, heart with S1 and S2 present, abdomen was distended but not tender, no lower extremity edema.  NA 137, K 3,8, CL 105, bicarb 24, glucose 137, BUN 24, Cr 1,28.  Alkaline phosphatase 154, lipase 591, AST 516, ALT 430, total bilirubin is 2.5.  Lactic acid 2.8. White count 22.8, hemoglobin 14.5, hematocrit 43.9, platelets 283. SARS COVID-19 negative.  Urine analysis specific gravity 1.015, negative nitrates, negative leukocytes.  Head CT with subcortical hypodensity in the frontal lobe on the left anteriorly possible infarct of indeterminate age.  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  CT of the abdomen Findings compatible with pancreatitis/duodenitis.  Status postcholecystectomy with fat stranding in the gallbladder fossa. No biliary duct dilatation. Pancreas with fat stranding about the pancreatic head.  No pancreatic duct dilatation.  Chest radiograph no infiltrates.  EKG with 104 bpm, normal axis, normal intervals, sinus rhythm, no significant ST segment or T wave changes.   Assessment & Plan:   Principal Problem:   Acute  pancreatitis Active Problems:   Hypertension   BPH (benign prostatic hyperplasia)   GERD (gastroesophageal reflux disease)   Chronic kidney disease, stage III (moderate) (HCC)   Elevated LFTs -cannot rule out retained/de novo biliary stone   Sepsis with acute organ dysfunction without septic shock (HCC)  Acute pancreatitis. Patient had cholecystectomy on 01/28/21, JP drain was removed on 02/08/21.   His abdominal pain has improved, he has no nausea or vomiting, and has been NPO.  Elevated AST, ALT, ALK P and total bilirubin with a cholestatic pattern, associated with fever and leukocytosis.  Suspected cholangitis, sepsis present on admission.   Plan to continue pain control with IV analgesics and IV fluids. Antibiotic therapy with IV zosyn and follow up MRCP results.  Continue with as needed antiemetics and will advance diet to clears.   2. CKD stage 3a. Continue supportive IV fluids and follow up renal function in am. Avoid hypotension and nephrotoxic medications.   3. HTN. Continue blood pressure monitoring. Continue amlodipine for blood pressure control.   4.GERD. Continue antiacid therapy with pantoprazole.   5. BPH. No urinary retention.  Continue with enablex and tamsulosin   Patient continue to be at high risk for worsening sepsis   Status is: Inpatient  Remains inpatient appropriate because: IV antibiotics and MRCP    DVT prophylaxis:  Enoxaparin   Code Status:    full  Family Communication:   I spoke with patient's son at the bedside, we talked in detail about patient's condition, plan of care and prognosis and all questions were addressed.    Antimicrobials:  IV Zosyn     Subjective: Patient is feeling better, but  not back to his baseline, this am with no abdominal pain, no nausea or vomiting, he has been npo.   Objective: Vitals:   03/12/21 0600 03/12/21 0630 03/12/21 0739 03/12/21 0842  BP: (!) 143/71 (!) 145/71 (!) 166/83 (!) 164/76  Pulse: 89 89 88 95   Resp: _0 (!) 21  Temp:      TempSrc:      SpO2: 92% 96% 96% 96%    Intake/Output Summary (Last 24 hours) at 03/12/2021 1045 Last data filed at 03/12/2021 0920 Gross per 24 hour  Intake 1396.11 ml  Output 2500 ml  Net -1103.89 ml   There were no vitals filed for this visit.  Examination:   General: Not in pain or dyspnea, deconditioned  Neurology: Awake and alert, non focal  E ENT: no  pallor, no icterus, oral mucosa moist Cardiovascular: No JVD. S1-S2 present, rhythmic,  No lower extremity edema. Pulmonary: positive breath sounds bilaterally, with no wheezing, rhonchi or rales. Gastrointestinal. Abdomen soft and non tender Skin. No rashes Musculoskeletal: no joint deformities     Data Reviewed: I have personally reviewed following labs and imaging studies  CBC: Recent Labs  Lab 03/12/21 0041  WBC 22.8*  NEUTROABS 21.0*  HGB 14.5  HCT 43.9  MCV 87.3  PLT 858   Basic Metabolic Panel: Recent Labs  Lab 03/12/21 0041  NA 137  K 3.8  CL 105  CO2 24  GLUCOSE 137*  BUN 24*  CREATININE 1.28*  CALCIUM 9.1   GFR: Estimated Creatinine Clearance: 42 mL/min (A) (by C-G formula based on SCr of 1.28 mg/dL (H)). Liver Function Tests: Recent Labs  Lab 03/12/21 0041  AST 516*  ALT 430*  ALKPHOS 154*  BILITOT 2.5*  PROT 8.7*  ALBUMIN 4.7   Recent Labs  Lab 03/12/21 0041  LIPASE 591*   No results for input(s): AMMONIA in the last 168 hours. Coagulation Profile: Recent Labs  Lab 03/12/21 0041  INR 1.0   Cardiac Enzymes: No results for input(s): CKTOTAL, CKMB, CKMBINDEX, TROPONINI in the last 168 hours. BNP (last 3 results) No results for input(s): PROBNP in the last 8760 hours. HbA1C: No results for input(s): HGBA1C in the last 72 hours. CBG: No results for input(s): GLUCAP in the last 168 hours. Lipid Profile: No results for input(s): CHOL, HDL, LDLCALC, TRIG, CHOLHDL, LDLDIRECT in the last 72 hours. Thyroid Function Tests: No results for  input(s): TSH, T4TOTAL, FREET4, T3FREE, THYROIDAB in the last 72 hours. Anemia Panel: No results for input(s): VITAMINB12, FOLATE, FERRITIN, TIBC, IRON, RETICCTPCT in the last 72 hours.    Radiology Studies: I have reviewed all of the imaging during this hospital visit personally     Scheduled Meds:  amLODipine  5 mg Oral QHS   darifenacin  7.5 mg Oral Daily   pantoprazole (PROTONIX) IV  40 mg Intravenous Q12H   tamsulosin  0.4 mg Oral QHS   Continuous Infusions:  lactated ringers 100 mL/hr at 03/12/21 0524   piperacillin-tazobactam (ZOSYN)  IV       LOS: 0 days        Delesia Martinek Gerome Apley, MD

## 2021-03-12 NOTE — Assessment & Plan Note (Signed)
Stable and chronic. 

## 2021-03-12 NOTE — Progress Notes (Signed)
Pharmacy Antibiotic Note  Timothy Khan is a 87 y.o. male admitted on 03/12/2021 with intra-abdominal infection.  Pharmacy has been consulted for Zosyn dosing.  In the ED patient received Vancomycin 1gm IV, Cefepime 2gm IV and Metronidazole 500mg  IV x 1 dose each.  Plan: Zosyn 3.375g IV q8h (4 hour infusion). Need for further dosage adjustment appears unlikely at present.    Will sign off at this time.  Please reconsult if a change in clinical status warrants re-evaluation of dosage.    Temp (24hrs), Avg:100.8 F (38.2 C), Min:99.6 F (37.6 C), Max:102.8 F (39.3 C)  Recent Labs  Lab 03/12/21 0041 03/12/21 0241  WBC 22.8*  --   CREATININE 1.28*  --   LATICACIDVEN 2.8* 1.7    Estimated Creatinine Clearance: 42 mL/min (A) (by C-G formula based on SCr of 1.28 mg/dL (H)).    Allergies  Allergen Reactions   Doxazosin Mesylate     Other reaction(s): fatigue   Lisinopril     Other reaction(s): cough   Macrolides And Ketolides     Other reaction(s): rash   Codeine Nausea Only     Thank you for allowing pharmacy to be a part of this patients care.  Everette Rank, PharmD 03/12/2021 5:05 AM

## 2021-03-12 NOTE — Subjective & Objective (Signed)
Chief complaint: Fever, altered mental status, abdominal pain History present illness: 87 year old male with a history of BPH, hypertension, CKD stage III who presents to the ER today with a 1 day history of nausea, vomiting, fever, altered mental status. Patient states that about 8:00 this morning he had a ginger candy that he was trying out.  He states that his daughter left it at his house last night for him to try.  He states that as soon as he ate it, he immediately began feeling nauseated and had severe midepigastric pain.  He soon vomited afterwards and started feeling better.  He went to lay down.  Around 1:00 in the afternoon, he developed a fever.  He had some more nausea and vomiting.  His daughter Cecille Rubin called him around 6 PM.  He had been sleeping.  He seemed somewhat groggy on the phone.  When his daughter Cecille Rubin arrived into town from [place] around 11:00 PM, she noted that when she entered the room, the patient was having rigors.  He was confused and altered.  He was febrile.  She called 911 was brought to the hospital. On arrival, patient was febrile to 102.8.  Tachycardic to the 109.  Blood pressure 152/71.  Sats 94%.  Lab evaluation showed a white count of 22.8, hemoglobin 14.5, platelets 283  Initial lactic acid of 2.8, COVID test was negative.  Lipase elevated at 591  Chemistry sodium 137, potassium 3.8, bicarbonate 24  BUN of 24, creatinine 1.28  AST of 516, ALT of 430, alk phos 154, total bili 2.5  Urinalysis was essentially negative.  Chest x-ray with some left basilar atelectasis.  Some old rib fractures on the left-hand side are noted.  CT head demonstrated no acute intracranial hemorrhage.  There is a described subcortical hypodensity left frontal lobe that was not there on February 2015.  Possible age-indeterminate infarct.  CT abdomen demonstrated duodenitis and pancreatitis with fat stranding of the duodenum and the first and second portions.  Patient is status  postcholecystectomy.  Due to acute pancreatitis, possible retained stone, elevated LFTs and sepsis with acute organ dysfunction, [facility] hospitalist contacted for admission.

## 2021-03-12 NOTE — Assessment & Plan Note (Addendum)
Continue with IVF. 30 ml/kg IVF not given in ER due to normal BP.  This was an EDP decision.

## 2021-03-12 NOTE — ED Notes (Signed)
RN contacted the floor about transfer. CN will place a purple man when a receiving RN becomes available.

## 2021-03-12 NOTE — Plan of Care (Signed)
Patient was admitted from the ED and has been educated on safety, nutrition, wound prevention, and keeping the pain under control as much as possible.  Problem: Education: Goal: Knowledge of General Education information will improve Description: Including pain rating scale, medication(s)/side effects and non-pharmacologic comfort measures Outcome: Progressing   Problem: Pain Managment: Goal: General experience of comfort will improve Outcome: Progressing   Problem: Safety: Goal: Ability to remain free from injury will improve Outcome: Progressing

## 2021-03-12 NOTE — Assessment & Plan Note (Deleted)
Stable and chronic. 

## 2021-03-13 DIAGNOSIS — N400 Enlarged prostate without lower urinary tract symptoms: Secondary | ICD-10-CM | POA: Diagnosis not present

## 2021-03-13 DIAGNOSIS — K219 Gastro-esophageal reflux disease without esophagitis: Secondary | ICD-10-CM

## 2021-03-13 DIAGNOSIS — R7881 Bacteremia: Secondary | ICD-10-CM

## 2021-03-13 DIAGNOSIS — B952 Enterococcus as the cause of diseases classified elsewhere: Secondary | ICD-10-CM | POA: Diagnosis not present

## 2021-03-13 DIAGNOSIS — R7989 Other specified abnormal findings of blood chemistry: Secondary | ICD-10-CM | POA: Diagnosis not present

## 2021-03-13 DIAGNOSIS — K859 Acute pancreatitis without necrosis or infection, unspecified: Secondary | ICD-10-CM | POA: Diagnosis not present

## 2021-03-13 DIAGNOSIS — K8309 Other cholangitis: Secondary | ICD-10-CM | POA: Diagnosis not present

## 2021-03-13 LAB — BLOOD CULTURE ID PANEL (REFLEXED) - BCID2

## 2021-03-13 LAB — CBC WITH DIFFERENTIAL/PLATELET
Abs Immature Granulocytes: 0.1 10*3/uL — ABNORMAL HIGH (ref 0.00–0.07)
Basophils Absolute: 0 10*3/uL (ref 0.0–0.1)
Basophils Relative: 0 %
Eosinophils Absolute: 0.2 10*3/uL (ref 0.0–0.5)
Eosinophils Relative: 2 %
HCT: 38.2 % — ABNORMAL LOW (ref 39.0–52.0)
Hemoglobin: 12.7 g/dL — ABNORMAL LOW (ref 13.0–17.0)
Immature Granulocytes: 1 %
Lymphocytes Relative: 10 %
Lymphs Abs: 1.5 10*3/uL (ref 0.7–4.0)
MCH: 29.1 pg (ref 26.0–34.0)
MCHC: 33.2 g/dL (ref 30.0–36.0)
MCV: 87.6 fL (ref 80.0–100.0)
Monocytes Absolute: 1.1 10*3/uL — ABNORMAL HIGH (ref 0.1–1.0)
Monocytes Relative: 7 %
Neutro Abs: 12.1 10*3/uL — ABNORMAL HIGH (ref 1.7–7.7)
Neutrophils Relative %: 80 %
Platelets: 205 10*3/uL (ref 150–400)
RBC: 4.36 MIL/uL (ref 4.22–5.81)
RDW: 13.6 % (ref 11.5–15.5)
WBC: 15.1 10*3/uL — ABNORMAL HIGH (ref 4.0–10.5)
nRBC: 0 % (ref 0.0–0.2)

## 2021-03-13 LAB — COMPREHENSIVE METABOLIC PANEL
ALT: 259 U/L — ABNORMAL HIGH (ref 0–44)
AST: 147 U/L — ABNORMAL HIGH (ref 15–41)
Albumin: 3.2 g/dL — ABNORMAL LOW (ref 3.5–5.0)
Alkaline Phosphatase: 116 U/L (ref 38–126)
Anion gap: 6 (ref 5–15)
BUN: 16 mg/dL (ref 8–23)
CO2: 29 mmol/L (ref 22–32)
Calcium: 8.2 mg/dL — ABNORMAL LOW (ref 8.9–10.3)
Chloride: 102 mmol/L (ref 98–111)
Creatinine, Ser: 1.18 mg/dL (ref 0.61–1.24)
GFR, Estimated: 60 mL/min — ABNORMAL LOW (ref >60)
Glucose, Bld: 96 mg/dL (ref 70–99)
Potassium: 3.6 mmol/L (ref 3.5–5.1)
Sodium: 137 mmol/L (ref 135–145)
Total Bilirubin: 2.3 mg/dL — ABNORMAL HIGH (ref 0.3–1.2)
Total Protein: 6.6 g/dL (ref 6.5–8.1)

## 2021-03-13 LAB — URINE CULTURE: Culture: NO GROWTH

## 2021-03-13 LAB — MAGNESIUM: Magnesium: 1.9 mg/dL (ref 1.7–2.4)

## 2021-03-13 NOTE — Progress Notes (Signed)
PHARMACY - PHYSICIAN COMMUNICATION CRITICAL VALUE ALERT - BLOOD CULTURE IDENTIFICATION (BCID)  Timothy Khan is an 87 y.o. male who presented to [facility] on 03/12/2021 with acute pancreatitis with suspected cholangitis, sepsis on admission  Assessment:  +BCID for Enterococcus faecium (no vancomycin resistance) in 2/4 bottles (one complete set)  Name of physician (or Provider) Contacted: X. Blount, FNP  Current antibiotics: Zosyn 3.375gm IV q8h  Changes to prescribed antibiotics recommended:  No change in antibiotics to narrow therapy at this time due to concern for worsening sepsis noted by MD today  Results for orders placed or performed during the hospital encounter of 03/12/21  Blood Culture ID Panel (Reflexed) (Collected: 03/12/2021 12:41 AM)  Result Value Ref Range   Enterococcus faecalis NOT DETECTED NOT DETECTED   Enterococcus Faecium DETECTED (A) NOT DETECTED   Listeria monocytogenes NOT DETECTED NOT DETECTED   Staphylococcus species NOT DETECTED NOT DETECTED   Staphylococcus aureus (BCID) NOT DETECTED NOT DETECTED   Staphylococcus epidermidis NOT DETECTED NOT DETECTED   Staphylococcus lugdunensis NOT DETECTED NOT DETECTED   Streptococcus species NOT DETECTED NOT DETECTED   Streptococcus agalactiae NOT DETECTED NOT DETECTED   Streptococcus pneumoniae NOT DETECTED NOT DETECTED   Streptococcus pyogenes NOT DETECTED NOT DETECTED   A.calcoaceticus-baumannii NOT DETECTED NOT DETECTED   Bacteroides fragilis NOT DETECTED NOT DETECTED   Enterobacterales NOT DETECTED NOT DETECTED   Enterobacter cloacae complex NOT DETECTED NOT DETECTED   Escherichia coli NOT DETECTED NOT DETECTED   Klebsiella aerogenes NOT DETECTED NOT DETECTED   Klebsiella oxytoca NOT DETECTED NOT DETECTED   Klebsiella pneumoniae NOT DETECTED NOT DETECTED   Proteus species NOT DETECTED NOT DETECTED   Salmonella species NOT DETECTED NOT DETECTED   Serratia marcescens NOT DETECTED NOT DETECTED    Haemophilus influenzae NOT DETECTED NOT DETECTED   Neisseria meningitidis NOT DETECTED NOT DETECTED   Pseudomonas aeruginosa NOT DETECTED NOT DETECTED   Stenotrophomonas maltophilia NOT DETECTED NOT DETECTED   Candida albicans NOT DETECTED NOT DETECTED   Candida auris NOT DETECTED NOT DETECTED   Candida glabrata NOT DETECTED NOT DETECTED   Candida krusei NOT DETECTED NOT DETECTED   Candida parapsilosis NOT DETECTED NOT DETECTED   Candida tropicalis NOT DETECTED NOT DETECTED   Cryptococcus neoformans/gattii NOT DETECTED NOT DETECTED   Vancomycin resistance NOT DETECTED NOT DETECTED    Everette Rank, PharmD 03/13/2021  12:20 AM

## 2021-03-13 NOTE — Progress Notes (Addendum)
PROGRESS NOTE    Timothy Khan  GNF:[id number] DOB: 13-Oct-1931 DOA: 03/12/2021 PCP: [name], MD    Brief Narrative:  Mr. Vosler was admitted to the hospital with the working diagnosis of acute pancreatitis.   87 yo male with the past medical history with the past medical history of BPH, CKD stage 3 and HTN who presented with fever, abdominal pain and altered mental status. Patient developed post pandrial abdominal pain, associated with nausea and vomiting, posteriorly developed fever and was noted to be altered and ill looking appearing. EMS was called and patient was brought to the hospital, on his initial physical examination he was febrile 102.8, HR 109, oxygen saturation 94%, and RR 14 to 18. His lungs were clear to auscultation, heart with S1 and S2 present, abdomen was distended but not tender, no lower extremity edema.   NA 137, K 3,8, CL 105, bicarb 24, glucose 137, BUN 24, Cr 1,28.  Alkaline phosphatase 154, lipase 591, AST 516, ALT 430, total bilirubin is 2.5.  Lactic acid 2.8. White count 22.8, hemoglobin 14.5, hematocrit 43.9, platelets 283. SARS COVID-19 negative.   Urine analysis specific gravity 1.015, negative nitrates, negative leukocytes.   Head CT with subcortical hypodensity in the frontal lobe on the left anteriorly possible infarct of indeterminate age.  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   CT of the abdomen Findings compatible with pancreatitis/duodenitis.  Status postcholecystectomy with fat stranding in the gallbladder fossa. No biliary duct dilatation. Pancreas with fat stranding about the pancreatic head.  No pancreatic duct dilatation.   Chest radiograph no infiltrates.   EKG with 104 bpm, normal axis, normal intervals, sinus rhythm, no significant ST segment or T wave changes.   MRCP with potential small stone within the  common bile duct approximately 1 cm from the ampulla. GI, Dr Michail Sermon was consulted.  Liver enzymes continue to improve, along with symptoms.   Assessment & Plan:   Principal Problem:   Acute pancreatitis Active Problems:   Hypertension   BPH (benign prostatic hyperplasia)   GERD (gastroesophageal reflux disease)   Chronic kidney disease, stage III (moderate) (HCC)   Elevated LFTs -cannot rule out retained/de novo biliary stone   Sepsis with acute organ dysfunction without septic shock (HCC)   Acute pancreatitis. Patient had cholecystectomy on 01/28/21, JP drain was removed on 02/08/21.    Abdominal pain continue to improve, no nausea or vomiting and tolerating clears well. Patient has been afebrile.  AST 147, ALT 259, ALK P 116, Total Bil 2,3 Wbc is 15,1.  MRCP with possible stone in the common bile duct.   Plan to continue conservative medical care with as needed analgesics and antiemetics. Advance diet to soft and follow up liver enzymes in am. Wbc is trending down, will continue antibiotic therapy for now with Zosyn. (On admission had severe sepsis criteria, organ dysfunction elevated liver enzymes)  Follow up cell count in am.  Follow up with GI recommendations.    2. CKD stage 3a.  Renal function with serum cr at 1,18 with K at 3,6 and serum bicarbonate at 29. Continue close follow up on renal function and electrolytes. Off IV fluids.    3. HTN.  Amlodipine for blood pressure control.    4.GERD.  Continue with pantoprazole.    5. BPH.  Continue with enablex and tamsulosin, no signs of urinary retention.    Status is: Inpatient  Remains inpatient appropriate because: follow up on liver enzymes and response to medical therapy   DVT prophylaxis: Enoxaparin   Code Status:    full Family Communication:  I spoke with patient's daughter at the bedside, we talked in detail about patient's condition, plan of care and prognosis and all questions were addressed.      Consultants:  GI    Antimicrobials:  ZOsyn     Subjective: Patient with no nausea or vomiting, his abdominal pain continue to improve, no chest pain or dyspnea.   Objective: Vitals:   03/12/21 2211 03/13/21 0213 03/13/21 0500 03/13/21 0620  BP: (!) 143/78 112/70  114/71  Pulse: 89 81  80  Resp: 20 20  16   Temp: 98.5 F (36.9 C) 98.8 F (37.1 C)  99.5 F (37.5 C)  TempSrc: Oral Oral  Oral  SpO2: 97% 95%  93%  Weight:   79 kg   Height:        Intake/Output Summary (Last 24 hours) at 03/13/2021 1308 Last data filed at 03/13/2021 0900 Gross per 24 hour  Intake 1392 ml  Output 650 ml  Net 742 ml   Filed Weights   03/12/21 1856 03/13/21 0500  Weight: 73.8 kg 79 kg    Examination:   General: Not in pain or dyspnea, deconditioned  Neurology: Awake and alert, non focal  E ENT: mild pallor, no icterus, oral mucosa moist Cardiovascular: No JVD. S1-S2 present, rhythmic, no gallops, rubs, or murmurs. No lower extremity edema. Pulmonary: positive breath sounds bilaterally, with no wheezing, rhonchi or rales. Gastrointestinal. Abdomen soft and non tender to superficial palpation no rebound or guarding Skin. No rashes Musculoskeletal: no joint deformities     Data Reviewed: I have personally reviewed following labs and imaging studies  CBC: Recent Labs  Lab 03/12/21 0041 03/13/21 0552  WBC 22.8* 15.1*  NEUTROABS 21.0* 12.1*  HGB 14.5 12.7*  HCT 43.9 38.2*  MCV 87.3 87.6  PLT 283 081   Basic Metabolic Panel: Recent Labs  Lab 03/12/21 0041 03/13/21 0552  NA 137 137  K 3.8 3.6  CL 105 102  CO2 24 29  GLUCOSE 137* 96  BUN 24* 16  CREATININE 1.28* 1.18  CALCIUM 9.1 8.2*  MG  --  1.9   GFR: Estimated Creatinine Clearance: 45.5 mL/min (by C-G formula based on SCr of 1.18 mg/dL). Liver Function Tests: Recent Labs  Lab 03/12/21 0041 03/13/21 0552  AST 516* 147*  ALT 430* 259*  ALKPHOS 154* 116  BILITOT 2.5* 2.3*  PROT 8.7* 6.6  ALBUMIN 4.7  3.2*   Recent Labs  Lab 03/12/21 0041  LIPASE 591*   No results for input(s): AMMONIA in the last 168 hours. Coagulation Profile: Recent Labs  Lab 03/12/21 0041  INR 1.0   Cardiac Enzymes: No results for input(s): CKTOTAL, CKMB, CKMBINDEX, TROPONINI in the last 168 hours. BNP (last 3 results) No results for input(s): PROBNP in the last 8760 hours. HbA1C: No results for input(s): HGBA1C in the last 72 hours. CBG: No results for input(s): GLUCAP in the last 168 hours. Lipid Profile: No results for input(s): CHOL, HDL, LDLCALC, TRIG, CHOLHDL, LDLDIRECT in the last 72 hours. Thyroid Function Tests: No results for input(s): TSH, T4TOTAL, FREET4, T3FREE, THYROIDAB in the last 72 hours. Anemia Panel: No results for input(s): VITAMINB12, FOLATE, FERRITIN, TIBC, IRON, RETICCTPCT in the last 72 hours.    Radiology Studies: I have reviewed all of the imaging during this hospital visit personally  Scheduled Meds:  amLODipine  5 mg Oral QHS   darifenacin  7.5 mg Oral Daily   gabapentin  600 mg Oral QHS   pantoprazole  40 mg Oral Daily   tamsulosin  0.4 mg Oral QHS   Continuous Infusions:  piperacillin-tazobactam (ZOSYN)  IV 3.375 g (03/13/21 0901)     LOS: 1 day        Epsie Walthall Gerome Apley, MD

## 2021-03-13 NOTE — Consult Note (Signed)
[name]  Total days of antibiotics 2/ piptazo Reason for Consult:enterococcal bacteremia   Referring Physician: arrien  Principal Problem:   Acute pancreatitis Active Problems:   Hypertension   BPH (benign prostatic hyperplasia)   GERD (gastroesophageal reflux disease)   Chronic kidney disease, stage III (moderate) (HCC)   Elevated LFTs -cannot rule out retained/de novo biliary stone   Sepsis with acute organ dysfunction without septic shock (HCC)    HPI: Timothy Khan is a 87 y.o. male with hx of HTN, BPH, GERD, hx/ of Acute on Chronic Calculus Cholecystitis Empyema of the gallbladder s.p lap chole on 11/11 by Dr. Johney Maine. Patient having intermitted abdominal pain in early December which ruled out stones, or intra-abdominal process on 12/2 CT. He was then readmitted on 12/24 for acute onset of abdominal pain nausea and vomiting with fevers up to 102.& and associated confusion. On admit, labs showed WBC of 22.8K, transaminitis, lipase 590s, LA 2.8, consistent with pancreatitis. Repeat abd CT now shows stranding in GB fossa and inflammation at pancreatic head. Due to concern for cholangitis, he was started on piptazo. His infectious work up showed + blood cx with e.faecium in 1 set of blood cx thus far. MR-abd showed small stone within CBD appx 1cm from ampulla. Minimal pancreatic inflammation. Patient is feeling better. Transaminitis starting to improve  Past Medical History:  Diagnosis Date   Abnormal brain MRI    Right side   Basal cell carcinoma of back    BPH (benign prostatic hyperplasia)    Chronic cough    CKD (chronic kidney disease), stage III ([facility])    Concussion 1950   Baseball injury   Diastolic dysfunction    ED (erectile dysfunction)    Foot drop, bilateral 05/02/2013   Gait disorder    Gait disturbance    Gastroesophageal reflux disease    Hyperlipidemia    Hypertension    Libido, decreased    Lumbago    Memory loss     Onychomycosis    OSA (obstructive sleep apnea)    Peripheral neuropathy    PSA elevation    SDH (subdural hematoma) 04/30/2013   Skull fracture (HCC)    Skull fracture (HCC)    Sleep apnea     Allergies:  Allergies  Allergen Reactions   Doxazosin Mesylate     Other reaction(s): fatigue   Lisinopril     Other reaction(s): cough   Macrolides And Ketolides     Other reaction(s): rash   Codeine Nausea Only   MEDICATIONS:  amLODipine  5 mg Oral QHS   darifenacin  7.5 mg Oral Daily   gabapentin  600 mg Oral QHS   pantoprazole  40 mg Oral Daily   tamsulosin  0.4 mg Oral QHS    Social History   Tobacco Use   Smoking status: Former    Types: Cigarettes    Quit date: 08/18/1984    Years since quitting: 36.5   Smokeless tobacco: Never  Vaping Use   Vaping Use: Never used  Substance Use Topics   Alcohol use: No   Drug use: No    Family History  Problem Relation Age of Onset   Cancer Mother        Breast cancer   Heart attack Father 70   CAD Father    Heart disease Brother    Lupus Daughter     Review of Systems -   Constitutional: positive for fever, chills, diaphoresis, activity  change, appetite change, fatigue and unexpected weight change.  HENT: Negative for congestion, sore throat, rhinorrhea, sneezing, trouble swallowing and sinus pressure.  Eyes: Negative for photophobia and visual disturbance.  Respiratory: Negative for cough, chest tightness, shortness of breath, wheezing and stridor.  Cardiovascular: Negative for chest pain, palpitations and leg swelling.  Gastrointestinal: positive for nausea, vomiting, abdominal pain, diarrhea, constipation, blood in stool, abdominal distention and anal bleeding.  Genitourinary: Negative for dysuria, hematuria, flank pain and difficulty urinating.  Musculoskeletal: Negative for myalgias, back pain, joint swelling, arthralgias and gait problem.  Skin: Negative for color change, pallor, rash and wound.  Neurological:  Negative for dizziness, tremors, weakness and light-headedness.  Hematological: Negative for adenopathy. Does not bruise/bleed easily.  Psychiatric/Behavioral: Negative for behavioral problems, confusion, sleep disturbance, dysphoric mood, decreased concentration and agitation.    OBJECTIVE: Temp:  [98.1 F (36.7 C)-99.5 F (37.5 C)] 98.9 F (37.2 C) (12/25 1432) Pulse Rate:  [75-93] 75 (12/25 1432) Resp:  [16-20] 18 (12/25 1432) BP: (112-143)/(61-78) 116/61 (12/25 1432) SpO2:  [93 %-97 %] 94 % (12/25 1432) Weight:  [73.8 kg-79 kg] 79 kg (12/25 0500) Physical Exam  Constitutional: He is oriented to person, place, and time. He appears well-developed and well-nourished. No distress.  HENT:  Mouth/Throat: Oropharynx is clear and moist. No oropharyngeal exudate.  Cardiovascular: Normal rate, regular rhythm and normal heart sounds. Exam reveals no gallop and no friction rub.  No murmur heard.  Pulmonary/Chest: Effort normal and breath sounds normal. No respiratory distress. He has no wheezes.  Abdominal: Soft. Bowel sounds are normal. He exhibits no distension. There is no tenderness.  Lymphadenopathy:  He has no cervical adenopathy.  Neurological: He is alert and oriented to person, place, and time.  Skin: Skin is warm and dry. No rash noted. No erythema.  Psychiatric: He has a normal mood and affect. His behavior is normal.   LABS: Results for orders placed or performed during the hospital encounter of 03/12/21 (from the past 48 hour(s))  Lactic acid, plasma     Status: Abnormal   Collection Time: 03/12/21 12:41 AM  Result Value Ref Range   Lactic Acid, Venous 2.8 (HH) 0.5 - 1.9 mmol/L    Comment: CRITICAL RESULT CALLED TO, READ BACK BY AND VERIFIED WITH: KISER,C. 03/12/21 @0209  BY SEEL,M Performed at [facility], [place] [address] [place], [place] [address]   Comprehensive metabolic panel     Status: Abnormal   Collection Time: 03/12/21 12:41 AM  Result Value  Ref Range   Sodium 137 135 - 145 mmol/L   Potassium 3.8 3.5 - 5.1 mmol/L   Chloride 105 98 - 111 mmol/L   CO2 24 22 - 32 mmol/L   Glucose, Bld 137 (H) 70 - 99 mg/dL    Comment: Glucose reference range applies only to samples taken after fasting for at least 8 hours.   BUN 24 (H) 8 - 23 mg/dL   Creatinine, Ser 1.28 (H) 0.61 - 1.24 mg/dL   Calcium 9.1 8.9 - 10.3 mg/dL   Total Protein 8.7 (H) 6.5 - 8.1 g/dL   Albumin 4.7 3.5 - 5.0 g/dL   AST 516 (H) 15 - 41 U/L   ALT 430 (H) 0 - 44 U/L   Alkaline Phosphatase 154 (H) 38 - 126 U/L   Total Bilirubin 2.5 (H) 0.3 - 1.2 mg/dL   GFR, Estimated 54 (L) >60 mL/min    Comment: (NOTE) Calculated using the CKD-EPI Creatinine Equation (2021)    Anion gap  8 5 - 15    Comment: Performed at [facility], [place] [address] [place], [place] [address]  CBC WITH DIFFERENTIAL     Status: Abnormal   Collection Time: 03/12/21 12:41 AM  Result Value Ref Range   WBC 22.8 (H) 4.0 - 10.5 K/uL   RBC 5.03 4.22 - 5.81 MIL/uL   Hemoglobin 14.5 13.0 - 17.0 g/dL   HCT 43.9 39.0 - 52.0 %   MCV 87.3 80.0 - 100.0 fL   MCH 28.8 26.0 - 34.0 pg   MCHC 33.0 30.0 - 36.0 g/dL   RDW 13.3 11.5 - 15.5 %   Platelets 283 150 - 400 K/uL   nRBC 0.0 0.0 - 0.2 %   Neutrophils Relative % 92 %   Neutro Abs 21.0 (H) 1.7 - 7.7 K/uL   Lymphocytes Relative 2 %   Lymphs Abs 0.4 (L) 0.7 - 4.0 K/uL   Monocytes Relative 4 %   Monocytes Absolute 0.9 0.1 - 1.0 K/uL   Eosinophils Relative 0 %   Eosinophils Absolute 0.0 0.0 - 0.5 K/uL   Basophils Relative 0 %   Basophils Absolute 0.0 0.0 - 0.1 K/uL   Immature Granulocytes 2 %   Abs Immature Granulocytes 0.38 (H) 0.00 - 0.07 K/uL    Comment: Performed at [facility], [place] [address], [place], [place] [address]  Protime-INR     Status: None   Collection Time: 03/12/21 12:41 AM  Result Value Ref Range   Prothrombin Time 13.1 11.4 - 15.2 seconds   INR 1.0 0.8 - 1.2    Comment: (NOTE) INR goal varies  based on device and disease states. Performed at [facility], [place] [address] [place], [place] [address]   APTT     Status: None   Collection Time: 03/12/21 12:41 AM  Result Value Ref Range   aPTT 28 24 - 36 seconds    Comment: Performed at [facility], [place] [address], [place], [place] [address]  Blood Culture (routine x 2)     Status: Abnormal (Preliminary result)   Collection Time: 03/12/21 12:41 AM   Specimen: BLOOD  Result Value Ref Range   Specimen Description      BLOOD RIGHT ANTECUBITAL Performed at [facility] [address], [place], [place] [address]    Special Requests      BOTTLES DRAWN AEROBIC AND ANAEROBIC Blood Culture results may not be optimal due to an excessive volume of blood received in culture bottles Performed at [facility] [address], [place], [place] [address]    Culture  Setup Time      GRAM POSITIVE COCCI IN PAIRS AND CHAINS IN BOTH AEROBIC AND ANAEROBIC BOTTLES CRITICAL RESULT CALLED TO, READ BACK BY AND VERIFIED WITH: L POINDEXTER,[email or web address]@[email or web address]  03/12/21 [place]    Culture (A)     ENTEROCOCCUS FAECIUM SUSCEPTIBILITIES TO FOLLOW Performed at [facility], [place] [address] [place], [place] [address]    Report Status PENDING   Urinalysis, Routine w reflex microscopic Urine, Clean Catch     Status: Abnormal   Collection Time: 03/12/21 12:41 AM  Result Value Ref Range   Color, Urine YELLOW YELLOW   APPearance CLEAR CLEAR   Specific Gravity, Urine 1.015 1.005 - 1.030   pH 6.0 5.0 - 8.0   Glucose, UA NEGATIVE NEGATIVE mg/dL   Hgb urine dipstick TRACE (A) NEGATIVE   Bilirubin Urine NEGATIVE NEGATIVE   Ketones, ur NEGATIVE  NEGATIVE mg/dL   Protein, ur 100 (A) NEGATIVE mg/dL   Nitrite NEGATIVE NEGATIVE   Leukocytes,Ua NEGATIVE NEGATIVE   RBC / HPF 0-5 0 - 5 RBC/hpf   WBC, UA 0-5 0 - 5 WBC/hpf   Bacteria, UA NONE SEEN NONE SEEN   Hyaline Casts, UA PRESENT      Comment: Performed at [facility], [place] [address], [place], [place] [address]  Urine Culture     Status: None   Collection Time: 03/12/21 12:41 AM   Specimen: In/Out Cath Urine  Result Value Ref Range   Specimen Description      IN/OUT CATH URINE Performed at [facility], [place] [address], [place], [place] [address]    Special Requests      NONE Performed at [facility], [place] [address], [place], [place] [address]    Culture      NO GROWTH Performed at [facility], [place] [address], [place], [place] [address]    Report Status 03/13/2021 FINAL   Lipase, blood     Status: Abnormal   Collection Time: 03/12/21 12:41 AM  Result Value Ref Range   Lipase 591 (H) 11 - 51 U/L    Comment: RESULTS CONFIRMED BY MANUAL DILUTION Performed at [facility], [place] [address], [place], [place] [address]   Resp Panel by RT-PCR (Flu A&B, Covid) Nasopharyngeal Swab     Status: None   Collection Time: 03/12/21 12:41 AM   Specimen: Nasopharyngeal Swab; Nasopharyngeal(NP) swabs in vial transport medium  Result Value Ref Range   SARS Coronavirus 2 by RT PCR NEGATIVE NEGATIVE    Comment: (NOTE) SARS-CoV-2 target nucleic acids are NOT DETECTED.  The SARS-CoV-2 RNA is generally detectable in upper respiratory specimens during the acute phase of infection. The lowest concentration of SARS-CoV-2 viral copies this assay can detect is 138 copies/mL. A negative result does not preclude SARS-Cov-2 infection and should not be used as the sole basis for treatment or other patient management decisions. A negative result may occur with  improper specimen collection/handling, submission of specimen other than nasopharyngeal swab, presence of viral mutation(s) within the areas targeted by this assay, and inadequate number of viral copies(<138 copies/mL). A negative result must be combined with clinical observations, patient history,  and epidemiological information. The expected result is Negative.  Fact Sheet for Patients:  EntrepreneurPulse.com.au  Fact Sheet for Healthcare Providers:  IncredibleEmployment.be  This test is no t yet approved or cleared by the [place] FDA and  has been authorized for detection and/or diagnosis of SARS-CoV-2 by FDA under an Emergency Use Authorization (EUA). This EUA will remain  in effect (meaning this test can be used) for the duration of the COVID-19 declaration under Section 564(b)(1) of the Act, 21 U.S.C.section 360bbb-3(b)(1), unless the authorization is terminated  or revoked sooner.       Influenza A by PCR NEGATIVE NEGATIVE   Influenza B by PCR NEGATIVE NEGATIVE    Comment: (NOTE) The Xpert Xpress SARS-CoV-2/FLU/RSV plus assay is intended as an aid in the diagnosis of influenza from Nasopharyngeal swab specimens and should not be used as a sole basis for treatment. Nasal washings and aspirates are unacceptable for Xpert Xpress SARS-CoV-2/FLU/RSV testing.  Fact Sheet for Patients: EntrepreneurPulse.com.au  Fact Sheet for Healthcare Providers: IncredibleEmployment.be  This test is not yet approved or cleared by the [place] FDA and has been authorized for detection and/or diagnosis of SARS-CoV-2 by FDA under an Emergency  Use Authorization (EUA). This EUA will remain in effect (meaning this test can be used) for the duration of the COVID-19 declaration under Section 564(b)(1) of the Act, 21 U.S.C. section 360bbb-3(b)(1), unless the authorization is terminated or revoked.  Performed at [facility], [place] [address], [place], [place] [address]   Blood Culture ID Panel (Reflexed)     Status: Abnormal   Collection Time: 03/12/21 12:41 AM  Result Value Ref Range   Enterococcus faecalis NOT DETECTED NOT DETECTED   Enterococcus Faecium DETECTED (A) NOT DETECTED     Comment: CRITICAL RESULT CALLED TO, READ BACK BY AND VERIFIED WITH: L POINDEXTER,[email or web address]  03/12/21 [place]    Listeria monocytogenes NOT DETECTED NOT DETECTED   Staphylococcus species NOT DETECTED NOT DETECTED   Staphylococcus aureus (BCID) NOT DETECTED NOT DETECTED   Staphylococcus epidermidis NOT DETECTED NOT DETECTED   Staphylococcus lugdunensis NOT DETECTED NOT DETECTED   Streptococcus species NOT DETECTED NOT DETECTED   Streptococcus agalactiae NOT DETECTED NOT DETECTED   Streptococcus pneumoniae NOT DETECTED NOT DETECTED   Streptococcus pyogenes NOT DETECTED NOT DETECTED   A.calcoaceticus-baumannii NOT DETECTED NOT DETECTED   Bacteroides fragilis NOT DETECTED NOT DETECTED   Enterobacterales NOT DETECTED NOT DETECTED   Enterobacter cloacae complex NOT DETECTED NOT DETECTED   Escherichia coli NOT DETECTED NOT DETECTED   Klebsiella aerogenes NOT DETECTED NOT DETECTED   Klebsiella oxytoca NOT DETECTED NOT DETECTED   Klebsiella pneumoniae NOT DETECTED NOT DETECTED   Proteus species NOT DETECTED NOT DETECTED   Salmonella species NOT DETECTED NOT DETECTED   Serratia marcescens NOT DETECTED NOT DETECTED   Haemophilus influenzae NOT DETECTED NOT DETECTED   Neisseria meningitidis NOT DETECTED NOT DETECTED   Pseudomonas aeruginosa NOT DETECTED NOT DETECTED   Stenotrophomonas maltophilia NOT DETECTED NOT DETECTED   Candida albicans NOT DETECTED NOT DETECTED   Candida auris NOT DETECTED NOT DETECTED   Candida glabrata NOT DETECTED NOT DETECTED   Candida krusei NOT DETECTED NOT DETECTED   Candida parapsilosis NOT DETECTED NOT DETECTED   Candida tropicalis NOT DETECTED NOT DETECTED   Cryptococcus neoformans/gattii NOT DETECTED NOT DETECTED   Vancomycin resistance NOT DETECTED NOT DETECTED    Comment: Performed at [facility], [address], [place], [place] [address]  Blood Culture (routine x 2)     Status: None (Preliminary result)   Collection Time: 03/12/21 12:46 AM   Specimen:  BLOOD  Result Value Ref Range   Specimen Description      BLOOD RIGHT WRIST Performed at [facility] [address] [place], [place] [address]    Special Requests      BOTTLES DRAWN AEROBIC AND ANAEROBIC Blood Culture adequate volume Performed at [facility] [address] [place], [place] [address]    Culture      NO GROWTH 1 DAY Performed at [facility] [address] [place], [place] [address]    Report Status PENDING   Lactic acid, plasma     Status: None   Collection Time: 03/12/21  2:41 AM  Result Value Ref Range   Lactic Acid, Venous 1.7 0.5 - 1.9 mmol/L    Comment: Performed at [facility], [place] [address], [place], [place] [address]  CBC with Differential/Platelet     Status: Abnormal   Collection Time: 03/13/21  5:52 AM  Result Value Ref Range   WBC 15.1 (H) 4.0 - 10.5 K/uL   RBC 4.36 4.22 - 5.81 MIL/uL  Hemoglobin 12.7 (L) 13.0 - 17.0 g/dL   HCT 38.2 (L) 39.0 - 52.0 %   MCV 87.6 80.0 - 100.0 fL   MCH 29.1 26.0 - 34.0 pg   MCHC 33.2 30.0 - 36.0 g/dL   RDW 13.6 11.5 - 15.5 %   Platelets 205 150 - 400 K/uL   nRBC 0.0 0.0 - 0.2 %   Neutrophils Relative % 80 %   Neutro Abs 12.1 (H) 1.7 - 7.7 K/uL   Lymphocytes Relative 10 %   Lymphs Abs 1.5 0.7 - 4.0 K/uL   Monocytes Relative 7 %   Monocytes Absolute 1.1 (H) 0.1 - 1.0 K/uL   Eosinophils Relative 2 %   Eosinophils Absolute 0.2 0.0 - 0.5 K/uL   Basophils Relative 0 %   Basophils Absolute 0.0 0.0 - 0.1 K/uL   Immature Granulocytes 1 %   Abs Immature Granulocytes 0.10 (H) 0.00 - 0.07 K/uL    Comment: Performed at [facility], [place] [address] [place], [place] [address]  Comprehensive metabolic panel     Status: Abnormal   Collection Time: 03/13/21  5:52 AM  Result Value Ref Range   Sodium 137 135 - 145 mmol/L   Potassium 3.6 3.5 - 5.1 mmol/L   Chloride 102 98 - 111 mmol/L   CO2 29 22 - 32 mmol/L   Glucose, Bld 96 70 - 99  mg/dL    Comment: Glucose reference range applies only to samples taken after fasting for at least 8 hours.   BUN 16 8 - 23 mg/dL   Creatinine, Ser 1.18 0.61 - 1.24 mg/dL   Calcium 8.2 (L) 8.9 - 10.3 mg/dL   Total Protein 6.6 6.5 - 8.1 g/dL   Albumin 3.2 (L) 3.5 - 5.0 g/dL   AST 147 (H) 15 - 41 U/L   ALT 259 (H) 0 - 44 U/L   Alkaline Phosphatase 116 38 - 126 U/L   Total Bilirubin 2.3 (H) 0.3 - 1.2 mg/dL   GFR, Estimated 60 (L) >60 mL/min    Comment: (NOTE) Calculated using the CKD-EPI Creatinine Equation (2021)    Anion gap 6 5 - 15    Comment: Performed at [facility], [place] [address] [place], [place] [address]  Magnesium     Status: None   Collection Time: 03/13/21  5:52 AM  Result Value Ref Range   Magnesium 1.9 1.7 - 2.4 mg/dL    Comment: Performed at [facility], [place] [address] [place], [place] [address]    MICRO:  IMAGING: CT Head Wo Contrast  Result Date: 03/12/2021 CLINICAL DATA:  Mental status change, unknown cause. EXAM: CT HEAD WITHOUT CONTRAST TECHNIQUE: Contiguous axial images were obtained from the base of the skull through the vertex without intravenous contrast. COMPARISON:  05/08/2013. FINDINGS: Brain: No acute intracranial hemorrhage, midline shift or mass effect. No extra-axial fluid collection. Mild atrophy is noted. Extensive subcortical and periventricular white matter hypodensities are seen bilaterally. There is mild encephalomalacia in the frontal lobe on the right, unchanged from the prior exam. There is a lacunar infarct in the thalamus on the right. A hypodensity is noted in the subcortical white matter in the anterior frontal lobe on the left, indeterminate in age. No hydrocephalus. Vascular: There is atherosclerotic calcification of the carotid siphons and vertebral arteries. No hyperdense vessel. Skull: Normal. Negative for fracture or focal lesion. Sinuses/Orbits: No acute finding. Other: None. IMPRESSION: 1.  Subcortical hypodensity in the frontal lobe on the left  anteriorly, possible infarct of indeterminate age. Comparison with more recent imaging studies or MRI is suggested for further evaluation. 2. 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 Electronically Signed   By: Brett Fairy M.D.   On: 03/12/2021 03:37   CT Abdomen Pelvis W Contrast  Result Date: 03/12/2021 CLINICAL DATA:  Abdominal pain, fever, and vomiting. EXAM: CT ABDOMEN AND PELVIS WITH CONTRAST TECHNIQUE: Multidetector CT imaging of the abdomen and pelvis was performed using the standard protocol following bolus administration of intravenous contrast. CONTRAST:  23mL OMNIPAQUE IOHEXOL 350 MG/ML SOLN COMPARISON:  02/18/2021. FINDINGS: Lower chest: Bilateral lower lobe bronchiectasis. Strandy atelectasis is present at the lung bases. The heart is normal in size. Hepatobiliary: Stable hypodensities are present in the liver, likely cysts. No biliary ductal dilatation. Cholecystectomy changes are noted with mild fat stranding in the right upper quadrant. Pancreas: Fat stranding is noted about the pancreatic head. No pancreatic ductal dilatation. Spleen: Normal in size without focal abnormality. Adrenals/Urinary Tract: No adrenal nodule or mass. The kidneys enhance symmetrically. No renal calculus or hydronephrosis. Extrarenal pelvises are noted bilaterally. The bladder is unremarkable. Stomach/Bowel: There is a small hiatal hernia. The stomach is otherwise within normal limits. Fat stranding is noted about the first and second portions of the duodenum suggesting duodenitis. No bowel obstruction, free air or pneumatosis. Scattered diverticula are noted along the colon without evidence of diverticulitis. The appendix is prominent in size at 8 mm with no surrounding inflammatory changes, stable from the previous exam. Vascular/Lymphatic: A few  prominent lymph nodes are noted at the porta hepatis, likely reactive. Aortic atherosclerosis without evidence of aneurysm. Reproductive: Prostate gland is enlarged. Other: No free fluid. Musculoskeletal: Degenerative changes are present in the thoracic spine. A stable compression deformity is present at L1. A mild compression deformity is present in the superior endplate at W11, unchanged. No acute osseous abnormality. IMPRESSION: 1. Findings compatible with pancreatitis/duodenitis. 2. Status post cholecystectomy with fat stranding at the gallbladder fossa, unchanged. 3. Small hiatal hernia. 4. Prominent appendix at 8 mm in diameter, unchanged from the previous exam and likely normal variant. 5. Remaining chronic findings as described above. Electronically Signed   By: Brett Fairy M.D.   On: 03/12/2021 03:50   MR 3D Recon At Scanner  Result Date: 03/12/2021 CLINICAL DATA:  Elevated. Rubin. Elevated lipase. Elevated white blood cell count. Findings concerning for acute pancreatitis. Evaluate for gallstone pancreatitis. EXAM: MRI ABDOMEN WITHOUT AND WITH CONTRAST (INCLUDING MRCP) TECHNIQUE: Multiplanar multisequence MR imaging of the abdomen was performed both before and after the administration of intravenous contrast. Heavily T2-weighted images of the biliary and pancreatic ducts were obtained, and three-dimensional MRCP images were rendered by post processing. CONTRAST:  73mL GADAVIST GADOBUTROL 1 MMOL/ML IV SOLN COMPARISON:  03/12/2021 FINDINGS: MRCP images are degraded by respiratory motion Lower chest:  Lung bases are clear. Hepatobiliary: MRCP images are degraded by respiratory motion. The best series for evaluation of the extrahepatic bile ducts is the coronal haste imaging (series 5). On this T2 weighted series the common bile duct is normal to 5 mm (image 13/5). The common hepatic duct is normal caliber. Along the dorsal surface of the duct there is a small filling defect (image 14/5). This small  defect is approximately 1.5 cm from the ampulla. Similar small potential defect is noted on the axial T2 weighted fat-sat series 6 on image 27. Similar small defect noted  on the trueFISP imaging (image 21/series 10). Finally on the MRCP sequences (again these are degraded by respiratory motion collection there is suggestion of filling defect is similar level (image 18/series 11). This small filling defect measures approximately 3 mm. Patient status post cholecystectomy. There is no intrahepatic biliary duct dilatation No enhancing lesion within liver. Multiple benign nonenhancing cysts. Pancreas: Pancreatic duct is normal caliber. Minimal pancreatic inflammation on MRI imaging. Small amount fluid along the anterior RIGHT pararenal space towards Morrison's pouch. Spleen: Normal spleen. Adrenals/urinary tract: Adrenal glands and kidneys are normal. Stomach/Bowel: Stomach and limited of the small bowel is unremarkable Vascular/Lymphatic: Abdominal aortic normal caliber. No retroperitoneal periportal lymphadenopathy. Musculoskeletal: No aggressive osseous lesion IMPRESSION: 1. Potential small stone within the common bile duct approximately 1 cm from the ampulla. No intra or extrahepatic biliary duct dilatation. 2. Postcholecystectomy. 3. Minimal pancreatic inflammation with small amount fluid along the anterior RIGHT perirenal space. No organized fluid collections. No pancreatic duct dilatation. 4. Multiple benign appearing hepatic cysts. Electronically Signed   By: Suzy Bouchard M.D.   On: 03/12/2021 14:18   DG Chest Port 1 View  Result Date: 03/12/2021 CLINICAL DATA:  Possible sepsis with fevers and vomiting, initial encounter EXAM: PORTABLE CHEST 1 VIEW COMPARISON:  02/18/2021 FINDINGS: Cardiac shadow is stable. Lungs are well aerated bilaterally. Minimal left basilar atelectasis is noted. No focal confluent infiltrate is seen. Old rib fractures on the left are noted. IMPRESSION: New left basilar atelectasis.   No other focal abnormality is noted. Electronically Signed   By: Inez Catalina M.D.   On: 03/12/2021 01:52   MR ABDOMEN MRCP W WO CONTAST  Result Date: 03/12/2021 CLINICAL DATA:  Elevated. Rubin. Elevated lipase. Elevated white blood cell count. Findings concerning for acute pancreatitis. Evaluate for gallstone pancreatitis. EXAM: MRI ABDOMEN WITHOUT AND WITH CONTRAST (INCLUDING MRCP) TECHNIQUE: Multiplanar multisequence MR imaging of the abdomen was performed both before and after the administration of intravenous contrast. Heavily T2-weighted images of the biliary and pancreatic ducts were obtained, and three-dimensional MRCP images were rendered by post processing. CONTRAST:  56mL GADAVIST GADOBUTROL 1 MMOL/ML IV SOLN COMPARISON:  03/12/2021 FINDINGS: MRCP images are degraded by respiratory motion Lower chest:  Lung bases are clear. Hepatobiliary: MRCP images are degraded by respiratory motion. The best series for evaluation of the extrahepatic bile ducts is the coronal haste imaging (series 5). On this T2 weighted series the common bile duct is normal to 5 mm (image 13/5). The common hepatic duct is normal caliber. Along the dorsal surface of the duct there is a small filling defect (image 14/5). This small defect is approximately 1.5 cm from the ampulla. Similar small potential defect is noted on the axial T2 weighted fat-sat series 6 on image 27. Similar small defect noted on the trueFISP imaging (image 21/series 10). Finally on the MRCP sequences (again these are degraded by respiratory motion collection there is suggestion of filling defect is similar level (image 18/series 11). This small filling defect measures approximately 3 mm. Patient status post cholecystectomy. There is no intrahepatic biliary duct dilatation No enhancing lesion within liver. Multiple benign nonenhancing cysts. Pancreas: Pancreatic duct is normal caliber. Minimal pancreatic inflammation on MRI imaging. Small amount fluid along  the anterior RIGHT pararenal space towards Morrison's pouch. Spleen: Normal spleen. Adrenals/urinary tract: Adrenal glands and kidneys are normal. Stomach/Bowel: Stomach and limited of the small bowel is unremarkable Vascular/Lymphatic: Abdominal aortic normal caliber. No retroperitoneal periportal lymphadenopathy. Musculoskeletal: No aggressive osseous lesion IMPRESSION: 1. Potential small stone  within the common bile duct approximately 1 cm from the ampulla. No intra or extrahepatic biliary duct dilatation. 2. Postcholecystectomy. 3. Minimal pancreatic inflammation with small amount fluid along the anterior RIGHT perirenal space. No organized fluid collections. No pancreatic duct dilatation. 4. Multiple benign appearing hepatic cysts. Electronically Signed   By: Suzy Bouchard M.D.   On: 03/12/2021 14:18     Assessment/Plan:  87yo M admitted with sepsis from pancreatitis, obstructive cholangitis and secondary e.faecium bacteremia. Not uncommon to have retained stone after having lap cholecystectomy.  - continue on piptazo - recommend TTE fopr now - repeat blood cx tonight to ensure bacteremia is clearing - likely will only need 2 wks of abtx due to acute onset, and source identified - will defer to GI to see if needs ERCP, patient appears to have some improvement and wonder if he is clearing the stone already.  Leukocytosis = anticipate to improve with ivf, abtx, and recovering from pancreatitis

## 2021-03-13 NOTE — Progress Notes (Signed)
Pt is injury free, afebrile, alert, and oriented X 4. Vital signs were within the baseline during this shift. His abdominal pain is controlled with current pain regimen. He denies chest pain, SOB, nausea, vomiting, dizziness, signs or symptoms of bleeding or infection or acute changes during this shift. We will continue to monitor and work toward achieving the care plan goals

## 2021-03-13 NOTE — Plan of Care (Signed)

## 2021-03-14 ENCOUNTER — Inpatient Hospital Stay (HOSPITAL_COMMUNITY): Payer: Medicare Other

## 2021-03-14 DIAGNOSIS — K8309 Other cholangitis: Secondary | ICD-10-CM | POA: Diagnosis not present

## 2021-03-14 DIAGNOSIS — R7989 Other specified abnormal findings of blood chemistry: Secondary | ICD-10-CM | POA: Diagnosis not present

## 2021-03-14 DIAGNOSIS — R7881 Bacteremia: Secondary | ICD-10-CM | POA: Diagnosis not present

## 2021-03-14 DIAGNOSIS — N400 Enlarged prostate without lower urinary tract symptoms: Secondary | ICD-10-CM | POA: Diagnosis not present

## 2021-03-14 DIAGNOSIS — K859 Acute pancreatitis without necrosis or infection, unspecified: Secondary | ICD-10-CM | POA: Diagnosis not present

## 2021-03-14 DIAGNOSIS — B952 Enterococcus as the cause of diseases classified elsewhere: Secondary | ICD-10-CM | POA: Diagnosis not present

## 2021-03-14 DIAGNOSIS — K219 Gastro-esophageal reflux disease without esophagitis: Secondary | ICD-10-CM | POA: Diagnosis not present

## 2021-03-14 DIAGNOSIS — I10 Essential (primary) hypertension: Secondary | ICD-10-CM | POA: Diagnosis not present

## 2021-03-14 LAB — COMPREHENSIVE METABOLIC PANEL
ALT: 172 U/L — ABNORMAL HIGH (ref 0–44)
AST: 63 U/L — ABNORMAL HIGH (ref 15–41)
Albumin: 3.1 g/dL — ABNORMAL LOW (ref 3.5–5.0)
Alkaline Phosphatase: 114 U/L (ref 38–126)
Anion gap: 6 (ref 5–15)
BUN: 19 mg/dL (ref 8–23)
CO2: 28 mmol/L (ref 22–32)
Calcium: 8.3 mg/dL — ABNORMAL LOW (ref 8.9–10.3)
Chloride: 102 mmol/L (ref 98–111)
Creatinine, Ser: 1.37 mg/dL — ABNORMAL HIGH (ref 0.61–1.24)
GFR, Estimated: 50 mL/min — ABNORMAL LOW (ref >60)
Glucose, Bld: 107 mg/dL — ABNORMAL HIGH (ref 70–99)
Potassium: 3.5 mmol/L (ref 3.5–5.1)
Sodium: 136 mmol/L (ref 135–145)
Total Bilirubin: 1.2 mg/dL (ref 0.3–1.2)
Total Protein: 6.6 g/dL (ref 6.5–8.1)

## 2021-03-14 LAB — ECHOCARDIOGRAM COMPLETE
AR max vel: 3.17 cm2
AV Area VTI: 3.08 cm2
AV Area mean vel: 2.54 cm2
AV Mean grad: 4 mmHg
AV Peak grad: 6.2 mmHg
Ao pk vel: 1.24 m/s
Area-P 1/2: 3.77 cm2
Height: 70 in
S' Lateral: 3 cm
Weight: 2730.18 oz

## 2021-03-14 LAB — CULTURE, BLOOD (ROUTINE X 2)

## 2021-03-14 MED ORDER — GABAPENTIN 300 MG PO CAPS
600.0000 mg | ORAL_CAPSULE | Freq: Once | ORAL | Status: AC
  Administered 2021-03-14: 22:00:00 600 mg via ORAL
  Filled 2021-03-14: qty 2

## 2021-03-14 MED ORDER — LINEZOLID 600 MG PO TABS
600.0000 mg | ORAL_TABLET | Freq: Two times a day (BID) | ORAL | Status: DC
  Administered 2021-03-14 – 2021-03-15 (×3): 600 mg via ORAL
  Filled 2021-03-14 (×3): qty 1

## 2021-03-14 MED ORDER — POTASSIUM CHLORIDE CRYS ER 20 MEQ PO TBCR
40.0000 meq | EXTENDED_RELEASE_TABLET | Freq: Once | ORAL | Status: AC
  Administered 2021-03-14: 10:00:00 40 meq via ORAL
  Filled 2021-03-14: qty 2

## 2021-03-14 MED ORDER — TRAMADOL HCL 50 MG PO TABS
100.0000 mg | ORAL_TABLET | Freq: Once | ORAL | Status: AC
  Administered 2021-03-14: 22:00:00 100 mg via ORAL
  Filled 2021-03-14: qty 2

## 2021-03-14 NOTE — Progress Notes (Signed)
PROGRESS NOTE    Timothy Khan  GUY:[id number] DOB: [birth year] DOA: 03/12/2021 PCP: [name], MD    Chief Complaint  Patient presents with   Vomiting   Fever    Brief Narrative:  Timothy Khan was admitted to the hospital with the working diagnosis of acute pancreatitis.   87 yo male with the past medical history with the past medical history of BPH, CKD stage 3 and HTN who presented with fever, abdominal pain and altered mental status. Patient developed post pandrial abdominal pain, associated with nausea and vomiting, posteriorly developed fever and was noted to be altered and ill looking appearing. EMS was called and patient was brought to the hospital, on his initial physical examination he was febrile 102.8, HR 109, oxygen saturation 94%, and RR 14 to 18. His lungs were clear to auscultation, heart with S1 and S2 present, abdomen was distended but not tender, no lower extremity edema.   NA 137, K 3,8, CL 105, bicarb 24, glucose 137, BUN 24, Cr 1,28.  Alkaline phosphatase 154, lipase 591, AST 516, ALT 430, total bilirubin is 2.5.  Lactic acid 2.8. White count 22.8, hemoglobin 14.5, hematocrit 43.9, platelets 283. SARS COVID-19 negative.   Urine analysis specific gravity 1.015, negative nitrates, negative leukocytes.   Head CT with subcortical hypodensity in the frontal lobe on the left anteriorly possible infarct of indeterminate age.  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   CT of the abdomen Findings compatible with pancreatitis/duodenitis.  Status postcholecystectomy with fat stranding in the gallbladder fossa. No biliary duct dilatation. Pancreas with fat stranding about the pancreatic head.  No pancreatic duct dilatation.   Chest radiograph no infiltrates.   EKG with 104 bpm, normal axis, normal intervals, sinus rhythm, no significant ST segment or T  wave changes.    MRCP with potential small stone within the common bile duct approximately 1 cm from the ampulla. GI, Dr Michail Sermon was consulted.  Liver enzymes continue to improve, along with symptoms.     Assessment & Plan:   Principal Problem:   Acute pancreatitis Active Problems:   Hypertension   BPH (benign prostatic hyperplasia)   GERD (gastroesophageal reflux disease)   Chronic kidney disease, stage III (moderate) (HCC)   Elevated LFTs -cannot rule out retained/de novo biliary stone   Sepsis with acute organ dysfunction without septic shock ([place])   #1 acute pancreatitis/gallstone pancreatitis -Patient recent cholecystectomy 01/28/2021, JP drain removed on 02/08/2021. -Patient presented with abdominal pain, nausea and vomiting. -Patient noted with elevated LFTs on presentation. -CT abdomen and pelvis done consistent with pancreatitis/duodenitis, status postcholecystectomy with fat stranding in the gallbladder fossa unchanged, small hiatal hernia, prominent appendix at 8 mm in diameter unchanged. -MRCP done with potential small stone within the common bile duct approximately 1 cm from the ampulla, no intra or extra hepatic biliary duct dilatation, postcholecystectomy, minimal pancreatic inflammation with small amount of fluid no longer anterior right pararenal space.  No organized fluid collections.  No pancreatic duct dilatation. -Patient with clinical improvement. -LFTs trending down. -Diet advanced to a soft diet which he tolerated. -Patient with noted bacteremia with concerns for ascending cholangitis and placed empirically on IV Zosyn and Zyvox added per ID. -Blood cultures with Enterococcus. -DC IV Zosyn. -Continue Zyvox as recommended by ID. -Patient being followed by GI who feel no further ERCP needed at this time and recommending outpatient CT scan in 2  to 3 weeks to follow on LFTs.  2.  Enterococcus bacteremia -Likely source from possible cholangitis. -Repeat blood  cultures ordered this morning and pending. -2D echo with no signs of endocarditis. -Discontinue IV Zosyn. -Continue Zyvox as recommended by ID and treat for total of 2 weeks per ID recommendations. -Follow-up.  3.  CKD stage IIIa -Stable.  4.  GERD -PPI.  5.  BPH -Continue home regimen Enablex and tamsulosin.  6.  Hypertension -Norvasc.   DVT prophylaxis: SCDs Code Status: Full Family Communication: Updated patient, daughter at bedside. Disposition:   Status is: Inpatient  Remains inpatient appropriate because: Severity of illness       Consultants:  ID: Dr. Baxter Flattery 03/13/2021 Gastroenterology: Dr. Michail Sermon 03/14/2021  Procedures:  CT head 03/12/2021 \\2D  echo 03/14/2021 CT abdomen and pelvis 03/12/2021 Chest x-ray 03/12/2021 MRCP 03/12/2021  Antimicrobials:  Zyvox 03/14/2021>>>>> IV cefepime 03/12/2021 x 1 dose IV Flagyl 03/12/2021 x 1 dose IV Zosyn 03/12/2021 >>>>> 03/14/2021 IV vancomycin 03/12/2021   Subjective: Patient sitting up in bed.  Denies any chest pain, no shortness of breath, no abdominal pain.  No nausea or vomiting.  Tolerated soft diet.  States he is ready to go home.  Daughter at bedside.  Objective: Vitals:   03/13/21 1432 03/13/21 2211 03/14/21 0500 03/14/21 0535  BP: 116/61 116/62  115/69  Pulse: 75 77  76  Resp: 18 18  20   Temp: 98.9 F (37.2 C) 97.8 F (36.6 C)  97.6 F (36.4 C)  TempSrc: Oral Oral  Oral  SpO2: 94% 95%  94%  Weight:   77.4 kg   Height:        Intake/Output Summary (Last 24 hours) at 03/14/2021 1856 Last data filed at 03/14/2021 1400 Gross per 24 hour  Intake 910.99 ml  Output 450 ml  Net 460.99 ml   Filed Weights   03/12/21 1856 03/13/21 0500 03/14/21 0500  Weight: 73.8 kg 79 kg 77.4 kg    Examination:  General exam: Appears calm and comfortable  Respiratory system: Clear to auscultation. Respiratory effort normal. Cardiovascular system: S1 & S2 heard, RRR. No JVD, murmurs, rubs, gallops or  clicks. No pedal edema. Gastrointestinal system: Abdomen is nondistended, soft and nontender. No organomegaly or masses felt. Normal bowel sounds heard. Central nervous system: Alert and oriented. No focal neurological deficits. Extremities: Symmetric 5 x 5 power. Skin: No rashes, lesions or ulcers Psychiatry: Judgement and insight appear normal. Mood & affect appropriate.     Data Reviewed: I have personally reviewed following labs and imaging studies  CBC: Recent Labs  Lab 03/12/21 0041 03/13/21 0552  WBC 22.8* 15.1*  NEUTROABS 21.0* 12.1*  HGB 14.5 12.7*  HCT 43.9 38.2*  MCV 87.3 87.6  PLT 283 474    Basic Metabolic Panel: Recent Labs  Lab 03/12/21 0041 03/13/21 0552 03/14/21 0512  NA 137 137 136  K 3.8 3.6 3.5  CL 105 102 102  CO2 24 29 28   GLUCOSE 137* 96 107*  BUN 24* 16 19  CREATININE 1.28* 1.18 1.37*  CALCIUM 9.1 8.2* 8.3*  MG  --  1.9  --     GFR: Estimated Creatinine Clearance: 39.2 mL/min (A) (by C-G formula based on SCr of 1.37 mg/dL (H)).  Liver Function Tests: Recent Labs  Lab 03/12/21 0041 03/13/21 0552 03/14/21 0512  AST 516* 147* 63*  ALT 430* 259* 172*  ALKPHOS 154* 116 114  BILITOT 2.5* 2.3* 1.2  PROT 8.7* 6.6 6.6  ALBUMIN 4.7 3.2* 3.1*  CBG: No results for input(s): GLUCAP in the last 168 hours.   Recent Results (from the past 240 hour(s))  Blood Culture (routine x 2)     Status: Abnormal   Collection Time: 03/12/21 12:41 AM   Specimen: BLOOD  Result Value Ref Range Status   Specimen Description   Final    BLOOD RIGHT ANTECUBITAL Performed at [facility] [address] [place], [place] [address]    Special Requests   Final    BOTTLES DRAWN AEROBIC AND ANAEROBIC Blood Culture results may not be optimal due to an excessive volume of blood received in culture bottles Performed at [facility] [address] [place], [place] [address]    Culture  Setup Time   Final    GRAM POSITIVE  COCCI IN PAIRS AND CHAINS IN BOTH AEROBIC AND ANAEROBIC BOTTLES CRITICAL RESULT CALLED TO, READ BACK BY AND VERIFIED WITH: L POINDEXTER,[email or web address]@[id number]  03/12/21 [place] Performed at [facility], [place] [address], [place], [place] [address]    Culture ENTEROCOCCUS FAECIUM (A)  Final   Report Status 03/14/2021 FINAL  Final   Organism ID, Bacteria ENTEROCOCCUS FAECIUM  Final      Susceptibility   Enterococcus faecium - MIC*    AMPICILLIN RESISTANT Resistant     VANCOMYCIN <=0.5 SENSITIVE Sensitive     GENTAMICIN SYNERGY SENSITIVE Sensitive     * ENTEROCOCCUS FAECIUM  Urine Culture     Status: None   Collection Time: 03/12/21 12:41 AM   Specimen: In/Out Cath Urine  Result Value Ref Range Status   Specimen Description   Final    IN/OUT CATH URINE Performed at [facility], [place] [address] [place], [place] [address]    Special Requests   Final    NONE Performed at [facility], [place] [address] [place], [place] [address]    Culture   Final    NO GROWTH Performed at [facility], [place] [address] [place], [place] [address]    Report Status 03/13/2021 FINAL  Final  Resp Panel by RT-PCR (Flu A&B, Covid) Nasopharyngeal Swab     Status: None   Collection Time: 03/12/21 12:41 AM   Specimen: Nasopharyngeal Swab; Nasopharyngeal(NP) swabs in vial transport medium  Result Value Ref Range Status   SARS Coronavirus 2 by RT PCR NEGATIVE NEGATIVE Final    Comment: (NOTE) SARS-CoV-2 target nucleic acids are NOT DETECTED.  The SARS-CoV-2 RNA is generally detectable in upper respiratory specimens during the acute phase of infection. The lowest concentration of SARS-CoV-2 viral copies this assay can detect is 138 copies/mL. A negative result does not preclude SARS-Cov-2 infection and should not be used as the sole basis for treatment or other patient management decisions. A negative result may occur with  improper specimen collection/handling, submission of  specimen other than nasopharyngeal swab, presence of viral mutation(s) within the areas targeted by this assay, and inadequate number of viral copies(<138 copies/mL). A negative result must be combined with clinical observations, patient history, and epidemiological information. The expected result is Negative.  Fact Sheet for Patients:  EntrepreneurPulse.com.au  Fact Sheet for Healthcare Providers:  IncredibleEmployment.be  This test is no t yet approved or cleared by the [place] FDA and  has been authorized for detection and/or diagnosis of SARS-CoV-2 by FDA under an Emergency Use Authorization (EUA). This EUA will remain  in effect (meaning this test can be used) for the duration of the COVID-19 declaration under Section  564(b)(1) of the Act, 21 U.S.C.section 360bbb-3(b)(1), unless the authorization is terminated  or revoked sooner.       Influenza A by PCR NEGATIVE NEGATIVE Final   Influenza B by PCR NEGATIVE NEGATIVE Final    Comment: (NOTE) The Xpert Xpress SARS-CoV-2/FLU/RSV plus assay is intended as an aid in the diagnosis of influenza from Nasopharyngeal swab specimens and should not be used as a sole basis for treatment. Nasal washings and aspirates are unacceptable for Xpert Xpress SARS-CoV-2/FLU/RSV testing.  Fact Sheet for Patients: EntrepreneurPulse.com.au  Fact Sheet for Healthcare Providers: IncredibleEmployment.be  This test is not yet approved or cleared by the [place] FDA and has been authorized for detection and/or diagnosis of SARS-CoV-2 by FDA under an Emergency Use Authorization (EUA). This EUA will remain in effect (meaning this test can be used) for the duration of the COVID-19 declaration under Section 564(b)(1) of the Act, 21 U.S.C. section 360bbb-3(b)(1), unless the authorization is terminated or revoked.  Performed at [facility], [place]  [address] [place], [place] [address]   Blood Culture ID Panel (Reflexed)     Status: Abnormal   Collection Time: 03/12/21 12:41 AM  Result Value Ref Range Status   Enterococcus faecalis NOT DETECTED NOT DETECTED Final   Enterococcus Faecium DETECTED (A) NOT DETECTED Final    Comment: CRITICAL RESULT CALLED TO, READ BACK BY AND VERIFIED WITH: L POINDEXTER,[email or web address]  03/12/21 [place]    Listeria monocytogenes NOT DETECTED NOT DETECTED Final   Staphylococcus species NOT DETECTED NOT DETECTED Final   Staphylococcus aureus (BCID) NOT DETECTED NOT DETECTED Final   Staphylococcus epidermidis NOT DETECTED NOT DETECTED Final   Staphylococcus lugdunensis NOT DETECTED NOT DETECTED Final   Streptococcus species NOT DETECTED NOT DETECTED Final   Streptococcus agalactiae NOT DETECTED NOT DETECTED Final   Streptococcus pneumoniae NOT DETECTED NOT DETECTED Final   Streptococcus pyogenes NOT DETECTED NOT DETECTED Final   A.calcoaceticus-baumannii NOT DETECTED NOT DETECTED Final   Bacteroides fragilis NOT DETECTED NOT DETECTED Final   Enterobacterales NOT DETECTED NOT DETECTED Final   Enterobacter cloacae complex NOT DETECTED NOT DETECTED Final   Escherichia coli NOT DETECTED NOT DETECTED Final   Klebsiella aerogenes NOT DETECTED NOT DETECTED Final   Klebsiella oxytoca NOT DETECTED NOT DETECTED Final   Klebsiella pneumoniae NOT DETECTED NOT DETECTED Final   Proteus species NOT DETECTED NOT DETECTED Final   Salmonella species NOT DETECTED NOT DETECTED Final   Serratia marcescens NOT DETECTED NOT DETECTED Final   Haemophilus influenzae NOT DETECTED NOT DETECTED Final   Neisseria meningitidis NOT DETECTED NOT DETECTED Final   Pseudomonas aeruginosa NOT DETECTED NOT DETECTED Final   Stenotrophomonas maltophilia NOT DETECTED NOT DETECTED Final   Candida albicans NOT DETECTED NOT DETECTED Final   Candida auris NOT DETECTED NOT DETECTED Final   Candida glabrata NOT DETECTED NOT DETECTED Final   Candida  krusei NOT DETECTED NOT DETECTED Final   Candida parapsilosis NOT DETECTED NOT DETECTED Final   Candida tropicalis NOT DETECTED NOT DETECTED Final   Cryptococcus neoformans/gattii NOT DETECTED NOT DETECTED Final   Vancomycin resistance NOT DETECTED NOT DETECTED Final    Comment: Performed at [facility], [address] [place], [place] [address]  Blood Culture (routine x 2)     Status: None (Preliminary result)   Collection Time: 03/12/21 12:46 AM   Specimen: BLOOD  Result Value Ref Range Status   Specimen Description   Final    BLOOD RIGHT WRIST Performed at [facility],  [place] [address] [place], [place] [address]    Special Requests   Final    BOTTLES DRAWN AEROBIC AND ANAEROBIC Blood Culture adequate volume Performed at [facility] [address] [place], [place] [address]    Culture   Final    NO GROWTH 2 DAYS Performed at [facility] [address] [place], [place] [address]    Report Status PENDING  Incomplete         Radiology Studies: ECHOCARDIOGRAM COMPLETE  Result Date: 03/14/2021    ECHOCARDIOGRAM REPORT   Patient Name:   HANCE CASPERS Date of Exam: 03/14/2021 Medical Rec #:  [id number]            Height:       70.0 in Accession #:    [id number]           Weight:       170.6 lb Date of Birth:  Apr 07, 1931           BSA:          1.951 m Patient Age:    86 years             BP:           115/69 mmHg Patient Gender: M                    HR:           71 bpm. Exam Location:  Inpatient Procedure: 2D Echo, Cardiac Doppler and Color Doppler Indications:    Bacteremia  History:        Patient has prior history of Echocardiogram examinations, most                 recent 01/26/2021. Arrythmias:LBBB; Risk Factors:Hypertension.  Sonographer:    Merrie Roof RDCS Referring Phys: [place]  1. Left ventricular ejection fraction, by estimation, is 60 to 65%. The left ventricle has normal function. The  left ventricle has no regional wall motion abnormalities. Left ventricular diastolic parameters are consistent with Grade I diastolic dysfunction (impaired relaxation).  2. Right ventricular systolic function is normal. The right ventricular size is normal.  3. The mitral valve is normal in structure. No evidence of mitral valve regurgitation. No evidence of mitral stenosis.  4. The aortic valve is tricuspid. There is mild thickening of the aortic valve. Aortic valve regurgitation is not visualized. Aortic valve sclerosis is present, with no evidence of aortic valve stenosis.  5. The inferior vena cava is normal in size with greater than 50% respiratory variability, suggesting right atrial pressure of 3 mmHg. Conclusion(s)/Recommendation(s): No evidence of valvular vegetations on this transthoracic echocardiogram. Consider a transesophageal echocardiogram to exclude infective endocarditis if clinically indicated. FINDINGS  Left Ventricle: Left ventricular ejection fraction, by estimation, is 60 to 65%. The left ventricle has normal function. The left ventricle has no regional wall motion abnormalities. The left ventricular internal cavity size was normal in size. There is  no left ventricular hypertrophy. Left ventricular diastolic parameters are consistent with Grade I diastolic dysfunction (impaired relaxation). Normal left ventricular filling pressure. Right Ventricle: The right ventricular size is normal. No increase in right ventricular wall thickness. Right ventricular systolic function is normal. Left Atrium: Left atrial size was normal in size. Right Atrium: Right atrial size was normal in size. Pericardium: There is no evidence of pericardial effusion. Mitral Valve: The mitral valve is normal in structure. No evidence of mitral valve  regurgitation. No evidence of mitral valve stenosis. Tricuspid Valve: The tricuspid valve is normal in structure. Tricuspid valve regurgitation is not demonstrated. No evidence  of tricuspid stenosis. Aortic Valve: The aortic valve is tricuspid. There is mild thickening of the aortic valve. Aortic valve regurgitation is not visualized. Aortic valve sclerosis is present, with no evidence of aortic valve stenosis. Aortic valve mean gradient measures 4.0  mmHg. Aortic valve peak gradient measures 6.2 mmHg. Aortic valve area, by VTI measures 3.08 cm. Pulmonic Valve: The pulmonic valve was normal in structure. Pulmonic valve regurgitation is not visualized. No evidence of pulmonic stenosis. Aorta: The aortic root is normal in size and structure. Venous: The inferior vena cava is normal in size with greater than 50% respiratory variability, suggesting right atrial pressure of 3 mmHg. IAS/Shunts: No atrial level shunt detected by color flow Doppler.  LEFT VENTRICLE PLAX 2D LVIDd:         4.60 cm   Diastology LVIDs:         3.00 cm   LV e' medial:    7.51 cm/s LV PW:         1.10 cm   LV E/e' medial:  10.3 LV IVS:        1.20 cm   LV e' lateral:   10.10 cm/s LVOT diam:     2.00 cm   LV E/e' lateral: 7.6 LV SV:         84 LV SV Index:   43 LVOT Area:     3.14 cm  RIGHT VENTRICLE RV Basal diam:  3.40 cm LEFT ATRIUM           Index        RIGHT ATRIUM           Index LA diam:      3.50 cm 1.79 cm/m   RA Area:     17.10 cm LA Vol (A2C): 71.4 ml 36.59 ml/m  RA Volume:   38.20 ml  19.58 ml/m LA Vol (A4C): 54.1 ml 27.73 ml/m  AORTIC VALVE AV Area (Vmax):    3.17 cm AV Area (Vmean):   2.54 cm AV Area (VTI):     3.08 cm AV Vmax:           124.00 cm/s AV Vmean:          97.700 cm/s AV VTI:            0.271 m AV Peak Grad:      6.2 mmHg AV Mean Grad:      4.0 mmHg LVOT Vmax:         125.00 cm/s LVOT Vmean:        79.100 cm/s LVOT VTI:          0.266 m LVOT/AV VTI ratio: 0.98  AORTA Ao Root diam: 3.70 cm Ao Asc diam:  3.20 cm MITRAL VALVE MV Area (PHT): 3.77 cm     SHUNTS MV Decel Time: 201 msec     Systemic VTI:  0.27 m MV E velocity: 77.10 cm/s   Systemic Diam: 2.00 cm MV A velocity: 112.00 cm/s  MV E/A ratio:  0.69 Mihai Croitoru MD Electronically signed by Sanda Klein MD Signature Date/Time: 03/14/2021/3:05:25 PM    Final         Scheduled Meds:  amLODipine  5 mg Oral QHS   darifenacin  7.5 mg Oral Daily   linezolid  600 mg Oral Q12H   pantoprazole  40 mg Oral Daily  tamsulosin  0.4 mg Oral QHS   Continuous Infusions:  piperacillin-tazobactam (ZOSYN)  IV 3.375 g (03/14/21 1745)     LOS: 2 days    Time spent: 40 minutes    Irine Seal, MD [facility]   To contact the attending provider between 7A-7P or the covering provider during after hours 7P-7A, please log into the web site [email or web address] and access using universal [place] password for that web site. If you do not have the password, please call the hospital operator.  03/14/2021, 6:56 PM

## 2021-03-14 NOTE — Progress Notes (Signed)
[name]    Date of Admission:  03/12/2021   Total days of antibiotics 4        Day 1 linezolid   ID: [name] is a 87 y.o. male with  cholangitis with secondary e.faecium bacteremia Principal Problem:   Acute pancreatitis Active Problems:   Hypertension   BPH (benign prostatic hyperplasia)   GERD (gastroesophageal reflux disease)   Chronic kidney disease, stage III (moderate) (HCC)   Elevated LFTs -cannot rule out retained/de novo biliary stone   Sepsis with acute organ dysfunction without septic shock (HCC)    Subjective: Afebrile, denies any abdominal pain with eating. Has not had BM. Had TTE-did not suggest any vegetations  Labs continue to show improvement/transaminitis trending downward  Medications:   amLODipine  5 mg Oral QHS   darifenacin  7.5 mg Oral Daily   linezolid  600 mg Oral Q12H   pantoprazole  40 mg Oral Daily   tamsulosin  0.4 mg Oral QHS    Objective: Vital signs in last 24 hours: Temp:  [97.6 F (36.4 C)-97.8 F (36.6 C)] 97.6 F (36.4 C) (12/26 0535) Pulse Rate:  [76-77] 76 (12/26 0535) Resp:  [18-20] 20 (12/26 0535) BP: (115-116)/(62-69) 115/69 (12/26 0535) SpO2:  [94 %-95 %] 94 % (12/26 0535) Weight:  [77.4 kg] 77.4 kg (12/26 0500) Physical Exam  Constitutional: He is oriented to person, place, and time. He appears well-developed and well-nourished. No distress.  HENT:  Mouth/Throat: Oropharynx is clear and moist. No oropharyngeal exudate.  Cardiovascular: Normal rate, regular rhythm and normal heart sounds. Exam reveals no gallop and no friction rub.  No murmur heard.  Pulmonary/Chest: Effort normal and breath sounds normal. No respiratory distress. He has no wheezes.  Abdominal: Soft. Bowel sounds are normal. He exhibits no distension. There is no tenderness.  Lymphadenopathy:  He has no cervical adenopathy.  Neurological: He is alert and oriented to person, place, and time.  Skin: Skin is  warm and dry. No rash noted. No erythema.  Psychiatric: He has a normal mood and affect. His behavior is normal.    Lab Results Recent Labs    03/12/21 0041 03/13/21 0552 03/14/21 0512  WBC 22.8* 15.1*  --   HGB 14.5 12.7*  --   HCT 43.9 38.2*  --   NA 137 137 136  K 3.8 3.6 3.5  CL 105 102 102  CO2 24 29 28   BUN 24* 16 19  CREATININE 1.28* 1.18 1.37*   Liver Panel Recent Labs    03/13/21 0552 03/14/21 0512  PROT 6.6 6.6  ALBUMIN 3.2* 3.1*  AST 147* 63*  ALT 259* 172*  ALKPHOS 116 114  BILITOT 2.3* 1.2   Sedimentation Rate No results for input(s): ESRSEDRATE in the last 72 hours. C-Reactive Protein No results for input(s): CRP in the last 72 hours.  Microbiology: 12/26 blood cx PENDING 12/24 blood cx 1 of 2 sets positive for e.faecium Studies/Results: ECHOCARDIOGRAM COMPLETE  Result Date: 03/14/2021    ECHOCARDIOGRAM REPORT   Patient Name:   Timothy Khan Date of Exam: 03/14/2021 Medical Rec #:  [id number]            Height:       70.0 in Accession #:    [id number]           Weight:       170.6 lb Date of Birth:  12-Jun-1931  BSA:          1.951 m Patient Age:    66 years             BP:           115/69 mmHg Patient Gender: M                    HR:           71 bpm. Exam Location:  Inpatient Procedure: 2D Echo, Cardiac Doppler and Color Doppler Indications:    Bacteremia  History:        Patient has prior history of Echocardiogram examinations, most                 recent 01/26/2021. Arrythmias:LBBB; Risk Factors:Hypertension.  Sonographer:    Merrie Roof RDCS Referring Phys: [place]  1. Left ventricular ejection fraction, by estimation, is 60 to 65%. The left ventricle has normal function. The left ventricle has no regional wall motion abnormalities. Left ventricular diastolic parameters are consistent with Grade I diastolic dysfunction (impaired relaxation).  2. Right ventricular systolic function is normal. The right ventricular  size is normal.  3. The mitral valve is normal in structure. No evidence of mitral valve regurgitation. No evidence of mitral stenosis.  4. The aortic valve is tricuspid. There is mild thickening of the aortic valve. Aortic valve regurgitation is not visualized. Aortic valve sclerosis is present, with no evidence of aortic valve stenosis.  5. The inferior vena cava is normal in size with greater than 50% respiratory variability, suggesting right atrial pressure of 3 mmHg. Conclusion(s)/Recommendation(s): No evidence of valvular vegetations on this transthoracic echocardiogram. Consider a transesophageal echocardiogram to exclude infective endocarditis if clinically indicated. FINDINGS  Left Ventricle: Left ventricular ejection fraction, by estimation, is 60 to 65%. The left ventricle has normal function. The left ventricle has no regional wall motion abnormalities. The left ventricular internal cavity size was normal in size. There is  no left ventricular hypertrophy. Left ventricular diastolic parameters are consistent with Grade I diastolic dysfunction (impaired relaxation). Normal left ventricular filling pressure. Right Ventricle: The right ventricular size is normal. No increase in right ventricular wall thickness. Right ventricular systolic function is normal. Left Atrium: Left atrial size was normal in size. Right Atrium: Right atrial size was normal in size. Pericardium: There is no evidence of pericardial effusion. Mitral Valve: The mitral valve is normal in structure. No evidence of mitral valve regurgitation. No evidence of mitral valve stenosis. Tricuspid Valve: The tricuspid valve is normal in structure. Tricuspid valve regurgitation is not demonstrated. No evidence of tricuspid stenosis. Aortic Valve: The aortic valve is tricuspid. There is mild thickening of the aortic valve. Aortic valve regurgitation is not visualized. Aortic valve sclerosis is present, with no evidence of aortic valve stenosis.  Aortic valve mean gradient measures 4.0  mmHg. Aortic valve peak gradient measures 6.2 mmHg. Aortic valve area, by VTI measures 3.08 cm. Pulmonic Valve: The pulmonic valve was normal in structure. Pulmonic valve regurgitation is not visualized. No evidence of pulmonic stenosis. Aorta: The aortic root is normal in size and structure. Venous: The inferior vena cava is normal in size with greater than 50% respiratory variability, suggesting right atrial pressure of 3 mmHg. IAS/Shunts: No atrial level shunt detected by color flow Doppler.  LEFT VENTRICLE PLAX 2D LVIDd:         4.60 cm   Diastology LVIDs:  3.00 cm   LV e' medial:    7.51 cm/s LV PW:         1.10 cm   LV E/e' medial:  10.3 LV IVS:        1.20 cm   LV e' lateral:   10.10 cm/s LVOT diam:     2.00 cm   LV E/e' lateral: 7.6 LV SV:         84 LV SV Index:   43 LVOT Area:     3.14 cm  RIGHT VENTRICLE RV Basal diam:  3.40 cm LEFT ATRIUM           Index        RIGHT ATRIUM           Index LA diam:      3.50 cm 1.79 cm/m   RA Area:     17.10 cm LA Vol (A2C): 71.4 ml 36.59 ml/m  RA Volume:   38.20 ml  19.58 ml/m LA Vol (A4C): 54.1 ml 27.73 ml/m  AORTIC VALVE AV Area (Vmax):    3.17 cm AV Area (Vmean):   2.54 cm AV Area (VTI):     3.08 cm AV Vmax:           124.00 cm/s AV Vmean:          97.700 cm/s AV VTI:            0.271 m AV Peak Grad:      6.2 mmHg AV Mean Grad:      4.0 mmHg LVOT Vmax:         125.00 cm/s LVOT Vmean:        79.100 cm/s LVOT VTI:          0.266 m LVOT/AV VTI ratio: 0.98  AORTA Ao Root diam: 3.70 cm Ao Asc diam:  3.20 cm MITRAL VALVE MV Area (PHT): 3.77 cm     SHUNTS MV Decel Time: 201 msec     Systemic VTI:  0.27 m MV E velocity: 77.10 cm/s   Systemic Diam: 2.00 cm MV A velocity: 112.00 cm/s MV E/A ratio:  0.69 Mihai Croitoru MD Electronically signed by Sanda Klein MD Signature Date/Time: 03/14/2021/3:05:25 PM    Final      Assessment/Plan: 87yo M with cholangitis and secondary enterococcal bacteremia, passing retained  stone. Transaminitis improving - repeat blood cx drawn this morning. Will follow up should they be abn. - plan to discharge on linezolid 600mg  PO BID x 14 days (will need to check his out of pocket cost) - patient is improving from acute event, have low suspicion for endocarditis.  Leukocytosis =improving. Recommend repeat cbc tomorrow.  Will sign off.  [facility] Pager: [phone]  03/14/2021, 4:08 PM

## 2021-03-14 NOTE — Plan of Care (Signed)
Discussed with patient in front of daughter plan of care, pain management and labs (blood cultures) with some teach back displayed.  Problem: Education: Goal: Knowledge of General Education information will improve Description: Including pain rating scale, medication(s)/side effects and non-pharmacologic comfort measures Outcome: Progressing   Problem: Health Behavior/Discharge Planning: Goal: Ability to manage health-related needs will improve Outcome: Progressing

## 2021-03-14 NOTE — Progress Notes (Signed)
°  Echocardiogram 2D Echocardiogram has been performed.  Merrie Roof F 03/14/2021, 2:58 PM

## 2021-03-14 NOTE — Consult Note (Addendum)
Referring Provider: [facility] Primary Care Physician:  [name], MD Primary Gastroenterologist:  Dr. Therisa Doyne   Reason for Consultation:  Acute Pancreatitis/Elevated LFTs  HPI: Timothy Khan is a 87 y.o. male with the past medical history of BPH, CKD stage 3 and HTN presents for evaluation of elevated LFTs.  Patient originally presented to ED with fever, abdominal pain, and altered mental status.  Patient also had associated nausea and vomiting. Patient states he ate some ginger candy and began having epigastric pain. When daughter arrived she noticed he was febrile and patient was brought to ED. CT of the abdomen showed pancreatitis/duodenitis. Of note, patient had cholecystectomy 01/28/2021, JP drain was removed on 02/08/2021.  Patient has elevated LFTs 03/12/21 of AST 516/ ALT 430/ Alk phos 154, T. Bili 2.3. MRCP 12/24 showed potential stone in CBD without biliary duct dilation. Also showed pancreatic inflammation with small amount of fluid, no organized collections. Patient states this is his 3rd bout of pancreatitis.  Patient states today he is feeling much improved. Denies abdominal pain, nausea, vomiting. States he uses NSAIDs rarely. Denies alcohol/tobacco use. Denies family history of colon cancer.   Last colonoscopy 2007 with Dr. Redmond School: diverticulosis, otherwise normal Has had multiple unrevealing EGDs in the past for dysphagia, not having dysphagia now. Tolerating diet well.  Past Medical History:  Diagnosis Date   Abnormal brain MRI    Right side   Basal cell carcinoma of back    BPH (benign prostatic hyperplasia)    Chronic cough    CKD (chronic kidney disease), stage III ([place])    Concussion 1950   Baseball injury   Diastolic dysfunction    ED (erectile dysfunction)    Foot drop, bilateral 05/02/2013   Gait disorder    Gait disturbance    Gastroesophageal reflux disease    Hyperlipidemia    Hypertension    Libido, decreased    Lumbago    Memory loss     Onychomycosis    OSA (obstructive sleep apnea)    Peripheral neuropathy    PSA elevation    SDH (subdural hematoma) 04/30/2013   Skull fracture ([place])    Skull fracture ([place])    Sleep apnea     Past Surgical History:  Procedure Laterality Date   BIOPSY  03/01/2020   Procedure: BIOPSY;  Surgeon: Ronnette Juniper, MD;  Location: [facility];  Service: Gastroenterology;;   CATARACT EXTRACTION Bilateral    ESOPHAGEAL MANOMETRY N/A 01/05/2020   Procedure: ESOPHAGEAL MANOMETRY (EM);  Surgeon: Ronnette Juniper, MD;  Location: [facility];  Service: Gastroenterology;  Laterality: N/A;   ESOPHAGOGASTRODUODENOSCOPY (EGD) WITH PROPOFOL N/A 06/11/2018   Procedure: ESOPHAGOGASTRODUODENOSCOPY (EGD) WITH PROPOFOL;  Surgeon: Clarene Essex, MD;  Location: [facility];  Service: Endoscopy;  Laterality: N/A;   ESOPHAGOGASTRODUODENOSCOPY (EGD) WITH PROPOFOL N/A 03/01/2020   Procedure: ESOPHAGOGASTRODUODENOSCOPY (EGD) WITH PROPOFOL With Botox injections;  Surgeon: Ronnette Juniper, MD;  Location: [facility];  Service: Gastroenterology;  Laterality: N/A;   KNEE SURGERY Left    LAPAROSCOPIC CHOLECYSTECTOMY SINGLE SITE WITH INTRAOPERATIVE CHOLANGIOGRAM N/A 01/28/2021   Procedure: Laparscopic Cholecystectomy;  Drainage of empyema of gallbladder; Oversew of cystic duct with omentopexy;  Surgeon: Michael Boston, MD;  Location: [facility];  Service: General;  Laterality: N/A;   skin cancer resection     basal cell, [place] carcinoma   SUBMUCOSAL INJECTION  03/01/2020   Procedure: SUBMUCOSAL INJECTION;  Surgeon: Ronnette Juniper, MD;  Location: [facility];  Service: Gastroenterology;;   TONSILLECTOMY     VASECTOMY  Prior to Admission medications   Medication Sig Start Date End Date Taking? Authorizing Provider  amLODipine (NORVASC) 5 MG tablet Take 5 mg by mouth at bedtime. 06/24/18  Yes [provider]  cetirizine (ZYRTEC) 10 MG tablet Take 10 mg by mouth at bedtime.    Yes [provider]  Cholecalciferol (VITAMIN  D3) 2000 UNITS capsule Take 2,000 Units by mouth daily.   Yes [provider]  Coenzyme Q10 (CO Q-10) 300 MG CAPS Take 300 mg by mouth daily.   Yes [provider]  Cyanocobalamin (B-12) 5000 MCG CAPS Take 5,000 mcg by mouth daily.   Yes [provider]  esomeprazole (NEXIUM) 20 MG capsule Take 20 mg by mouth daily.    Yes [provider]  fluocinonide (LIDEX) 0.05 % external solution Apply 1 application topically daily. 20 drops or 1 ml on the scalp 02/13/20  Yes [provider]  fluticasone (FLONASE) 50 MCG/ACT nasal spray Place 2 sprays into both nostrils at bedtime.  04/16/13  Yes [provider]  gabapentin (NEURONTIN) 300 MG capsule Take 2 capsules (600 mg total) by mouth at bedtime. 12/04/17  Yes Kathrynn Ducking, MD  ondansetron (ZOFRAN) 4 MG tablet Take 4 mg by mouth 3 (three) times daily as needed for nausea or vomiting. 01/05/20  Yes [provider]  saccharomyces boulardii (FLORASTOR) 250 MG capsule Take 250 mg by mouth 2 (two) times daily.   Yes [provider]  senna-docusate (SENOKOT-S) 8.6-50 MG tablet Take 1 tablet by mouth daily as needed for mild constipation.   Yes [provider]  solifenacin (VESICARE) 5 MG tablet Take 5 mg by mouth every morning. 03/02/21  Yes [provider]  tamsulosin (FLOMAX) 0.4 MG CAPS Take 0.4 mg by mouth at bedtime.    Yes [provider]  traMADol (ULTRAM) 50 MG tablet Take 2 tablets (100 mg total) by mouth daily. Patient taking differently: Take 100 mg by mouth every evening. 06/15/18  Yes Purohit, Shrey C, MD  triamcinolone lotion (KENALOG) 0.1 % Apply 1 application topically daily. 02/04/21  Yes [provider]    Scheduled Meds:  amLODipine  5 mg Oral QHS   darifenacin  7.5 mg Oral Daily   pantoprazole  40 mg Oral Daily   tamsulosin  0.4 mg Oral QHS   Continuous Infusions:  piperacillin-tazobactam (ZOSYN)  IV 12.5 mL/hr at 03/14/21 0421    PRN Meds:.acetaminophen **OR** acetaminophen, gadobutrol, ondansetron **OR** ondansetron (ZOFRAN) IV  Allergies as of 03/12/2021 - Review Complete 03/12/2021  Allergen Reaction Noted   Doxazosin mesylate  07/22/2020   Lisinopril  07/22/2020   Macrolides and ketolides  07/22/2020   Codeine Nausea Only 03/18/2012    Family History  Problem Relation Age of Onset   Cancer Mother        Breast cancer   Heart attack Father 71   CAD Father    Heart disease Brother    Lupus Daughter     Social History   Socioeconomic History   Marital status: Married    Spouse name: Not on file   Number of children: 4   Years of education: 18   Highest education level: Not on file  Occupational History   Occupation: Retired  Tobacco Use   Smoking status: Former    Types: Cigarettes    Quit date: 08/18/1984    Years since quitting: 36.5   Smokeless tobacco: Never  Vaping Use   Vaping Use: Never used  Substance and  Sexual Activity   Alcohol use: No   Drug use: No   Sexual activity: Not on file  Other Topics Concern   Not on file  Social History Narrative   Patient states he is ambidextrous but is predominantly left handed.   Patient drinks 3 cups of coffee per day.   Social Determinants of Health   Financial Resource Strain: Not on file  Food Insecurity: Not on file  Transportation Needs: Not on file  Physical Activity: Not on file  Stress: Not on file  Social Connections: Not on file  Intimate Partner Violence: Not on file    Review of Systems: Review of Systems  Constitutional:  Negative for chills and fever.  HENT:  Negative for hearing loss and tinnitus.   Eyes:  Negative for blurred vision and double vision.  Respiratory:  Negative for cough and hemoptysis.   Cardiovascular:  Negative for chest pain and palpitations.  Gastrointestinal:  Negative for abdominal pain, blood in stool, constipation, diarrhea, heartburn, melena, nausea and vomiting.  Genitourinary:  Negative  for dysuria and urgency.  Musculoskeletal:  Negative for myalgias and neck pain.  Skin:  Negative for itching and rash.  Neurological:  Negative for seizures and loss of consciousness.  Psychiatric/Behavioral:  Negative for substance abuse. The patient is not nervous/anxious.     Physical Exam:Physical Exam Constitutional:      Appearance: Normal appearance.  HENT:     Head: Normocephalic and atraumatic.     Nose: Nose normal. No congestion.     Mouth/Throat:     Mouth: Mucous membranes are moist.     Pharynx: Oropharynx is clear.  Eyes:     Extraocular Movements: Extraocular movements intact.     Conjunctiva/sclera: Conjunctivae normal.  Cardiovascular:     Rate and Rhythm: Normal rate and regular rhythm.  Pulmonary:     Effort: Pulmonary effort is normal. No respiratory distress.  Abdominal:     General: Bowel sounds are normal. There is distension (mild).     Palpations: Abdomen is soft. There is no mass.     Tenderness: There is no abdominal tenderness. There is no guarding or rebound.     Hernia: No hernia is present.  Musculoskeletal:        General: No swelling. Normal range of motion.     Cervical back: Normal range of motion and neck supple.  Skin:    General: Skin is warm and dry.  Neurological:     General: No focal deficit present.     Mental Status: He is alert and oriented to person, place, and time.  Psychiatric:        Mood and Affect: Mood normal.        Behavior: Behavior normal.        Thought Content: Thought content normal.        Judgment: Judgment normal.    Vital signs: Vitals:   03/13/21 2211 03/14/21 0535  BP: 116/62 115/69  Pulse: 77 76  Resp: 18 20  Temp: 97.8 F (36.6 C) 97.6 F (36.4 C)  SpO2: 95% 94%   Last BM Date: 03/10/21    GI:  Lab Results: Recent Labs    03/12/21 0041 03/13/21 0552  WBC 22.8* 15.1*  HGB 14.5 12.7*  HCT 43.9 38.2*  PLT 283 205   BMET Recent Labs    03/12/21 0041 03/13/21 0552 03/14/21 0512   NA 137 137 136  K 3.8 3.6 3.5  CL 105 102 102  CO2  _0 GLUCOSE 137* 96 107*  BUN 24* 16 19  CREATININE 1.28* 1.18 1.37*  CALCIUM 9.1 8.2* 8.3*   LFT Recent Labs    03/14/21 0512  PROT 6.6  ALBUMIN 3.1*  AST 63*  ALT 172*  ALKPHOS 114  BILITOT 1.2   PT/INR Recent Labs    03/12/21 0041  LABPROT 13.1  INR 1.0     Studies/Results: MR 3D Recon At Scanner  Result Date: 03/12/2021 CLINICAL DATA:  Elevated. Rubin. Elevated lipase. Elevated white blood cell count. Findings concerning for acute pancreatitis. Evaluate for gallstone pancreatitis. EXAM: MRI ABDOMEN WITHOUT AND WITH CONTRAST (INCLUDING MRCP) TECHNIQUE: Multiplanar multisequence MR imaging of the abdomen was performed both before and after the administration of intravenous contrast. Heavily T2-weighted images of the biliary and pancreatic ducts were obtained, and three-dimensional MRCP images were rendered by post processing. CONTRAST:  104m GADAVIST GADOBUTROL 1 MMOL/ML IV SOLN COMPARISON:  03/12/2021 FINDINGS: MRCP images are degraded by respiratory motion Lower chest:  Lung bases are clear. Hepatobiliary: MRCP images are degraded by respiratory motion. The best series for evaluation of the extrahepatic bile ducts is the coronal haste imaging (series 5). On this T2 weighted series the common bile duct is normal to 5 mm (image 13/5). The common hepatic duct is normal caliber. Along the dorsal surface of the duct there is a small filling defect (image 14/5). This small defect is approximately 1.5 cm from the ampulla. Similar small potential defect is noted on the axial T2 weighted fat-sat series 6 on image 27. Similar small defect noted on the trueFISP imaging (image 21/series 10). Finally on the MRCP sequences (again these are degraded by respiratory motion collection there is suggestion of filling defect is similar level (image 18/series 11). This small filling defect measures approximately 3 mm. Patient status post  cholecystectomy. There is no intrahepatic biliary duct dilatation No enhancing lesion within liver. Multiple benign nonenhancing cysts. Pancreas: Pancreatic duct is normal caliber. Minimal pancreatic inflammation on MRI imaging. Small amount fluid along the anterior RIGHT pararenal space towards Morrison's pouch. Spleen: Normal spleen. Adrenals/urinary tract: Adrenal glands and kidneys are normal. Stomach/Bowel: Stomach and limited of the small bowel is unremarkable Vascular/Lymphatic: Abdominal aortic normal caliber. No retroperitoneal periportal lymphadenopathy. Musculoskeletal: No aggressive osseous lesion IMPRESSION: 1. Potential small stone within the common bile duct approximately 1 cm from the ampulla. No intra or extrahepatic biliary duct dilatation. 2. Postcholecystectomy. 3. Minimal pancreatic inflammation with small amount fluid along the anterior RIGHT perirenal space. No organized fluid collections. No pancreatic duct dilatation. 4. Multiple benign appearing hepatic cysts. Electronically Signed   By: SSuzy BouchardM.D.   On: 03/12/2021 14:18   MR ABDOMEN MRCP W WO CONTAST  Result Date: 03/12/2021 CLINICAL DATA:  Elevated. Rubin. Elevated lipase. Elevated white blood cell count. Findings concerning for acute pancreatitis. Evaluate for gallstone pancreatitis. EXAM: MRI ABDOMEN WITHOUT AND WITH CONTRAST (INCLUDING MRCP) TECHNIQUE: Multiplanar multisequence MR imaging of the abdomen was performed both before and after the administration of intravenous contrast. Heavily T2-weighted images of the biliary and pancreatic ducts were obtained, and three-dimensional MRCP images were rendered by post processing. CONTRAST:  89mGADAVIST GADOBUTROL 1 MMOL/ML IV SOLN COMPARISON:  03/12/2021 FINDINGS: MRCP images are degraded by respiratory motion Lower chest:  Lung bases are clear. Hepatobiliary: MRCP images are degraded by respiratory motion. The best series for evaluation of the extrahepatic bile ducts is  the coronal haste imaging (series 5). On this T2 weighted series the common bile  duct is normal to 5 mm (image 13/5). The common hepatic duct is normal caliber. Along the dorsal surface of the duct there is a small filling defect (image 14/5). This small defect is approximately 1.5 cm from the ampulla. Similar small potential defect is noted on the axial T2 weighted fat-sat series 6 on image 27. Similar small defect noted on the trueFISP imaging (image 21/series 10). Finally on the MRCP sequences (again these are degraded by respiratory motion collection there is suggestion of filling defect is similar level (image 18/series 11). This small filling defect measures approximately 3 mm. Patient status post cholecystectomy. There is no intrahepatic biliary duct dilatation No enhancing lesion within liver. Multiple benign nonenhancing cysts. Pancreas: Pancreatic duct is normal caliber. Minimal pancreatic inflammation on MRI imaging. Small amount fluid along the anterior RIGHT pararenal space towards Morrison's pouch. Spleen: Normal spleen. Adrenals/urinary tract: Adrenal glands and kidneys are normal. Stomach/Bowel: Stomach and limited of the small bowel is unremarkable Vascular/Lymphatic: Abdominal aortic normal caliber. No retroperitoneal periportal lymphadenopathy. Musculoskeletal: No aggressive osseous lesion IMPRESSION: 1. Potential small stone within the common bile duct approximately 1 cm from the ampulla. No intra or extrahepatic biliary duct dilatation. 2. Postcholecystectomy. 3. Minimal pancreatic inflammation with small amount fluid along the anterior RIGHT perirenal space. No organized fluid collections. No pancreatic duct dilatation. 4. Multiple benign appearing hepatic cysts. Electronically Signed   By: Suzy Bouchard M.D.   On: 03/12/2021 14:18    Impression: Elevated LFTs; possibly resolving choledocholthiasis - MRCP 12/24: showed potential stone in CBD without biliary duct dilation. pancreatic  inflammation with small amount of fluid, no organized collections. - AST 63 (147)/ ALT 172 (259)/ Alk phos 114/ T bili 1.2 (improving) - BUN 19, Cr 1.37 - Improving Leukocytosis WBC 15.1 (WBC 22.8 on 12/24)   Plan: MRCP showing possible choledocholithiasis with elevated LFTs that are improving With LFTs starting to return to normal, suspect patient had a bilary stone that has passed. Will follow LFTs to normalization. At this time, no indication for ERCP as LFTs are improving and patient is also clinically improving. Will likely need outpatient repeat imaging to evaluate progress of pancreatitis and reassess bilary duct. Continue supportive care [facility] will follow    LOS: 2 days   Miriam Kestler Radford Pax  PA-C 03/14/2021, 8:12 AM  Contact #  [phone]

## 2021-03-15 ENCOUNTER — Other Ambulatory Visit (HOSPITAL_COMMUNITY): Payer: Self-pay

## 2021-03-15 DIAGNOSIS — K859 Acute pancreatitis without necrosis or infection, unspecified: Secondary | ICD-10-CM | POA: Diagnosis not present

## 2021-03-15 DIAGNOSIS — N183 Chronic kidney disease, stage 3 unspecified: Secondary | ICD-10-CM | POA: Diagnosis not present

## 2021-03-15 DIAGNOSIS — K219 Gastro-esophageal reflux disease without esophagitis: Secondary | ICD-10-CM | POA: Diagnosis not present

## 2021-03-15 DIAGNOSIS — R7401 Elevation of levels of liver transaminase levels: Secondary | ICD-10-CM

## 2021-03-15 DIAGNOSIS — N400 Enlarged prostate without lower urinary tract symptoms: Secondary | ICD-10-CM | POA: Diagnosis not present

## 2021-03-15 LAB — CBC WITH DIFFERENTIAL/PLATELET
Abs Immature Granulocytes: 0.07 10*3/uL (ref 0.00–0.07)
Basophils Absolute: 0.1 10*3/uL (ref 0.0–0.1)
Basophils Relative: 1 %
Eosinophils Absolute: 0.5 10*3/uL (ref 0.0–0.5)
Eosinophils Relative: 5 %
HCT: 39 % (ref 39.0–52.0)
Hemoglobin: 12.8 g/dL — ABNORMAL LOW (ref 13.0–17.0)
Immature Granulocytes: 1 %
Lymphocytes Relative: 15 %
Lymphs Abs: 1.5 10*3/uL (ref 0.7–4.0)
MCH: 28.9 pg (ref 26.0–34.0)
MCHC: 32.8 g/dL (ref 30.0–36.0)
MCV: 88 fL (ref 80.0–100.0)
Monocytes Absolute: 0.9 10*3/uL (ref 0.1–1.0)
Monocytes Relative: 8 %
Neutro Abs: 7.4 10*3/uL (ref 1.7–7.7)
Neutrophils Relative %: 70 %
Platelets: 223 10*3/uL (ref 150–400)
RBC: 4.43 MIL/uL (ref 4.22–5.81)
RDW: 13.3 % (ref 11.5–15.5)
WBC: 10.4 10*3/uL (ref 4.0–10.5)
nRBC: 0 % (ref 0.0–0.2)

## 2021-03-15 LAB — COMPREHENSIVE METABOLIC PANEL
ALT: 113 U/L — ABNORMAL HIGH (ref 0–44)
AST: 32 U/L (ref 15–41)
Albumin: 3 g/dL — ABNORMAL LOW (ref 3.5–5.0)
Alkaline Phosphatase: 121 U/L (ref 38–126)
Anion gap: 7 (ref 5–15)
BUN: 15 mg/dL (ref 8–23)
CO2: 24 mmol/L (ref 22–32)
Calcium: 8 mg/dL — ABNORMAL LOW (ref 8.9–10.3)
Chloride: 105 mmol/L (ref 98–111)
Creatinine, Ser: 1.15 mg/dL (ref 0.61–1.24)
GFR, Estimated: >60 mL/min (ref >60)
Glucose, Bld: 136 mg/dL — ABNORMAL HIGH (ref 70–99)
Potassium: 3.7 mmol/L (ref 3.5–5.1)
Sodium: 136 mmol/L (ref 135–145)
Total Bilirubin: 0.8 mg/dL (ref 0.3–1.2)
Total Protein: 6.9 g/dL (ref 6.5–8.1)

## 2021-03-15 LAB — MAGNESIUM: Magnesium: 1.9 mg/dL (ref 1.7–2.4)

## 2021-03-15 MED ORDER — LINEZOLID 600 MG PO TABS: 600.0000 mg | ORAL_TABLET | Freq: Two times a day (BID) | ORAL | 0 refills | Status: AC

## 2021-03-15 MED ORDER — ACETAMINOPHEN 325 MG PO TABS
650.0000 mg | ORAL_TABLET | Freq: Four times a day (QID) | ORAL | Status: DC | PRN
Start: 2021-03-15 — End: 2021-07-04

## 2021-03-15 NOTE — TOC Transition Note (Signed)
Transition of Care [facility]) - CM/SW Discharge Note   Patient Details  Name: ROAN MIKLOS MRN: [id number] Date of Birth: Jun 10, 1931  Transition of Care [facility]) CM/SW Contact:  Leeroy Cha, RN Phone Number: 03/15/2021, 11:29 AM   Clinical Narrative:    Chart and orders checked for toc needs none found.   Final next level of care: Home/Self Care Barriers to Discharge: Continued Medical Work up   Patient Goals and CMS Choice Patient states their goals for this hospitalization and ongoing recovery are:: to go home CMS Medicare.gov Compare Post Acute Care list provided to:: Patient    Discharge Placement                       Discharge Plan and Services   Discharge Planning Services: CM Consult                                 Social Determinants of Health (SDOH) Interventions     Readmission Risk Interventions No flowsheet data found.

## 2021-03-15 NOTE — TOC Initial Note (Signed)
Transition of Care [facility]) - Initial/Assessment Note    Patient Details  Name: Timothy Khan MRN: [id number] Date of Birth: 07-Feb-1932  Transition of Care [facility]) CM/SW Contact:    Leeroy Cha, RN Phone Number: 03/15/2021, 8:39 AM  Clinical Narrative:                  Transition of Care [facility]) Screening Note   Patient Details  Name: Timothy Khan Date of Birth: June 12, 1931   Transition of Care [facility]) CM/SW Contact:    Leeroy Cha, RN Phone Number: 03/15/2021, 8:39 AM    Transition of Care Department [facility]) has reviewed patient and no TOC needs have been identified at this time. We will continue to monitor patient advancement through interdisciplinary progression rounds. If new patient transition needs arise, please place a TOC consult.    Expected Discharge Plan: Home/Self Care Barriers to Discharge: Continued Medical Work up   Patient Goals and CMS Choice Patient states their goals for this hospitalization and ongoing recovery are:: to go home CMS Medicare.gov Compare Post Acute Care list provided to:: Patient    Expected Discharge Plan and Services Expected Discharge Plan: Home/Self Care   Discharge Planning Services: CM Consult   Living arrangements for the past 2 months: Single Family Home                                      Prior Living Arrangements/Services Living arrangements for the past 2 months: Single Family Home Lives with:: Spouse Patient language and need for interpreter reviewed:: Yes Do you feel safe going back to the place where you live?: Yes            Criminal Activity/Legal Involvement Pertinent to Current Situation/Hospitalization: No - Comment as needed  Activities of Daily Living Home Assistive Devices/Equipment: Cane (specify quad or straight) ADL Screening (condition at time of admission) Patient's cognitive ability adequate to safely complete daily activities?: Yes Is the patient deaf or have  difficulty hearing?: No Does the patient have difficulty seeing, even when wearing glasses/contacts?: No Does the patient have difficulty concentrating, remembering, or making decisions?: No Patient able to express need for assistance with ADLs?: Yes Does the patient have difficulty dressing or bathing?: No Independently performs ADLs?: Yes (appropriate for developmental age) Does the patient have difficulty walking or climbing stairs?: Yes Weakness of Legs: Right Weakness of Arms/Hands: Right  Permission Sought/Granted                  Emotional Assessment Appearance:: Appears stated age Attitude/Demeanor/Rapport: Engaged Affect (typically observed): Calm Orientation: : Oriented to Place, Oriented to Self, Oriented to Situation, Oriented to  Time Alcohol / Substance Use: Not Applicable Psych Involvement: No (comment)  Admission diagnosis:  Acute pancreatitis [K85.90] Transaminitis [R74.01] Abdominal pain [R10.9] Acute pancreatitis, unspecified complication status, unspecified pancreatitis type [K85.90] Sepsis with encephalopathy without septic shock, due to unspecified organism ([place]) [A41.9, R65.20, G93.40] Patient Active Problem List   Diagnosis Date Noted   Elevated LFTs -cannot rule out retained/de novo biliary stone 03/12/2021   Sepsis with acute organ dysfunction without septic shock ([place]) 03/12/2021   Chronic kidney disease, stage III (moderate) ([place]) 03/01/2021   Former smoker 03/01/2021   Family history of early CAD 03/01/2021   OSA (obstructive sleep apnea) 07/25/2020   Acute pancreatitis 06/06/2018   BPH (benign prostatic hyperplasia) 06/06/2018   LBBB (left bundle branch block)  06/06/2018   GERD (gastroesophageal reflux disease) 06/06/2018   Hiatal hernia 06/06/2018   Foot drop, bilateral 05/02/2013   Polyneuropathy 04/30/2013   Hereditary and idiopathic peripheral neuropathy 03/18/2012   Abnormality of gait 03/18/2012   Hyperlipidemia 03/18/2012    Hypertension 03/18/2012   PCP:  [name], MD Pharmacy:   [facility] - [place], [place] - [address] [place] [address] Phone: [phone] Fax: [phone]  [place] [id number] - [address], [place] [address] [place] [address] [place] [address] Phone: [phone] Fax: [phone]     Social Determinants of Health ([place]) Interventions    Readmission Risk Interventions No flowsheet data found.

## 2021-03-15 NOTE — TOC Benefit Eligibility Note (Signed)
Patient Teacher, English as a foreign language completed.    The patient is currently admitted and upon discharge could be taking Linezolid 600 mg tablets.  The current 10 day co-pay is, $100.00.   The patient is insured through [place], [place] Patient Advocate Specialist [place] Patient Advocate Team Direct Number: [phone]  Fax: [phone]

## 2021-03-15 NOTE — Progress Notes (Signed)
Pt discharged home. AVS printed and educational teaching completed with teach back method. Ask me 3 utilized. PIV removed. Pt has all belongings no further questions at this time.

## 2021-03-15 NOTE — Discharge Summary (Signed)
Physician Discharge Summary  JAMEIR AKE YQI:[id number] DOB: 26-Mar-1931 DOA: 03/12/2021  PCP: [name], MD  Admit date: 03/12/2021 Discharge date: 03/15/2021  Time spent: 60 minutes  Recommendations for Outpatient Follow-up:  Follow-up with [name], MD in 2 weeks.  On follow-up patient will need a comprehensive metabolic profile done to follow-up on electrolytes, renal function, LFTs.  Patient will need a CBC done.  Repeat blood cultures pending at time of discharge will need to be followed up upon. Follow-up with Dr. Therisa Doyne, gastroenterology in 2 to 3 weeks.  On follow-up patient may benefit from repeat outpatient CT abdomen and pelvis.   Discharge Diagnoses:  Principal Problem:   Acute pancreatitis Active Problems:   Hypertension   BPH (benign prostatic hyperplasia)   GERD (gastroesophageal reflux disease)   Chronic kidney disease, stage III (moderate) (HCC)   Elevated LFTs -cannot rule out retained/de novo biliary stone   Sepsis with acute organ dysfunction without septic shock [facility])   Discharge Condition: Stable and improved.  Diet recommendation: Heart healthy  Filed Weights   03/13/21 0500 03/14/21 0500 03/15/21 0500  Weight: 79 kg 77.4 kg 77.7 kg    History of present illness:  HPI per Dr. Bridgett Larsson 87 year old male with a history of BPH, hypertension, CKD stage III who presents to the ER today with a 1 day history of nausea, vomiting, fever, altered mental status. Patient states that about 8:00 this morning he had a ginger candy that he was trying out.  He states that his daughter left it at his house last night for him to try.  He states that as soon as he ate it, he immediately began feeling nauseated and had severe midepigastric pain.  He soon vomited afterwards and started feeling better.  He went to lay down.  Around 1:00 in the afternoon, he developed a fever.  He had some more nausea and vomiting.  His daughter Cecille Rubin called him around 6 PM.  He had  been sleeping.  He seemed somewhat groggy on the phone.  When his daughter Cecille Rubin arrived into town from [place] around 11:00 PM, she noted that when she entered the room, the patient was having rigors.  He was confused and altered.  He was febrile.  She called 911 was brought to the hospital. On arrival, patient was febrile to 102.8.  Tachycardic to the 109.  Blood pressure 152/71.  Sats 94%.   Lab evaluation showed a white count of 22.8, hemoglobin 14.5, platelets 283   Initial lactic acid of 2.8, COVID test was negative.  Lipase elevated at 591   Chemistry sodium 137, potassium 3.8, bicarbonate 24   BUN of 24, creatinine 1.28   AST of 516, ALT of 430, alk phos 154, total bili 2.5   Urinalysis was essentially negative.   Chest x-ray with some left basilar atelectasis.  Some old rib fractures on the left-hand side are noted.   CT head demonstrated no acute intracranial hemorrhage.  There is a described subcortical hypodensity left frontal lobe that was not there on February 2015.  Possible age-indeterminate infarct.   CT abdomen demonstrated duodenitis and pancreatitis with fat stranding of the duodenum and the first and second portions.  Patient is status postcholecystectomy.   Due to acute pancreatitis, possible retained stone, elevated LFTs and sepsis with acute organ dysfunction, [facility] hospitalist contacted for admission.    ED Course: febrile to 102. WBC 22, lipase 500s, CT shows duodenitis/pancreatitis. Started on IV abx.   Hospital Course:  #1  acute pancreatitis/gallstone pancreatitis -Patient recent cholecystectomy 01/28/2021, JP drain removed on 02/08/2021. -Patient presented with abdominal pain, nausea and vomiting. -Patient noted with elevated LFTs on presentation. -CT abdomen and pelvis done consistent with pancreatitis/duodenitis, status postcholecystectomy with fat stranding in the gallbladder fossa unchanged, small hiatal hernia, prominent appendix at 8 mm in diameter  unchanged. -MRCP done with potential small stone within the common bile duct approximately 1 cm from the ampulla, no intra or extra hepatic biliary duct dilatation, postcholecystectomy, minimal pancreatic inflammation with small amount of fluid no longer anterior right pararenal space.  No organized fluid collections.  No pancreatic duct dilatation. -Patient improved clinically during the hospitalization . -LFTs trended down.   -Patient seen in consultation by GI and ID who followed the patient throughout the hospitalization.  -Diet advanced to a soft diet which he tolerated. -Patient with noted bacteremia with concerns for ascending cholangitis and placed empirically on IV Zosyn and Zyvox added per ID. -Blood cultures with Enterococcus. -2D echo was negative for endocarditis. -IV Zosyn discontinued. -Patient was discharged home on 13 days of Zyvox with outpatient follow-up with PCP and GI. -Patient was followed by GI who feel no further ERCP needed at this time and recommended outpatient CT scan in 2 to 3 weeks to follow on LFTs.  2.  Enterococcus bacteremia -Likely source from possible cholangitis. -Repeat blood cultures ordered with no growth to date by day of discharge.  Repeat blood cultures will be followed up per ID. -2D echo with no signs of endocarditis. -Patient seen in consultation by ID, initially was on IV Zosyn and Zyvox added to home regimen once sensitivities had resulted per ID recommendations.   -IV Zosyn subsequently discontinued.   -Patient be discharged home on 13 days of Zyvox to complete a 14-day course of treatment per ID recommendations.   -Outpatient follow-up with PCP.   3.  CKD stage IIIa -Stable.  4.  GERD -Patient maintained on PPI.  5.  BPH -Patient maintained on home regimen Enablex and tamsulosin.  6.  Hypertension -Was controlled on home regimen Norvasc.    Procedures: CT head 03/12/2021 \2D echo 03/14/2021 CT abdomen and pelvis  03/12/2021 Chest x-ray 03/12/2021 MRCP 03/12/2021  Consultations: ID: Dr. Baxter Flattery 03/13/2021 Gastroenterology: Dr. Michail Sermon 03/14/2021  Discharge Exam: Vitals:   03/14/21 2149 03/15/21 0503  BP: (!) 142/66 (!) 141/71  Pulse: 74 85  Resp: 20 20  Temp: 98.1 F (36.7 C) 98.4 F (36.9 C)  SpO2: 95% 94%    General: NAD Cardiovascular: Regular rate rhythm no murmurs rubs or gallops.  No JVD.  No lower extremity edema. Respiratory: Lungs clear to auscultation bilaterally.  No wheezes, no crackles, no rhonchi.  Normal respiratory effort.  Speaking in full sentences.  Discharge Instructions   Discharge Instructions     Diet - low sodium heart healthy   Complete by: As directed    Increase activity slowly   Complete by: As directed       Allergies as of 03/15/2021       Reactions   Doxazosin Mesylate    Other reaction(s): fatigue   Lisinopril    Other reaction(s): cough   Macrolides And Ketolides    Other reaction(s): rash   Codeine Nausea Only        Medication List     TAKE these medications    acetaminophen 325 MG tablet Commonly known as: TYLENOL Take 2 tablets (650 mg total) by mouth every 6 (six) hours as needed for mild pain (  or Fever >/= 101).   amLODipine 5 MG tablet Commonly known as: NORVASC Take 5 mg by mouth at bedtime.   B-12 5000 MCG Caps Take 5,000 mcg by mouth daily.   cetirizine 10 MG tablet Commonly known as: ZYRTEC Take 10 mg by mouth at bedtime.   Co Q-10 300 MG Caps Take 300 mg by mouth daily.   esomeprazole 20 MG capsule Commonly known as: NEXIUM Take 20 mg by mouth daily.   fluocinonide 0.05 % external solution Commonly known as: LIDEX Apply 1 application topically daily. 20 drops or 1 ml on the scalp   fluticasone 50 MCG/ACT nasal spray Commonly known as: FLONASE Place 2 sprays into both nostrils at bedtime.   gabapentin 300 MG capsule Commonly known as: NEURONTIN Take 2 capsules (600 mg total) by mouth at bedtime.    linezolid 600 MG tablet Commonly known as: ZYVOX Take 1 tablet (600 mg total) by mouth every 12 (twelve) hours for 13 days.   ondansetron 4 MG tablet Commonly known as: ZOFRAN Take 4 mg by mouth 3 (three) times daily as needed for nausea or vomiting.   saccharomyces boulardii 250 MG capsule Commonly known as: FLORASTOR Take 250 mg by mouth 2 (two) times daily.   senna-docusate 8.6-50 MG tablet Commonly known as: Senokot-S Take 1 tablet by mouth daily as needed for mild constipation.   solifenacin 5 MG tablet Commonly known as: VESICARE Take 5 mg by mouth every morning.   tamsulosin 0.4 MG Caps capsule Commonly known as: FLOMAX Take 0.4 mg by mouth at bedtime.   traMADol 50 MG tablet Commonly known as: ULTRAM Take 2 tablets (100 mg total) by mouth daily. What changed: when to take this   triamcinolone lotion 0.1 % Commonly known as: KENALOG Apply 1 application topically daily.   Vitamin D3 50 MCG (2000 UT) capsule Take 2,000 Units by mouth daily.       Allergies  Allergen Reactions   Doxazosin Mesylate     Other reaction(s): fatigue   Lisinopril     Other reaction(s): cough   Macrolides And Ketolides     Other reaction(s): rash   Codeine Nausea Only    Follow-up Information     [name], MD. Schedule an appointment as soon as possible for a visit in 2 week(s).   Specialty: Internal Medicine Contact information: [address] [place] [address] [phone]         Ronnette Juniper, MD. Schedule an appointment as soon as possible for a visit in 2 week(s).   Specialty: Gastroenterology Why: f/u in 2-3 weeks. Contact information: [place]  [id number] [phone]                  The results of significant diagnostics from this hospitalization (including imaging, microbiology, ancillary and laboratory) are listed below for reference.    Significant Diagnostic Studies: CT Head Wo  Contrast  Result Date: 03/12/2021 CLINICAL DATA:  Mental status change, unknown cause. EXAM: CT HEAD WITHOUT CONTRAST TECHNIQUE: Contiguous axial images were obtained from the base of the skull through the vertex without intravenous contrast. COMPARISON:  05/08/2013. FINDINGS: Brain: No acute intracranial hemorrhage, midline shift or mass effect. No extra-axial fluid collection. Mild atrophy is noted. Extensive subcortical and periventricular white matter hypodensities are seen bilaterally. There is mild encephalomalacia in the frontal lobe on the right, unchanged from the prior exam. There is a lacunar infarct in the thalamus on the right.  A hypodensity is noted in the subcortical white matter in the anterior frontal lobe on the left, indeterminate in age. No hydrocephalus. Vascular: There is atherosclerotic calcification of the carotid siphons and vertebral arteries. No hyperdense vessel. Skull: Normal. Negative for fracture or focal lesion. Sinuses/Orbits: No acute finding. Other: None. IMPRESSION: 1. Subcortical hypodensity in the frontal lobe on the left anteriorly, possible infarct of indeterminate age. Comparison with more recent imaging studies or MRI is suggested for further evaluation. 2. 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 Electronically Signed   By: Brett Fairy M.D.   On: 03/12/2021 03:37   CT Abdomen Pelvis W Contrast  Result Date: 03/12/2021 CLINICAL DATA:  Abdominal pain, fever, and vomiting. EXAM: CT ABDOMEN AND PELVIS WITH CONTRAST TECHNIQUE: Multidetector CT imaging of the abdomen and pelvis was performed using the standard protocol following bolus administration of intravenous contrast. CONTRAST:  34m OMNIPAQUE IOHEXOL 350 MG/ML SOLN COMPARISON:  02/18/2021. FINDINGS: Lower chest: Bilateral lower lobe bronchiectasis. Strandy atelectasis is present at the lung bases. The  heart is normal in size. Hepatobiliary: Stable hypodensities are present in the liver, likely cysts. No biliary ductal dilatation. Cholecystectomy changes are noted with mild fat stranding in the right upper quadrant. Pancreas: Fat stranding is noted about the pancreatic head. No pancreatic ductal dilatation. Spleen: Normal in size without focal abnormality. Adrenals/Urinary Tract: No adrenal nodule or mass. The kidneys enhance symmetrically. No renal calculus or hydronephrosis. Extrarenal pelvises are noted bilaterally. The bladder is unremarkable. Stomach/Bowel: There is a small hiatal hernia. The stomach is otherwise within normal limits. Fat stranding is noted about the first and second portions of the duodenum suggesting duodenitis. No bowel obstruction, free air or pneumatosis. Scattered diverticula are noted along the colon without evidence of diverticulitis. The appendix is prominent in size at 8 mm with no surrounding inflammatory changes, stable from the previous exam. Vascular/Lymphatic: A few prominent lymph nodes are noted at the porta hepatis, likely reactive. Aortic atherosclerosis without evidence of aneurysm. Reproductive: Prostate gland is enlarged. Other: No free fluid. Musculoskeletal: Degenerative changes are present in the thoracic spine. A stable compression deformity is present at L1. A mild compression deformity is present in the superior endplate at TO75 unchanged. No acute osseous abnormality. IMPRESSION: 1. Findings compatible with pancreatitis/duodenitis. 2. Status post cholecystectomy with fat stranding at the gallbladder fossa, unchanged. 3. Small hiatal hernia. 4. Prominent appendix at 8 mm in diameter, unchanged from the previous exam and likely normal variant. 5. Remaining chronic findings as described above. Electronically Signed   By: LBrett FairyM.D.   On: 03/12/2021 03:50   CT ABDOMEN PELVIS W CONTRAST  Result Date: 02/18/2021 CLINICAL DATA:  87year old male with  epigastric pain. Recent cholecystectomy. EXAM: CT ABDOMEN AND PELVIS WITH CONTRAST TECHNIQUE: Multidetector CT imaging of the abdomen and pelvis was performed using the standard protocol following bolus administration of intravenous contrast. CONTRAST:  1066mOMNIPAQUE IOHEXOL 300 MG/ML  SOLN COMPARISON:  CT Abdomen and Pelvis 01/25/2021 and earlier. FINDINGS: Lower chest: Increased mild lung base atelectasis. No pericardial or pleural effusion. Hepatobiliary: Interval cholecystectomy with associated mild postoperative changes to the ventral abdominal wall. Surgical clips and mild postoperative fat stranding in the gallbladder fossa. No postoperative fluid collection. CBD and intrahepatic bile ducts are stable and within normal limits. Multiple hepatic cysts redemonstrated. Pancreas: Partial pancreatic atrophy. No inflammation or acute ductal dilatation. Spleen: Negative. Adrenals/Urinary Tract: Normal adrenal  glands. Stable nonobstructed kidneys with symmetric renal enhancement and contrast excretion. Extrarenal pelves, normal variant. Ureters are stable and within normal limits. Bladder is stable with mild wall thickening and impression from prostatomegaly on the bladder base. Stomach/Bowel: Similar retained stool in the large bowel. Sigmoid diverticulosis with no active inflammation. Retrocecal appendix is stable and within normal limits. Decompressed terminal ileum. No dilated small bowel. Umbilical ventral abdominal surgical changes with mild regional subcutaneous stranding. Possible small gastric hiatal hernia. Otherwise stomach and duodenum remain within normal limits. No free air or free fluid. Vascular/Lymphatic: Mildly tortuous abdominal aorta and iliac arteries. Mild Aortoiliac calcified atherosclerosis. Major arterial structures are patent. Portal venous system is patent. No lymphadenopathy. Reproductive: Stable prostatomegaly. Other: No pelvic free fluid. Musculoskeletal: Chronic L1 compression fracture  is stable. Mild T10 superior endplate deformity is stable. No acute osseous abnormality identified. IMPRESSION: 1. Interval cholecystectomy with expected/satisfactory postoperative appearance. No fluid collection or other complicating features. 2. Otherwise stable abdomen and pelvis. No acute or inflammatory process identified. Electronically Signed   By: Genevie Ann M.D.   On: 02/18/2021 04:06   MR 3D Recon At Scanner  Result Date: 03/12/2021 CLINICAL DATA:  Elevated. Rubin. Elevated lipase. Elevated white blood cell count. Findings concerning for acute pancreatitis. Evaluate for gallstone pancreatitis. EXAM: MRI ABDOMEN WITHOUT AND WITH CONTRAST (INCLUDING MRCP) TECHNIQUE: Multiplanar multisequence MR imaging of the abdomen was performed both before and after the administration of intravenous contrast. Heavily T2-weighted images of the biliary and pancreatic ducts were obtained, and three-dimensional MRCP images were rendered by post processing. CONTRAST:  56m GADAVIST GADOBUTROL 1 MMOL/ML IV SOLN COMPARISON:  03/12/2021 FINDINGS: MRCP images are degraded by respiratory motion Lower chest:  Lung bases are clear. Hepatobiliary: MRCP images are degraded by respiratory motion. The best series for evaluation of the extrahepatic bile ducts is the coronal haste imaging (series 5). On this T2 weighted series the common bile duct is normal to 5 mm (image 13/5). The common hepatic duct is normal caliber. Along the dorsal surface of the duct there is a small filling defect (image 14/5). This small defect is approximately 1.5 cm from the ampulla. Similar small potential defect is noted on the axial T2 weighted fat-sat series 6 on image 27. Similar small defect noted on the trueFISP imaging (image 21/series 10). Finally on the MRCP sequences (again these are degraded by respiratory motion collection there is suggestion of filling defect is similar level (image 18/series 11). This small filling defect measures approximately 3  mm. Patient status post cholecystectomy. There is no intrahepatic biliary duct dilatation No enhancing lesion within liver. Multiple benign nonenhancing cysts. Pancreas: Pancreatic duct is normal caliber. Minimal pancreatic inflammation on MRI imaging. Small amount fluid along the anterior RIGHT pararenal space towards Morrison's pouch. Spleen: Normal spleen. Adrenals/urinary tract: Adrenal glands and kidneys are normal. Stomach/Bowel: Stomach and limited of the small bowel is unremarkable Vascular/Lymphatic: Abdominal aortic normal caliber. No retroperitoneal periportal lymphadenopathy. Musculoskeletal: No aggressive osseous lesion IMPRESSION: 1. Potential small stone within the common bile duct approximately 1 cm from the ampulla. No intra or extrahepatic biliary duct dilatation. 2. Postcholecystectomy. 3. Minimal pancreatic inflammation with small amount fluid along the anterior RIGHT perirenal space. No organized fluid collections. No pancreatic duct dilatation. 4. Multiple benign appearing hepatic cysts. Electronically Signed   By: SSuzy BouchardM.D.   On: 03/12/2021 14:18   DG Chest Port 1 View  Result Date: 03/12/2021 CLINICAL DATA:  Possible sepsis with fevers and vomiting, initial encounter EXAM: PORTABLE  CHEST 1 VIEW COMPARISON:  02/18/2021 FINDINGS: Cardiac shadow is stable. Lungs are well aerated bilaterally. Minimal left basilar atelectasis is noted. No focal confluent infiltrate is seen. Old rib fractures on the left are noted. IMPRESSION: New left basilar atelectasis.  No other focal abnormality is noted. Electronically Signed   By: Inez Catalina M.D.   On: 03/12/2021 01:52   DG Chest Port 1 View  Result Date: 02/18/2021 CLINICAL DATA:  Chest pain EXAM: PORTABLE CHEST 1 VIEW COMPARISON:  01/25/2021 FINDINGS: The heart size and mediastinal contours are within normal limits. Both lungs are clear. The visualized skeletal structures are unremarkable. IMPRESSION: No active disease.  Electronically Signed   By: Inez Catalina M.D.   On: 02/18/2021 01:10   MR ABDOMEN MRCP W WO CONTAST  Result Date: 03/12/2021 CLINICAL DATA:  Elevated. Rubin. Elevated lipase. Elevated white blood cell count. Findings concerning for acute pancreatitis. Evaluate for gallstone pancreatitis. EXAM: MRI ABDOMEN WITHOUT AND WITH CONTRAST (INCLUDING MRCP) TECHNIQUE: Multiplanar multisequence MR imaging of the abdomen was performed both before and after the administration of intravenous contrast. Heavily T2-weighted images of the biliary and pancreatic ducts were obtained, and three-dimensional MRCP images were rendered by post processing. CONTRAST:  33m GADAVIST GADOBUTROL 1 MMOL/ML IV SOLN COMPARISON:  03/12/2021 FINDINGS: MRCP images are degraded by respiratory motion Lower chest:  Lung bases are clear. Hepatobiliary: MRCP images are degraded by respiratory motion. The best series for evaluation of the extrahepatic bile ducts is the coronal haste imaging (series 5). On this T2 weighted series the common bile duct is normal to 5 mm (image 13/5). The common hepatic duct is normal caliber. Along the dorsal surface of the duct there is a small filling defect (image 14/5). This small defect is approximately 1.5 cm from the ampulla. Similar small potential defect is noted on the axial T2 weighted fat-sat series 6 on image 27. Similar small defect noted on the trueFISP imaging (image 21/series 10). Finally on the MRCP sequences (again these are degraded by respiratory motion collection there is suggestion of filling defect is similar level (image 18/series 11). This small filling defect measures approximately 3 mm. Patient status post cholecystectomy. There is no intrahepatic biliary duct dilatation No enhancing lesion within liver. Multiple benign nonenhancing cysts. Pancreas: Pancreatic duct is normal caliber. Minimal pancreatic inflammation on MRI imaging. Small amount fluid along the anterior RIGHT pararenal space  towards Morrison's pouch. Spleen: Normal spleen. Adrenals/urinary tract: Adrenal glands and kidneys are normal. Stomach/Bowel: Stomach and limited of the small bowel is unremarkable Vascular/Lymphatic: Abdominal aortic normal caliber. No retroperitoneal periportal lymphadenopathy. Musculoskeletal: No aggressive osseous lesion IMPRESSION: 1. Potential small stone within the common bile duct approximately 1 cm from the ampulla. No intra or extrahepatic biliary duct dilatation. 2. Postcholecystectomy. 3. Minimal pancreatic inflammation with small amount fluid along the anterior RIGHT perirenal space. No organized fluid collections. No pancreatic duct dilatation. 4. Multiple benign appearing hepatic cysts. Electronically Signed   By: SSuzy BouchardM.D.   On: 03/12/2021 14:18   ECHOCARDIOGRAM COMPLETE  Result Date: 03/14/2021    ECHOCARDIOGRAM REPORT   Patient Name:   CCAIRO AGOSTINELLIDate of Exam: 03/14/2021 Medical Rec #:  [id number]           Height:       70.0 in Accession #:    [id number]          Weight:       170.6 lb Date of Birth:  [birth year]  BSA:          1.951 m Patient Age:    55 years             BP:           115/69 mmHg Patient Gender: M                    HR:           71 bpm. Exam Location:  Inpatient Procedure: 2D Echo, Cardiac Doppler and Color Doppler Indications:    Bacteremia  History:        Patient has prior history of Echocardiogram examinations, most                 recent 01/26/2021. Arrythmias:LBBB; Risk Factors:Hypertension.  Sonographer:    Merrie Roof RDCS Referring Phys: [place]  1. Left ventricular ejection fraction, by estimation, is 60 to 65%. The left ventricle has normal function. The left ventricle has no regional wall motion abnormalities. Left ventricular diastolic parameters are consistent with Grade I diastolic dysfunction (impaired relaxation).  2. Right ventricular systolic function is normal. The right ventricular size is  normal.  3. The mitral valve is normal in structure. No evidence of mitral valve regurgitation. No evidence of mitral stenosis.  4. The aortic valve is tricuspid. There is mild thickening of the aortic valve. Aortic valve regurgitation is not visualized. Aortic valve sclerosis is present, with no evidence of aortic valve stenosis.  5. The inferior vena cava is normal in size with greater than 50% respiratory variability, suggesting right atrial pressure of 3 mmHg. Conclusion(s)/Recommendation(s): No evidence of valvular vegetations on this transthoracic echocardiogram. Consider a transesophageal echocardiogram to exclude infective endocarditis if clinically indicated. FINDINGS  Left Ventricle: Left ventricular ejection fraction, by estimation, is 60 to 65%. The left ventricle has normal function. The left ventricle has no regional wall motion abnormalities. The left ventricular internal cavity size was normal in size. There is  no left ventricular hypertrophy. Left ventricular diastolic parameters are consistent with Grade I diastolic dysfunction (impaired relaxation). Normal left ventricular filling pressure. Right Ventricle: The right ventricular size is normal. No increase in right ventricular wall thickness. Right ventricular systolic function is normal. Left Atrium: Left atrial size was normal in size. Right Atrium: Right atrial size was normal in size. Pericardium: There is no evidence of pericardial effusion. Mitral Valve: The mitral valve is normal in structure. No evidence of mitral valve regurgitation. No evidence of mitral valve stenosis. Tricuspid Valve: The tricuspid valve is normal in structure. Tricuspid valve regurgitation is not demonstrated. No evidence of tricuspid stenosis. Aortic Valve: The aortic valve is tricuspid. There is mild thickening of the aortic valve. Aortic valve regurgitation is not visualized. Aortic valve sclerosis is present, with no evidence of aortic valve stenosis. Aortic  valve mean gradient measures 4.0  mmHg. Aortic valve peak gradient measures 6.2 mmHg. Aortic valve area, by VTI measures 3.08 cm. Pulmonic Valve: The pulmonic valve was normal in structure. Pulmonic valve regurgitation is not visualized. No evidence of pulmonic stenosis. Aorta: The aortic root is normal in size and structure. Venous: The inferior vena cava is normal in size with greater than 50% respiratory variability, suggesting right atrial pressure of 3 mmHg. IAS/Shunts: No atrial level shunt detected by color flow Doppler.  LEFT VENTRICLE PLAX 2D LVIDd:         4.60 cm   Diastology LVIDs:  3.00 cm   LV e' medial:    7.51 cm/s LV PW:         1.10 cm   LV E/e' medial:  10.3 LV IVS:        1.20 cm   LV e' lateral:   10.10 cm/s LVOT diam:     2.00 cm   LV E/e' lateral: 7.6 LV SV:         84 LV SV Index:   43 LVOT Area:     3.14 cm  RIGHT VENTRICLE RV Basal diam:  3.40 cm LEFT ATRIUM           Index        RIGHT ATRIUM           Index LA diam:      3.50 cm 1.79 cm/m   RA Area:     17.10 cm LA Vol (A2C): 71.4 ml 36.59 ml/m  RA Volume:   38.20 ml  19.58 ml/m LA Vol (A4C): 54.1 ml 27.73 ml/m  AORTIC VALVE AV Area (Vmax):    3.17 cm AV Area (Vmean):   2.54 cm AV Area (VTI):     3.08 cm AV Vmax:           124.00 cm/s AV Vmean:          97.700 cm/s AV VTI:            0.271 m AV Peak Grad:      6.2 mmHg AV Mean Grad:      4.0 mmHg LVOT Vmax:         125.00 cm/s LVOT Vmean:        79.100 cm/s LVOT VTI:          0.266 m LVOT/AV VTI ratio: 0.98  AORTA Ao Root diam: 3.70 cm Ao Asc diam:  3.20 cm MITRAL VALVE MV Area (PHT): 3.77 cm     SHUNTS MV Decel Time: 201 msec     Systemic VTI:  0.27 m MV E velocity: 77.10 cm/s   Systemic Diam: 2.00 cm MV A velocity: 112.00 cm/s MV E/A ratio:  0.69 Mihai Croitoru MD Electronically signed by Sanda Klein MD Signature Date/Time: 03/14/2021/3:05:25 PM    Final     Microbiology: Recent Results (from the past 240 hour(s))  Blood Culture (routine x 2)     Status:  Abnormal   Collection Time: 03/12/21 12:41 AM   Specimen: BLOOD  Result Value Ref Range Status   Specimen Description   Final    BLOOD RIGHT ANTECUBITAL Performed at [facility], [place] [address], [place], [place] [address]    Special Requests   Final    BOTTLES DRAWN AEROBIC AND ANAEROBIC Blood Culture results may not be optimal due to an excessive volume of blood received in culture bottles Performed at [facility] [address] [place], [place] [address]    Culture  Setup Time   Final    GRAM POSITIVE COCCI IN PAIRS AND CHAINS IN BOTH AEROBIC AND ANAEROBIC BOTTLES CRITICAL RESULT CALLED TO, READ BACK BY AND VERIFIED WITH: L POINDEXTER,[email or web address]  03/12/21 [place] Performed at [facility], [place] [address] [place], [place] [address]    Culture ENTEROCOCCUS FAECIUM (A)  Final   Report Status 03/14/2021 FINAL  Final   Organism ID, Bacteria ENTEROCOCCUS FAECIUM  Final      Susceptibility   Enterococcus faecium - MIC*    AMPICILLIN RESISTANT Resistant     VANCOMYCIN <=  0.5 SENSITIVE Sensitive     GENTAMICIN SYNERGY SENSITIVE Sensitive     * ENTEROCOCCUS FAECIUM  Urine Culture     Status: None   Collection Time: 03/12/21 12:41 AM   Specimen: In/Out Cath Urine  Result Value Ref Range Status   Specimen Description   Final    IN/OUT CATH URINE Performed at [facility] [address], [place], [place] [address]    Special Requests   Final    NONE Performed at [facility], [place] [address], [place], [place] [address]    Culture   Final    NO GROWTH Performed at [facility], [place] [address], [place], [place] [address]    Report Status 03/13/2021 FINAL  Final  Resp Panel by RT-PCR (Flu A&B, Covid) Nasopharyngeal Swab     Status: None   Collection Time: 03/12/21 12:41 AM   Specimen: Nasopharyngeal Swab; Nasopharyngeal(NP) swabs in vial transport medium  Result Value Ref Range Status   SARS  Coronavirus 2 by RT PCR NEGATIVE NEGATIVE Final    Comment: (NOTE) SARS-CoV-2 target nucleic acids are NOT DETECTED.  The SARS-CoV-2 RNA is generally detectable in upper respiratory specimens during the acute phase of infection. The lowest concentration of SARS-CoV-2 viral copies this assay can detect is 138 copies/mL. A negative result does not preclude SARS-Cov-2 infection and should not be used as the sole basis for treatment or other patient management decisions. A negative result may occur with  improper specimen collection/handling, submission of specimen other than nasopharyngeal swab, presence of viral mutation(s) within the areas targeted by this assay, and inadequate number of viral copies(<138 copies/mL). A negative result must be combined with clinical observations, patient history, and epidemiological information. The expected result is Negative.  Fact Sheet for Patients:  EntrepreneurPulse.com.au  Fact Sheet for Healthcare Providers:  IncredibleEmployment.be  This test is no t yet approved or cleared by the [place] FDA and  has been authorized for detection and/or diagnosis of SARS-CoV-2 by FDA under an Emergency Use Authorization (EUA). This EUA will remain  in effect (meaning this test can be used) for the duration of the COVID-19 declaration under Section 564(b)(1) of the Act, 21 U.S.C.section 360bbb-3(b)(1), unless the authorization is terminated  or revoked sooner.       Influenza A by PCR NEGATIVE NEGATIVE Final   Influenza B by PCR NEGATIVE NEGATIVE Final    Comment: (NOTE) The Xpert Xpress SARS-CoV-2/FLU/RSV plus assay is intended as an aid in the diagnosis of influenza from Nasopharyngeal swab specimens and should not be used as a sole basis for treatment. Nasal washings and aspirates are unacceptable for Xpert Xpress SARS-CoV-2/FLU/RSV testing.  Fact Sheet for  Patients: EntrepreneurPulse.com.au  Fact Sheet for Healthcare Providers: IncredibleEmployment.be  This test is not yet approved or cleared by the [place] FDA and has been authorized for detection and/or diagnosis of SARS-CoV-2 by FDA under an Emergency Use Authorization (EUA). This EUA will remain in effect (meaning this test can be used) for the duration of the COVID-19 declaration under Section 564(b)(1) of the Act, 21 U.S.C. section 360bbb-3(b)(1), unless the authorization is terminated or revoked.  Performed at [facility], [place] [address], [place], [place] [address]   Blood Culture ID Panel (Reflexed)     Status: Abnormal   Collection Time: 03/12/21 12:41 AM  Result Value Ref Range Status   Enterococcus faecalis NOT DETECTED NOT DETECTED Final   Enterococcus Faecium DETECTED (A) NOT DETECTED Final  Comment: CRITICAL RESULT CALLED TO, READ BACK BY AND VERIFIED WITH: L POINDEXTER,[email or web address]  03/12/21 [place]    Listeria monocytogenes NOT DETECTED NOT DETECTED Final   Staphylococcus species NOT DETECTED NOT DETECTED Final   Staphylococcus aureus (BCID) NOT DETECTED NOT DETECTED Final   Staphylococcus epidermidis NOT DETECTED NOT DETECTED Final   Staphylococcus lugdunensis NOT DETECTED NOT DETECTED Final   Streptococcus species NOT DETECTED NOT DETECTED Final   Streptococcus agalactiae NOT DETECTED NOT DETECTED Final   Streptococcus pneumoniae NOT DETECTED NOT DETECTED Final   Streptococcus pyogenes NOT DETECTED NOT DETECTED Final   A.calcoaceticus-baumannii NOT DETECTED NOT DETECTED Final   Bacteroides fragilis NOT DETECTED NOT DETECTED Final   Enterobacterales NOT DETECTED NOT DETECTED Final   Enterobacter cloacae complex NOT DETECTED NOT DETECTED Final   Escherichia coli NOT DETECTED NOT DETECTED Final   Klebsiella aerogenes NOT DETECTED NOT DETECTED Final   Klebsiella oxytoca NOT DETECTED NOT DETECTED Final    Klebsiella pneumoniae NOT DETECTED NOT DETECTED Final   Proteus species NOT DETECTED NOT DETECTED Final   Salmonella species NOT DETECTED NOT DETECTED Final   Serratia marcescens NOT DETECTED NOT DETECTED Final   Haemophilus influenzae NOT DETECTED NOT DETECTED Final   Neisseria meningitidis NOT DETECTED NOT DETECTED Final   Pseudomonas aeruginosa NOT DETECTED NOT DETECTED Final   Stenotrophomonas maltophilia NOT DETECTED NOT DETECTED Final   Candida albicans NOT DETECTED NOT DETECTED Final   Candida auris NOT DETECTED NOT DETECTED Final   Candida glabrata NOT DETECTED NOT DETECTED Final   Candida krusei NOT DETECTED NOT DETECTED Final   Candida parapsilosis NOT DETECTED NOT DETECTED Final   Candida tropicalis NOT DETECTED NOT DETECTED Final   Cryptococcus neoformans/gattii NOT DETECTED NOT DETECTED Final   Vancomycin resistance NOT DETECTED NOT DETECTED Final    Comment: Performed at [facility], [address] [place], [place] [address]  Blood Culture (routine x 2)     Status: None (Preliminary result)   Collection Time: 03/12/21 12:46 AM   Specimen: BLOOD  Result Value Ref Range Status   Specimen Description   Final    BLOOD RIGHT WRIST Performed at [facility] [address], [place], [place] [address]    Special Requests   Final    BOTTLES DRAWN AEROBIC AND ANAEROBIC Blood Culture adequate volume Performed at [facility] [place] [place] [address], [place], [place] [address]    Culture   Final    NO GROWTH 3 DAYS Performed at [facility], [place] [address], [place], [place] [address]    Report Status PENDING  Incomplete  Culture, blood (Routine X 2) w Reflex to ID Panel     Status: None (Preliminary result)   Collection Time: 03/14/21 12:21 PM   Specimen: BLOOD  Result Value Ref Range Status   Specimen Description   Final    BLOOD BLOOD LEFT HAND Performed at [facility] [address] [place], [place]  [address]    Special Requests   Final    BOTTLES DRAWN AEROBIC ONLY Blood Culture results may not be optimal due to an inadequate volume of blood received in culture bottles Performed at [facility] [address] [place], [place] [address]    Culture   Final    NO GROWTH < 24 HOURS Performed at [facility] [address], [place],  [address]    Report Status PENDING  Incomplete  Culture, blood (Routine X 2) w  Reflex to ID Panel     Status: None (Preliminary result)   Collection Time: 03/14/21 12:29 PM   Specimen: BLOOD  Result Value Ref Range Status   Specimen Description   Final    BLOOD LEFT ANTECUBITAL Performed at [facility] [address] [place], [place] [address]    Special Requests   Final    BOTTLES DRAWN AEROBIC AND ANAEROBIC Blood Culture adequate volume Performed at [facility] [address] [place], [place] [address]    Culture   Final    NO GROWTH < 24 HOURS Performed at [facility] [address] [place], [place] [address]    Report Status PENDING  Incomplete     Labs: Basic Metabolic Panel: Recent Labs  Lab 03/12/21 0041 03/13/21 0552 03/14/21 0512 03/15/21 0510  NA 137 137 136 136  K 3.8 3.6 3.5 3.7  CL 105 102 102 105  CO2 24 29 28 24   GLUCOSE 137* 96 107* 136*  BUN 24* 16 19 15   CREATININE 1.28* 1.18 1.37* 1.15  CALCIUM 9.1 8.2* 8.3* 8.0*  MG  --  1.9  --  1.9   Liver Function Tests: Recent Labs  Lab 03/12/21 0041 03/13/21 0552 03/14/21 0512 03/15/21 0510  AST 516* 147* 63* 32  ALT 430* 259* 172* 113*  ALKPHOS 154* 116 114 121  BILITOT 2.5* 2.3* 1.2 0.8  PROT 8.7* 6.6 6.6 6.9  ALBUMIN 4.7 3.2* 3.1* 3.0*   Recent Labs  Lab 03/12/21 0041  LIPASE 591*   No results for input(s): AMMONIA in the last 168 hours. CBC: Recent Labs  Lab 03/12/21 0041 03/13/21 0552 03/15/21 0510  WBC 22.8* 15.1* 10.4  NEUTROABS 21.0* 12.1* 7.4  HGB 14.5 12.7*  12.8*  HCT 43.9 38.2* 39.0  MCV 87.3 87.6 88.0  PLT 283 205 223   Cardiac Enzymes: No results for input(s): CKTOTAL, CKMB, CKMBINDEX, TROPONINI in the last 168 hours. BNP: BNP (last 3 results) Recent Labs    01/26/21 0934  BNP 97.9    ProBNP (last 3 results) No results for input(s): PROBNP in the last 8760 hours.  CBG: No results for input(s): GLUCAP in the last 168 hours.     Signed:  Irine Seal MD.  [facility] 03/15/2021, 10:55 AM

## 2021-03-17 ENCOUNTER — Ambulatory Visit (HOSPITAL_COMMUNITY): Payer: Medicare Other

## 2021-03-17 LAB — CULTURE, BLOOD (ROUTINE X 2)
Culture: NO GROWTH
Special Requests: ADEQUATE

## 2021-03-19 LAB — CULTURE, BLOOD (ROUTINE X 2)
Culture: NO GROWTH
Culture: NO GROWTH
Special Requests: ADEQUATE

## 2021-03-23 DIAGNOSIS — L218 Other seborrheic dermatitis: Secondary | ICD-10-CM | POA: Diagnosis not present

## 2021-03-23 DIAGNOSIS — L821 Other seborrheic keratosis: Secondary | ICD-10-CM | POA: Diagnosis not present

## 2021-03-23 DIAGNOSIS — Z85828 Personal history of other malignant neoplasm of skin: Secondary | ICD-10-CM | POA: Diagnosis not present

## 2021-03-25 DIAGNOSIS — D72828 Other elevated white blood cell count: Secondary | ICD-10-CM | POA: Diagnosis not present

## 2021-03-25 DIAGNOSIS — R7989 Other specified abnormal findings of blood chemistry: Secondary | ICD-10-CM | POA: Diagnosis not present

## 2021-03-25 DIAGNOSIS — Z8619 Personal history of other infectious and parasitic diseases: Secondary | ICD-10-CM | POA: Diagnosis not present

## 2021-03-25 DIAGNOSIS — Z8719 Personal history of other diseases of the digestive system: Secondary | ICD-10-CM | POA: Diagnosis not present

## 2021-03-28 ENCOUNTER — Telehealth (HOSPITAL_COMMUNITY): Payer: Self-pay | Admitting: *Deleted

## 2021-03-28 NOTE — Telephone Encounter (Signed)
Patient given detailed instructions per Myocardial Perfusion Study Information Sheet for the test on 04/04/21 at 8:15. Patient notified to arrive 15 minutes early and that it is imperative to arrive on time for appointment to keep from having the test rescheduled.  If you need to cancel or reschedule your appointment, please call the office within 24 hours of your appointment. . Patient verbalized understanding.Timothy Khan

## 2021-04-04 ENCOUNTER — Other Ambulatory Visit: Payer: Self-pay

## 2021-04-04 ENCOUNTER — Ambulatory Visit (HOSPITAL_COMMUNITY): Payer: Medicare Other | Attending: Cardiology

## 2021-04-04 DIAGNOSIS — R072 Precordial pain: Secondary | ICD-10-CM | POA: Insufficient documentation

## 2021-04-04 DIAGNOSIS — R079 Chest pain, unspecified: Secondary | ICD-10-CM | POA: Insufficient documentation

## 2021-04-04 LAB — MYOCARDIAL PERFUSION IMAGING
LV dias vol: 50 mL (ref 62–150)
LV sys vol: 17 mL
Nuc Stress EF: 66 %
Peak HR: 108 {beats}/min
Rest HR: 85 {beats}/min
Rest Nuclear Isotope Dose: 10.7 mCi
SDS: 1
SRS: 0
SSS: 1
ST Depression (mm): 0 mm
Stress Nuclear Isotope Dose: 32.7 mCi
TID: 0.94

## 2021-04-04 MED ORDER — REGADENOSON 0.4 MG/5ML IV SOLN
0.4000 mg | Freq: Once | INTRAVENOUS | Status: AC
  Administered 2021-04-04: 0.4 mg via INTRAVENOUS

## 2021-04-04 MED ORDER — TECHNETIUM TC 99M TETROFOSMIN IV KIT
10.7000 | PACK | Freq: Once | INTRAVENOUS | Status: AC | PRN
  Administered 2021-04-04: 10.7 via INTRAVENOUS
  Filled 2021-04-04: qty 11

## 2021-04-04 MED ORDER — TECHNETIUM TC 99M TETROFOSMIN IV KIT
32.7000 | PACK | Freq: Once | INTRAVENOUS | Status: AC | PRN
  Administered 2021-04-04: 32.7 via INTRAVENOUS
  Filled 2021-04-04: qty 33

## 2021-04-08 ENCOUNTER — Other Ambulatory Visit: Payer: Self-pay | Admitting: Gastroenterology

## 2021-04-08 DIAGNOSIS — Z8719 Personal history of other diseases of the digestive system: Secondary | ICD-10-CM | POA: Diagnosis not present

## 2021-04-08 DIAGNOSIS — K8050 Calculus of bile duct without cholangitis or cholecystitis without obstruction: Secondary | ICD-10-CM

## 2021-04-19 DIAGNOSIS — G8929 Other chronic pain: Secondary | ICD-10-CM | POA: Diagnosis not present

## 2021-04-19 DIAGNOSIS — N1831 Chronic kidney disease, stage 3a: Secondary | ICD-10-CM | POA: Diagnosis not present

## 2021-04-19 DIAGNOSIS — E782 Mixed hyperlipidemia: Secondary | ICD-10-CM | POA: Diagnosis not present

## 2021-04-19 DIAGNOSIS — K219 Gastro-esophageal reflux disease without esophagitis: Secondary | ICD-10-CM | POA: Diagnosis not present

## 2021-04-19 DIAGNOSIS — I129 Hypertensive chronic kidney disease with stage 1 through stage 4 chronic kidney disease, or unspecified chronic kidney disease: Secondary | ICD-10-CM | POA: Diagnosis not present

## 2021-04-19 DIAGNOSIS — I10 Essential (primary) hypertension: Secondary | ICD-10-CM | POA: Diagnosis not present

## 2021-04-19 DIAGNOSIS — E781 Pure hyperglyceridemia: Secondary | ICD-10-CM | POA: Diagnosis not present

## 2021-04-21 DIAGNOSIS — R600 Localized edema: Secondary | ICD-10-CM | POA: Diagnosis not present

## 2021-04-21 DIAGNOSIS — L03116 Cellulitis of left lower limb: Secondary | ICD-10-CM | POA: Diagnosis not present

## 2021-04-22 ENCOUNTER — Other Ambulatory Visit (HOSPITAL_COMMUNITY): Payer: Self-pay | Admitting: Internal Medicine

## 2021-04-22 ENCOUNTER — Other Ambulatory Visit: Payer: Self-pay

## 2021-04-22 ENCOUNTER — Ambulatory Visit (HOSPITAL_COMMUNITY)
Admission: RE | Admit: 2021-04-22 | Discharge: 2021-04-22 | Disposition: A | Discharge status: Home / Self Care | Payer: Medicare Other | Source: Ambulatory Visit | Attending: Internal Medicine | Admitting: Internal Medicine

## 2021-04-22 DIAGNOSIS — R600 Localized edema: Secondary | ICD-10-CM

## 2021-04-29 ENCOUNTER — Ambulatory Visit
Admission: RE | Admit: 2021-04-29 | Discharge: 2021-04-29 | Disposition: A | Discharge status: Home / Self Care | Payer: Medicare Other | Source: Ambulatory Visit | Attending: Gastroenterology | Admitting: Gastroenterology

## 2021-04-29 DIAGNOSIS — K838 Other specified diseases of biliary tract: Secondary | ICD-10-CM | POA: Diagnosis not present

## 2021-04-29 DIAGNOSIS — L03116 Cellulitis of left lower limb: Secondary | ICD-10-CM | POA: Diagnosis not present

## 2021-04-29 DIAGNOSIS — N281 Cyst of kidney, acquired: Secondary | ICD-10-CM | POA: Diagnosis not present

## 2021-04-29 DIAGNOSIS — K8050 Calculus of bile duct without cholangitis or cholecystitis without obstruction: Secondary | ICD-10-CM

## 2021-04-29 DIAGNOSIS — K7689 Other specified diseases of liver: Secondary | ICD-10-CM | POA: Diagnosis not present

## 2021-04-29 MED ORDER — IOPAMIDOL (ISOVUE-300) INJECTION 61%
100.0000 mL | Freq: Once | INTRAVENOUS | Status: AC | PRN
  Administered 2021-04-29: 100 mL via INTRAVENOUS

## 2021-05-06 DIAGNOSIS — L03116 Cellulitis of left lower limb: Secondary | ICD-10-CM | POA: Diagnosis not present

## 2021-06-02 ENCOUNTER — Other Ambulatory Visit: Payer: Self-pay

## 2021-06-02 ENCOUNTER — Emergency Department (HOSPITAL_COMMUNITY): Payer: Medicare Other

## 2021-06-02 ENCOUNTER — Inpatient Hospital Stay (HOSPITAL_COMMUNITY)
Admission: EM | Admit: 2021-06-02 | Discharge: 2021-06-06 | DRG: 439 | Disposition: A | Discharge status: Home Health | Payer: Medicare Other | Attending: Student | Admitting: Student

## 2021-06-02 ENCOUNTER — Encounter (HOSPITAL_COMMUNITY): Payer: Self-pay

## 2021-06-02 ENCOUNTER — Inpatient Hospital Stay (HOSPITAL_COMMUNITY): Payer: Medicare Other

## 2021-06-02 DIAGNOSIS — Z885 Allergy status to narcotic agent status: Secondary | ICD-10-CM

## 2021-06-02 DIAGNOSIS — Z91199 Patient's noncompliance with other medical treatment and regimen due to unspecified reason: Secondary | ICD-10-CM

## 2021-06-02 DIAGNOSIS — R1011 Right upper quadrant pain: Secondary | ICD-10-CM | POA: Diagnosis not present

## 2021-06-02 DIAGNOSIS — N1831 Chronic kidney disease, stage 3a: Secondary | ICD-10-CM

## 2021-06-02 DIAGNOSIS — Z87891 Personal history of nicotine dependence: Secondary | ICD-10-CM | POA: Diagnosis not present

## 2021-06-02 DIAGNOSIS — R3915 Urgency of urination: Secondary | ICD-10-CM | POA: Diagnosis present

## 2021-06-02 DIAGNOSIS — N390 Urinary tract infection, site not specified: Secondary | ICD-10-CM | POA: Diagnosis not present

## 2021-06-02 DIAGNOSIS — Z888 Allergy status to other drugs, medicaments and biological substances status: Secondary | ICD-10-CM

## 2021-06-02 DIAGNOSIS — Z9049 Acquired absence of other specified parts of digestive tract: Secondary | ICD-10-CM | POA: Diagnosis not present

## 2021-06-02 DIAGNOSIS — N179 Acute kidney failure, unspecified: Secondary | ICD-10-CM | POA: Diagnosis present

## 2021-06-02 DIAGNOSIS — R7401 Elevation of levels of liver transaminase levels: Secondary | ICD-10-CM | POA: Diagnosis not present

## 2021-06-02 DIAGNOSIS — I130 Hypertensive heart and chronic kidney disease with heart failure and stage 1 through stage 4 chronic kidney disease, or unspecified chronic kidney disease: Secondary | ICD-10-CM | POA: Diagnosis not present

## 2021-06-02 DIAGNOSIS — E785 Hyperlipidemia, unspecified: Secondary | ICD-10-CM | POA: Diagnosis not present

## 2021-06-02 DIAGNOSIS — R509 Fever, unspecified: Secondary | ICD-10-CM

## 2021-06-02 DIAGNOSIS — E875 Hyperkalemia: Secondary | ICD-10-CM

## 2021-06-02 DIAGNOSIS — Z20822 Contact with and (suspected) exposure to covid-19: Secondary | ICD-10-CM | POA: Diagnosis not present

## 2021-06-02 DIAGNOSIS — I5032 Chronic diastolic (congestive) heart failure: Secondary | ICD-10-CM | POA: Diagnosis not present

## 2021-06-02 DIAGNOSIS — Z85828 Personal history of other malignant neoplasm of skin: Secondary | ICD-10-CM | POA: Diagnosis not present

## 2021-06-02 DIAGNOSIS — G629 Polyneuropathy, unspecified: Secondary | ICD-10-CM | POA: Diagnosis present

## 2021-06-02 DIAGNOSIS — K8051 Calculus of bile duct without cholangitis or cholecystitis with obstruction: Secondary | ICD-10-CM

## 2021-06-02 DIAGNOSIS — K7689 Other specified diseases of liver: Secondary | ICD-10-CM | POA: Diagnosis not present

## 2021-06-02 DIAGNOSIS — R1084 Generalized abdominal pain: Secondary | ICD-10-CM | POA: Diagnosis not present

## 2021-06-02 DIAGNOSIS — K760 Fatty (change of) liver, not elsewhere classified: Secondary | ICD-10-CM | POA: Diagnosis not present

## 2021-06-02 DIAGNOSIS — R935 Abnormal findings on diagnostic imaging of other abdominal regions, including retroperitoneum: Secondary | ICD-10-CM | POA: Diagnosis not present

## 2021-06-02 DIAGNOSIS — G4733 Obstructive sleep apnea (adult) (pediatric): Secondary | ICD-10-CM

## 2021-06-02 DIAGNOSIS — I10 Essential (primary) hypertension: Secondary | ICD-10-CM | POA: Diagnosis not present

## 2021-06-02 DIAGNOSIS — R531 Weakness: Secondary | ICD-10-CM | POA: Diagnosis not present

## 2021-06-02 DIAGNOSIS — K219 Gastro-esophageal reflux disease without esophagitis: Secondary | ICD-10-CM | POA: Diagnosis not present

## 2021-06-02 DIAGNOSIS — K859 Acute pancreatitis without necrosis or infection, unspecified: Secondary | ICD-10-CM | POA: Diagnosis not present

## 2021-06-02 DIAGNOSIS — Z9852 Vasectomy status: Secondary | ICD-10-CM

## 2021-06-02 DIAGNOSIS — N400 Enlarged prostate without lower urinary tract symptoms: Secondary | ICD-10-CM

## 2021-06-02 DIAGNOSIS — N3000 Acute cystitis without hematuria: Secondary | ICD-10-CM | POA: Diagnosis not present

## 2021-06-02 DIAGNOSIS — R748 Abnormal levels of other serum enzymes: Secondary | ICD-10-CM

## 2021-06-02 DIAGNOSIS — Z8249 Family history of ischemic heart disease and other diseases of the circulatory system: Secondary | ICD-10-CM | POA: Diagnosis not present

## 2021-06-02 DIAGNOSIS — K851 Biliary acute pancreatitis without necrosis or infection: Secondary | ICD-10-CM | POA: Diagnosis not present

## 2021-06-02 DIAGNOSIS — Z79899 Other long term (current) drug therapy: Secondary | ICD-10-CM | POA: Diagnosis not present

## 2021-06-02 DIAGNOSIS — K8050 Calculus of bile duct without cholangitis or cholecystitis without obstruction: Secondary | ICD-10-CM | POA: Diagnosis not present

## 2021-06-02 DIAGNOSIS — R932 Abnormal findings on diagnostic imaging of liver and biliary tract: Secondary | ICD-10-CM | POA: Diagnosis not present

## 2021-06-02 LAB — COMPREHENSIVE METABOLIC PANEL
ALT: 539 U/L — ABNORMAL HIGH (ref 0–44)
AST: 384 U/L — ABNORMAL HIGH (ref 15–41)
Albumin: 4.2 g/dL (ref 3.5–5.0)
Alkaline Phosphatase: 188 U/L — ABNORMAL HIGH (ref 38–126)
Anion gap: 7 (ref 5–15)
BUN: 24 mg/dL — ABNORMAL HIGH (ref 8–23)
CO2: 26 mmol/L (ref 22–32)
Calcium: 8.4 mg/dL — ABNORMAL LOW (ref 8.9–10.3)
Chloride: 103 mmol/L (ref 98–111)
Creatinine, Ser: 1.44 mg/dL — ABNORMAL HIGH (ref 0.61–1.24)
GFR, Estimated: 47 mL/min — ABNORMAL LOW (ref >60)
Glucose, Bld: 105 mg/dL — ABNORMAL HIGH (ref 70–99)
Potassium: 5.3 mmol/L — ABNORMAL HIGH (ref 3.5–5.1)
Sodium: 136 mmol/L (ref 135–145)
Total Bilirubin: 2.2 mg/dL — ABNORMAL HIGH (ref 0.3–1.2)
Total Protein: 8.1 g/dL (ref 6.5–8.1)

## 2021-06-02 LAB — URINALYSIS, ROUTINE W REFLEX MICROSCOPIC
Bacteria, UA: NONE SEEN
Bilirubin Urine: NEGATIVE
Glucose, UA: NEGATIVE mg/dL
Hgb urine dipstick: NEGATIVE
Ketones, ur: NEGATIVE mg/dL
Nitrite: NEGATIVE
Protein, ur: 100 mg/dL — AB
Specific Gravity, Urine: 1.02 (ref 1.005–1.030)
pH: 6 (ref 5.0–8.0)

## 2021-06-02 LAB — CBC
HCT: 42.9 % (ref 39.0–52.0)
Hemoglobin: 14.1 g/dL (ref 13.0–17.0)
MCH: 29.3 pg (ref 26.0–34.0)
MCHC: 32.9 g/dL (ref 30.0–36.0)
MCV: 89.2 fL (ref 80.0–100.0)
Platelets: 268 10*3/uL (ref 150–400)
RBC: 4.81 MIL/uL (ref 4.22–5.81)
RDW: 14 % (ref 11.5–15.5)
WBC: 10.9 10*3/uL — ABNORMAL HIGH (ref 4.0–10.5)
nRBC: 0 % (ref 0.0–0.2)

## 2021-06-02 LAB — LIPASE, BLOOD: Lipase: 349 U/L — ABNORMAL HIGH (ref 11–51)

## 2021-06-02 MED ORDER — SODIUM CHLORIDE 0.9 % IV SOLN
INTRAVENOUS | Status: DC | PRN
Start: 2021-06-02 — End: 2021-06-03

## 2021-06-02 NOTE — ED Notes (Signed)
Patient transported to MRI 

## 2021-06-02 NOTE — ED Provider Notes (Signed)
?[facility] DEPT ?Provider Note ? ?CSN: [id number] ?Arrival date & time: 06/02/21 1922 ? ?Chief Complaint(s) ?Abdominal Pain ? ?HPI ?Timothy Khan is a 88 y.o. male who presents the emergency department for evaluation of abnormal labs and postprandial abdominal pain.  Patient has a history of postprandial abdominal pain that was recently diagnosed as gallstone pancreatitis and acute cholecystitis in December and is status post gallbladder removal.  He states he had improvement but over the last 3 days has had worsening postprandial abdominal pain.  He saw his primary care physician who found the patient to have a new transaminitis with elevated lipase and sent the patient to the emergency department for evaluation.  On arrival, patient states he has no abdominal pain unless he is eating or lying flat.  Denies chest pain, shortness of breath, nausea, vomiting or other systemic symptoms. ? ? ?Abdominal Pain ? ?Past Medical History ?Past Medical History:  ?Diagnosis Date  ? Abnormal brain MRI   ? Right side  ? Basal cell carcinoma of back   ? BPH (benign prostatic hyperplasia)   ? Chronic cough   ? CKD (chronic kidney disease), stage III ([place])   ? Concussion 1950  ? Baseball injury  ? Diastolic dysfunction   ? ED (erectile dysfunction)   ? Foot drop, bilateral 05/02/2013  ? Gait disorder   ? Gait disturbance   ? Gastroesophageal reflux disease   ? Hyperlipidemia   ? Hypertension   ? Libido, decreased   ? Lumbago   ? Memory loss   ? Onychomycosis   ? OSA (obstructive sleep apnea)   ? Peripheral neuropathy   ? PSA elevation   ? SDH (subdural hematoma) 04/30/2013  ? Skull fracture ([place])   ? Skull fracture ([place])   ? Sleep apnea   ? ?Patient Active Problem List  ? Diagnosis Date Noted  ? Transaminitis   ? Elevated LFTs -cannot rule out retained/de novo biliary stone 03/12/2021  ? Sepsis with acute organ dysfunction without septic shock ([place]) 03/12/2021  ? Chronic kidney disease, stage III  (moderate) ([facility]) 03/01/2021  ? Former smoker 03/01/2021  ? Family history of early CAD 03/01/2021  ? OSA (obstructive sleep apnea) 07/25/2020  ? Acute pancreatitis 06/06/2018  ? BPH (benign prostatic hyperplasia) 06/06/2018  ? LBBB (left bundle branch block) 06/06/2018  ? GERD (gastroesophageal reflux disease) 06/06/2018  ? Hiatal hernia 06/06/2018  ? Foot drop, bilateral 05/02/2013  ? Polyneuropathy 04/30/2013  ? Hereditary and idiopathic peripheral neuropathy 03/18/2012  ? Abnormality of gait 03/18/2012  ? Hyperlipidemia 03/18/2012  ? Hypertension 03/18/2012  ? ?Home Medication(s) ?Prior to Admission medications   ?Medication Sig Start Date End Date Taking? Authorizing Provider  ?acetaminophen (TYLENOL) 325 MG tablet Take 2 tablets (650 mg total) by mouth every 6 (six) hours as needed for mild pain (or Fever >/= 101). 03/15/21   Eugenie Filler, MD  ?amLODipine (NORVASC) 5 MG tablet Take 5 mg by mouth at bedtime. 06/24/18   [provider]  ?cetirizine (ZYRTEC) 10 MG tablet Take 10 mg by mouth at bedtime.     [provider]  ?Cholecalciferol (VITAMIN D3) 2000 UNITS capsule Take 2,000 Units by mouth daily.    [provider]  ?Coenzyme Q10 (CO Q-10) 300 MG CAPS Take 300 mg by mouth daily.    [provider]  ?Cyanocobalamin (B-12) 5000 MCG CAPS Take 5,000 mcg by mouth daily.    [provider]  ?esomeprazole (NEXIUM) 20 MG capsule Take  20 mg by mouth daily.     [provider]  ?fluocinonide (LIDEX) 0.05 % external solution Apply 1 application topically daily. 20 drops or 1 ml on the scalp 02/13/20   [provider]  ?fluticasone (FLONASE) 50 MCG/ACT nasal spray Place 2 sprays into both nostrils at bedtime.  04/16/13   [provider]  ?gabapentin (NEURONTIN) 300 MG capsule Take 2 capsules (600 mg total) by mouth at bedtime. 12/04/17   Kathrynn Ducking, MD  ?ondansetron (ZOFRAN) 4 MG tablet Take 4 mg by mouth 3 (three) times daily as needed  for nausea or vomiting. 01/05/20   [provider]  ?saccharomyces boulardii (FLORASTOR) 250 MG capsule Take 250 mg by mouth 2 (two) times daily.    [provider]  ?senna-docusate (SENOKOT-S) 8.6-50 MG tablet Take 1 tablet by mouth daily as needed for mild constipation.    [provider]  ?solifenacin (VESICARE) 5 MG tablet Take 5 mg by mouth every morning. 03/02/21   [provider]  ?tamsulosin (FLOMAX) 0.4 MG CAPS Take 0.4 mg by mouth at bedtime.     [provider]  ?traMADol (ULTRAM) 50 MG tablet Take 2 tablets (100 mg total) by mouth daily. ?Patient taking differently: Take 100 mg by mouth every evening. 06/15/18   Purohit, Konrad Dolores, MD  ?triamcinolone lotion (KENALOG) 0.1 % Apply 1 application topically daily. 02/04/21   [provider]  ?                                                                                                                                  ?Past Surgical History ?Past Surgical History:  ?Procedure Laterality Date  ? BIOPSY  03/01/2020  ? Procedure: BIOPSY;  Surgeon: Ronnette Juniper, MD;  Location: [facility];  Service: Gastroenterology;;  ? CATARACT EXTRACTION Bilateral   ? ESOPHAGEAL MANOMETRY N/A 01/05/2020  ? Procedure: ESOPHAGEAL MANOMETRY (EM);  Surgeon: Ronnette Juniper, MD;  Location: [facility];  Service: Gastroenterology;  Laterality: N/A;  ? ESOPHAGOGASTRODUODENOSCOPY (EGD) WITH PROPOFOL N/A 06/11/2018  ? Procedure: ESOPHAGOGASTRODUODENOSCOPY (EGD) WITH PROPOFOL;  Surgeon: Clarene Essex, MD;  Location: [facility];  Service: Endoscopy;  Laterality: N/A;  ? ESOPHAGOGASTRODUODENOSCOPY (EGD) WITH PROPOFOL N/A 03/01/2020  ? Procedure: ESOPHAGOGASTRODUODENOSCOPY (EGD) WITH PROPOFOL With Botox injections;  Surgeon: Ronnette Juniper, MD;  Location: [facility];  Service: Gastroenterology;  Laterality: N/A;  ? KNEE SURGERY Left   ? LAPAROSCOPIC CHOLECYSTECTOMY SINGLE SITE WITH INTRAOPERATIVE CHOLANGIOGRAM N/A 01/28/2021  ? Procedure:  Laparscopic Cholecystectomy;  Drainage of empyema of gallbladder; Oversew of cystic duct with omentopexy;  Surgeon: Michael Boston, MD;  Location: [facility];  Service: General;  Laterality: N/A;  ? skin cancer resection    ? basal cell, Farrell carcinoma  ? SUBMUCOSAL INJECTION  03/01/2020  ? Procedure: SUBMUCOSAL INJECTION;  Surgeon: Ronnette Juniper, MD;  Location: [facility];  Service: Gastroenterology;;  ? TONSILLECTOMY    ? VASECTOMY    ? ?  Family History ?Family History  ?Problem Relation Age of Onset  ? Cancer Mother   ?     Breast cancer  ? Heart attack Father 62  ? CAD Father   ? Heart disease Brother   ? Lupus Daughter   ? ? ?Social History ?Social History  ? ?Tobacco Use  ? Smoking status: Former  ?  Types: Cigarettes  ?  Quit date: 08/18/1984  ?  Years since quitting: 36.8  ? Smokeless tobacco: Never  ?Vaping Use  ? Vaping Use: Never used  ?Substance Use Topics  ? Alcohol use: No  ? Drug use: No  ? ?Allergies ?Doxazosin mesylate, Lisinopril, Macrolides and ketolides, and Codeine ? ?Review of Systems ?Review of Systems  ?Gastrointestinal:  Positive for abdominal pain.  ? ?Physical Exam ?Vital Signs  ?I have reviewed the triage vital signs ?BP (!) 164/66   Pulse 65   Temp 98.6 ?F (37 ?C) (Oral)   Resp 16   Ht '5\' 10"'$  (1.778 m)   Wt 79.4 kg   SpO2 93%   BMI 25.11 kg/m?  ? ?Physical Exam ?Vitals and nursing note reviewed.  ?Constitutional:   ?   General: He is not in acute distress. ?   Appearance: He is well-developed.  ?HENT:  ?   Head: Normocephalic and atraumatic.  ?Eyes:  ?   Conjunctiva/sclera: Conjunctivae normal.  ?Cardiovascular:  ?   Rate and Rhythm: Normal rate and regular rhythm.  ?   Heart sounds: No murmur heard. ?Pulmonary:  ?   Effort: Pulmonary effort is normal. No respiratory distress.  ?   Breath sounds: Normal breath sounds.  ?Abdominal:  ?   Palpations: Abdomen is soft.  ?   Tenderness: There is abdominal tenderness in the right upper quadrant and epigastric area.  ?Musculoskeletal:     ?    General: No swelling.  ?   Cervical back: Neck supple.  ?Skin: ?   General: Skin is warm and dry.  ?   Capillary Refill: Capillary refill takes less than 2 seconds.  ?Neurological:  ?   Mental Status: He is alert.  ?P

## 2021-06-02 NOTE — ED Triage Notes (Signed)
Patient seen by PCP today and had labs done, recommended to come to ED due to suspicion of pancreatitis. Denies N/V/D, endorses upper abdominal pain. Hx pancreatitis  ?

## 2021-06-02 NOTE — H&P (Signed)
? ? ?Timothy Khan YYQ:[id number] DOB: 11/05/1931 DOA: 06/02/2021 ? ? ?  ?PCP: [name], MD   ?Outpatient Specialists:   ?CARDS:  Dr. Marlou Porch ?  ?GI  Dr.  Sadie Haber ) ? Therisa Doyne ?  ? ?Patient arrived to ER on 06/02/21 at 1922 ?Referred by Attending Kommor, Debe Coder, MD ? ? ?Patient coming from:   ? home Lives  With family ?  ? ?Chief Complaint:   ?Chief Complaint  ?Patient presents with  ? Abdominal Pain  ? ? ?HPI: ?Timothy Khan is a 88 y.o. male with medical history significant of CKD, dCHF, GERD, gallstone pancreatitis, HLD, HTN, OSA hx of SDH ? Sp cholecystectomy, BPH ? ?Presented with   abdominal pain ?Apparently his primary care provider did some labs and was concerning for patient having pancreatitis he has not had any nausea vomiting or diarrhea but has been having epigastric pain and history of pancreatitis in the past ? Started to have postprandial pain, would get better with rest for the past few days ? Deos not smoke or drink ?Has been having some memory issues ? ? Hx of pancreatitis:  cholecystectomy 01/28/2021, JP drain removed on 02/08/2021. Came in in December 2022 with elevated LFTs CT showed pancreatitis MRCP was done showed potential small stone within the common bile duct was seen by GI and ID was bacteremic and concerns for ascending cholangitis was empirically treated with IV Zosyn and Zyvox blood cultures grew Enterococcus echogram was negative for endocarditis eventually his Zosyn was discontinued and he was able to go home on Zyvox completed 13 days ? ?Has home health nurse that comes 3-5 times a weak ? ?Lab Results  ?Component Value Date  ? [place] NEGATIVE 03/12/2021  ? [place] NEGATIVE 01/25/2021  ? [place] NEGATIVE 07/25/2020  ? [place] NEGATIVE 02/26/2020  ? ?  ?Regarding pertinent Chronic problems:   ?  ? HTN on Norvasc ? ? chronic CHF diastolic  - last echo December 2022 showing EF 60-65% and grade 1 diastolic dysfunction ?  ?  ?  OSA - noncompliant with  CPAP ?  ? ? CKD stage IIIa- baseline Cr 1.4 ?Estimated Creatinine Clearance: 37.3 mL/min (A) (by C-G formula based on SCr of 1.44 mg/dL (H)). ? ?Lab Results  ?Component Value Date  ? CREATININE 1.44 (H) 06/02/2021  ? CREATININE 1.15 03/15/2021  ? CREATININE 1.37 (H) 03/14/2021  ? ? BPH -   ?    Chronic anemia - baseline hg Hemoglobin & Hematocrit  ?Recent Labs  ?  03/13/21 ?8889 03/15/21 ?0510 06/02/21 ?1940  ?HGB 12.7* 12.8* 14.1  ?  ?While in ER: ?  ?Came in noted to have elevated LFTs and lipase abdominal pain only associated with eating ?Ultrasound showed hepatic steatosis but otherwise unremarkable ?[facility] was consulted who recommended admission for MRCP and they will see in the morning ? ?Ordered ? US IMPRESSION: ?1. Evidence of prior cholecystectomy. ?2. Hepatic steatosis with multiple simple hepatic cysts. ? ___________________________ ?ER Provider Called:   [facility]  Dr.Outlaw ?They Recommend admit to medicine   ?Will see in AM    ?  ?ED Triage Vitals  ?Enc Vitals Group  ?   BP 06/02/21 1929 (!) 146/68  ?   Pulse Rate 06/02/21 1929 86  ?   Resp 06/02/21 1929 16  ?   Temp 06/02/21 1929 98.6 ?F (37 ?C)  ?   Temp Source 06/02/21 1929 Oral  ?   SpO2 06/02/21 1929 96 %  ?  Weight 06/02/21 1929 175 lb (79.4 kg)  ?   Height 06/02/21 1929 '5\' 10"'$  (1.778 m)  ?   Head Circumference --   ?   Peak Flow --   ?   Pain Score 06/02/21 1939 7  ?   Pain Loc --   ?   Pain Edu? --   ?   Excl. in [place]? --   ?BEML(54)@    ? _________________________________________ ?Significant initial  Findings: ?Abnormal Labs Reviewed  ?LIPASE, BLOOD - Abnormal; Notable for the following components:  ?    Result Value  ? Lipase 349 (*)   ? All other components within normal limits  ?COMPREHENSIVE METABOLIC PANEL - Abnormal; Notable for the following components:  ? Potassium 5.3 (*)   ? Glucose, Bld 105 (*)   ? BUN 24 (*)   ? Creatinine, Ser 1.44 (*)   ? Calcium 8.4 (*)   ? AST 384 (*)   ? ALT 539 (*)   ? Alkaline Phosphatase 188 (*)   ? Total  Bilirubin 2.2 (*)   ? GFR, Estimated 47 (*)   ? All other components within normal limits  ?CBC - Abnormal; Notable for the following components:  ? WBC 10.9 (*)   ? All other components within normal limits  ?URINALYSIS, ROUTINE W REFLEX MICROSCOPIC - Abnormal; Notable for the following components:  ? Color, Urine AMBER (*)   ? Protein, ur 100 (*)   ? Leukocytes,Ua TRACE (*)   ? All other components within normal limits  ?  ?ECG: Ordered ?Personally reviewed by me showing: ?HR : 84 ?Rhythm:   ?Sinus rhythm ?Probable left atrial enlargement ?Borderline T wave abnormalities ?QTC 409 ?  ? ?The recent clinical data is shown below. ?Vitals:  ? 06/02/21 2100 06/02/21 2115 06/02/21 2145 06/02/21 2245  ?BP: 140/82 (!) 146/71 (!) 164/66 (!) 168/78  ?Pulse: 80 79 65 78  ?Resp:   16 16  ?Temp:      ?TempSrc:      ?SpO2: 95% 96% 93% 95%  ?Weight:      ?Height:      ?  ?  ?WBC ? ?   ?Component Value Date/Time  ? WBC 10.9 (H) 06/02/2021 1940  ? LYMPHSABS 1.5 03/15/2021 0510  ? MONOABS 0.9 03/15/2021 0510  ? EOSABS 0.5 03/15/2021 0510  ? BASOSABS 0.1 03/15/2021 0510  ?  ? ? UA   no evidence of UTI    ?  ?Urine analysis: ?   ?Component Value Date/Time  ? COLORURINE AMBER (A) 06/02/2021 1940  ? APPEARANCEUR CLEAR 06/02/2021 1940  ? LABSPEC 1.020 06/02/2021 1940  ? PHURINE 6.0 06/02/2021 1940  ? [place] NEGATIVE 06/02/2021 1940  ? [place] NEGATIVE 06/02/2021 1940  ? [place] NEGATIVE 06/02/2021 1940  ? [place] Stain NEGATIVE 06/02/2021 1940  ? PROTEINUR 100 (A) 06/02/2021 1940  ? NITRITE NEGATIVE 06/02/2021 1940  ? LEUKOCYTESUR TRACE (A) 06/02/2021 1940  ? ? ?Results for orders placed or performed during the hospital encounter of 03/12/21  ?Blood Culture (routine x 2)     Status: Abnormal  ? Collection Time: 03/12/21 12:41 AM  ? Specimen: BLOOD  ?Result Value Ref Range Status  ? Specimen Description   Final  ?  BLOOD RIGHT ANTECUBITAL ?Performed at [facility], [place] [address] [place], [place] [address] ?  ?  Special Requests   Final  ?  BOTTLES DRAWN AEROBIC AND ANAEROBIC Blood Culture results may not be optimal due to an excessive volume of  blood received in culture bottles ?Performed at [facility], [place] [address] [place], [place] [address] ?  ? Culture  Setup Time   Final  ?  GRAM POSITIVE COCCI IN PAIRS AND CHAINS ?IN BOTH AEROBIC AND ANAEROBIC BOTTLES ?CRITICAL RESULT CALLED TO, READ BACK BY AND VERIFIED WITH: L POINDEXTER,PHARMD'@2358'$  03/12/21 [place] ?Performed at [facility], [place] [address] [place], [place] [address] ?  ? Culture ENTEROCOCCUS FAECIUM (A)  Final  ? Report Status 03/14/2021 FINAL  Final  ? Organism ID, Bacteria ENTEROCOCCUS FAECIUM  Final  ?    Susceptibility  ? Enterococcus faecium - MIC*  ?  AMPICILLIN RESISTANT Resistant   ?  VANCOMYCIN <=0.5 SENSITIVE Sensitive   ?  GENTAMICIN SYNERGY SENSITIVE Sensitive   ?  * ENTEROCOCCUS FAECIUM  ?Urine Culture     Status: None  ? Collection Time: 03/12/21 12:41 AM  ? Specimen: In/Out Cath Urine  ?Result Value Ref Range Status  ? Specimen Description   Final  ?  IN/OUT CATH URINE ?Performed at [facility], [place] [address], [place], [place] [address] ?  ? Special Requests   Final  ?  NONE ?Performed at [facility], [place] [address] [place], [place] [address] ?  ? Culture   Final  ?  NO GROWTH ?Performed at [facility], [place] [address] [place], [place] [address] ?  ? Report Status 03/13/2021 FINAL  Final  ?Resp Panel by RT-PCR (Flu A&B, Covid) Nasopharyngeal Swab     Status: None  ? Collection Time: 03/12/21 12:41 AM  ? Specimen: Nasopharyngeal Swab; Nasopharyngeal(NP) swabs in vial transport medium  ?Result Value Ref Range Status  ? SARS Coronavirus 2 by RT PCR NEGATIVE NEGATIVE Final  ?      ? Influenza A by PCR NEGATIVE NEGATIVE Final  ? Influenza B by PCR NEGATIVE NEGATIVE Final  ?      ?Blood Culture ID Panel (Reflexed)     Status: Abnormal  ? Collection Time: 03/12/21 12:41 AM  ?Result  Value Ref Range Status  ? Enterococcus faecalis NOT DETECTED NOT DETECTED Final  ? Enterococcus Faecium DETECTED (A) NOT DETECTED Final  ?  Comment: CRITICAL RESULT CALLED TO, READ BACK BY AND VERIFIED WITH: ?L

## 2021-06-02 NOTE — ED Notes (Signed)
Patient transported to Ultrasound 

## 2021-06-02 NOTE — Subjective & Objective (Signed)
Apparently his primary care provider did some labs and was concerning for patient having pancreatitis he has not had any nausea vomiting or diarrhea but has been having epigastric pain and history of pancreatitis in the past ?

## 2021-06-03 DIAGNOSIS — I5032 Chronic diastolic (congestive) heart failure: Secondary | ICD-10-CM | POA: Diagnosis present

## 2021-06-03 DIAGNOSIS — E875 Hyperkalemia: Secondary | ICD-10-CM | POA: Diagnosis present

## 2021-06-03 DIAGNOSIS — R7401 Elevation of levels of liver transaminase levels: Secondary | ICD-10-CM

## 2021-06-03 DIAGNOSIS — N179 Acute kidney failure, unspecified: Secondary | ICD-10-CM

## 2021-06-03 DIAGNOSIS — R748 Abnormal levels of other serum enzymes: Secondary | ICD-10-CM

## 2021-06-03 LAB — COMPREHENSIVE METABOLIC PANEL
ALT: 389 U/L — ABNORMAL HIGH (ref 0–44)
AST: 195 U/L — ABNORMAL HIGH (ref 15–41)
Albumin: 3.8 g/dL (ref 3.5–5.0)
Alkaline Phosphatase: 172 U/L — ABNORMAL HIGH (ref 38–126)
Anion gap: 8 (ref 5–15)
BUN: 19 mg/dL (ref 8–23)
CO2: 26 mmol/L (ref 22–32)
Calcium: 8.4 mg/dL — ABNORMAL LOW (ref 8.9–10.3)
Chloride: 102 mmol/L (ref 98–111)
Creatinine, Ser: 1.15 mg/dL (ref 0.61–1.24)
GFR, Estimated: >60 mL/min (ref >60)
Glucose, Bld: 94 mg/dL (ref 70–99)
Potassium: 4.3 mmol/L (ref 3.5–5.1)
Sodium: 136 mmol/L (ref 135–145)
Total Bilirubin: 1.6 mg/dL — ABNORMAL HIGH (ref 0.3–1.2)
Total Protein: 7.3 g/dL (ref 6.5–8.1)

## 2021-06-03 LAB — DIFFERENTIAL
Abs Immature Granulocytes: 0.03 10*3/uL (ref 0.00–0.07)
Basophils Absolute: 0 10*3/uL (ref 0.0–0.1)
Basophils Relative: 0 %
Eosinophils Absolute: 0.1 10*3/uL (ref 0.0–0.5)
Eosinophils Relative: 1 %
Immature Granulocytes: 0 %
Lymphocytes Relative: 13 %
Lymphs Abs: 1.4 10*3/uL (ref 0.7–4.0)
Monocytes Absolute: 0.9 10*3/uL (ref 0.1–1.0)
Monocytes Relative: 8 %
Neutro Abs: 8.2 10*3/uL — ABNORMAL HIGH (ref 1.7–7.7)
Neutrophils Relative %: 78 %

## 2021-06-03 LAB — AMMONIA: Ammonia: 17 umol/L (ref 9–35)

## 2021-06-03 LAB — MAGNESIUM
Magnesium: 2.3 mg/dL (ref 1.7–2.4)
Magnesium: 2.2 mg/dL (ref 1.7–2.4)

## 2021-06-03 LAB — CBC WITH DIFFERENTIAL/PLATELET
Abs Immature Granulocytes: 0.04 10*3/uL (ref 0.00–0.07)
Basophils Absolute: 0 10*3/uL (ref 0.0–0.1)
Basophils Relative: 0 %
Eosinophils Absolute: 0.1 10*3/uL (ref 0.0–0.5)
Eosinophils Relative: 1 %
HCT: 44.3 % (ref 39.0–52.0)
Hemoglobin: 14.4 g/dL (ref 13.0–17.0)
Immature Granulocytes: 0 %
Lymphocytes Relative: 16 %
Lymphs Abs: 1.6 10*3/uL (ref 0.7–4.0)
MCH: 28.9 pg (ref 26.0–34.0)
MCHC: 32.5 g/dL (ref 30.0–36.0)
MCV: 88.8 fL (ref 80.0–100.0)
Monocytes Absolute: 1 10*3/uL (ref 0.1–1.0)
Monocytes Relative: 10 %
Neutro Abs: 7.1 10*3/uL (ref 1.7–7.7)
Neutrophils Relative %: 73 %
Platelets: 216 10*3/uL (ref 150–400)
RBC: 4.99 MIL/uL (ref 4.22–5.81)
RDW: 13.7 % (ref 11.5–15.5)
WBC: 10 10*3/uL (ref 4.0–10.5)
nRBC: 0 % (ref 0.0–0.2)

## 2021-06-03 LAB — RESP PANEL BY RT-PCR (FLU A&B, COVID) ARPGX2
Influenza A by PCR: NEGATIVE
Influenza B by PCR: NEGATIVE
SARS Coronavirus 2 by RT PCR: NEGATIVE

## 2021-06-03 LAB — HEPATIC FUNCTION PANEL
ALT: 552 U/L — ABNORMAL HIGH (ref 0–44)
AST: 373 U/L — ABNORMAL HIGH (ref 15–41)
Albumin: 4.1 g/dL (ref 3.5–5.0)
Alkaline Phosphatase: 188 U/L — ABNORMAL HIGH (ref 38–126)
Bilirubin, Direct: 0.8 mg/dL — ABNORMAL HIGH (ref 0.0–0.2)
Indirect Bilirubin: 1.6 mg/dL — ABNORMAL HIGH (ref 0.3–0.9)
Total Bilirubin: 2.4 mg/dL — ABNORMAL HIGH (ref 0.3–1.2)
Total Protein: 7.9 g/dL (ref 6.5–8.1)

## 2021-06-03 LAB — VITAMIN B12: Vitamin B-12: 775 pg/mL (ref 180–914)

## 2021-06-03 LAB — CK: Total CK: 150 U/L (ref 49–397)

## 2021-06-03 LAB — PHOSPHORUS
Phosphorus: 4 mg/dL (ref 2.5–4.6)
Phosphorus: 3 mg/dL (ref 2.5–4.6)

## 2021-06-03 LAB — TSH: TSH: 0.547 u[IU]/mL (ref 0.350–4.500)

## 2021-06-03 LAB — LIPASE, BLOOD: Lipase: 86 U/L — ABNORMAL HIGH (ref 11–51)

## 2021-06-03 MED ORDER — SODIUM CHLORIDE 0.9 % IV SOLN
75.0000 mL/h | INTRAVENOUS | Status: DC
  Administered 2021-06-03: 75 mL/h via INTRAVENOUS

## 2021-06-03 MED ORDER — HYDROCODONE-ACETAMINOPHEN 5-325 MG PO TABS
1.0000 | ORAL_TABLET | ORAL | Status: DC | PRN
  Filled 2021-06-03: qty 2

## 2021-06-03 MED ORDER — DARIFENACIN HYDROBROMIDE ER 7.5 MG PO TB24
7.5000 mg | ORAL_TABLET | Freq: Every day | ORAL | Status: DC
  Administered 2021-06-03 – 2021-06-06 (×4): 7.5 mg via ORAL
  Filled 2021-06-03 (×5): qty 1

## 2021-06-03 MED ORDER — GABAPENTIN 300 MG PO CAPS
600.0000 mg | ORAL_CAPSULE | Freq: Every day | ORAL | Status: DC
  Administered 2021-06-03: 600 mg via ORAL
  Filled 2021-06-03: qty 2

## 2021-06-03 MED ORDER — PANTOPRAZOLE SODIUM 40 MG PO TBEC
40.0000 mg | DELAYED_RELEASE_TABLET | Freq: Every day | ORAL | Status: DC
  Administered 2021-06-03 – 2021-06-06 (×4): 40 mg via ORAL
  Filled 2021-06-03 (×2): qty 1

## 2021-06-03 MED ORDER — LORATADINE 10 MG PO TABS
10.0000 mg | ORAL_TABLET | Freq: Every day | ORAL | Status: DC
  Administered 2021-06-03 – 2021-06-06 (×4): 10 mg via ORAL
  Filled 2021-06-03 (×4): qty 1

## 2021-06-03 MED ORDER — TRAMADOL HCL 50 MG PO TABS
50.0000 mg | ORAL_TABLET | Freq: Once | ORAL | Status: AC
  Administered 2021-06-03: 50 mg via ORAL
  Filled 2021-06-03: qty 1

## 2021-06-03 MED ORDER — GABAPENTIN 300 MG PO CAPS
300.0000 mg | ORAL_CAPSULE | Freq: Every day | ORAL | Status: DC
  Administered 2021-06-03 – 2021-06-05 (×3): 300 mg via ORAL
  Filled 2021-06-03 (×3): qty 1

## 2021-06-03 MED ORDER — ACETAMINOPHEN 650 MG RE SUPP: 650.0000 mg | Freq: Four times a day (QID) | RECTAL | Status: DC | PRN

## 2021-06-03 MED ORDER — GADOBUTROL 1 MMOL/ML IV SOLN
7.5000 mL | Freq: Once | INTRAVENOUS | Status: AC | PRN
  Administered 2021-06-03: 7.5 mL via INTRAVENOUS

## 2021-06-03 MED ORDER — ACETAMINOPHEN 325 MG PO TABS
650.0000 mg | ORAL_TABLET | Freq: Four times a day (QID) | ORAL | Status: DC | PRN
  Administered 2021-06-06: 650 mg via ORAL
  Filled 2021-06-03: qty 2

## 2021-06-03 MED ORDER — AMLODIPINE BESYLATE 5 MG PO TABS
5.0000 mg | ORAL_TABLET | Freq: Every day | ORAL | Status: DC
Start: 2021-06-03 — End: 2021-06-06
  Administered 2021-06-03 – 2021-06-05 (×4): 5 mg via ORAL
  Filled 2021-06-03 (×4): qty 1

## 2021-06-03 MED ORDER — TAMSULOSIN HCL 0.4 MG PO CAPS
0.4000 mg | ORAL_CAPSULE | Freq: Every day | ORAL | Status: DC
  Administered 2021-06-03 – 2021-06-05 (×4): 0.4 mg via ORAL
  Filled 2021-06-03 (×4): qty 1

## 2021-06-03 NOTE — Assessment & Plan Note (Signed)
Resume CPAP when able pt does not use on regular basis ?Would complete abd work up first ?

## 2021-06-03 NOTE — Assessment & Plan Note (Addendum)
Recent Labs  ?  01/30/21 ?4383 02/17/21 ?2158 03/12/21 ?0041 03/13/21 ?8184 03/14/21 ?0375 03/15/21 ?0510 06/02/21 ?1940 06/03/21 ?4360 06/04/21 ?0518 06/05/21 ?0543  ?BUN 15 18 24* '16 19 15 '$ 24* '19 20 18  '$ ?CREATININE 1.33* 1.53* 1.28* 1.18 1.37* 1.15 1.44* 1.15 1.22 1.02  ?AKI resolved. ?-Recheck in 1 to 2 weeks ? ?

## 2021-06-03 NOTE — Assessment & Plan Note (Signed)
-  Continue home amlodipine 

## 2021-06-03 NOTE — Progress Notes (Signed)
?PROGRESS NOTE ? ?Timothy Khan [id number]:[id number] DOB: 06/11/1931  ? ?PCP: [name], MD ? ?Patient is from: Home.  Lives with his wife.  Uses cane at baseline. ? ?DOA: 06/02/2021 LOS: 1 ? ?Chief complaints ?Chief Complaint  ?Patient presents with  ? Abdominal Pain  ?  ? ?Brief Narrative / Interim history: ?88 year old M with PMH of gallstone pancreatitis, ascending cholangitis/Enterococcus bacteremia, cholecystectomy, IBB-0W, diastolic CHF, OSA not on CPAP, BPH, GERD and HLD directed to ED by PCP with concern for pancreatitis.  Patient had epigastric abdominal pain and had blood work at PCP office which was reportedly concerning for pancreatitis.  He had elevated liver enzymes, hyperbilirubinemia and elevated lipase in ED.  RUQ [name] with hepatic steatosis with multiple simple hepatic cysts.  [facility] consulted and recommended MRCP, which was basically negative. ? ?Liver enzymes improved.  GI advanced diet to soft.   ? ?Subjective: ?Seen and examined earlier this morning.  No major events overnight of this morning.  No complaints.  He denies pain, nausea, vomiting, chest pain, dyspnea, fever or UTI symptoms.  He is asking when he could eat. ? ?Objective: ?Vitals:  ? 06/02/21 2145 06/02/21 2245 06/03/21 0118 06/03/21 0415  ?BP: (!) 164/66 (!) 168/78 (!) 154/79 (!) 147/74  ?Pulse: 65 78 89 91  ?Resp: '16 16 18 18  '$ ?Temp:   98.4 ?F (36.9 ?C) 99.2 ?F (37.3 ?C)  ?TempSrc:   Oral Oral  ?SpO2: 93% 95% 97% 92%  ?Weight:    78.5 kg  ?Height:      ? ? ?Examination: ? ?GENERAL: No apparent distress.  Nontoxic. ?HEENT: MMM.  Vision and hearing grossly intact.  ?NECK: Supple.  No apparent JVD.  ?RESP:  No IWOB.  Fair aeration bilaterally. ?CVS:  RRR. Heart sounds normal.  ?ABD/GI/GU: BS+. Abd soft, NTND.  ?MSK/EXT:  Moves extremities. No apparent deformity. No edema.  ?SKIN: no apparent skin lesion or wound ?NEURO: Awake, alert and oriented appropriately.  No apparent focal neuro deficit.  Seems to have some starter ?PSYCH:  Calm. Normal affect.  ? ?Procedures:  ?None ? ?Microbiology summarized: ?COVID-19 and influenza PCR nonreactive. ? ?Assessment and Plan: ?* Pancreatitis ?In patient with history of gallstone pancreatitis and cholecystectomy.  Presented to PCP with epigastric abdominal pain.  Lipase and liver enzymes elevated but no radiologic evidence.  MRCP basically negative.  Suspected to be due to biliary sludge.  Pain resolved.  Liver enzymes improved.  ?-[facility] following-advance diet to soft ?-Monitor liver enzymes or lipase ? ?Transaminitis ?Recent Labs  ?Lab 06/02/21 ?1940 06/03/21 ?8889  ?AST 373*  384* 195*  ?ALT 552*  539* 389*  ?ALKPHOS 188*  188* 172*  ?BILITOT 2.4*  2.2* 1.6*  ?PROT 7.9  8.1 7.3  ?ALBUMIN 4.1  4.2 3.8  ?Felt to be due to passage of sludge through CBD.  MRCP basically negative.  Improving. ?-Recheck in the morning ? ? ?Elevated alkaline phosphatase level ?Severe transaminitis ? ?Hyperbilirubinemia ?Severe transaminitis ? ?Acute renal failure superimposed on stage 3a chronic kidney disease ([place]) ?Recent Labs  ?  01/28/21 ?0452 01/29/21 ?1694 01/30/21 ?5038 02/17/21 ?2158 03/12/21 ?0041 03/13/21 ?8828 03/14/21 ?0034 03/15/21 ?0510 06/02/21 ?1940 06/03/21 ?9179  ?BUN '15 17 15 18 '$ 24* '16 19 15 '$ 24* 19  ?CREATININE 1.07 1.23 1.33* 1.53* 1.28* 1.18 1.37* 1.15 1.44* 1.15  ?AKI resolved. ?-Avoid nephrotoxic meds ?-Monitor ? ? ?Chronic diastolic CHF (congestive heart failure) ([place]) ?Appears euvolemic.  Not on diuretics at home. ? ?Hyperkalemia ?Resolved ? ?OSA (obstructive  sleep apnea) ?Not compliant with CPAP. ? ?BPH (benign prostatic hyperplasia) ?Continue home Flomax.  Vesicare is on his home med list but not sure if he should be on this ? ?Hypertension ?Continue home amlodipine. ? ? ? ? ?  ? ?Body mass index is 24.83 kg/m?. ?  ?  ?  ?  ?DVT prophylaxis:  ?SCDs Start: 06/03/21 0114 ? ?Code Status: Full code ?Family Communication: Patient and/or RN. Available if any question.  ?Level of care:  Telemetry ?Status is: Inpatient ?Remains inpatient appropriate because: Acute pancreatitis and elevated liver enzymes ? ? ?Final disposition: TBD after PT/OT evaluation ? ?Consultants:  ?Gastroenterology ? ?Sch Meds:  ?Scheduled Meds: ? amLODipine  5 mg Oral QHS  ? darifenacin  7.5 mg Oral Daily  ? gabapentin  600 mg Oral QHS  ? pantoprazole  40 mg Oral Daily  ? tamsulosin  0.4 mg Oral QHS  ? ?Continuous Infusions: ? sodium chloride 75 mL/hr (06/03/21 0323)  ? sodium chloride    ? ?PRN Meds:.sodium chloride, acetaminophen **OR** acetaminophen, HYDROcodone-acetaminophen ? ?Antimicrobials: ?Anti-infectives (From admission, onward)  ? ? None  ? ?  ? ? ? ?I have personally reviewed the following labs and images: ?CBC: ?Recent Labs  ?Lab 06/02/21 ?1940 06/03/21 ?9381  ?WBC 10.9* 10.0  ?NEUTROABS 8.2* 7.1  ?HGB 14.1 14.4  ?HCT 42.9 44.3  ?MCV 89.2 88.8  ?PLT 268 216  ? ?BMP &GFR ?Recent Labs  ?Lab 06/02/21 ?1940 06/03/21 ?8299  ?NA 136 136  ?K 5.3* 4.3  ?CL 103 102  ?CO2 26 26  ?GLUCOSE 105* 94  ?BUN 24* 19  ?CREATININE 1.44* 1.15  ?CALCIUM 8.4* 8.4*  ?MG 2.3 2.2  ?PHOS 4.0 3.0  ? ?Estimated Creatinine Clearance: 46.7 mL/min (by C-G formula based on SCr of 1.15 mg/dL). ?Liver & Pancreas: ?Recent Labs  ?Lab 06/02/21 ?1940 06/03/21 ?3716  ?AST 373*  384* 195*  ?ALT 552*  539* 389*  ?ALKPHOS 188*  188* 172*  ?BILITOT 2.4*  2.2* 1.6*  ?PROT 7.9  8.1 7.3  ?ALBUMIN 4.1  4.2 3.8  ? ?Recent Labs  ?Lab 06/02/21 ?1940  ?LIPASE 349*  ? ?No results for input(s): AMMONIA in the last 168 hours. ?Diabetic: ?No results for input(s): HGBA1C in the last 72 hours. ?No results for input(s): GLUCAP in the last 168 hours. ?Cardiac Enzymes: ?Recent Labs  ?Lab 06/02/21 ?1940  ?CKTOTAL 150  ? ?No results for input(s): PROBNP in the last 8760 hours. ?Coagulation Profile: ?No results for input(s): INR, PROTIME in the last 168 hours. ?Thyroid Function Tests: ?Recent Labs  ?  06/03/21 ?0711  ?TSH 0.547  ? ?Lipid Profile: ?No results for  input(s): CHOL, HDL, LDLCALC, TRIG, CHOLHDL, LDLDIRECT in the last 72 hours. ?Anemia Panel: ?No results for input(s): VITAMINB12, FOLATE, FERRITIN, TIBC, IRON, RETICCTPCT in the last 72 hours. ?Urine analysis: ?   ?Component Value Date/Time  ? COLORURINE AMBER (A) 06/02/2021 1940  ? APPEARANCEUR CLEAR 06/02/2021 1940  ? LABSPEC 1.020 06/02/2021 1940  ? PHURINE 6.0 06/02/2021 1940  ? [place] NEGATIVE 06/02/2021 1940  ? [place] NEGATIVE 06/02/2021 1940  ? [place] NEGATIVE 06/02/2021 1940  ? [place] Stain NEGATIVE 06/02/2021 1940  ? PROTEINUR 100 (A) 06/02/2021 1940  ? NITRITE NEGATIVE 06/02/2021 1940  ? LEUKOCYTESUR TRACE (A) 06/02/2021 1940  ? ?Sepsis Labs: ?Invalid input(s): PROCALCITONIN, LACTICIDVEN ? ?Microbiology: ?Recent Results (from the past 240 hour(s))  ?Resp Panel by RT-PCR (Flu A&B, Covid) Nasopharyngeal Swab     Status: None  ? Collection Time: 06/03/21 12:58  AM  ? Specimen: Nasopharyngeal Swab; Nasopharyngeal(NP) swabs in vial transport medium  ?Result Value Ref Range Status  ? SARS Coronavirus 2 by RT PCR NEGATIVE NEGATIVE Final  ?  Comment: (NOTE) ?SARS-CoV-2 target nucleic acids are NOT DETECTED. ? ?The SARS-CoV-2 RNA is generally detectable in upper respiratory ?specimens during the acute phase of infection. The lowest ?concentration of SARS-CoV-2 viral copies this assay can detect is ?138 copies/mL. A negative result does not preclude SARS-Cov-2 ?infection and should not be used as the sole basis for treatment or ?other patient management decisions. A negative result may occur with  ?improper specimen collection/handling, submission of specimen other ?than nasopharyngeal swab, presence of viral mutation(s) within the ?areas targeted by this assay, and inadequate number of viral ?copies(<138 copies/mL). A negative result must be combined with ?clinical observations, patient history, and epidemiological ?information. The expected result is Negative. ? ?Fact Sheet for Patients:   ?EntrepreneurPulse.com.au ? ?Fact Sheet for Healthcare Providers:  ?IncredibleEmployment.be ? ?This test is no t yet approved or cleared by the [place] FDA and  ?has been authorized for detection and/or diagnosis

## 2021-06-03 NOTE — Assessment & Plan Note (Addendum)
In patient with history of gallstone pancreatitis and cholecystectomy.  Presented to PCP with epigastric abdominal pain.  Lipase and liver enzymes elevated but no radiologic evidence.  MRCP basically negative.  Suspected to be due to biliary sludge.  Pain resolved.  Liver enzymes improved.  Lipase normalized.  Tolerated regular diet. ?-[facility] signed off and recommended outpatient follow-up ?-Recheck CMP in 1 to 2 weeks ?

## 2021-06-03 NOTE — Assessment & Plan Note (Addendum)
Improved.  Severe transaminitis ?

## 2021-06-03 NOTE — Assessment & Plan Note (Signed)
-  chronic avoid nephrotoxic medications such as NSAIDs, [place] Zosyn combo,  avoid hypotension, continue to follow renal function  

## 2021-06-03 NOTE — Assessment & Plan Note (Addendum)
Not compliant with CPAP.  Encouraged to use. ?

## 2021-06-03 NOTE — Assessment & Plan Note (Signed)
Stable - continue home meds 

## 2021-06-03 NOTE — Assessment & Plan Note (Signed)
Not on statin 

## 2021-06-03 NOTE — Assessment & Plan Note (Signed)
Resume norvasc if able to tolerate ?

## 2021-06-03 NOTE — Assessment & Plan Note (Signed)
most likely cause being gallstone induced  ?MRCP pending ?[facility] consulted ?Will rehydrate with aggressive IV fluids ?Keep n.p.o. ?Follow clinically  ?  ?-Check lipid panel ?  ?Control pain with IV pain medications ?  ? ?

## 2021-06-03 NOTE — Assessment & Plan Note (Addendum)
Recent Labs  ?Lab 06/02/21 ?1940 06/03/21 ?9914 06/04/21 ?0518 06/05/21 ?4458 06/06/21 ?4835  ?AST 373*  384* 195* 68* 30 27  ?ALT 552*  539* 389* 237* 149* 109*  ?ALKPHOS 188*  188* 172* 159* 128* 134*  ?BILITOT 2.4*  2.2* 1.6* 0.8 0.6 0.5  ?PROT 7.9  8.1 7.3 6.8 6.8 6.8  ?ALBUMIN 4.1  4.2 3.8 3.3* 3.2* 3.1*  ?Felt to be due to passage of sludge through CBD.  MRCP basically negative.  Improved. ?-Recheck CMP in 1 to 2 weeks ? ?

## 2021-06-03 NOTE — Consult Note (Signed)
Referring Provider:  TH ?Primary Care Physician:  [name], MD ?Primary Gastroenterologist:  [facility] ? ?Reason for Consultation: Abdominal pain ? ?HPI: Timothy Khan is a 88 y.o. male with past medical history of subdural hematoma, chronic kidney disease, CHF presented to the hospital with abdominal pain.  Initial blood work showed elevated lipase 349, elevated LFTs with AST 384, ALT 539, alk phos 188 and T. bili 2.2.  Mild elevated white count 10.9.  His lipase was elevated around 500 in December 2022.  GI is consulted for further evaluation. ? ?History of pancreatitis in November 2022.  Status postcholecystectomy.  CT scan on May 01, 2021 for possible CBD stones showed no evidence of choledocholithiasis.  Normal-appearing pancreas. ? ?Ultrasound yesterday showed multiple hepatic cyst.  CBD of 7.6 mm. ? ?MRI MRCP this morning showed no evidence of intra or extrahepatic biliary ductal dilation.  No CBD stone.  Normal-appearing pancreas.  Multiple hepatic cyst. ? ?Patient seen and examined at bedside.  His abdominal pain has resolved.  He is feeling hungry.  He denies any nausea or vomiting.  Denies diarrhea or constipation. ? ? ? ?Past Medical History:  ?Diagnosis Date  ? Abnormal brain MRI   ? Right side  ? Basal cell carcinoma of back   ? BPH (benign prostatic hyperplasia)   ? Chronic cough   ? CKD (chronic kidney disease), stage III ([place])   ? Concussion 1950  ? Baseball injury  ? Diastolic dysfunction   ? ED (erectile dysfunction)   ? Foot drop, bilateral 05/02/2013  ? Gait disorder   ? Gait disturbance   ? Gastroesophageal reflux disease   ? Hyperlipidemia   ? Hypertension   ? Libido, decreased   ? Lumbago   ? Memory loss   ? Onychomycosis   ? OSA (obstructive sleep apnea)   ? Peripheral neuropathy   ? PSA elevation   ? SDH (subdural hematoma) 04/30/2013  ? Skull fracture ([place])   ? Skull fracture ([place])   ? Sleep apnea   ? ? ?Past Surgical History:  ?Procedure Laterality Date  ? BIOPSY   03/01/2020  ? Procedure: BIOPSY;  Surgeon: Ronnette Juniper, MD;  Location: [facility];  Service: Gastroenterology;;  ? CATARACT EXTRACTION Bilateral   ? ESOPHAGEAL MANOMETRY N/A 01/05/2020  ? Procedure: ESOPHAGEAL MANOMETRY (EM);  Surgeon: Ronnette Juniper, MD;  Location: [facility];  Service: Gastroenterology;  Laterality: N/A;  ? ESOPHAGOGASTRODUODENOSCOPY (EGD) WITH PROPOFOL N/A 06/11/2018  ? Procedure: ESOPHAGOGASTRODUODENOSCOPY (EGD) WITH PROPOFOL;  Surgeon: Clarene Essex, MD;  Location: [facility];  Service: Endoscopy;  Laterality: N/A;  ? ESOPHAGOGASTRODUODENOSCOPY (EGD) WITH PROPOFOL N/A 03/01/2020  ? Procedure: ESOPHAGOGASTRODUODENOSCOPY (EGD) WITH PROPOFOL With Botox injections;  Surgeon: Ronnette Juniper, MD;  Location: [facility];  Service: Gastroenterology;  Laterality: N/A;  ? KNEE SURGERY Left   ? LAPAROSCOPIC CHOLECYSTECTOMY SINGLE SITE WITH INTRAOPERATIVE CHOLANGIOGRAM N/A 01/28/2021  ? Procedure: Laparscopic Cholecystectomy;  Drainage of empyema of gallbladder; Oversew of cystic duct with omentopexy;  Surgeon: Michael Boston, MD;  Location: [facility];  Service: General;  Laterality: N/A;  ? skin cancer resection    ? basal cell, [place] carcinoma  ? SUBMUCOSAL INJECTION  03/01/2020  ? Procedure: SUBMUCOSAL INJECTION;  Surgeon: Ronnette Juniper, MD;  Location: [facility];  Service: Gastroenterology;;  ? TONSILLECTOMY    ? VASECTOMY    ? ? ?Prior to Admission medications   ?Medication Sig Start Date End Date Taking? Authorizing Provider  ?acetaminophen (TYLENOL) 325 MG tablet Take 2 tablets (650 mg  total) by mouth every 6 (six) hours as needed for mild pain (or Fever >/= 101). 03/15/21  Yes Eugenie Filler, MD  ?amLODipine (NORVASC) 5 MG tablet Take 5 mg by mouth at bedtime. 06/24/18  Yes [provider]  ?cetirizine (ZYRTEC) 10 MG tablet Take 10 mg by mouth at bedtime.    Yes [provider]  ?Cholecalciferol (VITAMIN D3) 2000 UNITS capsule Take 2,000 Units by mouth daily.   Yes [provider]  ?Coenzyme Q10 (CO Q-10) 300 MG CAPS Take 300 mg by mouth daily.   Yes [provider]  ?Cyanocobalamin (B-12) 5000 MCG CAPS Take 5,000 mcg by mouth daily.   Yes [provider]  ?esomeprazole (NEXIUM) 20 MG capsule Take 20 mg by mouth daily.    Yes [provider]  ?fluocinonide (LIDEX) 0.05 % external solution Apply 1 application topically daily. 20 drops or 1 ml on the scalp 02/13/20  Yes [provider]  ?fluticasone (FLONASE) 50 MCG/ACT nasal spray Place 2 sprays into both nostrils at bedtime.  04/16/13  Yes [provider]  ?gabapentin (NEURONTIN) 300 MG capsule Take 2 capsules (600 mg total) by mouth at bedtime. ?Patient taking differently: Take 300 mg by mouth at bedtime. 12/04/17  Yes Kathrynn Ducking, MD  ?mometasone (ELOCON) 0.1 % cream Apply 1 application. topically 2 (two) times daily. 05/16/21  Yes [provider]  ?ondansetron (ZOFRAN) 4 MG tablet Take 4 mg by mouth 3 (three) times daily as needed for nausea or vomiting. 01/05/20  Yes [provider]  ?saccharomyces boulardii (FLORASTOR) 250 MG capsule Take 250 mg by mouth 2 (two) times daily.   Yes [provider]  ?senna-docusate (SENOKOT-S) 8.6-50 MG tablet Take 1 tablet by mouth daily as needed for mild constipation.   Yes [provider]  ?solifenacin (VESICARE) 5 MG tablet Take 5 mg by mouth every morning. 03/02/21  Yes [provider]  ?tamsulosin (FLOMAX) 0.4 MG CAPS Take 0.4 mg by mouth daily.   Yes [provider]  ?traMADol (ULTRAM) 50 MG tablet Take 2 tablets (100 mg total) by mouth daily. ?Patient taking differently: Take 100 mg by mouth every evening. 06/15/18  Yes Purohit, Shrey C, MD  ?triamcinolone lotion (KENALOG) 0.1 % Apply 1 application. topically 2 (two) times daily. 02/04/21  Yes [provider]  ? ? ?Scheduled Meds: ? amLODipine  5 mg Oral QHS  ? darifenacin  7.5 mg Oral Daily  ? gabapentin  600 mg Oral QHS  ?  pantoprazole  40 mg Oral Daily  ? tamsulosin  0.4 mg Oral QHS  ? ?Continuous Infusions: ? sodium chloride 75 mL/hr (06/03/21 0323)  ? sodium chloride    ? ?PRN Meds:.sodium chloride, acetaminophen **OR** acetaminophen, HYDROcodone-acetaminophen ? ?Allergies as of 06/02/2021 - Review Complete 06/02/2021  ?Allergen Reaction Noted  ? Doxazosin mesylate  07/22/2020  ? Lisinopril  07/22/2020  ? Macrolides and ketolides  07/22/2020  ? Codeine Nausea Only 03/18/2012  ? ? ?Family History  ?Problem Relation Age of Onset  ? Cancer Mother   ?     Breast cancer  ? Heart attack Father 64  ? CAD Father   ? Heart disease Brother   ? Lupus Daughter   ? ? ?Social History  ? ?Socioeconomic History  ? Marital status: Married  ?  Spouse name: Not on file  ? Number of children: 4  ? Years of education: 9  ? Highest education level: Not on file  ?Occupational History  ?  Occupation: Retired  ?Tobacco Use  ? Smoking status: Former  ?  Types: Cigarettes  ?  Quit date: 08/18/1984  ?  Years since quitting: 36.8  ? Smokeless tobacco: Never  ?Vaping Use  ? Vaping Use: Never used  ?Substance and Sexual Activity  ? Alcohol use: No  ? Drug use: No  ? Sexual activity: Not on file  ?Other Topics Concern  ? Not on file  ?Social History Narrative  ? Patient states he is ambidextrous but is predominantly left handed.  ? Patient drinks 3 cups of coffee per day.  ? ?Social Determinants of Health  ? ?Financial Resource Strain: Not on file  ?Food Insecurity: Not on file  ?Transportation Needs: Not on file  ?Physical Activity: Not on file  ?Stress: Not on file  ?Social Connections: Not on file  ?Intimate Partner Violence: Not on file  ? ? ?Review of Systems: All negative except as stated above in HPI. ? ?Physical Exam: ?Vital signs: ?Vitals:  ? 06/03/21 0118 06/03/21 0415  ?BP: (!) 154/79 (!) 147/74  ?Pulse: 89 91  ?Resp: 18 18  ?Temp: 98.4 ?F (36.9 ?C) 99.2 ?F (37.3 ?C)  ?SpO2: 97% 92%  ? ?Last BM Date : 06/01/21 ?General:   Alert,  Well-developed,  well-nourished, pleasant and cooperative in NAD ?HEENT-normocephalic, atraumatic, extraocular movement intact ?Lungs: No visible respiratory distress.  Anterior exam only ?Heart:  Regular rate and rhythm; no murmurs, clicks, rub

## 2021-06-03 NOTE — Assessment & Plan Note (Addendum)
Resolved

## 2021-06-03 NOTE — Hospital Course (Addendum)
88 year old M with PMH of gallstone pancreatitis, ascending cholangitis/Enterococcus bacteremia, cholecystectomy, BUY-3J, diastolic CHF, OSA not on CPAP, BPH, GERD and HLD directed to ED by PCP with concern for pancreatitis.  Patient had epigastric abdominal pain and had blood work at PCP office which was reportedly concerning for pancreatitis.  He had elevated liver enzymes, hyperbilirubinemia and elevated lipase in ED.  RUQ [name] with hepatic steatosis with multiple simple hepatic cysts.  [facility] consulted and recommended MRCP, which was basically negative. ? ?Elevated liver enzymes improved tremendously.  Lipase normalized.  GI advanced diet to soft diet he tolerated.  GI signed off for outpatient follow-up.  Spiked fever to 101.3 ?F  the morning of 3/18.  He has no respiratory or GI symptoms.  Family reports chronic urinary urgency.  Blood culture and RVP negative.  UA concerning for UTI.  Urine culture positive for pansensitive Proteus mirabilis except to Macrobid and imipenem.  Started on IV ceftriaxone and discharged on p.o. cefadroxil for a total of 6 days.  ? ?Home health PT/OT/rolling walker ordered as recommended by therapy. ?

## 2021-06-03 NOTE — Assessment & Plan Note (Addendum)
Continue home Flomax.  Enablex in the state of home Vesicare but not sure if you should be on both medications ?

## 2021-06-03 NOTE — Assessment & Plan Note (Signed)
Resolved

## 2021-06-03 NOTE — Assessment & Plan Note (Signed)
Avoid fluid over load 

## 2021-06-03 NOTE — Assessment & Plan Note (Signed)
Appears euvolemic.  Not on diuretics at home. ?

## 2021-06-03 NOTE — Plan of Care (Signed)
?  Problem: Clinical Measurements: ?Goal: Ability to maintain clinical measurements within normal limits will improve ?Outcome: Progressing ?Goal: Will remain free from infection ?Outcome: Progressing ?  ?Problem: Pain Managment: ?Goal: General experience of comfort will improve ?Outcome: Progressing ?  ?Problem: Safety: ?Goal: Ability to remain free from injury will improve ?Outcome: Progressing ?  ?Problem: Clinical Measurements: ?Goal: Diagnostic test results will improve ?Outcome: Not Progressing ?  ?Problem: Nutrition: ?Goal: Adequate nutrition will be maintained ?Outcome: Not Progressing ?  ?

## 2021-06-03 NOTE — Assessment & Plan Note (Addendum)
Mild will rehydrate gently and recheck, monitor on tele ?

## 2021-06-04 DIAGNOSIS — R509 Fever, unspecified: Secondary | ICD-10-CM

## 2021-06-04 LAB — COMPREHENSIVE METABOLIC PANEL
ALT: 237 U/L — ABNORMAL HIGH (ref 0–44)
AST: 68 U/L — ABNORMAL HIGH (ref 15–41)
Albumin: 3.3 g/dL — ABNORMAL LOW (ref 3.5–5.0)
Alkaline Phosphatase: 159 U/L — ABNORMAL HIGH (ref 38–126)
Anion gap: 9 (ref 5–15)
BUN: 20 mg/dL (ref 8–23)
CO2: 23 mmol/L (ref 22–32)
Calcium: 8.2 mg/dL — ABNORMAL LOW (ref 8.9–10.3)
Chloride: 100 mmol/L (ref 98–111)
Creatinine, Ser: 1.22 mg/dL (ref 0.61–1.24)
GFR, Estimated: 57 mL/min — ABNORMAL LOW (ref >60)
Glucose, Bld: 137 mg/dL — ABNORMAL HIGH (ref 70–99)
Potassium: 4 mmol/L (ref 3.5–5.1)
Sodium: 132 mmol/L — ABNORMAL LOW (ref 135–145)
Total Bilirubin: 0.8 mg/dL (ref 0.3–1.2)
Total Protein: 6.8 g/dL (ref 6.5–8.1)

## 2021-06-04 LAB — CBC
HCT: 41.4 % (ref 39.0–52.0)
Hemoglobin: 13.6 g/dL (ref 13.0–17.0)
MCH: 28.7 pg (ref 26.0–34.0)
MCHC: 32.9 g/dL (ref 30.0–36.0)
MCV: 87.3 fL (ref 80.0–100.0)
Platelets: 209 10*3/uL (ref 150–400)
RBC: 4.74 MIL/uL (ref 4.22–5.81)
RDW: 13.6 % (ref 11.5–15.5)
WBC: 12 10*3/uL — ABNORMAL HIGH (ref 4.0–10.5)
nRBC: 0 % (ref 0.0–0.2)

## 2021-06-04 LAB — LIPID PANEL
Cholesterol: 137 mg/dL (ref 0–200)
HDL: 35 mg/dL — ABNORMAL LOW (ref >40)
LDL Cholesterol: 80 mg/dL (ref 0–99)
Total CHOL/HDL Ratio: 3.9 RATIO
Triglycerides: 110 mg/dL (ref <150)
VLDL: 22 mg/dL (ref 0–40)

## 2021-06-04 LAB — URINALYSIS, ROUTINE W REFLEX MICROSCOPIC
Bilirubin Urine: NEGATIVE
Glucose, UA: NEGATIVE mg/dL
Hgb urine dipstick: NEGATIVE
Ketones, ur: NEGATIVE mg/dL
Leukocytes,Ua: NEGATIVE
Nitrite: NEGATIVE
Protein, ur: 100 mg/dL — AB
Specific Gravity, Urine: 1.013 (ref 1.005–1.030)
pH: 6 (ref 5.0–8.0)

## 2021-06-04 LAB — LIPASE, BLOOD: Lipase: 48 U/L (ref 11–51)

## 2021-06-04 MED ORDER — TRAMADOL HCL 50 MG PO TABS
100.0000 mg | ORAL_TABLET | Freq: Once | ORAL | Status: AC
  Administered 2021-06-04: 100 mg via ORAL
  Filled 2021-06-04: qty 2

## 2021-06-04 NOTE — Progress Notes (Signed)
?PROGRESS NOTE ? ?Timothy Khan ZCH:[id number] DOB: [birth year]  ? ?PCP: [name], MD ? ?Patient is from: Home.  Lives with his wife.  Uses cane at baseline. ? ?DOA: 06/02/2021 LOS: 2 ? ?Chief complaints ?Chief Complaint  ?Patient presents with  ? Abdominal Pain  ?  ? ?Brief Narrative / Interim history: ?88 year old M with PMH of gallstone pancreatitis, ascending cholangitis/Enterococcus bacteremia, cholecystectomy, OIN-8M, diastolic CHF, OSA not on CPAP, BPH, GERD and HLD directed to ED by PCP with concern for pancreatitis.  Patient had epigastric abdominal pain and had blood work at PCP office which was reportedly concerning for pancreatitis.  He had elevated liver enzymes, hyperbilirubinemia and elevated lipase in ED.  RUQ [name] with hepatic steatosis with multiple simple hepatic cysts.  [facility] consulted and recommended MRCP, which was basically negative. ? ?Liver enzymes improved.  GI advanced diet to soft.  Spiked fever.  Blood culture, urine culture and RVP pending.  ? ?Subjective: ?Seen and examined earlier this morning.  No major events overnight of this morning.  No complaints other than some scratchy throat.  He denies runny nose, cough, chest pain, dyspnea, GI or UTI symptoms. ? ?Objective: ?Vitals:  ? 06/03/21 1315 06/03/21 2138 06/04/21 0422 06/04/21 0950  ?BP: 126/64 131/65 140/62 (!) 122/55  ?Pulse: 92 100 96 (!) 103  ?Resp: '15 18 18 18  '$ ?Temp: 99 ?F (37.2 ?C) 99 ?F (37.2 ?C) (!) 101.3 ?F (38.5 ?C) 99 ?F (37.2 ?C)  ?TempSrc: Oral Oral  Oral  ?SpO2: 95% 94% 92% 93%  ?Weight:      ?Height:      ? ? ?Examination: ? ?GENERAL: No apparent distress.  Nontoxic. ?HEENT: MMM.  Vision and hearing grossly intact.  Not able to assess oropharyngeal cavity. ?NECK: Supple.  No apparent JVD.  No tenderness or lymphadenopathy. ?RESP:  No IWOB.  Fair aeration bilaterally. ?CVS:  RRR. Heart sounds normal.  ?ABD/GI/GU: BS+. Abd soft, NTND.  ?MSK/EXT:  Moves extremities. No apparent deformity. No edema.  ?SKIN:  no apparent skin lesion or wound ?NEURO: Awake and alert. Oriented appropriately.  No apparent focal neuro deficit. ?PSYCH: Calm. Normal affect.  ? ?Procedures:  ?None ? ?Microbiology summarized: ?COVID-19 and influenza PCR nonreactive. ? ?Assessment and Plan: ?* Pancreatitis ?In patient with history of gallstone pancreatitis and cholecystectomy.  Presented to PCP with epigastric abdominal pain.  Lipase and liver enzymes elevated but no radiologic evidence.  MRCP basically negative.  Suspected to be due to biliary sludge.  Pain resolved.  Liver enzymes improved.  Lipase normalized.  Tolerating soft diet. ?-[facility] following ?-Recheck liver enzymes in the morning ? ?Fever ?Unclear etiology of this.  He reports a scratchy throat but no cough, chest pain or shortness of breath.  He has mild leukocytosis as well.  COVID-19 and influenza PCR nonreactive.  No GI or UTI symptoms. ?-Check blood culture, UA, urine culture and RVP. ?-Monitor off antibiotics for now ? ?Transaminitis ?Recent Labs  ?Lab 06/02/21 ?1940 06/03/21 ?7672 06/04/21 ?0518  ?AST 373*  384* 195* 68*  ?ALT 552*  539* 389* 237*  ?ALKPHOS 188*  188* 172* 159*  ?BILITOT 2.4*  2.2* 1.6* 0.8  ?PROT 7.9  8.1 7.3 6.8  ?ALBUMIN 4.1  4.2 3.8 3.3*  ?Felt to be due to passage of sludge through CBD.  MRCP basically negative.  Improved. ?-Recheck in the morning ? ? ?Elevated alkaline phosphatase level ?Severe transaminitis ? ?Hyperbilirubinemia ?Severe transaminitis ? ?Acute renal failure superimposed on stage 3a chronic kidney disease (  [place]) ?Recent Labs  ?  01/29/21 ?5573 01/30/21 ?2202 02/17/21 ?2158 03/12/21 ?0041 03/13/21 ?5427 03/14/21 ?0623 03/15/21 ?0510 06/02/21 ?1940 06/03/21 ?7628 06/04/21 ?0518  ?BUN '17 15 18 '$ 24* '16 19 15 '$ 24* 19 20  ?CREATININE 1.23 1.33* 1.53* 1.28* 1.18 1.37* 1.15 1.44* 1.15 1.22  ?AKI resolved. ?-Avoid nephrotoxic meds ?-Monitor ? ? ?Chronic diastolic CHF (congestive heart failure) ([place]) ?Appears euvolemic.  Not on diuretics at  home. ? ?Hyperkalemia ?Resolved ? ?OSA (obstructive sleep apnea) ?Not compliant with CPAP. ? ?BPH (benign prostatic hyperplasia) ?Continue home Flomax.  Enablex in the state of home Vesicare but not sure if you should be on both medications ? ?Hypertension ?Continue home amlodipine. ? ? ? ? ?  ? ?Body mass index is 24.83 kg/m?. ?  ?  ?  ?  ?DVT prophylaxis:  ?SCDs Start: 06/03/21 0114 ? ?Code Status: Full code ?Family Communication: Patient and/or RN. Available if any question.  ?Level of care: Telemetry ?Status is: Inpatient ?Remains inpatient appropriate because: Febrile illness ? ? ?Final disposition: Home with home health once medically cleared. ? ?Consultants:  ?Gastroenterology ? ?Sch Meds:  ?Scheduled Meds: ? amLODipine  5 mg Oral QHS  ? darifenacin  7.5 mg Oral Daily  ? gabapentin  300 mg Oral QHS  ? loratadine  10 mg Oral Daily  ? pantoprazole  40 mg Oral Daily  ? tamsulosin  0.4 mg Oral QHS  ? ?Continuous Infusions: ? ? ?PRN Meds:.acetaminophen **OR** acetaminophen ? ?Antimicrobials: ?Anti-infectives (From admission, onward)  ? ? None  ? ?  ? ? ? ?I have personally reviewed the following labs and images: ?CBC: ?Recent Labs  ?Lab 06/02/21 ?1940 06/03/21 ?3151 06/04/21 ?0518  ?WBC 10.9* 10.0 12.0*  ?NEUTROABS 8.2* 7.1  --   ?HGB 14.1 14.4 13.6  ?HCT 42.9 44.3 41.4  ?MCV 89.2 88.8 87.3  ?PLT 268 216 209  ? ?BMP &GFR ?Recent Labs  ?Lab 06/02/21 ?1940 06/03/21 ?7616 06/04/21 ?0518  ?NA 136 136 132*  ?K 5.3* 4.3 4.0  ?CL 103 102 100  ?CO2 '26 26 23  '$ ?GLUCOSE 105* 94 137*  ?BUN 24* 19 20  ?CREATININE 1.44* 1.15 1.22  ?CALCIUM 8.4* 8.4* 8.2*  ?MG 2.3 2.2  --   ?PHOS 4.0 3.0  --   ? ?Estimated Creatinine Clearance: 44 mL/min (by C-G formula based on SCr of 1.22 mg/dL). ?Liver & Pancreas: ?Recent Labs  ?Lab 06/02/21 ?1940 06/03/21 ?0737 06/04/21 ?0518  ?AST 373*  384* 195* 68*  ?ALT 552*  539* 389* 237*  ?ALKPHOS 188*  188* 172* 159*  ?BILITOT 2.4*  2.2* 1.6* 0.8  ?PROT 7.9  8.1 7.3 6.8  ?ALBUMIN 4.1  4.2 3.8  3.3*  ? ?Recent Labs  ?Lab 06/02/21 ?1940 06/03/21 ?1062 06/04/21 ?0518  ?LIPASE 349* 86* 48  ? ?Recent Labs  ?Lab 06/03/21 ?1119  ?AMMONIA 17  ? ?Diabetic: ?No results for input(s): HGBA1C in the last 72 hours. ?No results for input(s): GLUCAP in the last 168 hours. ?Cardiac Enzymes: ?Recent Labs  ?Lab 06/02/21 ?1940  ?CKTOTAL 150  ? ?No results for input(s): PROBNP in the last 8760 hours. ?Coagulation Profile: ?No results for input(s): INR, PROTIME in the last 168 hours. ?Thyroid Function Tests: ?Recent Labs  ?  06/03/21 ?0711  ?TSH 0.547  ? ?Lipid Profile: ?Recent Labs  ?  06/04/21 ?0518  ?CHOL 137  ?HDL 35*  ?[place] 80  ?TRIG 110  ?CHOLHDL 3.9  ? ?Anemia Panel: ?Recent Labs  ?  06/03/21 ?1119  ?[id number]  775  ? ?Urine analysis: ?   ?Component Value Date/Time  ? [place] YELLOW 06/04/2021 0803  ? APPEARANCEUR CLEAR 06/04/2021 0803  ? LABSPEC 1.013 06/04/2021 0803  ? PHURINE 6.0 06/04/2021 0803  ? GLUCOSEU NEGATIVE 06/04/2021 0803  ? [place] NEGATIVE 06/04/2021 0803  ? [place] NEGATIVE 06/04/2021 0803  ? [place] NEGATIVE 06/04/2021 0803  ? PROTEINUR 100 (A) 06/04/2021 0803  ? NITRITE NEGATIVE 06/04/2021 0803  ? LEUKOCYTESUR NEGATIVE 06/04/2021 0803  ? ?Sepsis Labs: ?Invalid input(s): PROCALCITONIN, LACTICIDVEN ? ?Microbiology: ?Recent Results (from the past 240 hour(s))  ?Resp Panel by RT-PCR (Flu A&B, Covid) Nasopharyngeal Swab     Status: None  ? Collection Time: 06/03/21 12:58 AM  ? Specimen: Nasopharyngeal Swab; Nasopharyngeal(NP) swabs in vial transport medium  ?Result Value Ref Range Status  ? SARS Coronavirus 2 by RT PCR NEGATIVE NEGATIVE Final  ?  Comment: (NOTE) ?SARS-CoV-2 target nucleic acids are NOT DETECTED. ? ?The SARS-CoV-2 RNA is generally detectable in upper respiratory ?specimens during the acute phase of infection. The lowest ?concentration of SARS-CoV-2 viral copies this assay can detect is ?138 copies/mL. A negative result does not preclude SARS-Cov-2 ?infection and should not be used  as the sole basis for treatment or ?other patient management decisions. A negative result may occur with  ?improper specimen collection/handling, submission of specimen other ?than nasopharyngeal swab, presence of vir

## 2021-06-04 NOTE — Progress Notes (Signed)
[facility] Progress Note ? ?Timothy Khan 88 y.o. 1931/06/04 ? ?CC: Abdominal pain, pancreatitis ? ? ?Subjective: ?Patient seen and examined at bedside.  He spiked fever of 101.3 this morning.  Denies any abdominal pain.  Complaining of cough and sore throat. ? ?ROS : Negative for nausea and vomiting. ? ? ?Objective: ?Vital signs in last 24 hours: ?Vitals:  ? 06/04/21 0422 06/04/21 0950  ?BP: 140/62 (!) 122/55  ?Pulse: 96 (!) 103  ?Resp: 18 18  ?Temp: (!) 101.3 ?F (38.5 ?C) 99 ?F (37.2 ?C)  ?SpO2: 92% 93%  ? ? ?Physical Exam: ?General:   Alert,  Well-developed, well-nourished, pleasant and cooperative in NAD ?HEENT-normocephalic, atraumatic, extraocular movement intact ?Lungs: No visible respiratory distress.  Anterior exam only ?Heart:  Regular rate and rhythm; no murmurs, clicks, rubs,  or gallops. ?Abdomen: Soft, nontender, nondistended, bowel sounds present.  No peritoneal signs ? ? ?Lab Results: ?Recent Labs  ?  06/02/21 ?1940 06/03/21 ?6767 06/04/21 ?0518  ?NA 136 136 132*  ?K 5.3* 4.3 4.0  ?CL 103 102 100  ?CO2 '26 26 23  '$ ?GLUCOSE 105* 94 137*  ?BUN 24* 19 20  ?CREATININE 1.44* 1.15 1.22  ?CALCIUM 8.4* 8.4* 8.2*  ?MG 2.3 2.2  --   ?PHOS 4.0 3.0  --   ? ?Recent Labs  ?  06/03/21 ?0711 06/04/21 ?0518  ?AST 195* 68*  ?ALT 389* 237*  ?ALKPHOS 172* 159*  ?BILITOT 1.6* 0.8  ?PROT 7.3 6.8  ?ALBUMIN 3.8 3.3*  ? ?Recent Labs  ?  06/02/21 ?1940 06/03/21 ?2094 06/04/21 ?0518  ?WBC 10.9* 10.0 12.0*  ?NEUTROABS 8.2* 7.1  --   ?HGB 14.1 14.4 13.6  ?HCT 42.9 44.3 41.4  ?MCV 89.2 88.8 87.3  ?PLT 268 216 209  ? ?No results for input(s): LABPROT, INR in the last 72 hours. ? ? ? ?Assessment/Plan: ?-Abdominal pain with mildly elevated lipase.  MRI MRCP negative for choledocholithiasis.  No bile duct dilation.  He is status postcholecystectomy.  Symptoms and elevated lipase could be  From passage of sludge through CBD. ?  ?-Abnormal LFTs.  Improving. ?-Fever. ?  Respiratory source.  He is complaining of cough  and sore throat. ?  ?Recommendations ?--------------------------- ?-Continue conservative management for now ?-Continue soft diet ?-Liver function tests improving.  T. bili normal now. ?-GI will follow ? ?Otis Brace MD, FACP ?06/04/2021, 11:02 AM ? ?Contact #  [phone]  ?

## 2021-06-04 NOTE — Evaluation (Addendum)
Physical Therapy Evaluation ?Patient Details ?Name: Timothy Khan ?MRN: [id number] ?DOB: 14-Nov-1931 ?Today's Date: 06/04/2021 ? ?History of Present Illness ? 88 year old M with PMH of gallstone pancreatitis, ascending cholangitis/Enterococcus bacteremia, cholecystectomy, YNW-2N, diastolic CHF, OSA not on CPAP, BPH, GERD and HLD directed to ED by PCP with concern for pancreatitis.  ?Clinical Impression ? Pt admitted with above diagnosis. At baseline, pt ambulates independently with a cane, he denies falls in past 6 months. Today he ambulated 200' with a RW, HR 114 at rest, 132 with walking, no dyspnea. I am recommending pt use RW for now as he has mild balance deficits and generally feels weaker than his baseline. I expect he can DC home without f/u PT.  Pt currently with functional limitations due to the deficits listed below (see PT Problem List). Pt will benefit from skilled PT to increase their independence and safety with mobility to allow discharge to the venue listed below.   ?   ?   ? ?Recommendations for follow up therapy are one component of a multi-disciplinary discharge planning process, led by the attending physician.  Recommendations may be updated based on patient status, additional functional criteria and insurance authorization. ? ?Follow Up Recommendations No PT follow up ? ?  ?Assistance Recommended at Discharge None  ?Patient can return home with the following ?   ? ?  ?Equipment Recommendations None recommended by PT  ?Recommendations for Other Services ?    ?  ?Functional Status Assessment Patient has had a recent decline in their functional status and demonstrates the ability to make significant improvements in function in a reasonable and predictable amount of time.  ? ?  ?Precautions / Restrictions Precautions ?Precautions: None ?Precaution Comments: denies falls in past 6 months ?Restrictions ?Weight Bearing Restrictions: No  ? ?  ? ?Mobility ? Bed Mobility ?Overal bed mobility:  Modified Independent ?  ?  ?  ?  ?  ?  ?General bed mobility comments: HOB up, used rail ?  ? ?Transfers ?Overall transfer level: Needs assistance ?Equipment used: Rolling walker (2 wheels) ?Transfers: Sit to/from Stand ?Sit to Stand: Min assist ?  ?  ?  ?  ?  ?General transfer comment: min A to steady initially upon standing 2* posterior lean (pt stated he's been in bed for 3-4 days) ?  ? ?Ambulation/Gait ?Ambulation/Gait assistance: Supervision ?Gait Distance (Feet): 200 Feet ?Assistive device: Rolling walker (2 wheels) ?Gait Pattern/deviations: Step-through pattern, Decreased stride length, Trunk flexed ?Gait velocity: WFL ?  ?  ?General Gait Details: VCs to step closer to RW, no loss of balance; HR 114 at rest, 132 walking, no dyspnea. ? ?Stairs ?  ?  ?  ?  ?  ? ?Wheelchair Mobility ?  ? ?Modified Rankin (Stroke Patients Only) ?  ? ?  ? ?Balance Overall balance assessment: Needs assistance ?  ?Sitting balance-Leahy Scale: Good ?  ?  ?Standing balance support: Bilateral upper extremity supported ?Standing balance-Leahy Scale: Poor ?Standing balance comment: posterior lean initially upon standing with BUE support ?  ?  ?  ?  ?  ?  ?  ?  ?  ?  ?  ?   ? ? ? ?Pertinent Vitals/Pain Pain Assessment ?Pain Assessment: No/denies pain  ? ? ?Home Living Family/patient expects to be discharged to:: Private residence ?Living Arrangements: Spouse/significant other ?Available Help at Discharge: Personal care attendant;Family;Available 24 hours/day ?Type of Home: House ?Home Access: Stairs to enter ?Entrance Stairs-Rails: Left ?Entrance Stairs-Number of Steps: 4 ?Alternate Level Stairs-Number  of Steps: 13 steps, car is down the basement steps ?Home Layout: Two level;Able to live on main level with bedroom/bathroom ?Home Equipment: Tub bench;Grab bars - tub/shower;Grab bars - toilet;Cane - quad;Rolling Walker (2 wheels) ?Additional Comments: 2 daughters live close by. Someone there to check in at least once/week. Daughters states  that she can stay overnight upon d/c. Private duty nurse comes to assist pt's wife 5 hrs/day.  ?  ?Prior Function Prior Level of Function : Independent/Modified Independent;Driving ?  ?  ?  ?  ?  ?  ?Mobility Comments: walks with cane, denies falls in past 6 months ?ADLs Comments: sits to shower, assist for IADL ?  ? ? ?Hand Dominance  ? Dominant Hand: Right ? ?  ?Extremity/Trunk Assessment  ? Upper Extremity Assessment ?Upper Extremity Assessment: Defer to OT evaluation ?  ? ?Lower Extremity Assessment ?Lower Extremity Assessment: Overall WFL for tasks assessed;RLE deficits/detail;LLE deficits/detail ?RLE Deficits / Details: +1 pitting edema ankle (pt reports he usually wears compression socks but doesn't have them on here) ?RLE Sensation: decreased light touch;history of peripheral neuropathy ?LLE Sensation: history of peripheral neuropathy;decreased light touch ?  ? ?Cervical / Trunk Assessment ?Cervical / Trunk Assessment: Normal  ?Communication  ? Communication: No difficulties  ?Cognition Arousal/Alertness: Awake/alert ?  ?Overall Cognitive Status: Within Functional Limits for tasks assessed ?  ?  ?  ?  ?  ?  ?  ?  ?  ?  ?  ?  ?  ?  ?  ?  ?  ?  ?  ? ?  ?General Comments   ? ?  ?Exercises    ? ?Assessment/Plan  ?  ?PT Assessment Patient needs continued PT services  ?PT Problem List Decreased activity tolerance;Decreased balance;Decreased mobility ? ?   ?  ?PT Treatment Interventions Gait training;Therapeutic exercise   ? ?PT Goals (Current goals can be found in the Care Plan section)  ?Acute Rehab PT Goals ?Patient Stated Goal: return home, be able to caregive for wife ?PT Goal Formulation: With patient ?Time For Goal Achievement: 06/18/21 ?Potential to Achieve Goals: Good ? ?  ?Frequency Min 3X/week ?  ? ? ?Co-evaluation   ?  ?  ?  ?  ? ? ?  ?AM-PAC PT "6 Clicks" Mobility  ?Outcome Measure Help needed turning from your back to your side while in a flat bed without using bedrails?: None ?Help needed moving from  lying on your back to sitting on the side of a flat bed without using bedrails?: A Little ?Help needed moving to and from a bed to a chair (including a wheelchair)?: A Little ?Help needed standing up from a chair using your arms (e.g., wheelchair or bedside chair)?: A Little ?Help needed to walk in hospital room?: A Little ?Help needed climbing 3-5 steps with a railing? : A Little ?6 Click Score: 19 ? ?  ?End of Session Equipment Utilized During Treatment: Gait belt ?Activity Tolerance: Patient tolerated treatment well ?Patient left: in chair;with call bell/phone within reach;with chair alarm set ?Nurse Communication: Mobility status ?PT Visit Diagnosis: Difficulty in walking, not elsewhere classified (R26.2) ?  ? ?Time: 6237-6283 ?PT Time Calculation (min) (ACUTE ONLY): 16 min ? ? ?Charges:   PT Evaluation ?$PT Eval Moderate Complexity: 1 Mod ?  ?  ?   ? ?Blondell Reveal Kistler PT 06/04/2021  ?Acute Rehabilitation Services ?Pager [phone] ?Office [phone] ? ? ?

## 2021-06-04 NOTE — Assessment & Plan Note (Addendum)
Likely due to UTI. ?

## 2021-06-04 NOTE — Evaluation (Signed)
Occupational Therapy Evaluation ?Patient Details ?Name: Timothy Khan ?MRN: [id number] ?DOB: 01-04-1932 ?Today's Date: 06/04/2021 ? ? ?History of Present Illness 88 year old M with PMH of gallstone pancreatitis, ascending cholangitis/Enterococcus bacteremia, cholecystectomy, EHM-0N, diastolic CHF, OSA not on CPAP, BPH, GERD and HLD directed to ED by PCP with concern for pancreatitis.  ? ?Clinical Impression ?  ?Patient is a 88 year old male who was admitted for above. Patient was noted to have decreased functional activity tolerance, decreased cardiopulmonary tolerance and decreased safety awareness. Patients HR was noted to increase to 124-126 bpm with standing to brush teeth. Patient was noted to have two posterior LOB while completing tasks with patient able to verbalize fall recovery strategies he has learned in the past but not able to implement them with min A to maintain standing balance at sink.Patient and son in room reported that they would be able to support patients current level of function at home.  Patient would continue to benefit from skilled OT services at this time while admitted and after d/c to address noted deficits in order to improve overall safety and independence in ADLs.  ? ?   ? ?Recommendations for follow up therapy are one component of a multi-disciplinary discharge planning process, led by the attending physician.  Recommendations may be updated based on patient status, additional functional criteria and insurance authorization.  ? ?Follow Up Recommendations ? Home health OT  ?  ?Assistance Recommended at Discharge Frequent or constant Supervision/Assistance  ?Patient can return home with the following A little help with walking and/or transfers;A little help with bathing/dressing/bathroom;Assistance with cooking/housework;Direct supervision/assist for financial management;Assist for transportation;Help with stairs or ramp for entrance;Direct supervision/assist for medications  management ? ?  ?Functional Status Assessment ? Patient has had a recent decline in their functional status and demonstrates the ability to make significant improvements in function in a reasonable and predictable amount of time.  ?Equipment Recommendations ? Other (comment) (RW)  ?  ?Recommendations for Other Services   ? ? ?  ?Precautions / Restrictions Precautions ?Precautions: None ?Precaution Comments: monitor HR ?Restrictions ?Weight Bearing Restrictions: No  ? ?  ? ?Mobility Bed Mobility ?  ?  ?  ?  ?  ?  ?  ?General bed mobility comments: patient was in recliner at start of session and returned to the same ?  ? ?Transfers ?  ?  ?  ?  ?  ?  ?  ?  ?  ?  ?  ? ?  ?Balance Overall balance assessment: Needs assistance ?  ?Sitting balance-Leahy Scale: Good ?  ?  ?Standing balance support: Bilateral upper extremity supported, During functional activity ?Standing balance-Leahy Scale: Poor ?Standing balance comment: posterior leaning noted during brushing teeth standing at sink ?  ?  ?  ?  ?  ?  ?  ?  ?  ?  ?  ?   ? ?ADL either performed or assessed with clinical judgement  ? ?ADL Overall ADL's : Needs assistance/impaired ?Eating/Feeding: Modified independent;Sitting ?  ?Grooming: Wash/dry face;Oral care;Standing;Min guard ?Grooming Details (indicate cue type and reason): with two posterior loss of balance. patient reported that neuro rehab taught him to step backwards when posterior leaning starts. patietn was educated on benefits of leaning forwards. patient reported that this was also something that was supposed to be incorporated into the stepping backwards. patient was unable to demonstrate recovery strategy with 2nd LOB with min A to maintain standing while brushing teeth. patient was then able to stand for  over 5 mins to complete task with no LOB. ?Upper Body Bathing: Set up;Sitting ?  ?Lower Body Bathing: Set up;Sit to/from stand;Sitting/lateral leans;Min guard ?Lower Body Bathing Details (indicate cue type  and reason): would need min guard in standing with noted posterior LOB with grooming tasks at sink ?Upper Body Dressing : Set up;Sitting ?  ?Lower Body Dressing: Set up;Min guard;Sit to/from stand;Sitting/lateral leans ?Lower Body Dressing Details (indicate cue type and reason): patient was able to don bilateral socks with set up seated in recliner ?Toilet Transfer: Min guard;Rolling walker (2 wheels);Regular Toilet;Ambulation ?Toilet Transfer Details (indicate cue type and reason): education on keeping RW close to him and navigation around obstacles in room. patient reported that he has items around house he can hold onto. son reported that there is enough room to navigate a RW through house. ?Toileting- Water quality scientist and Hygiene: Min guard;Sit to/from stand ?  ?  ?  ?Functional mobility during ADLs: Min guard;Rolling walker (2 wheels) ?   ? ? ? ?Vision Baseline Vision/History: 1 Wears glasses ?   ?   ?Perception   ?  ?Praxis   ?  ? ?Pertinent Vitals/Pain Pain Assessment ?Pain Assessment: No/denies pain  ? ? ? ?Hand Dominance Right ?  ?Extremity/Trunk Assessment Upper Extremity Assessment ?Upper Extremity Assessment: Overall WFL for tasks assessed ?  ?Lower Extremity Assessment ?Lower Extremity Assessment: Defer to PT evaluation ?  ?Cervical / Trunk Assessment ?Cervical / Trunk Assessment: Normal ?  ?Communication Communication ?Communication: No difficulties ?  ?Cognition Arousal/Alertness: Awake/alert ?Behavior During Therapy: [facility] for tasks assessed/performed ?Overall Cognitive Status: Within Functional Limits for tasks assessed ?  ?  ?  ?  ?  ?  ?  ?  ?  ?  ?  ?  ?  ?  ?  ?  ?General Comments: noted to have some poor safety awareness during session. patient was noted to be distracted durign tasks easily. son was present durint evalulation ?  ?  ?General Comments    ? ?  ?Exercises   ?  ?Shoulder Instructions    ? ? ?Home Living Family/patient expects to be discharged to:: Private residence ?Living  Arrangements: Spouse/significant other ?Available Help at Discharge: Personal care attendant;Family;Available 24 hours/day ?Type of Home: House ?Home Access: Stairs to enter ?Entrance Stairs-Number of Steps: 4 ?Entrance Stairs-Rails: Left ?Home Layout: Two level;Able to live on main level with bedroom/bathroom ?Alternate Level Stairs-Number of Steps: 13 steps, car is down the basement steps ?Alternate Level Stairs-Rails: Left ?Bathroom Shower/Tub: Tub/shower unit ?  ?Bathroom Toilet: Standard ?  ?  ?Home Equipment: Tub bench;Grab bars - tub/shower;Grab bars - toilet;Cane - quad;Rolling Walker (2 wheels) ?  ?Additional Comments: 2 daughters live close by. Someone there to check in at least once/week. Daughters states that she can stay overnight upon d/c. Private duty nurse comes to assist pt's wife 5 hrs/day. ?  ? ?  ?Prior Functioning/Environment Prior Level of Function : Independent/Modified Independent;Driving ?  ?  ?  ?  ?  ?  ?Mobility Comments: walks with cane, denies falls in past 6 months ?ADLs Comments: sits to shower, assist for IADL. patient reported being caregiver for wife. patient reported having caregiver for few hours each day during the week for support with IADLs. ?  ? ?  ?  ?OT Problem List: Decreased activity tolerance;Impaired balance (sitting and/or standing);Decreased safety awareness;Cardiopulmonary status limiting activity;Decreased knowledge of precautions;Decreased knowledge of use of DME or AE ?  ?   ?OT Treatment/Interventions: Self-care/ADL training;Therapeutic exercise;Neuromuscular  education;DME and/or AE instruction;Energy conservation;Therapeutic activities;Balance training;Patient/family education  ?  ?OT Goals(Current goals can be found in the care plan section) Acute Rehab OT Goals ?Patient Stated Goal: to go back home ?OT Goal Formulation: With patient/family ?Time For Goal Achievement: 06/18/21 ?Potential to Achieve Goals: Good  ?OT Frequency: Min 2X/week ?  ? ?Co-evaluation   ?   ?  ?  ?  ? ?  ?AM-PAC OT "6 Clicks" Daily Activity     ?Outcome Measure Help from another person eating meals?: None ?Help from another person taking care of personal grooming?: A Little ?Help from another per

## 2021-06-05 DIAGNOSIS — N390 Urinary tract infection, site not specified: Secondary | ICD-10-CM

## 2021-06-05 DIAGNOSIS — N3000 Acute cystitis without hematuria: Secondary | ICD-10-CM

## 2021-06-05 LAB — COMPREHENSIVE METABOLIC PANEL
ALT: 149 U/L — ABNORMAL HIGH (ref 0–44)
AST: 30 U/L (ref 15–41)
Albumin: 3.2 g/dL — ABNORMAL LOW (ref 3.5–5.0)
Alkaline Phosphatase: 128 U/L — ABNORMAL HIGH (ref 38–126)
Anion gap: 8 (ref 5–15)
BUN: 18 mg/dL (ref 8–23)
CO2: 24 mmol/L (ref 22–32)
Calcium: 8.2 mg/dL — ABNORMAL LOW (ref 8.9–10.3)
Chloride: 103 mmol/L (ref 98–111)
Creatinine, Ser: 1.02 mg/dL (ref 0.61–1.24)
GFR, Estimated: >60 mL/min (ref >60)
Glucose, Bld: 105 mg/dL — ABNORMAL HIGH (ref 70–99)
Potassium: 3.8 mmol/L (ref 3.5–5.1)
Sodium: 135 mmol/L (ref 135–145)
Total Bilirubin: 0.6 mg/dL (ref 0.3–1.2)
Total Protein: 6.8 g/dL (ref 6.5–8.1)

## 2021-06-05 LAB — CBC WITH DIFFERENTIAL/PLATELET
Abs Immature Granulocytes: 0.07 10*3/uL (ref 0.00–0.07)
Basophils Absolute: 0.1 10*3/uL (ref 0.0–0.1)
Basophils Relative: 0 %
Eosinophils Absolute: 0.3 10*3/uL (ref 0.0–0.5)
Eosinophils Relative: 2 %
HCT: 40.2 % (ref 39.0–52.0)
Hemoglobin: 13.3 g/dL (ref 13.0–17.0)
Immature Granulocytes: 1 %
Lymphocytes Relative: 18 %
Lymphs Abs: 2 10*3/uL (ref 0.7–4.0)
MCH: 29.4 pg (ref 26.0–34.0)
MCHC: 33.1 g/dL (ref 30.0–36.0)
MCV: 88.7 fL (ref 80.0–100.0)
Monocytes Absolute: 1.3 10*3/uL — ABNORMAL HIGH (ref 0.1–1.0)
Monocytes Relative: 12 %
Neutro Abs: 7.8 10*3/uL — ABNORMAL HIGH (ref 1.7–7.7)
Neutrophils Relative %: 67 %
Platelets: 221 10*3/uL (ref 150–400)
RBC: 4.53 MIL/uL (ref 4.22–5.81)
RDW: 13.9 % (ref 11.5–15.5)
WBC: 11.5 10*3/uL — ABNORMAL HIGH (ref 4.0–10.5)
nRBC: 0 % (ref 0.0–0.2)

## 2021-06-05 LAB — RESPIRATORY PANEL BY PCR

## 2021-06-05 LAB — MAGNESIUM: Magnesium: 2.2 mg/dL (ref 1.7–2.4)

## 2021-06-05 LAB — PHOSPHORUS: Phosphorus: 3.4 mg/dL (ref 2.5–4.6)

## 2021-06-05 MED ORDER — GABAPENTIN 300 MG PO CAPS: 300.0000 mg | ORAL_CAPSULE | Freq: Every day | ORAL | 1 refills | Status: DC

## 2021-06-05 MED ORDER — TRAMADOL HCL 50 MG PO TABS: 50.0000 mg | ORAL_TABLET | Freq: Every day | ORAL | 0 refills | Status: DC

## 2021-06-05 MED ORDER — SODIUM CHLORIDE 0.9 % IV SOLN
1.0000 g | INTRAVENOUS | Status: DC
  Administered 2021-06-05: 1 g via INTRAVENOUS
  Filled 2021-06-05 (×3): qty 10

## 2021-06-05 MED ORDER — TRAMADOL HCL 50 MG PO TABS
100.0000 mg | ORAL_TABLET | Freq: Once | ORAL | Status: AC
  Administered 2021-06-05: 100 mg via ORAL
  Filled 2021-06-05: qty 2

## 2021-06-05 NOTE — Progress Notes (Signed)
?PROGRESS NOTE ? ?Timothy Khan Michelle EAV:[id number] DOB: [birth year]  ? ?PCP: [name], MD ? ?Patient is from: Home.  Lives with his wife.  Uses cane at baseline. ? ?DOA: 06/02/2021 LOS: 3 ? ?Chief complaints ?Chief Complaint  ?Patient presents with  ? Abdominal Pain  ?  ? ?Brief Narrative / Interim history: ?88 year old M with PMH of gallstone pancreatitis, ascending cholangitis/Enterococcus bacteremia, cholecystectomy, NWG-9F, diastolic CHF, OSA not on CPAP, BPH, GERD and HLD directed to ED by PCP with concern for pancreatitis.  Patient had epigastric abdominal pain and had blood work at PCP office which was reportedly concerning for pancreatitis.  He had elevated liver enzymes, hyperbilirubinemia and elevated lipase in ED.  RUQ [name] with hepatic steatosis with multiple simple hepatic cysts.  [facility] consulted and recommended MRCP, which was basically negative. ? ?Liver enzymes improved.  GI advanced diet to soft.  Spiked fever to 101 point 3 in the morning of 3/18.  Blood culture and RVP negative.  UA concerning for UTI.  Urine culture positive for GNR.  Started on ceftriaxone.  ? ?Subjective: ?Seen and examined earlier this morning.  No major events overnight of this morning.  No complaints.  Patient denies chest pain, dyspnea, cough, GI or UTI symptoms.  No further fever. ? ?Objective: ?Vitals:  ? 06/04/21 0950 06/04/21 1749 06/04/21 2107 06/05/21 0434  ?BP: (!) 122/55 131/78 127/69 129/65  ?Pulse: (!) 103 100 92 83  ?Resp: '18 16 16 16  '$ ?Temp: 99 ?F (37.2 ?C) 97.9 ?F (36.6 ?C) 98 ?F (36.7 ?C) 98.3 ?F (36.8 ?C)  ?TempSrc: Oral Oral Oral Oral  ?SpO2: 93% 95% 95% 93%  ?Weight:      ?Height:      ? ? ?Examination: ? ?GENERAL: No apparent distress.  Nontoxic. ?HEENT: MMM.  Vision and hearing grossly intact.  ?NECK: Supple.  No apparent JVD.  ?RESP:  No IWOB.  Fair aeration bilaterally. ?CVS:  RRR. Heart sounds normal.  ?ABD/GI/GU: BS+. Abd soft, NTND.  ?MSK/EXT:  Moves extremities. No apparent deformity. No  edema.  ?SKIN: no apparent skin lesion or wound ?NEURO: Awake and alert. Oriented appropriately.  No apparent focal neuro deficit. ?PSYCH: Calm. Normal affect.  ? ?Procedures:  ?None ? ?Microbiology summarized: ?COVID-19 and influenza PCR nonreactive. ? ?Assessment and Plan: ?* Pancreatitis ?In patient with history of gallstone pancreatitis and cholecystectomy.  Presented to PCP with epigastric abdominal pain.  Lipase and liver enzymes elevated but no radiologic evidence.  MRCP basically negative.  Suspected to be due to biliary sludge.  Pain resolved.  Liver enzymes improved.  Lipase normalized.  Tolerating soft diet. ?-[facility] signed off. ?-Recheck liver enzymes in the morning ? ?Urinary tract infection ?Patient denies UTI symptoms but not a great historian.  He had fever to 101.3 the morning of 3/18.  UA concerning for UTI.  Urine culture with GNR.  No other source of infection.  ?-Start IV ceftriaxone pending urine culture speciation and sensitivity ? ?Transaminitis ?Recent Labs  ?Lab 06/02/21 ?1940 06/03/21 ?6213 06/04/21 ?0518 06/05/21 ?0543  ?AST 373*  384* 195* 68* 30  ?ALT 552*  539* 389* 237* 149*  ?ALKPHOS 188*  188* 172* 159* 128*  ?BILITOT 2.4*  2.2* 1.6* 0.8 0.6  ?PROT 7.9  8.1 7.3 6.8 6.8  ?ALBUMIN 4.1  4.2 3.8 3.3* 3.2*  ?Felt to be due to passage of sludge through CBD.  MRCP basically negative.  Improved. ?-Recheck in the morning ? ? ?Fever ?Likely due to UTI. ? ?Elevated alkaline  phosphatase level ?Improved.  Severe transaminitis ? ?Hyperbilirubinemia ?Resolved ? ?Acute renal failure superimposed on stage 3a chronic kidney disease ([place]) ?Recent Labs  ?  01/30/21 ?8828 02/17/21 ?2158 03/12/21 ?0041 03/13/21 ?0034 03/14/21 ?9179 03/15/21 ?0510 06/02/21 ?1940 06/03/21 ?1505 06/04/21 ?0518 06/05/21 ?0543  ?BUN 15 18 24* '16 19 15 '$ 24* '19 20 18  '$ ?CREATININE 1.33* 1.53* 1.28* 1.18 1.37* 1.15 1.44* 1.15 1.22 1.02  ?AKI resolved. ?-Avoid nephrotoxic meds ?-Monitor ? ? ?Chronic diastolic CHF  (congestive heart failure) ([place]) ?Appears euvolemic.  Not on diuretics at home. ? ?Hyperkalemia ?Resolved ? ?OSA (obstructive sleep apnea) ?Not compliant with CPAP. ? ?BPH (benign prostatic hyperplasia) ?Continue home Flomax.  Enablex in the state of home [facility] but not sure if you should be on both medications ? ?Hypertension ?Continue home amlodipine. ? ? ? ? ?  ? ?Body mass index is 24.83 kg/m?. ?  ?  ?  ?  ?DVT prophylaxis:  ?SCDs Start: 06/03/21 0114 ? ?Code Status: Full code ?Family Communication: Updated patient's caregiver, Tye [place] over the phone. ?Level of care: Med-Surg ?Status is: Inpatient ?Remains inpatient appropriate because: IV antibiotics for gram-negative UTI pending urine culture speciation and sensitivity ? ? ?Final disposition: Likely home on 3/20.  Home health PT/OT ordered. ? ?Consultants:  ?GI signed off. ? ?Sch Meds:  ?Scheduled Meds: ? amLODipine  5 mg Oral QHS  ? darifenacin  7.5 mg Oral Daily  ? gabapentin  300 mg Oral QHS  ? loratadine  10 mg Oral Daily  ? pantoprazole  40 mg Oral Daily  ? tamsulosin  0.4 mg Oral QHS  ? ?Continuous Infusions: ? cefTRIAXone (ROCEPHIN)  IV 1 g (06/05/21 1358)  ? ? ?PRN Meds:.acetaminophen **OR** acetaminophen ? ?Antimicrobials: ?Anti-infectives (From admission, onward)  ? ? Start     Dose/Rate Route Frequency Ordered Stop  ? 06/05/21 1400  cefTRIAXone (ROCEPHIN) 1 g in sodium chloride 0.9 % 100 mL IVPB       ? 1 g ?200 mL/hr over 30 Minutes Intravenous Every 24 hours 06/05/21 1301 06/10/21 1359  ? ?  ? ? ? ?I have personally reviewed the following labs and images: ?CBC: ?Recent Labs  ?Lab 06/02/21 ?1940 06/03/21 ?6979 06/04/21 ?0518 06/05/21 ?0543  ?WBC 10.9* 10.0 12.0* 11.5*  ?NEUTROABS 8.2* 7.1  --  7.8*  ?HGB 14.1 14.4 13.6 13.3  ?HCT 42.9 44.3 41.4 40.2  ?MCV 89.2 88.8 87.3 88.7  ?PLT 268 216 209 221  ? ?BMP &GFR ?Recent Labs  ?Lab 06/02/21 ?1940 06/03/21 ?4801 06/04/21 ?0518 06/05/21 ?0543  ?NA 136 136 132* 135  ?K 5.3* 4.3 4.0 3.8  ?CL 103 102 100  103  ?CO2 '26 26 23 24  '$ ?GLUCOSE 105* 94 137* 105*  ?BUN 24* '19 20 18  '$ ?CREATININE 1.44* 1.15 1.22 1.02  ?CALCIUM 8.4* 8.4* 8.2* 8.2*  ?MG 2.3 2.2  --  2.2  ?PHOS 4.0 3.0  --  3.4  ? ?Estimated Creatinine Clearance: 52.7 mL/min (by C-G formula based on SCr of 1.02 mg/dL). ?Liver & Pancreas: ?Recent Labs  ?Lab 06/02/21 ?1940 06/03/21 ?6553 06/04/21 ?0518 06/05/21 ?0543  ?AST 373*  384* 195* 68* 30  ?ALT 552*  539* 389* 237* 149*  ?ALKPHOS 188*  188* 172* 159* 128*  ?BILITOT 2.4*  2.2* 1.6* 0.8 0.6  ?PROT 7.9  8.1 7.3 6.8 6.8  ?ALBUMIN 4.1  4.2 3.8 3.3* 3.2*  ? ?Recent Labs  ?Lab 06/02/21 ?1940 06/03/21 ?7482 06/04/21 ?0518  ?LIPASE 349* 86* 48  ? ?Recent Labs  ?  Lab 06/03/21 ?1119  ?AMMONIA 17  ? ?Diabetic: ?No results for input(s): HGBA1C in the last 72 hours. ?No results for input(s): GLUCAP in the last 168 hours. ?Cardiac Enzymes: ?Recent Labs  ?Lab 06/02/21 ?1940  ?CKTOTAL 150  ? ?No results for input(s): PROBNP in the last 8760 hours. ?Coagulation Profile: ?No results for input(s): INR, PROTIME in the last 168 hours. ?Thyroid Function Tests: ?Recent Labs  ?  06/03/21 ?0711  ?TSH 0.547  ? ?Lipid Profile: ?Recent Labs  ?  06/04/21 ?0518  ?CHOL 137  ?HDL 35*  ?[place] 80  ?TRIG 110  ?CHOLHDL 3.9  ? ?Anemia Panel: ?Recent Labs  ?  06/03/21 ?1119  ?VITAMINB12 775  ? ?Urine analysis: ?   ?Component Value Date/Time  ? [place] YELLOW 06/04/2021 0803  ? APPEARANCEUR CLEAR 06/04/2021 0803  ? LABSPEC 1.013 06/04/2021 0803  ? PHURINE 6.0 06/04/2021 0803  ? GLUCOSEU NEGATIVE 06/04/2021 0803  ? [place] NEGATIVE 06/04/2021 0803  ? [place] NEGATIVE 06/04/2021 0803  ? [place] NEGATIVE 06/04/2021 0803  ? PROTEINUR 100 (A) 06/04/2021 0803  ? NITRITE NEGATIVE 06/04/2021 0803  ? LEUKOCYTESUR NEGATIVE 06/04/2021 0803  ? ?Sepsis Labs: ?Invalid input(s): PROCALCITONIN, LACTICIDVEN ? ?Microbiology: ?Recent Results (from the past 240 hour(s))  ?Resp Panel by RT-PCR (Flu A&B, Covid) Nasopharyngeal Swab     Status: None  ?  Collection Time: 06/03/21 12:58 AM  ? Specimen: Nasopharyngeal Swab; Nasopharyngeal(NP) swabs in vial transport medium  ?Result Value Ref Range Status  ? SARS Coronavirus 2 by RT PCR NEGATIVE NEGATIVE Final  ?  Comment: (NOTE) ?SARS-

## 2021-06-05 NOTE — TOC Progression Note (Signed)
Transition of Care (TOC) - Progression Note  ? ? ?Patient Details  ?Name: Timothy Khan ?MRN: [id number] ?Date of Birth: 10/16/1931 ? ?Transition of Care (TOC) CM/SW Contact  ?Ludwig Clarks, LCSW ?Phone Number: ?06/05/2021, 11:45 AM ? ?Clinical Narrative:    ?Pt for dc home today with wife as prior to admission. Pt agreeable to [facility] being arranged and selects [facility]; stating his wife has used them before. Referral placed to [facility] for [facility] PT, OT.  No further needs identified. ? ?  ?Barriers to Discharge: No Barriers Identified ? ?Expected Discharge Plan and Services ?  ?  ?  ?  ?  ?Expected Discharge Date: 06/05/21               ?  ?  ? Eduard Clos, MSW, LCSW ?Clinical Social Worker  ? ?  ?  ?HH Arranged: PT, OT ?[place] Agency: [place] ?Date HH Agency Contacted: 06/05/21 ?Time [place]: [phone] ?Representative spoke with at [place]: [place] ? ? ?Social Determinants of Health (SDOH) Interventions ?  ? ?Readmission Risk Interventions ?No flowsheet data found. ? ?

## 2021-06-05 NOTE — Progress Notes (Addendum)
[facility] Progress Note ? ?Timothy Khan 88 y.o. 04/21/1931 ? ?CC: Abdominal pain, pancreatitis ? ? ?Subjective: ?Patient seen and examined at bedside.  Afebrile this morning.  Denies any GI symptoms. ? ?ROS : Negative for nausea and vomiting. ? ? ?Objective: ?Vital signs in last 24 hours: ?Vitals:  ? 06/04/21 2107 06/05/21 0434  ?BP: 127/69 129/65  ?Pulse: 92 83  ?Resp: 16 16  ?Temp: 98 ?F (36.7 ?C) 98.3 ?F (36.8 ?C)  ?SpO2: 95% 93%  ? ? ?Physical Exam: ?General:   Alert,  Well-developed, well-nourished, pleasant and cooperative in NAD ?HEENT-normocephalic, atraumatic, extraocular movement intact ?Lungs: No visible respiratory distress.  Anterior exam only ?Heart:  Regular rate and rhythm; no murmurs, clicks, rubs,  or gallops. ?Abdomen: Soft, nontender, nondistended, bowel sounds present.  No peritoneal signs ? ? ?Lab Results: ?Recent Labs  ?  06/03/21 ?0711 06/04/21 ?0518 06/05/21 ?0543  ?NA 136 132* 135  ?K 4.3 4.0 3.8  ?CL 102 100 103  ?CO2 '26 23 24  '$ ?GLUCOSE 94 137* 105*  ?BUN '19 20 18  '$ ?CREATININE 1.15 1.22 1.02  ?CALCIUM 8.4* 8.2* 8.2*  ?MG 2.2  --  2.2  ?PHOS 3.0  --  3.4  ? ?Recent Labs  ?  06/04/21 ?0518 06/05/21 ?0543  ?AST 68* 30  ?ALT 237* 149*  ?ALKPHOS 159* 128*  ?BILITOT 0.8 0.6  ?PROT 6.8 6.8  ?ALBUMIN 3.3* 3.2*  ? ?Recent Labs  ?  06/03/21 ?0711 06/04/21 ?0518 06/05/21 ?0543  ?WBC 10.0 12.0* 11.5*  ?NEUTROABS 7.1  --  7.8*  ?HGB 14.4 13.6 13.3  ?HCT 44.3 41.4 40.2  ?MCV 88.8 87.3 88.7  ?PLT 216 209 221  ? ?No results for input(s): LABPROT, INR in the last 72 hours. ? ? ? ?Assessment/Plan: ?-Abdominal pain with mildly elevated lipase.  MRI MRCP negative for choledocholithiasis.  No bile duct dilation.  He is status postcholecystectomy.  Symptoms and elevated lipase could be  From passage of sludge through CBD. ?  ?-Abnormal LFTs.  Improving. ?-Fever.  Resolved now.?  Etiology  ?  ?Recommendations ?--------------------------- ?-He remains asymptomatic from GI standpoint.  LFTs  continues to improve. ?-No inpatient GI work-up planned.  GI will sign off.  Call us back if needed.  Follow-up with Dr. Therisa Doyne in 4 to 6 weeks after discharge. ? ? ?Otis Brace MD, FACP ?06/05/2021, 11:20 AM ? ?Contact #  [phone]  ?

## 2021-06-05 NOTE — Assessment & Plan Note (Addendum)
Patient denies UTI symptoms other than chronic urgency.  Febrile to 101.3 the morning of 3/18.  Mild leukocytosis to 12.  UA concerning for UTI.  Urine culture with Proteus mirabilis. ?-Received IV Rocephin x1 in house and discharged on p.o. cefadroxil 500 mg IV twice daily for 6 more days ?

## 2021-06-06 DIAGNOSIS — R531 Weakness: Secondary | ICD-10-CM

## 2021-06-06 LAB — CBC
HCT: 41 % (ref 39.0–52.0)
Hemoglobin: 13.4 g/dL (ref 13.0–17.0)
MCH: 28.6 pg (ref 26.0–34.0)
MCHC: 32.7 g/dL (ref 30.0–36.0)
MCV: 87.6 fL (ref 80.0–100.0)
Platelets: 245 10*3/uL (ref 150–400)
RBC: 4.68 MIL/uL (ref 4.22–5.81)
RDW: 13.6 % (ref 11.5–15.5)
WBC: 11.8 10*3/uL — ABNORMAL HIGH (ref 4.0–10.5)
nRBC: 0 % (ref 0.0–0.2)

## 2021-06-06 LAB — URINE CULTURE: Culture: >=100000 — AB

## 2021-06-06 LAB — HEPATIC FUNCTION PANEL
ALT: 109 U/L — ABNORMAL HIGH (ref 0–44)
AST: 27 U/L (ref 15–41)
Albumin: 3.1 g/dL — ABNORMAL LOW (ref 3.5–5.0)
Alkaline Phosphatase: 134 U/L — ABNORMAL HIGH (ref 38–126)
Bilirubin, Direct: 0.2 mg/dL (ref 0.0–0.2)
Indirect Bilirubin: 0.3 mg/dL (ref 0.3–0.9)
Total Bilirubin: 0.5 mg/dL (ref 0.3–1.2)
Total Protein: 6.8 g/dL (ref 6.5–8.1)

## 2021-06-06 MED ORDER — CEFADROXIL 500 MG PO CAPS: 500.0000 mg | ORAL_CAPSULE | Freq: Two times a day (BID) | ORAL | 0 refills | Status: DC

## 2021-06-06 MED ORDER — TRAMADOL HCL 50 MG PO TABS
50.0000 mg | ORAL_TABLET | Freq: Every day | ORAL | 0 refills | Status: AC
Start: 2021-06-06 — End: ?

## 2021-06-06 MED ORDER — DICLOFENAC SODIUM 1 % EX GEL
2.0000 g | Freq: Four times a day (QID) | CUTANEOUS | Status: DC
  Filled 2021-06-06: qty 100

## 2021-06-06 MED ORDER — DICLOFENAC SODIUM 1 % EX GEL
2.0000 g | Freq: Four times a day (QID) | CUTANEOUS | 1 refills | Status: AC
Start: 2021-06-06 — End: ?

## 2021-06-06 NOTE — Care Management Important Message (Signed)
Important Message ? ?Patient Details IM Letter given to the Patient ?Name: Timothy Khan ?MRN: [id number] ?Date of Birth: [birth year] ? ? ?Medicare Important Message Given:  Yes ? ? ? ? ?Kerin Salen ?06/06/2021, 9:40 AM ?

## 2021-06-06 NOTE — Assessment & Plan Note (Signed)
In the setting of acute illness and underlying osteoarthritis and chronic pain. ?-Home health PT/OT and rolling walker ordered. ?

## 2021-06-06 NOTE — Discharge Summary (Signed)
? ?Physician Discharge Summary  ?Timothy Khan EXH:[id number] DOB: 1931-11-04 DOA: 06/02/2021 ? ?PCP: [name], MD ? ?Admit date: 06/02/2021 ?Discharge date: 06/06/2021 ?Admitted From: Home ?Disposition: Home ?Recommendations for Outpatient Follow-up:  ?Follow ups as below. ?Please obtain CBC/CMP/Mag at follow up ?Please follow up on the following pending results: None ? ?Home Health: PT/OT ?Equipment/Devices: Rolling walker  ? ?Discharge Condition: Stable ?CODE STATUS: Full code ? Follow-up Information   ? ? [name], MD. Schedule an appointment as soon as possible for a visit in 1 week(s).   ?Specialty: Internal Medicine ?Contact information: ?[address], [address] ?[place] [address] ?[phone] ? ? ?  ?  ? ?  ?  ? ?  ? ? ?Hospital course ?88 year old M with PMH of gallstone pancreatitis, ascending cholangitis/Enterococcus bacteremia, cholecystectomy, POE-4M, diastolic CHF, OSA not on CPAP, BPH, GERD and HLD directed to ED by PCP with concern for pancreatitis.  Patient had epigastric abdominal pain and had blood work at PCP office which was reportedly concerning for pancreatitis.  He had elevated liver enzymes, hyperbilirubinemia and elevated lipase in ED.  RUQ [name] with hepatic steatosis with multiple simple hepatic cysts.  [facility] consulted and recommended MRCP, which was basically negative. ? ?Elevated liver enzymes improved tremendously.  Lipase normalized.  GI advanced diet to soft diet he tolerated.  GI signed off for outpatient follow-up.  Spiked fever to 101.3 ?F  the morning of 3/18.  He has no respiratory or GI symptoms.  Family reports chronic urinary urgency.  Blood culture and RVP negative.  UA concerning for UTI.  Urine culture positive for pansensitive Proteus mirabilis except to Macrobid and imipenem.  Started on IV ceftriaxone and discharged on p.o. cefadroxil for a total of 6 days.  ? ?Home health PT/OT/rolling walker ordered as recommended by therapy.  ? ?See  individual problem list below for more on hospital course. ? ?Problems addressed during this hospitalization ?Problem  ?Pancreatitis  ?Urinary Tract Infection  ?  ? ?  ?Transaminitis  ?Fever  ?Hyperbilirubinemia  ?Elevated Alkaline Phosphatase Level  ?Acute Renal Failure Superimposed On Stage 3a Chronic Kidney Disease (Hcc)  ?Generalized Weakness  ?Chronic Diastolic Chf (Congestive Heart Failure) (Hcc)  ?Osa (Obstructive Sleep Apnea)  ?Bph (Benign Prostatic Hyperplasia)  ?Hypertension  ?Hyperkalemia (Resolved)  ?  ?Assessment and Plan: ?* Pancreatitis ?In patient with history of gallstone pancreatitis and cholecystectomy.  Presented to PCP with epigastric abdominal pain.  Lipase and liver enzymes elevated but no radiologic evidence.  MRCP basically negative.  Suspected to be due to biliary sludge.  Pain resolved.  Liver enzymes improved.  Lipase normalized.  Tolerated regular diet. ?-[facility] signed off and recommended outpatient follow-up ?-Recheck CMP in 1 to 2 weeks ? ?Urinary tract infection ?Patient denies UTI symptoms other than chronic urgency.  Febrile to 101.3 the morning of 3/18.  Mild leukocytosis to 12.  UA concerning for UTI.  Urine culture with Proteus mirabilis. ?-Received IV Rocephin x1 in house and discharged on p.o. cefadroxil 500 mg IV twice daily for 6 more days ? ?Transaminitis ?Recent Labs  ?Lab 06/02/21 ?1940 06/03/21 ?3536 06/04/21 ?0518 06/05/21 ?1443 06/06/21 ?1540  ?AST 373*  384* 195* 68* 30 27  ?ALT 552*  539* 389* 237* 149* 109*  ?ALKPHOS 188*  188* 172* 159* 128* 134*  ?BILITOT 2.4*  2.2* 1.6* 0.8 0.6 0.5  ?PROT 7.9  8.1 7.3 6.8 6.8 6.8  ?ALBUMIN 4.1  4.2 3.8 3.3* 3.2* 3.1*  ?Felt to be due  to passage of sludge through CBD.  MRCP basically negative.  Improved. ?-Recheck CMP in 1 to 2 weeks ? ? ?Fever ?Likely due to UTI. ? ?Elevated alkaline phosphatase level ?Improved.  Severe transaminitis ? ?Hyperbilirubinemia ?Resolved ? ?Acute renal failure superimposed on stage 3a chronic  kidney disease ([place]) ?Recent Labs  ?  01/30/21 ?1884 02/17/21 ?2158 03/12/21 ?0041 03/13/21 ?1660 03/14/21 ?6301 03/15/21 ?0510 06/02/21 ?1940 06/03/21 ?6010 06/04/21 ?0518 06/05/21 ?0543  ?BUN 15 18 24* '16 19 15 '$ 24* '19 20 18  '$ ?CREATININE 1.33* 1.53* 1.28* 1.18 1.37* 1.15 1.44* 1.15 1.22 1.02  ?AKI resolved. ?-Recheck in 1 to 2 weeks ? ? ?Generalized weakness ?In the setting of acute illness and underlying osteoarthritis and chronic pain. ?-Home health PT/OT and rolling walker ordered. ? ?Chronic diastolic CHF (congestive heart failure) ([place]) ?Appears euvolemic.  Not on diuretics at home. ? ?OSA (obstructive sleep apnea) ?Not compliant with CPAP.  Encouraged to use. ? ?BPH (benign prostatic hyperplasia) ?Continue home Flomax.  Enablex in the state of home Vesicare but not sure if you should be on both medications ? ?Hypertension ?Continue home amlodipine. ? ?Hyperkalemia-resolved as of 06/06/2021 ?Resolved ? ? ? ? ?  ?  ?  ?  ? ?  ? ?Vital signs ?Vitals:  ? 06/05/21 1658 06/05/21 2050 06/06/21 0410 06/06/21 9323  ?BP: (!) 147/68 (!) 141/66 136/76   ?Pulse: 93 96 90   ?Temp: 98.6 ?F (37 ?C) 98.9 ?F (37.2 ?C) (!) 101 ?F (38.3 ?C) 100 ?F (37.8 ?C)  ?Resp:  18 18   ?Height:      ?Weight:      ?SpO2: 97% 96% 94%   ?TempSrc: Oral Oral Oral Oral  ?BMI (Calculated):      ?  ? ?Discharge exam ? ?GENERAL: No apparent distress.  Nontoxic. ?HEENT: MMM.  Vision and hearing grossly intact.  ?NECK: Supple.  No apparent JVD.  ?RESP:  No IWOB.  Fair aeration bilaterally. ?CVS:  RRR. Heart sounds normal.  ?ABD/GI/GU: BS+. Abd soft, NTND.  ?MSK/EXT:  Moves extremities. No apparent deformity.  Trace BLE edema ?SKIN: no apparent skin lesion or wound ?NEURO: Awake and alert. Oriented appropriately.  No apparent focal neuro deficit. ?PSYCH: Calm. Normal affect.  ? ?Discharge Instructions ?Discharge Instructions   ? ? Call MD for:  difficulty breathing, headache or visual disturbances   Complete by: As directed ?  ? Call MD for:  difficulty  breathing, headache or visual disturbances   Complete by: As directed ?  ? Call MD for:  extreme fatigue   Complete by: As directed ?  ? Call MD for:  extreme fatigue   Complete by: As directed ?  ? Call MD for:  persistant dizziness or light-headedness   Complete by: As directed ?  ? Call MD for:  persistant nausea and vomiting   Complete by: As directed ?  ? Call MD for:  persistant nausea and vomiting   Complete by: As directed ?  ? Call MD for:  severe uncontrolled pain   Complete by: As directed ?  ? Call MD for:  severe uncontrolled pain   Complete by: As directed ?  ? Call MD for:  temperature >100.4   Complete by: As directed ?  ? Call MD for:  temperature >100.4   Complete by: As directed ?  ? Diet general   Complete by: As directed ?  ? Discharge instructions   Complete by: As directed ?  ? It has been a pleasure  taking care of you! ? ?You were hospitalized due to elevated liver enzymes that has improved to the point we think it is safe to let you go home and follow-up with your primary care doctor and gastroenterologist.  We have made some adjustment to your pain medication during this hospitalization.  Please review your new medication list and the directions on your medications before you take them. ? ? ?Take care,  ? Discharge instructions   Complete by: As directed ?  ? It has been a pleasure taking care of you! ? ?You were hospitalized due to elevated liver enzymes and pancreas number that has improved.  You also have a UTI for which we have started you on antibiotics.  It is very important that you complete the whole course of antibiotics. ?Please review your new medication list and the directions on your medications before you take them. ?Follow-up with your primary care doctor in 1 to 2 weeks or sooner if needed. ? ? ?Take care,  ? Increase activity slowly   Complete by: As directed ?  ? Increase activity slowly   Complete by: As directed ?  ? ?  ? ?Allergies as of 06/06/2021   ? ?   Reactions  ?  Doxazosin Mesylate   ? Other reaction(s): fatigue  ? Lisinopril   ? Other reaction(s): cough  ? Macrolides And Ketolides   ? Other reaction(s): rash  ? Amoxicillin-pot Clavulanate Rash  ? Codeine Nausea Only  ? ?  ?

## 2021-06-08 DIAGNOSIS — Z85828 Personal history of other malignant neoplasm of skin: Secondary | ICD-10-CM | POA: Diagnosis not present

## 2021-06-08 DIAGNOSIS — M21371 Foot drop, right foot: Secondary | ICD-10-CM | POA: Diagnosis not present

## 2021-06-08 DIAGNOSIS — K859 Acute pancreatitis without necrosis or infection, unspecified: Secondary | ICD-10-CM | POA: Diagnosis not present

## 2021-06-08 DIAGNOSIS — N390 Urinary tract infection, site not specified: Secondary | ICD-10-CM | POA: Diagnosis not present

## 2021-06-08 DIAGNOSIS — G629 Polyneuropathy, unspecified: Secondary | ICD-10-CM | POA: Diagnosis not present

## 2021-06-08 DIAGNOSIS — B964 Proteus (mirabilis) (morganii) as the cause of diseases classified elsewhere: Secondary | ICD-10-CM | POA: Diagnosis not present

## 2021-06-08 DIAGNOSIS — Z87891 Personal history of nicotine dependence: Secondary | ICD-10-CM | POA: Diagnosis not present

## 2021-06-08 DIAGNOSIS — Z79899 Other long term (current) drug therapy: Secondary | ICD-10-CM | POA: Diagnosis not present

## 2021-06-08 DIAGNOSIS — Z9181 History of falling: Secondary | ICD-10-CM | POA: Diagnosis not present

## 2021-06-08 DIAGNOSIS — G4733 Obstructive sleep apnea (adult) (pediatric): Secondary | ICD-10-CM | POA: Diagnosis not present

## 2021-06-08 DIAGNOSIS — K219 Gastro-esophageal reflux disease without esophagitis: Secondary | ICD-10-CM | POA: Diagnosis not present

## 2021-06-08 DIAGNOSIS — M21372 Foot drop, left foot: Secondary | ICD-10-CM | POA: Diagnosis not present

## 2021-06-08 DIAGNOSIS — N1831 Chronic kidney disease, stage 3a: Secondary | ICD-10-CM | POA: Diagnosis not present

## 2021-06-08 DIAGNOSIS — D631 Anemia in chronic kidney disease: Secondary | ICD-10-CM | POA: Diagnosis not present

## 2021-06-08 DIAGNOSIS — Z9981 Dependence on supplemental oxygen: Secondary | ICD-10-CM | POA: Diagnosis not present

## 2021-06-08 DIAGNOSIS — I5032 Chronic diastolic (congestive) heart failure: Secondary | ICD-10-CM | POA: Diagnosis not present

## 2021-06-08 DIAGNOSIS — I130 Hypertensive heart and chronic kidney disease with heart failure and stage 1 through stage 4 chronic kidney disease, or unspecified chronic kidney disease: Secondary | ICD-10-CM | POA: Diagnosis not present

## 2021-06-08 DIAGNOSIS — K760 Fatty (change of) liver, not elsewhere classified: Secondary | ICD-10-CM | POA: Diagnosis not present

## 2021-06-09 DIAGNOSIS — Z79899 Other long term (current) drug therapy: Secondary | ICD-10-CM | POA: Diagnosis not present

## 2021-06-09 DIAGNOSIS — K760 Fatty (change of) liver, not elsewhere classified: Secondary | ICD-10-CM | POA: Diagnosis not present

## 2021-06-09 DIAGNOSIS — D631 Anemia in chronic kidney disease: Secondary | ICD-10-CM | POA: Diagnosis not present

## 2021-06-09 DIAGNOSIS — G4733 Obstructive sleep apnea (adult) (pediatric): Secondary | ICD-10-CM | POA: Diagnosis not present

## 2021-06-09 DIAGNOSIS — Z87891 Personal history of nicotine dependence: Secondary | ICD-10-CM | POA: Diagnosis not present

## 2021-06-09 DIAGNOSIS — K219 Gastro-esophageal reflux disease without esophagitis: Secondary | ICD-10-CM | POA: Diagnosis not present

## 2021-06-09 DIAGNOSIS — I130 Hypertensive heart and chronic kidney disease with heart failure and stage 1 through stage 4 chronic kidney disease, or unspecified chronic kidney disease: Secondary | ICD-10-CM | POA: Diagnosis not present

## 2021-06-09 DIAGNOSIS — I5032 Chronic diastolic (congestive) heart failure: Secondary | ICD-10-CM | POA: Diagnosis not present

## 2021-06-09 DIAGNOSIS — N1831 Chronic kidney disease, stage 3a: Secondary | ICD-10-CM | POA: Diagnosis not present

## 2021-06-09 DIAGNOSIS — Z9981 Dependence on supplemental oxygen: Secondary | ICD-10-CM | POA: Diagnosis not present

## 2021-06-09 DIAGNOSIS — G629 Polyneuropathy, unspecified: Secondary | ICD-10-CM | POA: Diagnosis not present

## 2021-06-09 DIAGNOSIS — Z85828 Personal history of other malignant neoplasm of skin: Secondary | ICD-10-CM | POA: Diagnosis not present

## 2021-06-09 DIAGNOSIS — Z9181 History of falling: Secondary | ICD-10-CM | POA: Diagnosis not present

## 2021-06-09 DIAGNOSIS — B964 Proteus (mirabilis) (morganii) as the cause of diseases classified elsewhere: Secondary | ICD-10-CM | POA: Diagnosis not present

## 2021-06-09 DIAGNOSIS — M21372 Foot drop, left foot: Secondary | ICD-10-CM | POA: Diagnosis not present

## 2021-06-09 DIAGNOSIS — M21371 Foot drop, right foot: Secondary | ICD-10-CM | POA: Diagnosis not present

## 2021-06-09 DIAGNOSIS — N390 Urinary tract infection, site not specified: Secondary | ICD-10-CM | POA: Diagnosis not present

## 2021-06-09 DIAGNOSIS — K859 Acute pancreatitis without necrosis or infection, unspecified: Secondary | ICD-10-CM | POA: Diagnosis not present

## 2021-06-09 LAB — CULTURE, BLOOD (ROUTINE X 2)
Culture: NO GROWTH
Culture: NO GROWTH
Special Requests: ADEQUATE
Special Requests: ADEQUATE

## 2021-06-13 ENCOUNTER — Other Ambulatory Visit: Payer: Self-pay | Admitting: Gastroenterology

## 2021-06-13 DIAGNOSIS — Z79899 Other long term (current) drug therapy: Secondary | ICD-10-CM | POA: Diagnosis not present

## 2021-06-13 DIAGNOSIS — K760 Fatty (change of) liver, not elsewhere classified: Secondary | ICD-10-CM | POA: Diagnosis not present

## 2021-06-13 DIAGNOSIS — Z9181 History of falling: Secondary | ICD-10-CM | POA: Diagnosis not present

## 2021-06-13 DIAGNOSIS — Z87891 Personal history of nicotine dependence: Secondary | ICD-10-CM | POA: Diagnosis not present

## 2021-06-13 DIAGNOSIS — N390 Urinary tract infection, site not specified: Secondary | ICD-10-CM | POA: Diagnosis not present

## 2021-06-13 DIAGNOSIS — Z85828 Personal history of other malignant neoplasm of skin: Secondary | ICD-10-CM | POA: Diagnosis not present

## 2021-06-13 DIAGNOSIS — K859 Acute pancreatitis without necrosis or infection, unspecified: Secondary | ICD-10-CM | POA: Diagnosis not present

## 2021-06-13 DIAGNOSIS — I130 Hypertensive heart and chronic kidney disease with heart failure and stage 1 through stage 4 chronic kidney disease, or unspecified chronic kidney disease: Secondary | ICD-10-CM | POA: Diagnosis not present

## 2021-06-13 DIAGNOSIS — M21372 Foot drop, left foot: Secondary | ICD-10-CM | POA: Diagnosis not present

## 2021-06-13 DIAGNOSIS — B964 Proteus (mirabilis) (morganii) as the cause of diseases classified elsewhere: Secondary | ICD-10-CM | POA: Diagnosis not present

## 2021-06-13 DIAGNOSIS — K851 Biliary acute pancreatitis without necrosis or infection: Secondary | ICD-10-CM | POA: Diagnosis not present

## 2021-06-13 DIAGNOSIS — N1831 Chronic kidney disease, stage 3a: Secondary | ICD-10-CM | POA: Diagnosis not present

## 2021-06-13 DIAGNOSIS — G629 Polyneuropathy, unspecified: Secondary | ICD-10-CM | POA: Diagnosis not present

## 2021-06-13 DIAGNOSIS — Z9981 Dependence on supplemental oxygen: Secondary | ICD-10-CM | POA: Diagnosis not present

## 2021-06-13 DIAGNOSIS — M21371 Foot drop, right foot: Secondary | ICD-10-CM | POA: Diagnosis not present

## 2021-06-13 DIAGNOSIS — I5032 Chronic diastolic (congestive) heart failure: Secondary | ICD-10-CM | POA: Diagnosis not present

## 2021-06-13 DIAGNOSIS — G4733 Obstructive sleep apnea (adult) (pediatric): Secondary | ICD-10-CM | POA: Diagnosis not present

## 2021-06-13 DIAGNOSIS — K219 Gastro-esophageal reflux disease without esophagitis: Secondary | ICD-10-CM | POA: Diagnosis not present

## 2021-06-13 DIAGNOSIS — D631 Anemia in chronic kidney disease: Secondary | ICD-10-CM | POA: Diagnosis not present

## 2021-06-16 DIAGNOSIS — Z79899 Other long term (current) drug therapy: Secondary | ICD-10-CM | POA: Diagnosis not present

## 2021-06-16 DIAGNOSIS — I5032 Chronic diastolic (congestive) heart failure: Secondary | ICD-10-CM | POA: Diagnosis not present

## 2021-06-16 DIAGNOSIS — Z85828 Personal history of other malignant neoplasm of skin: Secondary | ICD-10-CM | POA: Diagnosis not present

## 2021-06-16 DIAGNOSIS — I130 Hypertensive heart and chronic kidney disease with heart failure and stage 1 through stage 4 chronic kidney disease, or unspecified chronic kidney disease: Secondary | ICD-10-CM | POA: Diagnosis not present

## 2021-06-16 DIAGNOSIS — D631 Anemia in chronic kidney disease: Secondary | ICD-10-CM | POA: Diagnosis not present

## 2021-06-16 DIAGNOSIS — Z9181 History of falling: Secondary | ICD-10-CM | POA: Diagnosis not present

## 2021-06-16 DIAGNOSIS — N390 Urinary tract infection, site not specified: Secondary | ICD-10-CM | POA: Diagnosis not present

## 2021-06-16 DIAGNOSIS — Z87891 Personal history of nicotine dependence: Secondary | ICD-10-CM | POA: Diagnosis not present

## 2021-06-16 DIAGNOSIS — M21371 Foot drop, right foot: Secondary | ICD-10-CM | POA: Diagnosis not present

## 2021-06-16 DIAGNOSIS — B964 Proteus (mirabilis) (morganii) as the cause of diseases classified elsewhere: Secondary | ICD-10-CM | POA: Diagnosis not present

## 2021-06-16 DIAGNOSIS — K859 Acute pancreatitis without necrosis or infection, unspecified: Secondary | ICD-10-CM | POA: Diagnosis not present

## 2021-06-16 DIAGNOSIS — M21372 Foot drop, left foot: Secondary | ICD-10-CM | POA: Diagnosis not present

## 2021-06-16 DIAGNOSIS — Z9981 Dependence on supplemental oxygen: Secondary | ICD-10-CM | POA: Diagnosis not present

## 2021-06-16 DIAGNOSIS — K219 Gastro-esophageal reflux disease without esophagitis: Secondary | ICD-10-CM | POA: Diagnosis not present

## 2021-06-16 DIAGNOSIS — Z8719 Personal history of other diseases of the digestive system: Secondary | ICD-10-CM | POA: Diagnosis not present

## 2021-06-16 DIAGNOSIS — K760 Fatty (change of) liver, not elsewhere classified: Secondary | ICD-10-CM | POA: Diagnosis not present

## 2021-06-16 DIAGNOSIS — N1831 Chronic kidney disease, stage 3a: Secondary | ICD-10-CM | POA: Diagnosis not present

## 2021-06-16 DIAGNOSIS — G629 Polyneuropathy, unspecified: Secondary | ICD-10-CM | POA: Diagnosis not present

## 2021-06-16 DIAGNOSIS — G4733 Obstructive sleep apnea (adult) (pediatric): Secondary | ICD-10-CM | POA: Diagnosis not present

## 2021-06-17 DIAGNOSIS — K859 Acute pancreatitis without necrosis or infection, unspecified: Secondary | ICD-10-CM | POA: Diagnosis not present

## 2021-06-17 DIAGNOSIS — G4733 Obstructive sleep apnea (adult) (pediatric): Secondary | ICD-10-CM | POA: Diagnosis not present

## 2021-06-17 DIAGNOSIS — Z79899 Other long term (current) drug therapy: Secondary | ICD-10-CM | POA: Diagnosis not present

## 2021-06-17 DIAGNOSIS — R350 Frequency of micturition: Secondary | ICD-10-CM | POA: Diagnosis not present

## 2021-06-17 DIAGNOSIS — N1831 Chronic kidney disease, stage 3a: Secondary | ICD-10-CM | POA: Diagnosis not present

## 2021-06-17 DIAGNOSIS — N390 Urinary tract infection, site not specified: Secondary | ICD-10-CM | POA: Diagnosis not present

## 2021-06-17 DIAGNOSIS — G629 Polyneuropathy, unspecified: Secondary | ICD-10-CM | POA: Diagnosis not present

## 2021-06-17 DIAGNOSIS — B964 Proteus (mirabilis) (morganii) as the cause of diseases classified elsewhere: Secondary | ICD-10-CM | POA: Diagnosis not present

## 2021-06-17 DIAGNOSIS — Z9981 Dependence on supplemental oxygen: Secondary | ICD-10-CM | POA: Diagnosis not present

## 2021-06-17 DIAGNOSIS — Z85828 Personal history of other malignant neoplasm of skin: Secondary | ICD-10-CM | POA: Diagnosis not present

## 2021-06-17 DIAGNOSIS — M21372 Foot drop, left foot: Secondary | ICD-10-CM | POA: Diagnosis not present

## 2021-06-17 DIAGNOSIS — Z9181 History of falling: Secondary | ICD-10-CM | POA: Diagnosis not present

## 2021-06-17 DIAGNOSIS — I5032 Chronic diastolic (congestive) heart failure: Secondary | ICD-10-CM | POA: Diagnosis not present

## 2021-06-17 DIAGNOSIS — M21371 Foot drop, right foot: Secondary | ICD-10-CM | POA: Diagnosis not present

## 2021-06-17 DIAGNOSIS — D631 Anemia in chronic kidney disease: Secondary | ICD-10-CM | POA: Diagnosis not present

## 2021-06-17 DIAGNOSIS — K219 Gastro-esophageal reflux disease without esophagitis: Secondary | ICD-10-CM | POA: Diagnosis not present

## 2021-06-17 DIAGNOSIS — I130 Hypertensive heart and chronic kidney disease with heart failure and stage 1 through stage 4 chronic kidney disease, or unspecified chronic kidney disease: Secondary | ICD-10-CM | POA: Diagnosis not present

## 2021-06-17 DIAGNOSIS — K760 Fatty (change of) liver, not elsewhere classified: Secondary | ICD-10-CM | POA: Diagnosis not present

## 2021-06-17 DIAGNOSIS — Z87891 Personal history of nicotine dependence: Secondary | ICD-10-CM | POA: Diagnosis not present

## 2021-06-21 DIAGNOSIS — K859 Acute pancreatitis without necrosis or infection, unspecified: Secondary | ICD-10-CM | POA: Diagnosis not present

## 2021-06-21 DIAGNOSIS — M21372 Foot drop, left foot: Secondary | ICD-10-CM | POA: Diagnosis not present

## 2021-06-21 DIAGNOSIS — Z79899 Other long term (current) drug therapy: Secondary | ICD-10-CM | POA: Diagnosis not present

## 2021-06-21 DIAGNOSIS — N390 Urinary tract infection, site not specified: Secondary | ICD-10-CM | POA: Diagnosis not present

## 2021-06-21 DIAGNOSIS — G629 Polyneuropathy, unspecified: Secondary | ICD-10-CM | POA: Diagnosis not present

## 2021-06-21 DIAGNOSIS — Z9181 History of falling: Secondary | ICD-10-CM | POA: Diagnosis not present

## 2021-06-21 DIAGNOSIS — D631 Anemia in chronic kidney disease: Secondary | ICD-10-CM | POA: Diagnosis not present

## 2021-06-21 DIAGNOSIS — K219 Gastro-esophageal reflux disease without esophagitis: Secondary | ICD-10-CM | POA: Diagnosis not present

## 2021-06-21 DIAGNOSIS — B964 Proteus (mirabilis) (morganii) as the cause of diseases classified elsewhere: Secondary | ICD-10-CM | POA: Diagnosis not present

## 2021-06-21 DIAGNOSIS — Z85828 Personal history of other malignant neoplasm of skin: Secondary | ICD-10-CM | POA: Diagnosis not present

## 2021-06-21 DIAGNOSIS — K760 Fatty (change of) liver, not elsewhere classified: Secondary | ICD-10-CM | POA: Diagnosis not present

## 2021-06-21 DIAGNOSIS — Z9981 Dependence on supplemental oxygen: Secondary | ICD-10-CM | POA: Diagnosis not present

## 2021-06-21 DIAGNOSIS — M21371 Foot drop, right foot: Secondary | ICD-10-CM | POA: Diagnosis not present

## 2021-06-21 DIAGNOSIS — N1831 Chronic kidney disease, stage 3a: Secondary | ICD-10-CM | POA: Diagnosis not present

## 2021-06-21 DIAGNOSIS — I5032 Chronic diastolic (congestive) heart failure: Secondary | ICD-10-CM | POA: Diagnosis not present

## 2021-06-21 DIAGNOSIS — I130 Hypertensive heart and chronic kidney disease with heart failure and stage 1 through stage 4 chronic kidney disease, or unspecified chronic kidney disease: Secondary | ICD-10-CM | POA: Diagnosis not present

## 2021-06-21 DIAGNOSIS — G4733 Obstructive sleep apnea (adult) (pediatric): Secondary | ICD-10-CM | POA: Diagnosis not present

## 2021-06-21 DIAGNOSIS — Z87891 Personal history of nicotine dependence: Secondary | ICD-10-CM | POA: Diagnosis not present

## 2021-06-23 DIAGNOSIS — Z79899 Other long term (current) drug therapy: Secondary | ICD-10-CM | POA: Diagnosis not present

## 2021-06-23 DIAGNOSIS — N390 Urinary tract infection, site not specified: Secondary | ICD-10-CM | POA: Diagnosis not present

## 2021-06-23 DIAGNOSIS — M21371 Foot drop, right foot: Secondary | ICD-10-CM | POA: Diagnosis not present

## 2021-06-23 DIAGNOSIS — K219 Gastro-esophageal reflux disease without esophagitis: Secondary | ICD-10-CM | POA: Diagnosis not present

## 2021-06-23 DIAGNOSIS — Z85828 Personal history of other malignant neoplasm of skin: Secondary | ICD-10-CM | POA: Diagnosis not present

## 2021-06-23 DIAGNOSIS — I5032 Chronic diastolic (congestive) heart failure: Secondary | ICD-10-CM | POA: Diagnosis not present

## 2021-06-23 DIAGNOSIS — K760 Fatty (change of) liver, not elsewhere classified: Secondary | ICD-10-CM | POA: Diagnosis not present

## 2021-06-23 DIAGNOSIS — Z9181 History of falling: Secondary | ICD-10-CM | POA: Diagnosis not present

## 2021-06-23 DIAGNOSIS — D631 Anemia in chronic kidney disease: Secondary | ICD-10-CM | POA: Diagnosis not present

## 2021-06-23 DIAGNOSIS — I130 Hypertensive heart and chronic kidney disease with heart failure and stage 1 through stage 4 chronic kidney disease, or unspecified chronic kidney disease: Secondary | ICD-10-CM | POA: Diagnosis not present

## 2021-06-23 DIAGNOSIS — Z9981 Dependence on supplemental oxygen: Secondary | ICD-10-CM | POA: Diagnosis not present

## 2021-06-23 DIAGNOSIS — N1831 Chronic kidney disease, stage 3a: Secondary | ICD-10-CM | POA: Diagnosis not present

## 2021-06-23 DIAGNOSIS — M21372 Foot drop, left foot: Secondary | ICD-10-CM | POA: Diagnosis not present

## 2021-06-23 DIAGNOSIS — G629 Polyneuropathy, unspecified: Secondary | ICD-10-CM | POA: Diagnosis not present

## 2021-06-23 DIAGNOSIS — G4733 Obstructive sleep apnea (adult) (pediatric): Secondary | ICD-10-CM | POA: Diagnosis not present

## 2021-06-23 DIAGNOSIS — Z87891 Personal history of nicotine dependence: Secondary | ICD-10-CM | POA: Diagnosis not present

## 2021-06-23 DIAGNOSIS — B964 Proteus (mirabilis) (morganii) as the cause of diseases classified elsewhere: Secondary | ICD-10-CM | POA: Diagnosis not present

## 2021-06-23 DIAGNOSIS — K859 Acute pancreatitis without necrosis or infection, unspecified: Secondary | ICD-10-CM | POA: Diagnosis not present

## 2021-06-27 DIAGNOSIS — K219 Gastro-esophageal reflux disease without esophagitis: Secondary | ICD-10-CM | POA: Diagnosis not present

## 2021-06-27 DIAGNOSIS — M21372 Foot drop, left foot: Secondary | ICD-10-CM | POA: Diagnosis not present

## 2021-06-27 DIAGNOSIS — I130 Hypertensive heart and chronic kidney disease with heart failure and stage 1 through stage 4 chronic kidney disease, or unspecified chronic kidney disease: Secondary | ICD-10-CM | POA: Diagnosis not present

## 2021-06-27 DIAGNOSIS — M21371 Foot drop, right foot: Secondary | ICD-10-CM | POA: Diagnosis not present

## 2021-06-27 DIAGNOSIS — G4733 Obstructive sleep apnea (adult) (pediatric): Secondary | ICD-10-CM | POA: Diagnosis not present

## 2021-06-27 DIAGNOSIS — K859 Acute pancreatitis without necrosis or infection, unspecified: Secondary | ICD-10-CM | POA: Diagnosis not present

## 2021-06-27 DIAGNOSIS — B964 Proteus (mirabilis) (morganii) as the cause of diseases classified elsewhere: Secondary | ICD-10-CM | POA: Diagnosis not present

## 2021-06-27 DIAGNOSIS — Z9981 Dependence on supplemental oxygen: Secondary | ICD-10-CM | POA: Diagnosis not present

## 2021-06-27 DIAGNOSIS — Z87891 Personal history of nicotine dependence: Secondary | ICD-10-CM | POA: Diagnosis not present

## 2021-06-27 DIAGNOSIS — N390 Urinary tract infection, site not specified: Secondary | ICD-10-CM | POA: Diagnosis not present

## 2021-06-27 DIAGNOSIS — Z9181 History of falling: Secondary | ICD-10-CM | POA: Diagnosis not present

## 2021-06-27 DIAGNOSIS — Z79899 Other long term (current) drug therapy: Secondary | ICD-10-CM | POA: Diagnosis not present

## 2021-06-27 DIAGNOSIS — D631 Anemia in chronic kidney disease: Secondary | ICD-10-CM | POA: Diagnosis not present

## 2021-06-27 DIAGNOSIS — K760 Fatty (change of) liver, not elsewhere classified: Secondary | ICD-10-CM | POA: Diagnosis not present

## 2021-06-27 DIAGNOSIS — I5032 Chronic diastolic (congestive) heart failure: Secondary | ICD-10-CM | POA: Diagnosis not present

## 2021-06-27 DIAGNOSIS — Z85828 Personal history of other malignant neoplasm of skin: Secondary | ICD-10-CM | POA: Diagnosis not present

## 2021-06-27 DIAGNOSIS — N1831 Chronic kidney disease, stage 3a: Secondary | ICD-10-CM | POA: Diagnosis not present

## 2021-06-27 DIAGNOSIS — G629 Polyneuropathy, unspecified: Secondary | ICD-10-CM | POA: Diagnosis not present

## 2021-06-29 ENCOUNTER — Encounter (HOSPITAL_COMMUNITY): Payer: Self-pay | Admitting: Gastroenterology

## 2021-06-29 DIAGNOSIS — N1831 Chronic kidney disease, stage 3a: Secondary | ICD-10-CM | POA: Diagnosis not present

## 2021-06-29 DIAGNOSIS — K219 Gastro-esophageal reflux disease without esophagitis: Secondary | ICD-10-CM | POA: Diagnosis not present

## 2021-06-29 DIAGNOSIS — I5032 Chronic diastolic (congestive) heart failure: Secondary | ICD-10-CM | POA: Diagnosis not present

## 2021-06-29 DIAGNOSIS — M21371 Foot drop, right foot: Secondary | ICD-10-CM | POA: Diagnosis not present

## 2021-06-29 DIAGNOSIS — I10 Essential (primary) hypertension: Secondary | ICD-10-CM | POA: Diagnosis not present

## 2021-06-29 DIAGNOSIS — M21372 Foot drop, left foot: Secondary | ICD-10-CM | POA: Diagnosis not present

## 2021-06-29 DIAGNOSIS — B964 Proteus (mirabilis) (morganii) as the cause of diseases classified elsewhere: Secondary | ICD-10-CM | POA: Diagnosis not present

## 2021-06-29 DIAGNOSIS — K859 Acute pancreatitis without necrosis or infection, unspecified: Secondary | ICD-10-CM | POA: Diagnosis not present

## 2021-06-29 DIAGNOSIS — G629 Polyneuropathy, unspecified: Secondary | ICD-10-CM | POA: Diagnosis not present

## 2021-06-29 DIAGNOSIS — Z79899 Other long term (current) drug therapy: Secondary | ICD-10-CM | POA: Diagnosis not present

## 2021-06-29 DIAGNOSIS — N390 Urinary tract infection, site not specified: Secondary | ICD-10-CM | POA: Diagnosis not present

## 2021-06-29 DIAGNOSIS — Z9981 Dependence on supplemental oxygen: Secondary | ICD-10-CM | POA: Diagnosis not present

## 2021-06-29 DIAGNOSIS — Z9181 History of falling: Secondary | ICD-10-CM | POA: Diagnosis not present

## 2021-06-29 DIAGNOSIS — D631 Anemia in chronic kidney disease: Secondary | ICD-10-CM | POA: Diagnosis not present

## 2021-06-29 DIAGNOSIS — G8929 Other chronic pain: Secondary | ICD-10-CM | POA: Diagnosis not present

## 2021-06-29 DIAGNOSIS — K760 Fatty (change of) liver, not elsewhere classified: Secondary | ICD-10-CM | POA: Diagnosis not present

## 2021-06-29 DIAGNOSIS — E782 Mixed hyperlipidemia: Secondary | ICD-10-CM | POA: Diagnosis not present

## 2021-06-29 DIAGNOSIS — G4733 Obstructive sleep apnea (adult) (pediatric): Secondary | ICD-10-CM | POA: Diagnosis not present

## 2021-06-29 DIAGNOSIS — I130 Hypertensive heart and chronic kidney disease with heart failure and stage 1 through stage 4 chronic kidney disease, or unspecified chronic kidney disease: Secondary | ICD-10-CM | POA: Diagnosis not present

## 2021-06-29 DIAGNOSIS — Z87891 Personal history of nicotine dependence: Secondary | ICD-10-CM | POA: Diagnosis not present

## 2021-06-29 DIAGNOSIS — Z85828 Personal history of other malignant neoplasm of skin: Secondary | ICD-10-CM | POA: Diagnosis not present

## 2021-06-30 NOTE — Progress Notes (Signed)
Attempted to obtain medical history via telephone, unable to reach at this time. I left a voicemail to return pre surgical testing department's phone call.  

## 2021-07-01 DIAGNOSIS — Z85828 Personal history of other malignant neoplasm of skin: Secondary | ICD-10-CM | POA: Diagnosis not present

## 2021-07-01 DIAGNOSIS — K760 Fatty (change of) liver, not elsewhere classified: Secondary | ICD-10-CM | POA: Diagnosis not present

## 2021-07-01 DIAGNOSIS — Z9181 History of falling: Secondary | ICD-10-CM | POA: Diagnosis not present

## 2021-07-01 DIAGNOSIS — M21372 Foot drop, left foot: Secondary | ICD-10-CM | POA: Diagnosis not present

## 2021-07-01 DIAGNOSIS — I5032 Chronic diastolic (congestive) heart failure: Secondary | ICD-10-CM | POA: Diagnosis not present

## 2021-07-01 DIAGNOSIS — Z87891 Personal history of nicotine dependence: Secondary | ICD-10-CM | POA: Diagnosis not present

## 2021-07-01 DIAGNOSIS — Z79899 Other long term (current) drug therapy: Secondary | ICD-10-CM | POA: Diagnosis not present

## 2021-07-01 DIAGNOSIS — N1831 Chronic kidney disease, stage 3a: Secondary | ICD-10-CM | POA: Diagnosis not present

## 2021-07-01 DIAGNOSIS — G629 Polyneuropathy, unspecified: Secondary | ICD-10-CM | POA: Diagnosis not present

## 2021-07-01 DIAGNOSIS — B964 Proteus (mirabilis) (morganii) as the cause of diseases classified elsewhere: Secondary | ICD-10-CM | POA: Diagnosis not present

## 2021-07-01 DIAGNOSIS — I130 Hypertensive heart and chronic kidney disease with heart failure and stage 1 through stage 4 chronic kidney disease, or unspecified chronic kidney disease: Secondary | ICD-10-CM | POA: Diagnosis not present

## 2021-07-01 DIAGNOSIS — N390 Urinary tract infection, site not specified: Secondary | ICD-10-CM | POA: Diagnosis not present

## 2021-07-01 DIAGNOSIS — G4733 Obstructive sleep apnea (adult) (pediatric): Secondary | ICD-10-CM | POA: Diagnosis not present

## 2021-07-01 DIAGNOSIS — Z9981 Dependence on supplemental oxygen: Secondary | ICD-10-CM | POA: Diagnosis not present

## 2021-07-01 DIAGNOSIS — M21371 Foot drop, right foot: Secondary | ICD-10-CM | POA: Diagnosis not present

## 2021-07-01 DIAGNOSIS — K859 Acute pancreatitis without necrosis or infection, unspecified: Secondary | ICD-10-CM | POA: Diagnosis not present

## 2021-07-01 DIAGNOSIS — K219 Gastro-esophageal reflux disease without esophagitis: Secondary | ICD-10-CM | POA: Diagnosis not present

## 2021-07-01 DIAGNOSIS — D631 Anemia in chronic kidney disease: Secondary | ICD-10-CM | POA: Diagnosis not present

## 2021-07-05 ENCOUNTER — Ambulatory Visit (HOSPITAL_BASED_OUTPATIENT_CLINIC_OR_DEPARTMENT_OTHER): Payer: Medicare Other | Admitting: Anesthesiology

## 2021-07-05 ENCOUNTER — Other Ambulatory Visit: Payer: Self-pay

## 2021-07-05 ENCOUNTER — Ambulatory Visit (HOSPITAL_COMMUNITY): Payer: Medicare Other | Admitting: Anesthesiology

## 2021-07-05 ENCOUNTER — Ambulatory Visit (HOSPITAL_COMMUNITY)
Admission: RE | Admit: 2021-07-05 | Discharge: 2021-07-05 | Disposition: A | Discharge status: Home / Self Care | Payer: Medicare Other | Attending: Gastroenterology | Admitting: Gastroenterology

## 2021-07-05 ENCOUNTER — Ambulatory Visit (HOSPITAL_COMMUNITY): Payer: Medicare Other

## 2021-07-05 ENCOUNTER — Encounter (HOSPITAL_COMMUNITY)
Admission: RE | Disposition: A | Discharge status: Home / Self Care | Payer: Self-pay | Source: Home / Self Care | Attending: Gastroenterology

## 2021-07-05 ENCOUNTER — Encounter (HOSPITAL_COMMUNITY): Payer: Self-pay | Admitting: Gastroenterology

## 2021-07-05 DIAGNOSIS — K760 Fatty (change of) liver, not elsewhere classified: Secondary | ICD-10-CM | POA: Diagnosis not present

## 2021-07-05 DIAGNOSIS — N189 Chronic kidney disease, unspecified: Secondary | ICD-10-CM

## 2021-07-05 DIAGNOSIS — Z9989 Dependence on other enabling machines and devices: Secondary | ICD-10-CM | POA: Diagnosis not present

## 2021-07-05 DIAGNOSIS — N400 Enlarged prostate without lower urinary tract symptoms: Secondary | ICD-10-CM | POA: Insufficient documentation

## 2021-07-05 DIAGNOSIS — E785 Hyperlipidemia, unspecified: Secondary | ICD-10-CM | POA: Diagnosis not present

## 2021-07-05 DIAGNOSIS — N1831 Chronic kidney disease, stage 3a: Secondary | ICD-10-CM | POA: Diagnosis not present

## 2021-07-05 DIAGNOSIS — Z9049 Acquired absence of other specified parts of digestive tract: Secondary | ICD-10-CM | POA: Insufficient documentation

## 2021-07-05 DIAGNOSIS — G4733 Obstructive sleep apnea (adult) (pediatric): Secondary | ICD-10-CM | POA: Diagnosis not present

## 2021-07-05 DIAGNOSIS — K851 Biliary acute pancreatitis without necrosis or infection: Secondary | ICD-10-CM | POA: Insufficient documentation

## 2021-07-05 DIAGNOSIS — K219 Gastro-esophageal reflux disease without esophagitis: Secondary | ICD-10-CM | POA: Insufficient documentation

## 2021-07-05 DIAGNOSIS — I129 Hypertensive chronic kidney disease with stage 1 through stage 4 chronic kidney disease, or unspecified chronic kidney disease: Secondary | ICD-10-CM

## 2021-07-05 DIAGNOSIS — K8050 Calculus of bile duct without cholangitis or cholecystitis without obstruction: Secondary | ICD-10-CM | POA: Diagnosis not present

## 2021-07-05 DIAGNOSIS — K7689 Other specified diseases of liver: Secondary | ICD-10-CM | POA: Diagnosis not present

## 2021-07-05 DIAGNOSIS — Z87891 Personal history of nicotine dependence: Secondary | ICD-10-CM

## 2021-07-05 DIAGNOSIS — I130 Hypertensive heart and chronic kidney disease with heart failure and stage 1 through stage 4 chronic kidney disease, or unspecified chronic kidney disease: Secondary | ICD-10-CM | POA: Diagnosis not present

## 2021-07-05 DIAGNOSIS — K859 Acute pancreatitis without necrosis or infection, unspecified: Secondary | ICD-10-CM

## 2021-07-05 DIAGNOSIS — Z0389 Encounter for observation for other suspected diseases and conditions ruled out: Secondary | ICD-10-CM | POA: Diagnosis not present

## 2021-07-05 HISTORY — PX: REMOVAL OF STONES: SHX5545

## 2021-07-05 HISTORY — PX: ENDOSCOPIC RETROGRADE CHOLANGIOPANCREATOGRAPHY (ERCP) WITH PROPOFOL: SHX5810

## 2021-07-05 HISTORY — PX: SPHINCTEROTOMY: SHX5279

## 2021-07-05 SURGERY — ENDOSCOPIC RETROGRADE CHOLANGIOPANCREATOGRAPHY (ERCP) WITH PROPOFOL
Anesthesia: General

## 2021-07-05 MED ORDER — LACTATED RINGERS IV SOLN: INTRAVENOUS | Status: DC

## 2021-07-05 MED ORDER — INDOMETHACIN 50 MG RE SUPP
RECTAL | Status: AC
  Filled 2021-07-05: qty 2

## 2021-07-05 MED ORDER — LIDOCAINE HCL (CARDIAC) PF 100 MG/5ML IV SOSY
PREFILLED_SYRINGE | INTRAVENOUS | Status: DC | PRN
  Administered 2021-07-05: 40 mg via INTRAVENOUS

## 2021-07-05 MED ORDER — CIPROFLOXACIN IN D5W 400 MG/200ML IV SOLN
INTRAVENOUS | Status: DC | PRN
  Administered 2021-07-05: 400 mg via INTRAVENOUS

## 2021-07-05 MED ORDER — LACTATED RINGERS IV SOLN
INTRAVENOUS | Status: DC | PRN
Start: 2021-07-05 — End: 2021-07-05

## 2021-07-05 MED ORDER — INDOMETHACIN 50 MG RE SUPP
RECTAL | Status: DC | PRN
  Administered 2021-07-05: 100 mg via RECTAL

## 2021-07-05 MED ORDER — GLUCAGON HCL RDNA (DIAGNOSTIC) 1 MG IJ SOLR
INTRAMUSCULAR | Status: DC | PRN
  Administered 2021-07-05: .5 mg via INTRAVENOUS

## 2021-07-05 MED ORDER — ESMOLOL HCL 100 MG/10ML IV SOLN
INTRAVENOUS | Status: DC | PRN
  Administered 2021-07-05: 30 mg via INTRAVENOUS

## 2021-07-05 MED ORDER — ONDANSETRON HCL 4 MG/2ML IJ SOLN: 4.0000 mg | Freq: Once | INTRAMUSCULAR | Status: DC | PRN

## 2021-07-05 MED ORDER — PHENYLEPHRINE 80 MCG/ML (10ML) SYRINGE FOR IV PUSH (FOR BLOOD PRESSURE SUPPORT)
PREFILLED_SYRINGE | INTRAVENOUS | Status: DC | PRN
  Administered 2021-07-05: 80 ug via INTRAVENOUS
  Administered 2021-07-05: 240 ug via INTRAVENOUS

## 2021-07-05 MED ORDER — ONDANSETRON HCL 4 MG/2ML IJ SOLN
INTRAMUSCULAR | Status: DC | PRN
  Administered 2021-07-05: 4 mg via INTRAVENOUS

## 2021-07-05 MED ORDER — ROCURONIUM BROMIDE 100 MG/10ML IV SOLN
INTRAVENOUS | Status: DC | PRN
  Administered 2021-07-05: 50 mg via INTRAVENOUS

## 2021-07-05 MED ORDER — PROPOFOL 10 MG/ML IV BOLUS
INTRAVENOUS | Status: DC | PRN
  Administered 2021-07-05: 70 mg via INTRAVENOUS

## 2021-07-05 MED ORDER — SODIUM CHLORIDE 0.9 % IV SOLN: INTRAVENOUS | Status: DC

## 2021-07-05 MED ORDER — PROPOFOL 500 MG/50ML IV EMUL
INTRAVENOUS | Status: DC | PRN
  Administered 2021-07-05: 125 ug/kg/min via INTRAVENOUS

## 2021-07-05 MED ORDER — SODIUM CHLORIDE 0.9 % IV SOLN
INTRAVENOUS | Status: DC | PRN
  Administered 2021-07-05: 20 mL

## 2021-07-05 MED ORDER — GLUCAGON HCL RDNA (DIAGNOSTIC) 1 MG IJ SOLR
INTRAMUSCULAR | Status: AC
  Filled 2021-07-05: qty 2

## 2021-07-05 MED ORDER — CIPROFLOXACIN IN D5W 400 MG/200ML IV SOLN
INTRAVENOUS | Status: AC
  Filled 2021-07-05: qty 200

## 2021-07-05 MED ORDER — SUGAMMADEX SODIUM 200 MG/2ML IV SOLN
INTRAVENOUS | Status: DC | PRN
Start: 2021-07-05 — End: 2021-07-05
  Administered 2021-07-05: 200 mg via INTRAVENOUS

## 2021-07-05 NOTE — Transfer of Care (Signed)
Immediate Anesthesia Transfer of Care Note ? ?Patient: ARYAV WIMBERLY ? ?Procedure(s) Performed: ENDOSCOPIC RETROGRADE CHOLANGIOPANCREATOGRAPHY (ERCP) WITH PROPOFOL ?SPHINCTEROTOMY ?REMOVAL OF STONES ? ?Patient Location: PACU and Endoscopy Unit ? ?Anesthesia Type:General ? ?Level of Consciousness: awake, alert , oriented and patient cooperative ? ?Airway & Oxygen Therapy: Patient Spontanous Breathing and Patient connected to face mask oxygen ? ?Post-op Assessment: Report given to RN, Post -op Vital signs reviewed and stable and Patient moving all extremities ? ?Post vital signs: Reviewed and stable ? ?Last Vitals:  ?Vitals Value Taken Time  ?BP    ?Temp    ?Pulse    ?Resp    ?SpO2    ? ? ?Last Pain:  ?Vitals:  ? 07/05/21 1035  ?TempSrc: Temporal  ?PainSc: 0-No pain  ?   ? ?  ? ?Complications: No notable events documented. ?

## 2021-07-05 NOTE — Discharge Instructions (Signed)

## 2021-07-05 NOTE — Anesthesia Procedure Notes (Signed)
Procedure Name: Intubation ?Date/Time: 07/05/2021 11:08 AM ?Performed by: [place] Dnya Hickle C, CRNA ?Pre-anesthesia Checklist: Patient identified, Emergency Drugs available, Suction available and Patient being monitored ?Patient Re-evaluated:Patient Re-evaluated prior to induction ?Oxygen Delivery Method: Circle system utilized ?Preoxygenation: Pre-oxygenation with 100% oxygen ?Induction Type: IV induction ?Ventilation: Mask ventilation without difficulty ?Laryngoscope Size: Mac and 4 ?Grade View: Grade I ?Tube type: Oral ?Tube size: 7.5 mm ?Number of attempts: 1 ?Airway Equipment and Method: Stylet and Oral airway ?Placement Confirmation: ETT inserted through vocal cords under direct vision, positive ETCO2 and breath sounds checked- equal and bilateral ?Secured at: 22 cm ?Tube secured with: Tape ?Dental Injury: Teeth and Oropharynx as per pre-operative assessment  ? ? ? ? ?

## 2021-07-05 NOTE — Op Note (Signed)
[facility] ?Patient Name: Timothy Khan ?Procedure Date: 07/05/2021 ?MRN: [id number] ?Attending MD: Ronnette Juniper , MD ?Date of Birth: Jun 10, 1931 ?CSN: [id number] ?Age: 88 ?Admit Type: Outpatient ?Procedure:                ERCP ?Indications:              For therapy of acute recurrent pancreatitis, Acute  ?                          recurrent pancreatitis suspected due to  ?                          microlithiasis ?Providers:                Ronnette Juniper, MD, Burtis Junes, RN, Tyna Jaksch  ?                          Technician ?Referring MD:             Celene Kras ?Medicines:                Monitored Anesthesia Care ?Complications:            No immediate complications. Estimated blood loss:  ?                          Minimal. ?Estimated Blood Loss:     Estimated blood loss was minimal. ?Procedure:                Pre-Anesthesia Assessment: ?                          - Prior to the procedure, a History and Physical  ?                          was performed, and patient medications and  ?                          allergies were reviewed. The patient's tolerance of  ?                          previous anesthesia was also reviewed. The risks  ?                          and benefits of the procedure and the sedation  ?                          options and risks were discussed with the patient.  ?                          All questions were answered, and informed consent  ?                          was obtained. Prior Anticoagulants: The patient has  ?                          taken no previous anticoagulant or antiplatelet  ?  agents. ASA Grade Assessment: III - A patient with  ?                          severe systemic disease. After reviewing the risks  ?                          and benefits, the patient was deemed in  ?                          satisfactory condition to undergo the procedure. ?                          After obtaining informed consent, the scope was  ?                           passed under direct vision. Throughout the  ?                          procedure, the patient's blood pressure, pulse, and  ?                          oxygen saturations were monitored continuously. The  ?                          TJF-[id number] ([id number]) Olympus duodenoscope was  ?                          introduced through the mouth, and used to inject  ?                          contrast into and used to inject contrast into the  ?                          bile duct. The ERCP was accomplished without  ?                          difficulty. The patient tolerated the procedure  ?                          well. ?Scope In: ?Scope Out: ?Findings: ?     The scout film was normal. The esophagus was successfully intubated  ?     under direct vision. The scope was advanced to a normal major papilla in  ?     the descending duodenum without detailed examination of the pharynx,  ?     larynx and associated structures, and upper GI tract. The upper GI tract  ?     was grossly normal. ?     The ampulla was hidden under a duodenal fold and was below a  ?     diverticulum. ?     The bile duct was deeply cannulated. Contrast was injected. The lower  ?     third of the main bile duct contained one stone, which was 6 mm in  ?     diameter. A straight Roadrunner wire was passed into the biliary tree. ?  An 8 mm biliary sphincterotomy was made with a braided traction  ?     (standard) sphincterotome using ERBE electrocautery. There was no  ?     post-sphincterotomy bleeding. ?     The biliary tree was swept with a 12 mm balloon starting at the  ?     bifurcation. ?     One stone was removed. No stones remained. ?     The pancreatic duct was not cannulated or injected. ?Impression:               - Choledocholithiasis was found. Complete removal  ?                          was accomplished by biliary sphincterotomy and  ?                          balloon extraction. ?                          - A biliary  sphincterotomy was performed. ?                          - The biliary tree was swept. ?Moderate Sedation: ?     Patient did not receive moderate sedation for this procedure, but  ?     instead received monitored anesthesia care. ?Recommendation:           - Patient has a contact number available for  ?                          emergencies. The signs and symptoms of potential  ?                          delayed complications were discussed with the  ?                          patient. Return to normal activities tomorrow.  ?                          Written discharge instructions were provided to the  ?                          patient. ?                          - Advance diet as tolerated. ?Procedure Code(s):        --- Professional --- ?                          [phone], Endoscopic retrograde  ?                          cholangiopancreatography (ERCP); with removal of  ?                          calculi/debris from biliary/pancreatic duct(s) ?  [id number], Endoscopic retrograde  ?                          cholangiopancreatography (ERCP); with  ?                          sphincterotomy/papillotomy ?Diagnosis Code(s):        --- Professional --- ?                          K80.50, Calculus of bile duct without cholangitis  ?                          or cholecystitis without obstruction ?                          K85.90, Acute pancreatitis without necrosis or  ?                          infection, unspecified ?CPT copyright 2019 American Medical Association. All rights reserved. ?The codes documented in this report are preliminary and upon coder review may  ?be revised to meet current compliance requirements. ?Ronnette Juniper, MD ?07/05/2021 11:43:52 AM ?This report has been signed electronically. ?Number of Addenda: 0 ?

## 2021-07-05 NOTE — H&P (Signed)
?History of Present Illness  ?General:   ?       89/male with history of gallstone pancreatitis, ?ascending cholangitis,Enterococcus bacteremia, cholecystectomy(11/22:Acute and chronic cholecystitis with mucosal necrosis, no dysplasia or malignancy, associated fragments of adherent liver, no malignancy), TMA-2Q, diastolic CHF, OSA not on CPAP, BPH, GERD and HLD directed to ED with concern for pancreatitis as he had epigastric abdominal pain, elevated liver enzymes and elevated lipase in ED(TB/AST/ALT/ALP of 2.4/373/552/188 on admission and 0.5/27/109/134 on discharge) admitted from 3/16 to 06/06/21.  ?       RUQ [name] with hepatic steatosis with multiple simple hepatic cysts.  ?       MRCP, which was basically negative. ?       He was discharged home on oral antibiotics for a week. ?       He c/o epigastric discomfort, increased belching and increased gas. ?       Denies difficutly or pain on swallowing. ?       Denies acid reflux or heartburn. ?       He had recurrence of pain last night with dinner when he ate sphaghetti and it lasted for 5 minutes. ?       Denies nausea, vomting or fever. ?       He takes zofran or gas x as needed. ?       Denies alcohol use. ?       His Bms are irregular, denies blood in stool or black stools. Last colonoscopy was in 2007 with Dr.Weissman and was getting cologuard testing since then.  ?  ? ?Current Medications  ?Taking  ?amLODIPine Besylate 5 mg Tablet TAKE ONE TABLET BY MOUTH ONCE DAILY twice a day, Notes: twice a day ?  ? ?Gabapentin 300 mg Capsule TAKE TWO CAPSULES BY MOUTH EVERYDAY AT BEDTIME  ?  ? ?Solifenacin Succinate 5 MG Tablet 1 tablet Orally Once a day ?  ? ?Tamsulosin HCl 0.4 mg Capsule TAKE ONE CAPSULE ONCE DAILY BY MOUTH 30 MINUTES AFTER THE SAME MEAL EACH DAY  ?  ? ?traMADol HCl 50 MG Tablet One tablet a.m. when necessary, 2 tablets p.m. Orally Once a day, Notes: Decreased by hospital ?  ? ?NexIUM(Esomeprazole Magnesium) 20 MG Capsule Delayed Release 1 capsule Orally  Once a day ?  ? ?Lidex(Fluocinonide)  ?  ? ?Florastor(Saccharomyces boulardii) 250 MG Capsule 1 capsule Orally Twice a day ?  ? ?Zofran(Ondansetron HCl) 4 MG Tablet 1 tablet Orally three times a day as needed ?  ? ?Fluticasone Propionate 50 Suspension INSTILL TWO SPRAYS INTO EACH NOSTRIL ONCE DAILY  ?  ? ?Coenzyme Q10 300 MG Capsule as directed Orally once a day ?  ? ?Vitamin B12 1000 MCG Tablet Extended Release 5057mg Orally Once a week ?  ? ?Vitamin D 2000 Tablet 1 tablet Orally every other day ?  ? ?ZyrTEC Allergy(Cetirizine HCl) 10 MG Tablet 1 tablet Orally Once a day ?  ? ?Senna S(Senna) 8.6-50 MG Tablet 1 tablet as needed Orally Twice a day, Notes: Lised in [facility]?  ? ?Triamcinolone Acetonide 0.1 % Lotion 1 application Externally Twice a day ?  ? ?MiraLax(Polyethylene Glycol 3350) 17 GM/SCOOP Powder as directed Orally  ?  ? ?Mometasone Furoate 0.1 % Cream 1 application Externally Once a day ?  ? ?Diclofenac Sodium 1 % Gel 1 application Externally Four times a day ?  ? ?Acetaminophen 325 MG Tablet 2 tablets as needed mild pain or fever of 101 or greater Orally every  6 hrs ?  ?Not-Taking  ?Linezolid 600 MG Tablet 1 tablet Orally every 12 hrs ?  ? ?Albuterol Sulfate HFA 108 (90 Base) MCG/ACT Aerosol Solution 2 puff as needed Inhalation every 6hrs ?  ? ?Medication List reviewed and reconciled with the patient  ?  ?  ?  ? ?Past Medical History  ? ?     Hypertension. ?  ? ?     chronic kidney disease stage III. ?  ? ?     BPH/borderline PSA. ?  ? ?     GERD. ?  ? ?      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 ?  ? ?     Chronic cough. ?  ? ?     diastolic dysfunction, echocardiogram, dyspnea workup including cardiac catheterization, echo, Cardiolite, pulmonary evaluation negative except for diastolic dysfunction. ?  ? ?     Seasonal allergic rhinitis, spring and fall. ?  ? ?     Hyperlipidemia. ?  ? ?     erectile dysfunction/Peyronie's  disease/decreased libido, normal testosterone. ?  ? ?     basal cell carcinoma - [place]. ?  ? ?     Onychomycosis. ?  ? ?     Mild memory loss. ?  ? ?     Episodic low back pain. ?  ? ?     SDH/skull fracture 2015. ?  ? ?     Neuro willis, ophtho shapiro derm lupton. ?  ? ?     Admitted 01/2021 acute cholecystitis required post-surgical drain. ?  ? ?     Severe OSA ( HST 11/20 AHI 33/hr; O2 min-76%-32 mins)-APAP. ?  ? ?     Admitted 02/2021 sepsis secondary to acute pancreatitis, ascending colangitis, bacteremia. ?  ?  ?  ? ?Surgical History  ? ?      left knee surgery, bone chip 1980s  ? ?      tonsillectomy   ? ?      left index finger nerve repair left cataract surgery   ? ?      left cataract surgery   ? ?      basal cell skin cancer, Tonia Brooms   ? ?      Mohs surgery basal cell carcinoma, nose and forehead July 2007  ? ?      right knee arthroscopy,Alusio November 2007  ? ?      left cataract, Gershon Crane may 2007  ? ?      MOHS l leg basal cell ca 7/10  ? ?      oral infection surgery 7/12  ? ?      Upper GI Endoscopy 05/2018, 02/2020  ? ?      laparoscopic cholecystectomy, Gross 11/22  ?  ?  ? ?Family History  ? ?Father: deceased 73 yrs, MI, diagnosed with Coronary artery disease  ? ?Mother: deceased 48 yrs, diagnosed with Breast cancer  ? ?Brother 1: alive 44 yrs, MI in 49s, diagnosed with Coronary artery disease  ? ?Daughter(s): lupus  ? ?1 brother(s) . 1 son(s) , 3 daughter(s) - healthy.   ? ?No family hx of colon polyps ,colon cancer or liver disease.  ?  ?  ? ?Social History  ?General:   ?Tobacco use   ?    cigarettes:  Former smoker ?    Quit in year  1986 ?    Pack-year  Hx:  15 ?    Tobacco history last updated  06/13/2021 ?    Additional Findings: Tobacco Non-User  Ex-moderate cigarette smoker (10-19/day) ?    Vaping  No ?no EXPOSURE TO PASSIVE SMOKE.  ?no Alcohol, no.  ?Caffeine: yes.  ?no Recreational drug use.  ?Exercise: none recently silver sneakers 4x a week, cardio and yoga, strength, zumba.   ?Marital Status: Married.  ?Children: Boys, 3, girls, 1; GC-9;GGC-6.  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   ?  ? ?Allergies  ? ?Codeine (for allergy): feel bad - Side Effects  ? ?Doxazosin Mesylate: fatigue - Side Effects  ? ?Macrolides and Ketolides: rash - Allergy  ? ?Lisinopril: cough - Side Effects  ? ?Augmentin: rash?  ?  ?  ? ?Hospitalization/Major Diagnostic Procedure  ? ?Per pt saw Dr. Therisa Doyne in hospital 05/2018  ? ?Not within past yr. 02/2020  ? ?None in the past year 07/2020  ? ?ER visit for Pancreatitis 08/2020  ? ?Hospialization due to stomach, chest pains 02/2021  ? ?Hospitalization due to stomach, chest pains 05/2021  ?  ?  ? ?Review of Systems  ?GI PROCEDURE:   ?       Pacemaker/ AICD no.  Artificial heart valves no.  MI/heart attack no.  Abnormal heart rhythm no.  Angina no.  CVA no.  Hypertension no.  Hypotension no.  Asthma, COPD no.  Sleep apnea YES , using CPAP.  Seizure disorders no.  Artificial joints no.  Diabetes no.  Significant headaches no.  Vertigo no.  Depression/anxiety no.  Abnormal bleeding no.  Kidney Disease no.  Liver disease no.  Blood transfusion no  ?  ? ? ?Vital Signs  ?Wt 173.2, Wt change -3.8 lbs, Ht 70, BMI 24.85, Temp 98.2, Pulse sitting 69, BP sitting 124/72.  ?  ? ?Examination  ?Gastroenterology:: ?       GENERAL APPEARANCE: Well developed, well nourished, no active distress, pleasant.  ?       SCLERA: anicteric.  ?       CARDIOVASCULAR Normal RRR .  ?       RESPIRATORY Breath sounds normal. Respiration even and unlabored.  ?       ABDOMEN No masses palpated. Liver and spleen not palpated, normal. Bowel sounds normal, Abdomen not distended.  ?       EXTREMITIES: No edema.  ?       NEURO: alert, oriented to time, place and person, uses a walker.  ?       PSYCH: mood/affect normal.  ?  ?  ? ?Assessments  ?  ? ?1. Acute biliary pancreatitis, unspecified complication status - T26.71 (Primary)  ?  ? ?Treatment  ? ?1. Acute biliary pancreatitis, unspecified  complication status   ?      IMAGING: ERCP w/Spincterotomy ?             Florene Glen, Amy 06/13/2021 04:13:07 PM > Scheduled [facility] 4/18, # [id number]  ? ?Notes: Although the patient does not have a gallbladder, and CBD did not

## 2021-07-05 NOTE — Anesthesia Postprocedure Evaluation (Signed)
Anesthesia Post Note ? ?Patient: Timothy Khan ? ?Procedure(s) Performed: ENDOSCOPIC RETROGRADE CHOLANGIOPANCREATOGRAPHY (ERCP) WITH PROPOFOL ?SPHINCTEROTOMY ?REMOVAL OF STONES ? ?  ? ?Patient location during evaluation: PACU ?Anesthesia Type: General ?Level of consciousness: awake and alert ?Pain management: pain level controlled ?Vital Signs Assessment: post-procedure vital signs reviewed and stable ?Respiratory status: spontaneous breathing, nonlabored ventilation and respiratory function stable ?Cardiovascular status: stable and blood pressure returned to baseline ?Anesthetic complications: no ? ? ?No notable events documented. ? ?Last Vitals:  ?Vitals:  ? 07/05/21 1220 07/05/21 1230  ?BP: 133/76 127/79  ?Pulse: 74 90  ?Resp:  13  ?Temp:    ?SpO2: 95% 98%  ?  ?Last Pain:  ?Vitals:  ? 07/05/21 1230  ?TempSrc:   ?PainSc: 0-No pain  ? ? ?  ?  ?  ?  ?  ?  ? ?Audry Pili ? ? ? ? ?

## 2021-07-05 NOTE — Interval H&P Note (Signed)
History and Physical Interval Note: ?89/male with recurrent pancreatitis with abnormal LFTs for ERCP and sphincterotomy with propofol. ? ?07/05/2021 ?10:43 AM ? ?Timothy Khan  has presented today for ERCP, with the diagnosis of Acute biliary pancreatitis.  The various methods of treatment have been discussed with the patient and family. After consideration of risks, benefits and other options for treatment, the patient has consented to  Procedure(s): ?ENDOSCOPIC RETROGRADE CHOLANGIOPANCREATOGRAPHY (ERCP) WITH PROPOFOL (N/A) as a surgical intervention.  The patient's history has been reviewed, patient examined, no change in status, stable for surgery.  I have reviewed the patient's chart and labs.  Questions were answered to the patient's satisfaction.   ? ? ?Ronnette Juniper ? ? ?

## 2021-07-05 NOTE — Anesthesia Preprocedure Evaluation (Addendum)
Anesthesia Evaluation  ?Patient identified by MRN, date of birth, ID band ?Patient awake ? ? ? ?Reviewed: ?Allergy & Precautions, NPO status , Patient's Chart, lab work & pertinent test results ? ?History of Anesthesia Complications ?Negative for: history of anesthetic complications ? ?Airway ?Mallampati: II ? ?TM Distance: >3 FB ?Neck ROM: Full ? ? ? Dental ? ?(+) Dental Advisory Given, Upper Dentures, Partial Lower ?  ?Pulmonary ?sleep apnea and Continuous Positive Airway Pressure Ventilation , former smoker,  ?  ?Pulmonary exam normal ? ? ? ? ? ? ? Cardiovascular ?hypertension, Pt. on medications ?Normal cardiovascular exam+ dysrhythmias  ? ? ?'23 Myoperfusion - 1. Normal study without evidence of ischemia or infarction.  ?2.Normal LVEF, >65%.  ?3.This is a low-risk study.  ? ? ?  ?Neuro/Psych ? ?Perioperative memory issues per daughter ? ?Hx SDH ?Gait disturbance ?Foot drop b/l ? ? Neuromuscular disease   ? GI/Hepatic ?Neg liver ROS, hiatal hernia, GERD  Medicated and Controlled,  ?Endo/Other  ?negative endocrine ROS ? Renal/GU ?CRFRenal disease  ? ?  ?Musculoskeletal ?negative musculoskeletal ROS ?(+)  ? Abdominal ?  ?Peds ? Hematology ?negative hematology ROS ?(+)   ?Anesthesia Other Findings ? ? Reproductive/Obstetrics ? ?  ? ? ? ? ? ? ? ? ? ? ? ? ? ?  ?  ? ? ? ? ? ? ? ?Anesthesia Physical ?Anesthesia Plan ? ?ASA: 3 ? ?Anesthesia Plan: General  ? ?Post-op Pain Management: Minimal or no pain anticipated  ? ?Induction: Intravenous ? ?PONV Risk Score and Plan: 2 and Treatment may vary due to age or medical condition, TIVA and Ondansetron ? ?Airway Management Planned: Oral ETT ? ?Additional Equipment: None ? ?Intra-op Plan:  ? ?Post-operative Plan: Extubation in OR ? ?Informed Consent: I have reviewed the patients History and Physical, chart, labs and discussed the procedure including the risks, benefits and alternatives for the proposed anesthesia with the patient or authorized  representative who has indicated his/her understanding and acceptance.  ? ? ? ?Dental advisory given ? ?Plan Discussed with: CRNA and Anesthesiologist ? ?Anesthesia Plan Comments:   ? ? ? ? ? ?Anesthesia Quick Evaluation ? ?

## 2021-07-06 ENCOUNTER — Encounter (HOSPITAL_COMMUNITY): Payer: Self-pay | Admitting: Gastroenterology

## 2021-07-07 DIAGNOSIS — G629 Polyneuropathy, unspecified: Secondary | ICD-10-CM | POA: Diagnosis not present

## 2021-07-07 DIAGNOSIS — Z79899 Other long term (current) drug therapy: Secondary | ICD-10-CM | POA: Diagnosis not present

## 2021-07-07 DIAGNOSIS — I5032 Chronic diastolic (congestive) heart failure: Secondary | ICD-10-CM | POA: Diagnosis not present

## 2021-07-07 DIAGNOSIS — K859 Acute pancreatitis without necrosis or infection, unspecified: Secondary | ICD-10-CM | POA: Diagnosis not present

## 2021-07-07 DIAGNOSIS — M21371 Foot drop, right foot: Secondary | ICD-10-CM | POA: Diagnosis not present

## 2021-07-07 DIAGNOSIS — B964 Proteus (mirabilis) (morganii) as the cause of diseases classified elsewhere: Secondary | ICD-10-CM | POA: Diagnosis not present

## 2021-07-07 DIAGNOSIS — Z9981 Dependence on supplemental oxygen: Secondary | ICD-10-CM | POA: Diagnosis not present

## 2021-07-07 DIAGNOSIS — M21372 Foot drop, left foot: Secondary | ICD-10-CM | POA: Diagnosis not present

## 2021-07-07 DIAGNOSIS — G4733 Obstructive sleep apnea (adult) (pediatric): Secondary | ICD-10-CM | POA: Diagnosis not present

## 2021-07-07 DIAGNOSIS — Z85828 Personal history of other malignant neoplasm of skin: Secondary | ICD-10-CM | POA: Diagnosis not present

## 2021-07-07 DIAGNOSIS — Z87891 Personal history of nicotine dependence: Secondary | ICD-10-CM | POA: Diagnosis not present

## 2021-07-07 DIAGNOSIS — I130 Hypertensive heart and chronic kidney disease with heart failure and stage 1 through stage 4 chronic kidney disease, or unspecified chronic kidney disease: Secondary | ICD-10-CM | POA: Diagnosis not present

## 2021-07-07 DIAGNOSIS — Z9181 History of falling: Secondary | ICD-10-CM | POA: Diagnosis not present

## 2021-07-07 DIAGNOSIS — K219 Gastro-esophageal reflux disease without esophagitis: Secondary | ICD-10-CM | POA: Diagnosis not present

## 2021-07-07 DIAGNOSIS — N390 Urinary tract infection, site not specified: Secondary | ICD-10-CM | POA: Diagnosis not present

## 2021-07-07 DIAGNOSIS — D631 Anemia in chronic kidney disease: Secondary | ICD-10-CM | POA: Diagnosis not present

## 2021-07-07 DIAGNOSIS — K760 Fatty (change of) liver, not elsewhere classified: Secondary | ICD-10-CM | POA: Diagnosis not present

## 2021-07-07 DIAGNOSIS — N1831 Chronic kidney disease, stage 3a: Secondary | ICD-10-CM | POA: Diagnosis not present

## 2021-07-12 DIAGNOSIS — Z961 Presence of intraocular lens: Secondary | ICD-10-CM | POA: Diagnosis not present

## 2021-07-12 DIAGNOSIS — H538 Other visual disturbances: Secondary | ICD-10-CM | POA: Diagnosis not present

## 2021-07-12 DIAGNOSIS — H04123 Dry eye syndrome of bilateral lacrimal glands: Secondary | ICD-10-CM | POA: Diagnosis not present

## 2021-07-15 DIAGNOSIS — Z79899 Other long term (current) drug therapy: Secondary | ICD-10-CM | POA: Diagnosis not present

## 2021-07-15 DIAGNOSIS — K219 Gastro-esophageal reflux disease without esophagitis: Secondary | ICD-10-CM | POA: Diagnosis not present

## 2021-07-15 DIAGNOSIS — M21372 Foot drop, left foot: Secondary | ICD-10-CM | POA: Diagnosis not present

## 2021-07-15 DIAGNOSIS — I130 Hypertensive heart and chronic kidney disease with heart failure and stage 1 through stage 4 chronic kidney disease, or unspecified chronic kidney disease: Secondary | ICD-10-CM | POA: Diagnosis not present

## 2021-07-15 DIAGNOSIS — Z9181 History of falling: Secondary | ICD-10-CM | POA: Diagnosis not present

## 2021-07-15 DIAGNOSIS — K859 Acute pancreatitis without necrosis or infection, unspecified: Secondary | ICD-10-CM | POA: Diagnosis not present

## 2021-07-15 DIAGNOSIS — N390 Urinary tract infection, site not specified: Secondary | ICD-10-CM | POA: Diagnosis not present

## 2021-07-15 DIAGNOSIS — M21371 Foot drop, right foot: Secondary | ICD-10-CM | POA: Diagnosis not present

## 2021-07-15 DIAGNOSIS — G629 Polyneuropathy, unspecified: Secondary | ICD-10-CM | POA: Diagnosis not present

## 2021-07-15 DIAGNOSIS — K760 Fatty (change of) liver, not elsewhere classified: Secondary | ICD-10-CM | POA: Diagnosis not present

## 2021-07-15 DIAGNOSIS — B964 Proteus (mirabilis) (morganii) as the cause of diseases classified elsewhere: Secondary | ICD-10-CM | POA: Diagnosis not present

## 2021-07-15 DIAGNOSIS — Z87891 Personal history of nicotine dependence: Secondary | ICD-10-CM | POA: Diagnosis not present

## 2021-07-15 DIAGNOSIS — N1831 Chronic kidney disease, stage 3a: Secondary | ICD-10-CM | POA: Diagnosis not present

## 2021-07-15 DIAGNOSIS — Z9981 Dependence on supplemental oxygen: Secondary | ICD-10-CM | POA: Diagnosis not present

## 2021-07-15 DIAGNOSIS — Z85828 Personal history of other malignant neoplasm of skin: Secondary | ICD-10-CM | POA: Diagnosis not present

## 2021-07-15 DIAGNOSIS — G4733 Obstructive sleep apnea (adult) (pediatric): Secondary | ICD-10-CM | POA: Diagnosis not present

## 2021-07-15 DIAGNOSIS — D631 Anemia in chronic kidney disease: Secondary | ICD-10-CM | POA: Diagnosis not present

## 2021-07-15 DIAGNOSIS — I5032 Chronic diastolic (congestive) heart failure: Secondary | ICD-10-CM | POA: Diagnosis not present

## 2021-07-19 DIAGNOSIS — G4733 Obstructive sleep apnea (adult) (pediatric): Secondary | ICD-10-CM | POA: Diagnosis not present

## 2021-07-21 DIAGNOSIS — Z87891 Personal history of nicotine dependence: Secondary | ICD-10-CM | POA: Diagnosis not present

## 2021-07-21 DIAGNOSIS — Z85828 Personal history of other malignant neoplasm of skin: Secondary | ICD-10-CM | POA: Diagnosis not present

## 2021-07-21 DIAGNOSIS — G629 Polyneuropathy, unspecified: Secondary | ICD-10-CM | POA: Diagnosis not present

## 2021-07-21 DIAGNOSIS — G4733 Obstructive sleep apnea (adult) (pediatric): Secondary | ICD-10-CM | POA: Diagnosis not present

## 2021-07-21 DIAGNOSIS — I130 Hypertensive heart and chronic kidney disease with heart failure and stage 1 through stage 4 chronic kidney disease, or unspecified chronic kidney disease: Secondary | ICD-10-CM | POA: Diagnosis not present

## 2021-07-21 DIAGNOSIS — K219 Gastro-esophageal reflux disease without esophagitis: Secondary | ICD-10-CM | POA: Diagnosis not present

## 2021-07-21 DIAGNOSIS — K760 Fatty (change of) liver, not elsewhere classified: Secondary | ICD-10-CM | POA: Diagnosis not present

## 2021-07-21 DIAGNOSIS — N390 Urinary tract infection, site not specified: Secondary | ICD-10-CM | POA: Diagnosis not present

## 2021-07-21 DIAGNOSIS — M21372 Foot drop, left foot: Secondary | ICD-10-CM | POA: Diagnosis not present

## 2021-07-21 DIAGNOSIS — M21371 Foot drop, right foot: Secondary | ICD-10-CM | POA: Diagnosis not present

## 2021-07-21 DIAGNOSIS — K859 Acute pancreatitis without necrosis or infection, unspecified: Secondary | ICD-10-CM | POA: Diagnosis not present

## 2021-07-21 DIAGNOSIS — N1831 Chronic kidney disease, stage 3a: Secondary | ICD-10-CM | POA: Diagnosis not present

## 2021-07-21 DIAGNOSIS — Z79899 Other long term (current) drug therapy: Secondary | ICD-10-CM | POA: Diagnosis not present

## 2021-07-21 DIAGNOSIS — Z9181 History of falling: Secondary | ICD-10-CM | POA: Diagnosis not present

## 2021-07-21 DIAGNOSIS — D631 Anemia in chronic kidney disease: Secondary | ICD-10-CM | POA: Diagnosis not present

## 2021-07-21 DIAGNOSIS — B964 Proteus (mirabilis) (morganii) as the cause of diseases classified elsewhere: Secondary | ICD-10-CM | POA: Diagnosis not present

## 2021-07-21 DIAGNOSIS — Z9981 Dependence on supplemental oxygen: Secondary | ICD-10-CM | POA: Diagnosis not present

## 2021-07-21 DIAGNOSIS — I5032 Chronic diastolic (congestive) heart failure: Secondary | ICD-10-CM | POA: Diagnosis not present

## 2021-07-23 DIAGNOSIS — M21371 Foot drop, right foot: Secondary | ICD-10-CM | POA: Diagnosis not present

## 2021-07-23 DIAGNOSIS — I130 Hypertensive heart and chronic kidney disease with heart failure and stage 1 through stage 4 chronic kidney disease, or unspecified chronic kidney disease: Secondary | ICD-10-CM | POA: Diagnosis not present

## 2021-07-23 DIAGNOSIS — Z79899 Other long term (current) drug therapy: Secondary | ICD-10-CM | POA: Diagnosis not present

## 2021-07-23 DIAGNOSIS — D631 Anemia in chronic kidney disease: Secondary | ICD-10-CM | POA: Diagnosis not present

## 2021-07-23 DIAGNOSIS — Z87891 Personal history of nicotine dependence: Secondary | ICD-10-CM | POA: Diagnosis not present

## 2021-07-23 DIAGNOSIS — I5032 Chronic diastolic (congestive) heart failure: Secondary | ICD-10-CM | POA: Diagnosis not present

## 2021-07-23 DIAGNOSIS — Z9181 History of falling: Secondary | ICD-10-CM | POA: Diagnosis not present

## 2021-07-23 DIAGNOSIS — K859 Acute pancreatitis without necrosis or infection, unspecified: Secondary | ICD-10-CM | POA: Diagnosis not present

## 2021-07-23 DIAGNOSIS — B964 Proteus (mirabilis) (morganii) as the cause of diseases classified elsewhere: Secondary | ICD-10-CM | POA: Diagnosis not present

## 2021-07-23 DIAGNOSIS — G4733 Obstructive sleep apnea (adult) (pediatric): Secondary | ICD-10-CM | POA: Diagnosis not present

## 2021-07-23 DIAGNOSIS — K219 Gastro-esophageal reflux disease without esophagitis: Secondary | ICD-10-CM | POA: Diagnosis not present

## 2021-07-23 DIAGNOSIS — N390 Urinary tract infection, site not specified: Secondary | ICD-10-CM | POA: Diagnosis not present

## 2021-07-23 DIAGNOSIS — G629 Polyneuropathy, unspecified: Secondary | ICD-10-CM | POA: Diagnosis not present

## 2021-07-23 DIAGNOSIS — N1831 Chronic kidney disease, stage 3a: Secondary | ICD-10-CM | POA: Diagnosis not present

## 2021-07-23 DIAGNOSIS — M21372 Foot drop, left foot: Secondary | ICD-10-CM | POA: Diagnosis not present

## 2021-07-23 DIAGNOSIS — Z9981 Dependence on supplemental oxygen: Secondary | ICD-10-CM | POA: Diagnosis not present

## 2021-07-23 DIAGNOSIS — K760 Fatty (change of) liver, not elsewhere classified: Secondary | ICD-10-CM | POA: Diagnosis not present

## 2021-07-23 DIAGNOSIS — Z85828 Personal history of other malignant neoplasm of skin: Secondary | ICD-10-CM | POA: Diagnosis not present

## 2021-07-27 DIAGNOSIS — N1831 Chronic kidney disease, stage 3a: Secondary | ICD-10-CM | POA: Diagnosis not present

## 2021-07-27 DIAGNOSIS — K219 Gastro-esophageal reflux disease without esophagitis: Secondary | ICD-10-CM | POA: Diagnosis not present

## 2021-07-27 DIAGNOSIS — G8929 Other chronic pain: Secondary | ICD-10-CM | POA: Diagnosis not present

## 2021-07-27 DIAGNOSIS — I10 Essential (primary) hypertension: Secondary | ICD-10-CM | POA: Diagnosis not present

## 2021-07-27 DIAGNOSIS — E782 Mixed hyperlipidemia: Secondary | ICD-10-CM | POA: Diagnosis not present

## 2021-08-06 DIAGNOSIS — I5032 Chronic diastolic (congestive) heart failure: Secondary | ICD-10-CM | POA: Diagnosis not present

## 2021-08-06 DIAGNOSIS — N1831 Chronic kidney disease, stage 3a: Secondary | ICD-10-CM | POA: Diagnosis not present

## 2021-08-06 DIAGNOSIS — K219 Gastro-esophageal reflux disease without esophagitis: Secondary | ICD-10-CM | POA: Diagnosis not present

## 2021-08-06 DIAGNOSIS — I130 Hypertensive heart and chronic kidney disease with heart failure and stage 1 through stage 4 chronic kidney disease, or unspecified chronic kidney disease: Secondary | ICD-10-CM | POA: Diagnosis not present

## 2021-08-08 DIAGNOSIS — I5032 Chronic diastolic (congestive) heart failure: Secondary | ICD-10-CM | POA: Diagnosis not present

## 2021-08-08 DIAGNOSIS — I130 Hypertensive heart and chronic kidney disease with heart failure and stage 1 through stage 4 chronic kidney disease, or unspecified chronic kidney disease: Secondary | ICD-10-CM | POA: Diagnosis not present

## 2021-08-08 DIAGNOSIS — N1831 Chronic kidney disease, stage 3a: Secondary | ICD-10-CM | POA: Diagnosis not present

## 2021-08-08 DIAGNOSIS — K219 Gastro-esophageal reflux disease without esophagitis: Secondary | ICD-10-CM | POA: Diagnosis not present

## 2021-08-11 DIAGNOSIS — K219 Gastro-esophageal reflux disease without esophagitis: Secondary | ICD-10-CM | POA: Diagnosis not present

## 2021-08-11 DIAGNOSIS — N1831 Chronic kidney disease, stage 3a: Secondary | ICD-10-CM | POA: Diagnosis not present

## 2021-08-11 DIAGNOSIS — I5032 Chronic diastolic (congestive) heart failure: Secondary | ICD-10-CM | POA: Diagnosis not present

## 2021-08-11 DIAGNOSIS — I130 Hypertensive heart and chronic kidney disease with heart failure and stage 1 through stage 4 chronic kidney disease, or unspecified chronic kidney disease: Secondary | ICD-10-CM | POA: Diagnosis not present

## 2021-08-12 DIAGNOSIS — G4733 Obstructive sleep apnea (adult) (pediatric): Secondary | ICD-10-CM | POA: Diagnosis not present

## 2021-08-12 DIAGNOSIS — R509 Fever, unspecified: Secondary | ICD-10-CM | POA: Diagnosis not present

## 2021-08-12 DIAGNOSIS — N390 Urinary tract infection, site not specified: Secondary | ICD-10-CM | POA: Diagnosis not present

## 2021-08-12 DIAGNOSIS — E875 Hyperkalemia: Secondary | ICD-10-CM | POA: Diagnosis not present

## 2021-08-12 DIAGNOSIS — I5032 Chronic diastolic (congestive) heart failure: Secondary | ICD-10-CM | POA: Diagnosis not present

## 2021-08-15 ENCOUNTER — Other Ambulatory Visit: Payer: Self-pay

## 2021-08-15 ENCOUNTER — Encounter (HOSPITAL_BASED_OUTPATIENT_CLINIC_OR_DEPARTMENT_OTHER): Payer: Self-pay | Admitting: Emergency Medicine

## 2021-08-15 ENCOUNTER — Emergency Department (HOSPITAL_BASED_OUTPATIENT_CLINIC_OR_DEPARTMENT_OTHER): Payer: Medicare Other

## 2021-08-15 ENCOUNTER — Emergency Department (HOSPITAL_BASED_OUTPATIENT_CLINIC_OR_DEPARTMENT_OTHER)
Admission: EM | Admit: 2021-08-15 | Discharge: 2021-08-15 | Disposition: A | Discharge status: Home / Self Care | Payer: Medicare Other | Attending: Emergency Medicine | Admitting: Emergency Medicine

## 2021-08-15 DIAGNOSIS — R519 Headache, unspecified: Secondary | ICD-10-CM | POA: Insufficient documentation

## 2021-08-15 DIAGNOSIS — W07XXXA Fall from chair, initial encounter: Secondary | ICD-10-CM | POA: Diagnosis not present

## 2021-08-15 DIAGNOSIS — W19XXXA Unspecified fall, initial encounter: Secondary | ICD-10-CM

## 2021-08-15 DIAGNOSIS — M542 Cervicalgia: Secondary | ICD-10-CM | POA: Diagnosis not present

## 2021-08-15 DIAGNOSIS — I6381 Other cerebral infarction due to occlusion or stenosis of small artery: Secondary | ICD-10-CM | POA: Diagnosis not present

## 2021-08-15 DIAGNOSIS — S199XXA Unspecified injury of neck, initial encounter: Secondary | ICD-10-CM | POA: Diagnosis not present

## 2021-08-15 MED ORDER — ACETAMINOPHEN 325 MG PO TABS
650.0000 mg | ORAL_TABLET | Freq: Once | ORAL | Status: AC
Start: 2021-08-15 — End: 2021-08-15
  Administered 2021-08-15: 650 mg via ORAL
  Filled 2021-08-15: qty 2

## 2021-08-15 NOTE — Discharge Instructions (Addendum)
Your CT head, and cervical spine were without any concerns.  Your exam was also reassuring.  Take Tylenol as you need to for headache.  Otherwise follow-up with your PCP as needed.  If you have any new or concerning symptoms you can return to the emergency room for evaluation.

## 2021-08-15 NOTE — ED Triage Notes (Signed)
Patient states he slid out of a chair and hit the back of his head on an oxygen tank. C/o posterior head pain. Denies taking any blood thinners. No LOC.

## 2021-08-15 NOTE — ED Provider Notes (Signed)
[facility]ER[facility]ENC[facility] DEPART[facility] Provider Note   CSN: [id number] Arrival date & time: 08/15/21  1623     History  Chief Complaint  Patient presents with   Timothy Khan is a 88 y.o. male.  88 year old male presents today for evaluation of headache following a fall that occurred just prior to arrival.  Patient states he was sitting on an office chair which flipped forward causing him to fall forward and strike the oxygen tank.  He denies loss of consciousness.  He is not on anticoagulation.  Denies any other injury.  The history is provided by the patient. No language interpreter was used.      Home Medications Prior to Admission medications   Medication Sig Start Date End Date Taking? Authorizing Provider  acetaminophen (TYLENOL) 500 MG tablet Take 1,000 mg by mouth every 6 (six) hours as needed for moderate pain.    [provider]  amLODipine (NORVASC) 5 MG tablet Take 5 mg by mouth at bedtime. 06/24/18   [provider]  cefadroxil (DURICEF) 500 MG capsule Take 1 capsule (500 mg total) by mouth 2 (two) times daily. Patient not taking: Reported on 07/04/2021 06/06/21   Mercy Riding, MD  cetirizine (ZYRTEC) 10 MG tablet Take 10 mg by mouth at bedtime.     [provider]  Cholecalciferol (VITAMIN D3) 2000 UNITS capsule Take 2,000 Units by mouth 3 (three) times a week.    [provider]  Coenzyme Q10 (CO Q-10) 300 MG CAPS Take 300 mg by mouth daily.    [provider]  Cyanocobalamin (B-12) 5000 MCG CAPS Take 5,000 mcg by mouth once a week.    [provider]  diclofenac Sodium (VOLTAREN) 1 % GEL Apply 2 g topically 4 (four) times daily. Patient taking differently: Apply 2 g topically 4 (four) times daily as needed (pain). 06/06/21   Mercy Riding, MD  esomeprazole (NEXIUM) 20 MG capsule Take 20 mg by mouth daily.     [provider]  fluocinonide (LIDEX) 0.05 % external solution Apply 1  application. topically 2 (two) times daily. 20 drops or 1 ml on the scalp 02/13/20   [provider]  fluticasone (FLONASE) 50 MCG/ACT nasal spray Place 2 sprays into both nostrils at bedtime.  04/16/13   [provider]  gabapentin (NEURONTIN) 300 MG capsule Take 2 capsules (600 mg total) by mouth at bedtime. 12/04/17   Kathrynn Ducking, MD  mometasone (ELOCON) 0.1 % cream Apply 1 application. topically daily.    [provider]  ondansetron (ZOFRAN) 4 MG tablet Take 4 mg by mouth 3 (three) times daily as needed for nausea or vomiting. 01/05/20   [provider]  saccharomyces boulardii (FLORASTOR) 250 MG capsule Take 250 mg by mouth 2 (two) times daily.    [provider]  senna-docusate (SENOKOT-S) 8.6-50 MG tablet Take 2 tablets by mouth daily as needed for mild constipation.    [provider]  solifenacin (VESICARE) 5 MG tablet Take 5 mg by mouth every morning. 03/02/21   [provider]  tamsulosin (FLOMAX) 0.4 MG CAPS Take 0.4 mg by mouth daily.    [provider]  traMADol (ULTRAM) 50 MG tablet Take 1-2 tablets (50-100 mg total) by mouth daily. Patient taking differently: Take 50 mg by mouth 2 (two) times daily. 06/06/21   Mercy Riding, MD  triamcinolone lotion (KENALOG) 0.1 % Apply 1 application. topically daily. 02/04/21  [provider]      Allergies    Doxazosin mesylate, Lisinopril, Amoxicillin-pot clavulanate, Codeine, and Macrolides and ketolides    Review of Systems   Review of Systems  Physical Exam Updated Vital Signs BP (!) 155/75 (BP Location: Left Arm)   Pulse 88   Temp 97.8 F (36.6 C)   Resp 18   Ht '5\' 10"'$  (1.778 m)   Wt 79.4 kg   SpO2 96%   BMI 25.11 kg/m  Physical Exam  ED Results / Procedures / Treatments   Labs (all labs ordered are listed, but only abnormal results are displayed) Labs Reviewed - No data to display  EKG None  Radiology No results  found.  Procedures Procedures    Medications Ordered in ED Medications  acetaminophen (TYLENOL) tablet 650 mg (has no administration in time range)    ED Course/ Medical Decision Making/ A&P                           Medical Decision Making Amount and/or Complexity of Data Reviewed Radiology: ordered.  Risk OTC drugs.   88 year old male presents today for evaluation of headache following a fall that occurred just prior to arrival.  See HPI for details of the fall.  Neurological exam is reassuring and nonfocal.  CT head, and cervical spine are without acute intracranial or cervical spine findings.  Patient currently has posttraumatic headache.  He is without nausea, vomiting, photophobia or other signs or symptoms concerning for concussion.  Will provide with concussion signs or symptoms to be on the look out for and what to expect, and what is concerning that warrant return to the emergency room.  Return precautions discussed with patient.  He voices understanding and is in agreement with plan.  Son is at bedside who is also in agreement.  Patient is appropriate for discharge.  Discharged in stable condition.   Final Clinical Impression(s) / ED Diagnoses Final diagnoses:  Fall, initial encounter    Rx / DC Orders ED Discharge Orders     None         Evlyn Courier, PA-C 08/15/21 1806    Tegeler, Gwenyth Allegra, MD 08/15/21 [phone]

## 2021-08-15 NOTE — ED Notes (Signed)
Pt presented to ED with son, states fell this am and hit an oxygen take, denies any blood thinner medication daily, denies any daily asa, GCS 15 upon arrival.

## 2021-08-16 DIAGNOSIS — N1831 Chronic kidney disease, stage 3a: Secondary | ICD-10-CM | POA: Diagnosis not present

## 2021-08-16 DIAGNOSIS — K219 Gastro-esophageal reflux disease without esophagitis: Secondary | ICD-10-CM | POA: Diagnosis not present

## 2021-08-16 DIAGNOSIS — I5032 Chronic diastolic (congestive) heart failure: Secondary | ICD-10-CM | POA: Diagnosis not present

## 2021-08-16 DIAGNOSIS — I130 Hypertensive heart and chronic kidney disease with heart failure and stage 1 through stage 4 chronic kidney disease, or unspecified chronic kidney disease: Secondary | ICD-10-CM | POA: Diagnosis not present

## 2021-08-18 DIAGNOSIS — N1831 Chronic kidney disease, stage 3a: Secondary | ICD-10-CM | POA: Diagnosis not present

## 2021-08-18 DIAGNOSIS — K219 Gastro-esophageal reflux disease without esophagitis: Secondary | ICD-10-CM | POA: Diagnosis not present

## 2021-08-18 DIAGNOSIS — I130 Hypertensive heart and chronic kidney disease with heart failure and stage 1 through stage 4 chronic kidney disease, or unspecified chronic kidney disease: Secondary | ICD-10-CM | POA: Diagnosis not present

## 2021-08-18 DIAGNOSIS — I5032 Chronic diastolic (congestive) heart failure: Secondary | ICD-10-CM | POA: Diagnosis not present

## 2021-08-22 DIAGNOSIS — K219 Gastro-esophageal reflux disease without esophagitis: Secondary | ICD-10-CM | POA: Diagnosis not present

## 2021-08-22 DIAGNOSIS — N1831 Chronic kidney disease, stage 3a: Secondary | ICD-10-CM | POA: Diagnosis not present

## 2021-08-22 DIAGNOSIS — I5032 Chronic diastolic (congestive) heart failure: Secondary | ICD-10-CM | POA: Diagnosis not present

## 2021-08-22 DIAGNOSIS — I130 Hypertensive heart and chronic kidney disease with heart failure and stage 1 through stage 4 chronic kidney disease, or unspecified chronic kidney disease: Secondary | ICD-10-CM | POA: Diagnosis not present

## 2021-08-25 DIAGNOSIS — I130 Hypertensive heart and chronic kidney disease with heart failure and stage 1 through stage 4 chronic kidney disease, or unspecified chronic kidney disease: Secondary | ICD-10-CM | POA: Diagnosis not present

## 2021-08-25 DIAGNOSIS — K219 Gastro-esophageal reflux disease without esophagitis: Secondary | ICD-10-CM | POA: Diagnosis not present

## 2021-08-25 DIAGNOSIS — I10 Essential (primary) hypertension: Secondary | ICD-10-CM | POA: Diagnosis not present

## 2021-08-25 DIAGNOSIS — G8929 Other chronic pain: Secondary | ICD-10-CM | POA: Diagnosis not present

## 2021-08-25 DIAGNOSIS — N1831 Chronic kidney disease, stage 3a: Secondary | ICD-10-CM | POA: Diagnosis not present

## 2021-08-25 DIAGNOSIS — I5032 Chronic diastolic (congestive) heart failure: Secondary | ICD-10-CM | POA: Diagnosis not present

## 2021-08-25 DIAGNOSIS — E782 Mixed hyperlipidemia: Secondary | ICD-10-CM | POA: Diagnosis not present

## 2021-08-29 DIAGNOSIS — N1831 Chronic kidney disease, stage 3a: Secondary | ICD-10-CM | POA: Diagnosis not present

## 2021-08-29 DIAGNOSIS — I5032 Chronic diastolic (congestive) heart failure: Secondary | ICD-10-CM | POA: Diagnosis not present

## 2021-08-29 DIAGNOSIS — I130 Hypertensive heart and chronic kidney disease with heart failure and stage 1 through stage 4 chronic kidney disease, or unspecified chronic kidney disease: Secondary | ICD-10-CM | POA: Diagnosis not present

## 2021-08-29 DIAGNOSIS — K219 Gastro-esophageal reflux disease without esophagitis: Secondary | ICD-10-CM | POA: Diagnosis not present

## 2021-08-30 ENCOUNTER — Ambulatory Visit
Admission: RE | Admit: 2021-08-30 | Discharge: 2021-08-30 | Disposition: A | Discharge status: Home / Self Care | Payer: Medicare Other | Source: Ambulatory Visit | Attending: Internal Medicine | Admitting: Internal Medicine

## 2021-08-30 ENCOUNTER — Other Ambulatory Visit: Payer: Self-pay | Admitting: Internal Medicine

## 2021-08-30 DIAGNOSIS — R0989 Other specified symptoms and signs involving the circulatory and respiratory systems: Secondary | ICD-10-CM | POA: Diagnosis not present

## 2021-08-30 DIAGNOSIS — R053 Chronic cough: Secondary | ICD-10-CM | POA: Diagnosis not present

## 2021-08-30 DIAGNOSIS — R0602 Shortness of breath: Secondary | ICD-10-CM | POA: Diagnosis not present

## 2021-09-01 DIAGNOSIS — I5032 Chronic diastolic (congestive) heart failure: Secondary | ICD-10-CM | POA: Diagnosis not present

## 2021-09-01 DIAGNOSIS — I130 Hypertensive heart and chronic kidney disease with heart failure and stage 1 through stage 4 chronic kidney disease, or unspecified chronic kidney disease: Secondary | ICD-10-CM | POA: Diagnosis not present

## 2021-09-01 DIAGNOSIS — K219 Gastro-esophageal reflux disease without esophagitis: Secondary | ICD-10-CM | POA: Diagnosis not present

## 2021-09-01 DIAGNOSIS — N1831 Chronic kidney disease, stage 3a: Secondary | ICD-10-CM | POA: Diagnosis not present

## 2021-09-05 DIAGNOSIS — K219 Gastro-esophageal reflux disease without esophagitis: Secondary | ICD-10-CM | POA: Diagnosis not present

## 2021-09-05 DIAGNOSIS — I5032 Chronic diastolic (congestive) heart failure: Secondary | ICD-10-CM | POA: Diagnosis not present

## 2021-09-05 DIAGNOSIS — I130 Hypertensive heart and chronic kidney disease with heart failure and stage 1 through stage 4 chronic kidney disease, or unspecified chronic kidney disease: Secondary | ICD-10-CM | POA: Diagnosis not present

## 2021-09-05 DIAGNOSIS — N1831 Chronic kidney disease, stage 3a: Secondary | ICD-10-CM | POA: Diagnosis not present

## 2021-09-08 DIAGNOSIS — K219 Gastro-esophageal reflux disease without esophagitis: Secondary | ICD-10-CM | POA: Diagnosis not present

## 2021-09-08 DIAGNOSIS — I5032 Chronic diastolic (congestive) heart failure: Secondary | ICD-10-CM | POA: Diagnosis not present

## 2021-09-08 DIAGNOSIS — N1831 Chronic kidney disease, stage 3a: Secondary | ICD-10-CM | POA: Diagnosis not present

## 2021-09-08 DIAGNOSIS — I130 Hypertensive heart and chronic kidney disease with heart failure and stage 1 through stage 4 chronic kidney disease, or unspecified chronic kidney disease: Secondary | ICD-10-CM | POA: Diagnosis not present

## 2021-09-12 DIAGNOSIS — I5032 Chronic diastolic (congestive) heart failure: Secondary | ICD-10-CM | POA: Diagnosis not present

## 2021-09-12 DIAGNOSIS — K219 Gastro-esophageal reflux disease without esophagitis: Secondary | ICD-10-CM | POA: Diagnosis not present

## 2021-09-12 DIAGNOSIS — N1831 Chronic kidney disease, stage 3a: Secondary | ICD-10-CM | POA: Diagnosis not present

## 2021-09-12 DIAGNOSIS — I130 Hypertensive heart and chronic kidney disease with heart failure and stage 1 through stage 4 chronic kidney disease, or unspecified chronic kidney disease: Secondary | ICD-10-CM | POA: Diagnosis not present

## 2021-09-17 DIAGNOSIS — I5032 Chronic diastolic (congestive) heart failure: Secondary | ICD-10-CM | POA: Diagnosis not present

## 2021-09-17 DIAGNOSIS — N1831 Chronic kidney disease, stage 3a: Secondary | ICD-10-CM | POA: Diagnosis not present

## 2021-09-17 DIAGNOSIS — K219 Gastro-esophageal reflux disease without esophagitis: Secondary | ICD-10-CM | POA: Diagnosis not present

## 2021-09-17 DIAGNOSIS — I130 Hypertensive heart and chronic kidney disease with heart failure and stage 1 through stage 4 chronic kidney disease, or unspecified chronic kidney disease: Secondary | ICD-10-CM | POA: Diagnosis not present

## 2021-09-19 DIAGNOSIS — I130 Hypertensive heart and chronic kidney disease with heart failure and stage 1 through stage 4 chronic kidney disease, or unspecified chronic kidney disease: Secondary | ICD-10-CM | POA: Diagnosis not present

## 2021-09-19 DIAGNOSIS — N1831 Chronic kidney disease, stage 3a: Secondary | ICD-10-CM | POA: Diagnosis not present

## 2021-09-19 DIAGNOSIS — I5032 Chronic diastolic (congestive) heart failure: Secondary | ICD-10-CM | POA: Diagnosis not present

## 2021-09-19 DIAGNOSIS — K219 Gastro-esophageal reflux disease without esophagitis: Secondary | ICD-10-CM | POA: Diagnosis not present

## 2021-09-22 DIAGNOSIS — N1831 Chronic kidney disease, stage 3a: Secondary | ICD-10-CM | POA: Diagnosis not present

## 2021-09-22 DIAGNOSIS — I5032 Chronic diastolic (congestive) heart failure: Secondary | ICD-10-CM | POA: Diagnosis not present

## 2021-09-22 DIAGNOSIS — K219 Gastro-esophageal reflux disease without esophagitis: Secondary | ICD-10-CM | POA: Diagnosis not present

## 2021-09-22 DIAGNOSIS — I130 Hypertensive heart and chronic kidney disease with heart failure and stage 1 through stage 4 chronic kidney disease, or unspecified chronic kidney disease: Secondary | ICD-10-CM | POA: Diagnosis not present

## 2021-09-27 DIAGNOSIS — I10 Essential (primary) hypertension: Secondary | ICD-10-CM | POA: Diagnosis not present

## 2021-09-27 DIAGNOSIS — G6289 Other specified polyneuropathies: Secondary | ICD-10-CM | POA: Diagnosis not present

## 2021-09-27 DIAGNOSIS — Z0000 Encounter for general adult medical examination without abnormal findings: Secondary | ICD-10-CM | POA: Diagnosis not present

## 2021-09-27 DIAGNOSIS — N1831 Chronic kidney disease, stage 3a: Secondary | ICD-10-CM | POA: Diagnosis not present

## 2021-09-27 DIAGNOSIS — E782 Mixed hyperlipidemia: Secondary | ICD-10-CM | POA: Diagnosis not present

## 2021-09-27 DIAGNOSIS — I129 Hypertensive chronic kidney disease with stage 1 through stage 4 chronic kidney disease, or unspecified chronic kidney disease: Secondary | ICD-10-CM | POA: Diagnosis not present

## 2021-09-27 DIAGNOSIS — K219 Gastro-esophageal reflux disease without esophagitis: Secondary | ICD-10-CM | POA: Diagnosis not present

## 2021-09-27 DIAGNOSIS — R413 Other amnesia: Secondary | ICD-10-CM | POA: Diagnosis not present

## 2021-09-27 DIAGNOSIS — G4733 Obstructive sleep apnea (adult) (pediatric): Secondary | ICD-10-CM | POA: Diagnosis not present

## 2021-09-29 DIAGNOSIS — D485 Neoplasm of uncertain behavior of skin: Secondary | ICD-10-CM | POA: Diagnosis not present

## 2021-09-29 DIAGNOSIS — I129 Hypertensive chronic kidney disease with stage 1 through stage 4 chronic kidney disease, or unspecified chronic kidney disease: Secondary | ICD-10-CM | POA: Diagnosis not present

## 2021-09-29 DIAGNOSIS — L578 Other skin changes due to chronic exposure to nonionizing radiation: Secondary | ICD-10-CM | POA: Diagnosis not present

## 2021-09-29 DIAGNOSIS — Z85828 Personal history of other malignant neoplasm of skin: Secondary | ICD-10-CM | POA: Diagnosis not present

## 2021-09-29 DIAGNOSIS — L820 Inflamed seborrheic keratosis: Secondary | ICD-10-CM | POA: Diagnosis not present

## 2021-09-29 DIAGNOSIS — L03011 Cellulitis of right finger: Secondary | ICD-10-CM | POA: Diagnosis not present

## 2021-09-29 DIAGNOSIS — L821 Other seborrheic keratosis: Secondary | ICD-10-CM | POA: Diagnosis not present

## 2021-10-25 DIAGNOSIS — I10 Essential (primary) hypertension: Secondary | ICD-10-CM | POA: Diagnosis not present

## 2021-10-25 DIAGNOSIS — G8929 Other chronic pain: Secondary | ICD-10-CM | POA: Diagnosis not present

## 2021-10-25 DIAGNOSIS — N1831 Chronic kidney disease, stage 3a: Secondary | ICD-10-CM | POA: Diagnosis not present

## 2021-10-25 DIAGNOSIS — K219 Gastro-esophageal reflux disease without esophagitis: Secondary | ICD-10-CM | POA: Diagnosis not present

## 2021-10-25 DIAGNOSIS — E782 Mixed hyperlipidemia: Secondary | ICD-10-CM | POA: Diagnosis not present

## 2021-11-24 DIAGNOSIS — E782 Mixed hyperlipidemia: Secondary | ICD-10-CM | POA: Diagnosis not present

## 2021-11-24 DIAGNOSIS — K219 Gastro-esophageal reflux disease without esophagitis: Secondary | ICD-10-CM | POA: Diagnosis not present

## 2021-11-24 DIAGNOSIS — I10 Essential (primary) hypertension: Secondary | ICD-10-CM | POA: Diagnosis not present

## 2021-11-24 DIAGNOSIS — N1831 Chronic kidney disease, stage 3a: Secondary | ICD-10-CM | POA: Diagnosis not present

## 2021-11-25 ENCOUNTER — Telehealth: Payer: Self-pay

## 2021-12-28 ENCOUNTER — Ambulatory Visit: Payer: Medicare Other | Admitting: Podiatry

## 2022-01-10 ENCOUNTER — Ambulatory Visit: Payer: Medicare Other | Admitting: Podiatry

## 2022-01-13 DIAGNOSIS — G4733 Obstructive sleep apnea (adult) (pediatric): Secondary | ICD-10-CM | POA: Diagnosis not present

## 2022-01-13 DIAGNOSIS — R509 Fever, unspecified: Secondary | ICD-10-CM | POA: Diagnosis not present

## 2022-01-13 DIAGNOSIS — N390 Urinary tract infection, site not specified: Secondary | ICD-10-CM | POA: Diagnosis not present

## 2022-01-13 DIAGNOSIS — E875 Hyperkalemia: Secondary | ICD-10-CM | POA: Diagnosis not present

## 2022-01-13 DIAGNOSIS — I5032 Chronic diastolic (congestive) heart failure: Secondary | ICD-10-CM | POA: Diagnosis not present

## 2022-01-15 ENCOUNTER — Encounter (HOSPITAL_COMMUNITY): Payer: Self-pay

## 2022-01-15 ENCOUNTER — Ambulatory Visit (HOSPITAL_COMMUNITY)
Admission: EM | Admit: 2022-01-15 | Discharge: 2022-01-15 | Disposition: A | Discharge status: Home / Self Care | Payer: Medicare Other | Attending: Physician Assistant | Admitting: Physician Assistant

## 2022-01-15 ENCOUNTER — Telehealth (HOSPITAL_COMMUNITY): Payer: Self-pay

## 2022-01-15 DIAGNOSIS — L03031 Cellulitis of right toe: Secondary | ICD-10-CM | POA: Diagnosis not present

## 2022-01-15 DIAGNOSIS — L03032 Cellulitis of left toe: Secondary | ICD-10-CM

## 2022-01-15 MED ORDER — DOXYCYCLINE HYCLATE 100 MG PO CAPS: 100.0000 mg | ORAL_CAPSULE | Freq: Two times a day (BID) | ORAL | 0 refills | Status: DC

## 2022-01-15 NOTE — ED Triage Notes (Signed)
Patient having redness and swelling in the foot. H/o cellulitis in the leg.  No fever or chills.

## 2022-01-15 NOTE — ED Provider Notes (Signed)
[place]    CSN: [id number] Arrival date & time: 01/15/22  1741      History   Chief Complaint Chief Complaint  Patient presents with   Toe Pain    HPI Timothy Khan is a 88 y.o. male.   88 year old male presents with redness and swelling of the left big toe.  Patient indicates for the past couple days he has noticed the redness occurred on his left big toe which has worsened.  He indicates that it has mild swelling, pain present.  Patient indicates that he has neuropathy and is difficult to tell because the feeling is dulled.  Patient indicates that he is not having fever, chills, and no history of trauma to the area. Patient indicates there is a sore area and discolored skin on the top of the toe.   Toe Pain    Past Medical History:  Diagnosis Date   Abnormal brain MRI    Right side   Basal cell carcinoma of back    BPH (benign prostatic hyperplasia)    Chronic cough    CKD (chronic kidney disease), stage III ([place])    Concussion 1950   Baseball injury   Diastolic dysfunction    ED (erectile dysfunction)    Foot drop, bilateral 05/02/2013   Gait disorder    Gait disturbance    Gastroesophageal reflux disease    Hyperlipidemia    Hypertension    Libido, decreased    Lumbago    Memory loss    Onychomycosis    OSA (obstructive sleep apnea)    Peripheral neuropathy    PSA elevation    SDH (subdural hematoma) ([place]) 04/30/2013   Skull fracture ([place])    Skull fracture ([place])    Sleep apnea     Patient Active Problem List   Diagnosis Date Noted   Generalized weakness 06/06/2021   Urinary tract infection 06/05/2021   Fever 06/04/2021   Chronic diastolic CHF (congestive heart failure) ([place]) 06/03/2021   Hyperbilirubinemia 06/03/2021   Elevated alkaline phosphatase level 06/03/2021   Pancreatitis 06/02/2021   Transaminitis    Elevated LFTs -cannot rule out retained/de novo biliary stone 03/12/2021   Sepsis with acute organ dysfunction  without septic shock ([place]) 03/12/2021   Acute renal failure superimposed on stage 3a chronic kidney disease ([place]) 03/01/2021   Former smoker 03/01/2021   Family history of early CAD 03/01/2021   OSA (obstructive sleep apnea) 07/25/2020   Acute pancreatitis 06/06/2018   BPH (benign prostatic hyperplasia) 06/06/2018   LBBB (left bundle branch block) 06/06/2018   GERD (gastroesophageal reflux disease) 06/06/2018   Hiatal hernia 06/06/2018   Foot drop, bilateral 05/02/2013   Polyneuropathy 04/30/2013   Hereditary and idiopathic peripheral neuropathy 03/18/2012   Abnormality of gait 03/18/2012   Hypertension 03/18/2012    Past Surgical History:  Procedure Laterality Date   BIOPSY  03/01/2020   Procedure: BIOPSY;  Surgeon: Ronnette Juniper, MD;  Location: [facility];  Service: Gastroenterology;;   CATARACT EXTRACTION Bilateral    ENDOSCOPIC RETROGRADE CHOLANGIOPANCREATOGRAPHY (ERCP) WITH PROPOFOL N/A 07/05/2021   Procedure: ENDOSCOPIC RETROGRADE CHOLANGIOPANCREATOGRAPHY (ERCP) WITH PROPOFOL;  Surgeon: Ronnette Juniper, MD;  [facility]: [facility];  Service: Gastroenterology;  Laterality: N/A;   ESOPHAGEAL MANOMETRY N/A 01/05/2020   Procedure: ESOPHAGEAL MANOMETRY (EM);  Surgeon: Ronnette Juniper, MD;  [facility];  Service: Gastroenterology;  Laterality: N/A;   ESOPHAGOGASTRODUODENOSCOPY (EGD) WITH PROPOFOL N/A 06/11/2018   Procedure: ESOPHAGOGASTRODUODENOSCOPY (EGD) WITH PROPOFOL;  Surgeon: Clarene Essex, MD;  Location:  [facility];  Service: Endoscopy;  Laterality: N/A;   ESOPHAGOGASTRODUODENOSCOPY (EGD) WITH PROPOFOL N/A 03/01/2020   Procedure: ESOPHAGOGASTRODUODENOSCOPY (EGD) WITH PROPOFOL With Botox injections;  Surgeon: Ronnette Juniper, MD;  [facility];  Service: Gastroenterology;  Laterality: N/A;   KNEE SURGERY Left    LAPAROSCOPIC CHOLECYSTECTOMY SINGLE SITE WITH INTRAOPERATIVE CHOLANGIOGRAM N/A 01/28/2021   Procedure: Laparscopic Cholecystectomy;  Drainage of empyema of  gallbladder; Oversew of cystic duct with omentopexy;  Surgeon: Michael Boston, MD;  Location: [facility];  Service: General;  Laterality: N/A;   REMOVAL OF STONES  07/05/2021   Procedure: REMOVAL OF STONES;  Surgeon: Ronnette Juniper, MD;  [facility]: [facility];  Service: Gastroenterology;;   skin cancer resection     basal cell, [place] carcinoma   SPHINCTEROTOMY  07/05/2021   Procedure: SPHINCTEROTOMY;  Surgeon: Ronnette Juniper, MD;  Location: [facility];  Service: Gastroenterology;;   Lia Foyer INJECTION  03/01/2020   Procedure: SUBMUCOSAL INJECTION;  Surgeon: Ronnette Juniper, MD;  Location: [facility];  Service: Gastroenterology;;   TONSILLECTOMY     VASECTOMY         Home Medications    Prior to Admission medications   Medication Sig Start Date End Date Taking? Authorizing Provider  acetaminophen (TYLENOL) 500 MG tablet Take 1,000 mg by mouth every 6 (six) hours as needed for moderate pain.   Yes [provider]  amLODipine (NORVASC) 5 MG tablet Take 5 mg by mouth at bedtime. 06/24/18  Yes [provider]  cetirizine (ZYRTEC) 10 MG tablet Take 10 mg by mouth at bedtime.    Yes [provider]  Cholecalciferol (VITAMIN D3) 2000 UNITS capsule Take 2,000 Units by mouth 3 (three) times a week.   Yes [provider]  Coenzyme Q10 (CO Q-10) 300 MG CAPS Take 300 mg by mouth daily.   Yes [provider]  Cyanocobalamin (B-12) 5000 MCG CAPS Take 5,000 mcg by mouth once a week.   Yes [provider]  diclofenac Sodium (VOLTAREN) 1 % GEL Apply 2 g topically 4 (four) times daily. Patient taking differently: Apply 2 g topically 4 (four) times daily as needed (pain). 06/06/21  Yes Mercy Riding, MD  esomeprazole (NEXIUM) 20 MG capsule Take 20 mg by mouth daily.    Yes [provider]  fluocinonide (LIDEX) 0.05 % external solution Apply 1 application. topically 2 (two) times daily. 20 drops or 1 ml on the scalp 02/13/20  Yes [provider]   fluticasone (FLONASE) 50 MCG/ACT nasal spray Place 2 sprays into both nostrils at bedtime.  04/16/13  Yes [provider]  gabapentin (NEURONTIN) 300 MG capsule Take 2 capsules (600 mg total) by mouth at bedtime. 12/04/17  Yes Kathrynn Ducking, MD  mometasone (ELOCON) 0.1 % cream Apply 1 application. topically daily.   Yes [provider]  ondansetron (ZOFRAN) 4 MG tablet Take 4 mg by mouth 3 (three) times daily as needed for nausea or vomiting. 01/05/20  Yes [provider]  saccharomyces boulardii (FLORASTOR) 250 MG capsule Take 250 mg by mouth 2 (two) times daily.   Yes [provider]  senna-docusate (SENOKOT-S) 8.6-50 MG tablet Take 2 tablets by mouth daily as needed for mild constipation.   Yes [provider]  solifenacin (VESICARE) 5 MG tablet Take 5 mg by mouth every morning. 03/02/21  Yes [provider]  tamsulosin (FLOMAX) 0.4 MG CAPS Take 0.4 mg by mouth daily.   Yes [provider]  traMADol (ULTRAM) 50 MG tablet Take  1-2 tablets (50-100 mg total) by mouth daily. Patient taking differently: Take 50 mg by mouth 2 (two) times daily. 06/06/21  Yes Mercy Riding, MD  triamcinolone lotion (KENALOG) 0.1 % Apply 1 application. topically daily. 02/04/21  Yes [provider]  cefadroxil (DURICEF) 500 MG capsule Take 1 capsule (500 mg total) by mouth 2 (two) times daily. 06/06/21   Mercy Riding, MD  doxycycline (VIBRAMYCIN) 100 MG capsule Take 1 capsule (100 mg total) by mouth 2 (two) times daily. 01/15/22   Nyoka Lint, PA-C    Family History Family History  Problem Relation Age of Onset   Cancer Mother        Breast cancer   Heart attack Father 60   CAD Father    Heart disease Brother    Lupus Daughter     Social History Social History   Tobacco Use   Smoking status: Former    Types: Cigarettes    Quit date: 08/18/1984    Years since quitting: 37.4   Smokeless tobacco: Never  Vaping Use   Vaping Use:  Never used  Substance Use Topics   Alcohol use: No   Drug use: No     Allergies   Doxazosin mesylate, Lisinopril, Amoxicillin-pot clavulanate, Codeine, and Macrolides and ketolides   Review of Systems Review of Systems  Skin:  Positive for rash (left big toe redness).     Physical Exam Triage Vital Signs ED Triage Vitals  Enc Vitals Group     BP 01/15/22 1829 (!) 148/77     Pulse Rate 01/15/22 1829 86     Resp 01/15/22 1829 16     Temp 01/15/22 1829 98.1 F (36.7 C)     Temp Source 01/15/22 1829 Oral     SpO2 01/15/22 1829 96 %     Weight --      Height --      Head Circumference --      Peak Flow --      Pain Score 01/15/22 1828 5     Pain Loc --      Pain Edu? --      Excl. in [place]? --    No data found.  Updated Vital Signs BP (!) 148/77 (BP Location: Right Arm)   Pulse 86   Temp 98.1 F (36.7 C) (Oral)   Resp 16   SpO2 96%   Visual Acuity Right Eye Distance:   Left Eye Distance:   Bilateral Distance:    Right Eye Near:   Left Eye Near:    Bilateral Near:     Physical Exam Constitutional:      Appearance: Normal appearance.  Skin:    Comments: Left foot: There is redness at the big toe with 1+ swelling from the distal MTP area to the beginning of the nail margin.  There is mild a braised area present, no drainage.  The rest of the left foot appears normal  Neurological:     Mental Status: He is alert.      UC Treatments / Results  Labs (all labs ordered are listed, but only abnormal results are displayed) Labs Reviewed - No data to display  EKG   Radiology No results found.  Procedures Procedures (including critical care time)  Medications Ordered in UC Medications - No data to display  Initial Impression / Assessment and Plan / UC Course  I have reviewed the triage vital signs and the nursing notes.  Pertinent labs & imaging results  that were available during my care of the patient were reviewed by me and considered in my medical  decision making (see chart for details).    Plan: 1.  Cellulitis of the left toe will be treated with the following: A.  Doxycycline 100 mg twice daily to treat the infection. 2.  Advised follow-up PCP or return to urgent care if symptoms fail to improve.  Final Clinical Impressions(s) / UC Diagnoses   Final diagnoses:  Cellulitis of left toe     Discharge Instructions      Advised take the doxycycline 100 mg twice daily until completed to treat the infection. Advised take Tylenol as needed for pain. Advised to return if symptoms fail to improve or the infection worsens.    ED Prescriptions     Medication Sig Dispense Auth. Provider   doxycycline (VIBRAMYCIN) 100 MG capsule Take 1 capsule (100 mg total) by mouth 2 (two) times daily. 20 capsule Nyoka Lint, PA-C      PDMP not reviewed this encounter.   Nyoka Lint, PA-C 01/15/22 1845

## 2022-01-15 NOTE — Discharge Instructions (Signed)
Advised take the doxycycline 100 mg twice daily until completed to treat the infection. Advised take Tylenol as needed for pain. Advised to return if symptoms fail to improve or the infection worsens.

## 2022-01-16 DIAGNOSIS — N1831 Chronic kidney disease, stage 3a: Secondary | ICD-10-CM | POA: Diagnosis not present

## 2022-01-16 DIAGNOSIS — E782 Mixed hyperlipidemia: Secondary | ICD-10-CM | POA: Diagnosis not present

## 2022-01-16 DIAGNOSIS — K219 Gastro-esophageal reflux disease without esophagitis: Secondary | ICD-10-CM | POA: Diagnosis not present

## 2022-01-16 DIAGNOSIS — I10 Essential (primary) hypertension: Secondary | ICD-10-CM | POA: Diagnosis not present

## 2022-01-16 DIAGNOSIS — G8929 Other chronic pain: Secondary | ICD-10-CM | POA: Diagnosis not present

## 2022-01-25 ENCOUNTER — Ambulatory Visit: Payer: Medicare Other | Admitting: Podiatry

## 2022-01-25 DIAGNOSIS — I10 Essential (primary) hypertension: Secondary | ICD-10-CM | POA: Diagnosis not present

## 2022-01-25 DIAGNOSIS — I8312 Varicose veins of left lower extremity with inflammation: Secondary | ICD-10-CM | POA: Diagnosis not present

## 2022-01-25 DIAGNOSIS — L03119 Cellulitis of unspecified part of limb: Secondary | ICD-10-CM | POA: Diagnosis not present

## 2022-01-25 DIAGNOSIS — I129 Hypertensive chronic kidney disease with stage 1 through stage 4 chronic kidney disease, or unspecified chronic kidney disease: Secondary | ICD-10-CM | POA: Diagnosis not present

## 2022-01-25 DIAGNOSIS — N1831 Chronic kidney disease, stage 3a: Secondary | ICD-10-CM | POA: Diagnosis not present

## 2022-01-25 DIAGNOSIS — K219 Gastro-esophageal reflux disease without esophagitis: Secondary | ICD-10-CM | POA: Diagnosis not present

## 2022-01-25 DIAGNOSIS — E782 Mixed hyperlipidemia: Secondary | ICD-10-CM | POA: Diagnosis not present

## 2022-01-31 DIAGNOSIS — I129 Hypertensive chronic kidney disease with stage 1 through stage 4 chronic kidney disease, or unspecified chronic kidney disease: Secondary | ICD-10-CM | POA: Diagnosis not present

## 2022-01-31 DIAGNOSIS — R600 Localized edema: Secondary | ICD-10-CM | POA: Diagnosis not present

## 2022-01-31 DIAGNOSIS — R413 Other amnesia: Secondary | ICD-10-CM | POA: Diagnosis not present

## 2022-01-31 DIAGNOSIS — N1831 Chronic kidney disease, stage 3a: Secondary | ICD-10-CM | POA: Diagnosis not present

## 2022-02-03 DIAGNOSIS — R600 Localized edema: Secondary | ICD-10-CM | POA: Diagnosis not present

## 2022-02-08 DIAGNOSIS — R600 Localized edema: Secondary | ICD-10-CM | POA: Diagnosis not present

## 2022-02-08 DIAGNOSIS — I739 Peripheral vascular disease, unspecified: Secondary | ICD-10-CM | POA: Diagnosis not present

## 2022-02-08 DIAGNOSIS — L03116 Cellulitis of left lower limb: Secondary | ICD-10-CM | POA: Diagnosis not present

## 2022-02-13 ENCOUNTER — Ambulatory Visit: Payer: Medicare Other | Admitting: Podiatry

## 2022-02-22 ENCOUNTER — Ambulatory Visit: Payer: Medicare Other | Admitting: Podiatry

## 2022-02-22 ENCOUNTER — Encounter: Payer: Self-pay | Admitting: Podiatry

## 2022-02-22 VITALS — BP 136/71 | HR 89

## 2022-02-22 DIAGNOSIS — K219 Gastro-esophageal reflux disease without esophagitis: Secondary | ICD-10-CM | POA: Diagnosis not present

## 2022-02-22 DIAGNOSIS — I10 Essential (primary) hypertension: Secondary | ICD-10-CM | POA: Diagnosis not present

## 2022-02-22 DIAGNOSIS — R234 Changes in skin texture: Secondary | ICD-10-CM

## 2022-02-22 DIAGNOSIS — E782 Mixed hyperlipidemia: Secondary | ICD-10-CM | POA: Diagnosis not present

## 2022-02-22 DIAGNOSIS — N1831 Chronic kidney disease, stage 3a: Secondary | ICD-10-CM | POA: Diagnosis not present

## 2022-02-22 NOTE — Progress Notes (Signed)
Chief Complaint  Patient presents with   Nail Problem    Patient is here for right foot 2nd toe, left great toe has skin cracked in between toes.    HPI: 88 y.o. male presenting today as a reestablish new patient for evaluation of thick disfigured nail to the right second digit.  Patient is concerned because it is causing some redness around the distal tip of the toe.  He has a history of cellulitis left leg and recently just finished antibiotics for this. Patient also noticed some fissuring of the skin between the left great toe and second digit.  He has not done anything for treatment.  He does wear compression hose which seem to compress his toes  Past Medical History:  Diagnosis Date   Abnormal brain MRI    Right side   Basal cell carcinoma of back    BPH (benign prostatic hyperplasia)    Chronic cough    CKD (chronic kidney disease), stage III ([place])    Concussion 1950   Baseball injury   Diastolic dysfunction    ED (erectile dysfunction)    Foot drop, bilateral 05/02/2013   Gait disorder    Gait disturbance    Gastroesophageal reflux disease    Hyperlipidemia    Hypertension    Libido, decreased    Lumbago    Memory loss    Onychomycosis    OSA (obstructive sleep apnea)    Peripheral neuropathy    PSA elevation    SDH (subdural hematoma) ([place]) 04/30/2013   Skull fracture ([place])    Skull fracture ([place])    Sleep apnea     Past Surgical History:  Procedure Laterality Date   BIOPSY  03/01/2020   Procedure: BIOPSY;  Surgeon: Ronnette Juniper, MD;  [facility];  Service: Gastroenterology;;   CATARACT EXTRACTION Bilateral    ENDOSCOPIC RETROGRADE CHOLANGIOPANCREATOGRAPHY (ERCP) WITH PROPOFOL N/A 07/05/2021   Procedure: ENDOSCOPIC RETROGRADE CHOLANGIOPANCREATOGRAPHY (ERCP) WITH PROPOFOL;  Surgeon: Ronnette Juniper, MD;  [facility];  Service: Gastroenterology;  Laterality: N/A;   ESOPHAGEAL MANOMETRY N/A 01/05/2020   Procedure: ESOPHAGEAL MANOMETRY (EM);   Surgeon: Ronnette Juniper, MD;  [facility];  Service: Gastroenterology;  Laterality: N/A;   ESOPHAGOGASTRODUODENOSCOPY (EGD) WITH PROPOFOL N/A 06/11/2018   Procedure: ESOPHAGOGASTRODUODENOSCOPY (EGD) WITH PROPOFOL;  Surgeon: Clarene Essex, MD;  [facility];  Service: Endoscopy;  Laterality: N/A;   ESOPHAGOGASTRODUODENOSCOPY (EGD) WITH PROPOFOL N/A 03/01/2020   Procedure: ESOPHAGOGASTRODUODENOSCOPY (EGD) WITH PROPOFOL With Botox injections;  Surgeon: Ronnette Juniper, MD;  [facility];  Service: Gastroenterology;  Laterality: N/A;   KNEE SURGERY Left    LAPAROSCOPIC CHOLECYSTECTOMY SINGLE SITE WITH INTRAOPERATIVE CHOLANGIOGRAM N/A 01/28/2021   Procedure: Laparscopic Cholecystectomy;  Drainage of empyema of gallbladder; Oversew of cystic duct with omentopexy;  Surgeon: Michael Boston, MD;  [facility];  Service: General;  Laterality: N/A;   REMOVAL OF STONES  07/05/2021   Procedure: REMOVAL OF STONES;  Surgeon: Ronnette Juniper, MD;  [facility];  Service: Gastroenterology;;   skin cancer resection     basal cell, [place] carcinoma   SPHINCTEROTOMY  07/05/2021   Procedure: SPHINCTEROTOMY;  Surgeon: Ronnette Juniper, MD;  Location: [facility];  Service: Gastroenterology;;   Lia Foyer INJECTION  03/01/2020   Procedure: SUBMUCOSAL INJECTION;  Surgeon: Ronnette Juniper, MD;  [facility];  Service: Gastroenterology;;   TONSILLECTOMY     VASECTOMY      Allergies  Allergen Reactions   Doxazosin Mesylate     fatigue  Lisinopril Cough   Amoxicillin-Pot Clavulanate Rash   Codeine Nausea Only   Macrolides And Ketolides Rash     Physical Exam: General: The patient is alert and oriented x3 in no acute distress.  Dermatology: Superficial fissure of skin noted to the first interdigital webspace left foot with some drainage and maceration to the area. Hyperkeratotic dystrophic nail noted to the right second digit  Vascular: Palpable pedal pulses bilaterally. Capillary  refill within normal limits.  Negative for any significant edema or erythema  Neurological: Absent via light touch  Musculoskeletal Exam: No pedal deformities noted.  No prior amputations  Assessment: 1.  Dystrophic nail right second digit 2.  Superficial fissure of skin first interdigital webspace left   Plan of Care:  1. Patient evaluated.  2.  Mechanical debridement of the nail plate was performed to the right second digit.  No incident or bleeding noted.  Performed using a nail nipper as a courtesy for the patient 3.  In regards to the fissure of skin to the interdigital webspace, recommend Candee Furbish paint which was applied today to the interdigital area.  Apply daily 4.  Also recommend open toed compression hose so it does not constrict the toebox area or press the toes together which may be creating the fissuring and maceration of the skin in between the toes left foot 5.  Return to clinic as needed      Timothy Khan, DPM [facility]  Dr. Edrick Khan, DPM    [address], [place] [address]                Office [phone]  Fax [phone]

## 2022-03-09 DIAGNOSIS — L03116 Cellulitis of left lower limb: Secondary | ICD-10-CM | POA: Diagnosis not present

## 2022-03-09 DIAGNOSIS — R600 Localized edema: Secondary | ICD-10-CM | POA: Diagnosis not present

## 2022-03-09 DIAGNOSIS — I739 Peripheral vascular disease, unspecified: Secondary | ICD-10-CM | POA: Diagnosis not present

## 2022-03-22 ENCOUNTER — Other Ambulatory Visit: Payer: Self-pay | Admitting: *Deleted

## 2022-03-22 DIAGNOSIS — L03116 Cellulitis of left lower limb: Secondary | ICD-10-CM

## 2022-03-22 NOTE — Progress Notes (Unsigned)
[facility]           OF [place]  History and Physical   Timothy Khan is a 89 y.o. male who presents with his caretaker with complaints of BLE swelling with left worse than right.  She tells me that over the past year, he has gotten several bouts of cellulitis of the left lower leg.  She states that his PCP will put him on abx and it will improve but lately, those abx are not working as well.  The pt is wearing 15-20 mmHg knee high compression socks.  He has never had a DVT.   His caretaker tells me that she used to do lymphatic massage on the left leg, which was really helpful but stopped doing this as she was worried about his skin.  He does not get any claudication symptoms nor does he have non healing wounds.  Sometimes he gets small ulcerations on his leg from the swelling but this have always healed.  He does take tramadol for throbbing pains in his feet at night.  He has hx of CKD and CAD.   Echo in November 2022 revealed grade one diastolic dysfunction.   He states that his dad had a blood clot to his heart in his 40's and passed away.  There is no family hx of AAA that he is aware of.     The pt is not on a statin for cholesterol management.  The pt is not on a daily aspirin.   Other AC:  none The pt is on CCB for hypertension.   The pt is not diabetic.   Tobacco hx:  former   Past Medical History:  Diagnosis Date   Abnormal brain MRI    Right side   Basal cell carcinoma of back    BPH (benign prostatic hyperplasia)    Chronic cough    CKD (chronic kidney disease), stage III ([place])    Concussion 1950   Baseball injury   Diastolic dysfunction    ED (erectile dysfunction)    Foot drop, bilateral 05/02/2013   Gait disorder    Gait disturbance    Gastroesophageal reflux disease    Hyperlipidemia    Hypertension    Libido, decreased    Lumbago    Memory loss    Onychomycosis    OSA (obstructive sleep apnea)    Peripheral neuropathy     PSA elevation    SDH (subdural hematoma) ([place]) 04/30/2013   Skull fracture ([place])    Skull fracture ([place])    Sleep apnea     Past Surgical History:  Procedure Laterality Date   BIOPSY  03/01/2020   Procedure: BIOPSY;  Surgeon: Ronnette Juniper, MD;  Location: [facility];  Service: Gastroenterology;;   CATARACT EXTRACTION Bilateral    ENDOSCOPIC RETROGRADE CHOLANGIOPANCREATOGRAPHY (ERCP) WITH PROPOFOL N/A 07/05/2021   Procedure: ENDOSCOPIC RETROGRADE CHOLANGIOPANCREATOGRAPHY (ERCP) WITH PROPOFOL;  Surgeon: Ronnette Juniper, MD;  Location: [facility];  Service: Gastroenterology;  Laterality: N/A;   ESOPHAGEAL MANOMETRY N/A 01/05/2020   Procedure: ESOPHAGEAL MANOMETRY (EM);  Surgeon: Ronnette Juniper, MD;  Location: [facility];  Service: Gastroenterology;  Laterality: N/A;   ESOPHAGOGASTRODUODENOSCOPY (EGD) WITH PROPOFOL N/A 06/11/2018   Procedure: ESOPHAGOGASTRODUODENOSCOPY (EGD) WITH PROPOFOL;  Surgeon: Clarene Essex, MD;  Location: [facility];  Service: Endoscopy;  Laterality: N/A;   ESOPHAGOGASTRODUODENOSCOPY (EGD) WITH PROPOFOL N/A 03/01/2020   Procedure: ESOPHAGOGASTRODUODENOSCOPY (EGD) WITH PROPOFOL With Botox injections;  Surgeon: Ronnette Juniper,  MD;  Location: [facility];  Service: Gastroenterology;  Laterality: N/A;   KNEE SURGERY Left    LAPAROSCOPIC CHOLECYSTECTOMY SINGLE SITE WITH INTRAOPERATIVE CHOLANGIOGRAM N/A 01/28/2021   Procedure: Laparscopic Cholecystectomy;  Drainage of empyema of gallbladder; Oversew of cystic duct with omentopexy;  Surgeon: Michael Boston, MD;  Location: [facility];  Service: General;  Laterality: N/A;   REMOVAL OF STONES  07/05/2021   Procedure: REMOVAL OF STONES;  Surgeon: Ronnette Juniper, MD;  Location: [facility];  Service: Gastroenterology;;   skin cancer resection     basal cell, [place] carcinoma   SPHINCTEROTOMY  07/05/2021   Procedure: SPHINCTEROTOMY;  Surgeon: Ronnette Juniper, MD;  Location: [facility];  Service: Gastroenterology;;   SUBMUCOSAL INJECTION  03/01/2020    Procedure: SUBMUCOSAL INJECTION;  Surgeon: Ronnette Juniper, MD;  Location: [facility];  Service: Gastroenterology;;   TONSILLECTOMY     VASECTOMY      Social History   Socioeconomic History   Marital status: Married    Spouse name: Not on file   Number of children: 4   Years of education: 84   Highest education level: Not on file  Occupational History   Occupation: Retired  Tobacco Use   Smoking status: Former    Types: Cigarettes    Quit date: 08/18/1984    Years since quitting: 37.6   Smokeless tobacco: Never  Vaping Use   Vaping Use: Never used  Substance and Sexual Activity   Alcohol use: No   Drug use: No   Sexual activity: Not on file  Other Topics Concern   Not on file  Social History Narrative   Patient states he is ambidextrous but is predominantly left handed.   Patient drinks 3 cups of coffee per day.   Social Determinants of Health   Financial Resource Strain: Not on file  Food Insecurity: No Food Insecurity (11/25/2021)   Hunger Vital Sign    Worried About Running Out of Food in the Last Year: Never true    Ran Out of Food in the Last Year: Never true  Transportation Needs: No Transportation Needs (11/25/2021)   PRAPARE - Hydrologist (Medical): No    Lack of Transportation (Non-Medical): No  Physical Activity: Not on file  Stress: Not on file  Social Connections: Not on file  Intimate Partner Violence: Not on file     Family History  Problem Relation Age of Onset   Cancer Mother        Breast cancer   Heart attack Father 1   CAD Father    Heart disease Brother    Lupus Daughter     Current Outpatient Medications  Medication Sig Dispense Refill   acetaminophen (TYLENOL) 500 MG tablet Take 1,000 mg by mouth every 6 (six) hours as needed for moderate pain.     amLODipine (NORVASC) 5 MG tablet Take 5 mg by mouth at bedtime.     cefadroxil (DURICEF) 500 MG capsule Take 1 capsule (500 mg total) by mouth 2 (two) times  daily. 10 capsule 0   cetirizine (ZYRTEC) 10 MG tablet Take 10 mg by mouth at bedtime.      Cholecalciferol (VITAMIN D3) 2000 UNITS capsule Take 2,000 Units by mouth 3 (three) times a week.     Coenzyme Q10 (CO Q-10) 300 MG CAPS Take 300 mg by mouth daily.     Cyanocobalamin (B-12) 5000 MCG CAPS Take 5,000 mcg by mouth once a week.     diclofenac Sodium (  VOLTAREN) 1 % GEL Apply 2 g topically 4 (four) times daily. (Patient taking differently: Apply 2 g topically 4 (four) times daily as needed (pain).) 100 g 1   doxycycline (VIBRAMYCIN) 100 MG capsule Take 1 capsule (100 mg total) by mouth 2 (two) times daily. 20 capsule 0   esomeprazole (NEXIUM) 20 MG capsule Take 20 mg by mouth daily.      fluocinonide (LIDEX) 0.05 % external solution Apply 1 application. topically 2 (two) times daily. 20 drops or 1 ml on the scalp     fluticasone (FLONASE) 50 MCG/ACT nasal spray Place 2 sprays into both nostrils at bedtime.      gabapentin (NEURONTIN) 300 MG capsule Take 2 capsules (600 mg total) by mouth at bedtime. 180 capsule 3   mometasone (ELOCON) 0.1 % cream Apply 1 application. topically daily.     ondansetron (ZOFRAN) 4 MG tablet Take 4 mg by mouth 3 (three) times daily as needed for nausea or vomiting.     saccharomyces boulardii (FLORASTOR) 250 MG capsule Take 250 mg by mouth 2 (two) times daily.     senna-docusate (SENOKOT-S) 8.6-50 MG tablet Take 2 tablets by mouth daily as needed for mild constipation.     solifenacin (VESICARE) 5 MG tablet Take 5 mg by mouth every morning.     tamsulosin (FLOMAX) 0.4 MG CAPS Take 0.4 mg by mouth daily.     traMADol (ULTRAM) 50 MG tablet Take 1-2 tablets (50-100 mg total) by mouth daily. (Patient taking differently: Take 50 mg by mouth 2 (two) times daily.) 30 tablet 0   triamcinolone lotion (KENALOG) 0.1 % Apply 1 application. topically daily.     No current facility-administered medications for this visit.    Allergies  Allergen Reactions   Doxazosin Mesylate      fatigue   Lisinopril Cough   Amoxicillin-Pot Clavulanate Rash   Codeine Nausea Only   Macrolides And Ketolides Rash    REVIEW OF SYSTEMS:   '[X]'$  denotes positive finding, '[ ]'$  denotes negative finding Cardiac  Comments:  Chest pain or chest pressure:    Shortness of breath upon exertion:    Short of breath when lying flat:    Irregular heart rhythm:        Vascular    Pain in calf, thigh, or hip brought on by ambulation:    Pain in feet at night that wakes you up from your sleep:     Blood clot in your veins:    Leg swelling:  x       Pulmonary    Oxygen at home:    Productive cough:     Wheezing:         Neurologic    Sudden weakness in arms or legs:     Sudden numbness in arms or legs:     Sudden onset of difficulty speaking or slurred speech:    Temporary loss of vision in one eye:     Problems with dizziness:         Gastrointestinal    Blood in stool:     Vomited blood:         Genitourinary    Burning when urinating:     Blood in urine:        Psychiatric    Major depression:         Hematologic    Bleeding problems:    Problems with blood clotting too easily:        Skin  Rashes or ulcers:        Constitutional    Fever or chills:      PHYSICAL EXAMINATION:  Today's Vitals   03/23/22 0836  BP: (!) 153/76  Pulse: (!) 101  Resp: 20  Temp: 98.5 F (36.9 C)  TempSrc: Temporal  SpO2: 94%  Weight: 191 lb 14.4 oz (87 kg)  Height: '5\' 10"'$  (1.778 m)  PainSc: 5   PainLoc: Leg   Body mass index is 27.53 kg/m.   General:  WDWN in NAD; vital signs documented above Gait: Not observed HENT: WNL, normocephalic Pulmonary: normal non-labored breathing without wheezing Cardiac: regular HR; without carotid bruits Abdomen: soft, NT, aortic pulse is not palpable Skin: without rashes Vascular Exam/Pulses:  Right Left  Radial 2+ (normal) 2+ (normal)  DP 2+ (normal) brisk biphasic doppler flow 2+ (normal) brisk biphasic doppler flow  PT Brisk  biphasic doppler flow Brisk biphasic doppler flow   Extremities: BLE swelling with 1-2+ pitting edema left worse than right with some erythema on the shin on the left leg.    Neurologic: A&O X 3;  moving all extremities equally Psychiatric:  The pt has Normal affect.   Non-Invasive Vascular Imaging:   Venous duplex on 03/23/2022: +--------------+---------+------+-----------+------------+-----------------  LEFT         Reflux NoRefluxReflux TimeDiameter cmsComments                                  Yes                                           +--------------+---------+------+-----------+------------+-----------------  CFV          no                                                      +--------------+---------+------+-----------+------------+-----------------   FV mid        no                                                      +--------------+---------+------+-----------+------------+-----------------  Popliteal    no                                                      +--------------+---------+------+-----------+------------+-----------------  GSV at [facility]    no                            0.59                      +--------------+---------+------+-----------+------------+-----------------  GSV prox thighno                            0.39                      +--------------+---------+------+-----------+------------+-----------------  GSV mid thigh no                            0.35    out of fascia     +--------------+---------+------+-----------+------------+-----------------  GSV dist thighno                            0.34    out of fascia     +--------------+---------+------+-----------+------------+-----------------  GSV at knee   no                            0.35                      +--------------+---------+------+-----------+------------+-----------------  SSV Pop Fossa no                            0.54     connects to popliteal  +--------------+---------+------+-----------+------------+-----------------  SSV prox calf           yes    >500 ms      0.43    chronic thrombus  +--------------+---------+------+-----------+------------+-----------------  SSV mid calf            yes    >500 ms      0.40    chronic thrombus  +--------------+---------+------+-----------+------------+-----------------  Summary:  Left:  - No evidence of deep vein thrombosis from the common femoral through the popliteal veins.  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  - The deep venous system is competent.  - The great saphenous vein is competent.  - The small saphenous vein is not competen     Timothy Khan is a 89 y.o. male who presents with: BLE swelling with left worse than right.    -pt has palpable pedal pulses bilaterally as well as brisk biphasic doppler flow bilateral DP/PT. -pt does not have evidence of DVT.  Pt does have venous reflux only in the SSV.  The deep system and GSV are competent.  He is not a candidate for laser ablation.  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  He does not take aspirin due to GI upset.  Discussed with Dr. Trula Slade and given hx of CKD, would not pursue CT venogram and not a candidate for laser ablation.   No indication for anticoagulation for this.   -discussed with pt about wearing either knee high 15-20 or 20-30 mmHg compression stockings or even try the 20-61mHg thigh high compression socks.  and pt was measured for these today.    -discussed the importance of leg elevation and how to elevate properly - pt is advised to elevate their legs and a diagram is given to them to demonstrate for pt to lay flat on their back with knees elevated and slightly bent with their feet higher than their knees, which puts their feet higher than their heart for 15 minutes per day.  If pt cannot lay flat, advised to lay as flat as possible.  -pt is  advised to continue as much walking as possible and avoid sitting or standing for long periods of time.  -discussed importance of exercise and that water aerobics would also be beneficial.  -handout with recommendations given -pt will f/u as needed.    SLeontine Locket PHaci[facility] [phone]  Clinic MD:  Scot Dock

## 2022-03-23 ENCOUNTER — Ambulatory Visit (HOSPITAL_COMMUNITY)
Admission: RE | Admit: 2022-03-23 | Discharge: 2022-03-23 | Disposition: A | Discharge status: Home / Self Care | Payer: Medicare Other | Source: Ambulatory Visit | Attending: Vascular Surgery | Admitting: Vascular Surgery

## 2022-03-23 ENCOUNTER — Ambulatory Visit: Payer: Medicare Other | Admitting: Physician Assistant

## 2022-03-23 VITALS — BP 153/76 | HR 101 | Temp 98.5°F | Resp 20 | Ht 70.0 in | Wt 191.9 lb

## 2022-03-23 DIAGNOSIS — M7989 Other specified soft tissue disorders: Secondary | ICD-10-CM

## 2022-03-23 DIAGNOSIS — H04123 Dry eye syndrome of bilateral lacrimal glands: Secondary | ICD-10-CM | POA: Diagnosis not present

## 2022-03-23 DIAGNOSIS — L03116 Cellulitis of left lower limb: Secondary | ICD-10-CM

## 2022-03-23 DIAGNOSIS — H5201 Hypermetropia, right eye: Secondary | ICD-10-CM | POA: Diagnosis not present

## 2022-03-23 DIAGNOSIS — H524 Presbyopia: Secondary | ICD-10-CM | POA: Diagnosis not present

## 2022-03-23 DIAGNOSIS — Z961 Presence of intraocular lens: Secondary | ICD-10-CM | POA: Diagnosis not present

## 2022-03-23 DIAGNOSIS — H52203 Unspecified astigmatism, bilateral: Secondary | ICD-10-CM | POA: Diagnosis not present

## 2022-04-05 DIAGNOSIS — L821 Other seborrheic keratosis: Secondary | ICD-10-CM | POA: Diagnosis not present

## 2022-04-05 DIAGNOSIS — Z85828 Personal history of other malignant neoplasm of skin: Secondary | ICD-10-CM | POA: Diagnosis not present

## 2022-04-05 DIAGNOSIS — L03011 Cellulitis of right finger: Secondary | ICD-10-CM | POA: Diagnosis not present

## 2022-04-05 DIAGNOSIS — C44319 Basal cell carcinoma of skin of other parts of face: Secondary | ICD-10-CM | POA: Diagnosis not present

## 2022-04-05 DIAGNOSIS — C44619 Basal cell carcinoma of skin of left upper limb, including shoulder: Secondary | ICD-10-CM | POA: Diagnosis not present

## 2022-04-05 DIAGNOSIS — L812 Freckles: Secondary | ICD-10-CM | POA: Diagnosis not present

## 2022-04-10 ENCOUNTER — Telehealth: Payer: Self-pay

## 2022-04-10 NOTE — Telephone Encounter (Signed)
Pt's caregiver, Chriss Czar, called requesting information on how to get an extra compression stocking.  Reviewed pt's chart, returned call for clarification, two identifiers used. Confirmed with pt that I could speak with [facility]. Informed her that anyone could come by with his size and pay for another stocking during our open office hours. Confirmed understanding.

## 2022-04-17 DIAGNOSIS — K219 Gastro-esophageal reflux disease without esophagitis: Secondary | ICD-10-CM | POA: Diagnosis not present

## 2022-04-17 DIAGNOSIS — I10 Essential (primary) hypertension: Secondary | ICD-10-CM | POA: Diagnosis not present

## 2022-04-17 DIAGNOSIS — E782 Mixed hyperlipidemia: Secondary | ICD-10-CM | POA: Diagnosis not present

## 2022-04-17 DIAGNOSIS — N1831 Chronic kidney disease, stage 3a: Secondary | ICD-10-CM | POA: Diagnosis not present

## 2022-04-17 DIAGNOSIS — I129 Hypertensive chronic kidney disease with stage 1 through stage 4 chronic kidney disease, or unspecified chronic kidney disease: Secondary | ICD-10-CM | POA: Diagnosis not present

## 2022-04-18 ENCOUNTER — Telehealth: Payer: Self-pay

## 2022-04-18 DIAGNOSIS — I8393 Asymptomatic varicose veins of bilateral lower extremities: Secondary | ICD-10-CM

## 2022-04-18 NOTE — Telephone Encounter (Signed)
Pt's friend walked into office to pickup thigh high compression stockings for pt. Pt had previously been measured. Thigh high Large 20-30 purchased.

## 2022-04-24 DIAGNOSIS — E782 Mixed hyperlipidemia: Secondary | ICD-10-CM | POA: Diagnosis not present

## 2022-04-24 DIAGNOSIS — I10 Essential (primary) hypertension: Secondary | ICD-10-CM | POA: Diagnosis not present

## 2022-04-24 DIAGNOSIS — I129 Hypertensive chronic kidney disease with stage 1 through stage 4 chronic kidney disease, or unspecified chronic kidney disease: Secondary | ICD-10-CM | POA: Diagnosis not present

## 2022-04-24 DIAGNOSIS — N1831 Chronic kidney disease, stage 3a: Secondary | ICD-10-CM | POA: Diagnosis not present

## 2022-05-23 DIAGNOSIS — I129 Hypertensive chronic kidney disease with stage 1 through stage 4 chronic kidney disease, or unspecified chronic kidney disease: Secondary | ICD-10-CM | POA: Diagnosis not present

## 2022-05-23 DIAGNOSIS — G6289 Other specified polyneuropathies: Secondary | ICD-10-CM | POA: Diagnosis not present

## 2022-05-23 DIAGNOSIS — I130 Hypertensive heart and chronic kidney disease with heart failure and stage 1 through stage 4 chronic kidney disease, or unspecified chronic kidney disease: Secondary | ICD-10-CM | POA: Diagnosis not present

## 2022-05-23 DIAGNOSIS — E782 Mixed hyperlipidemia: Secondary | ICD-10-CM | POA: Diagnosis not present

## 2022-05-23 DIAGNOSIS — N1831 Chronic kidney disease, stage 3a: Secondary | ICD-10-CM | POA: Diagnosis not present

## 2022-05-23 DIAGNOSIS — G319 Degenerative disease of nervous system, unspecified: Secondary | ICD-10-CM | POA: Diagnosis not present

## 2022-05-23 DIAGNOSIS — L03116 Cellulitis of left lower limb: Secondary | ICD-10-CM | POA: Diagnosis not present

## 2022-05-24 DIAGNOSIS — E782 Mixed hyperlipidemia: Secondary | ICD-10-CM | POA: Diagnosis not present

## 2022-05-24 DIAGNOSIS — N1831 Chronic kidney disease, stage 3a: Secondary | ICD-10-CM | POA: Diagnosis not present

## 2022-05-24 DIAGNOSIS — I129 Hypertensive chronic kidney disease with stage 1 through stage 4 chronic kidney disease, or unspecified chronic kidney disease: Secondary | ICD-10-CM | POA: Diagnosis not present

## 2022-05-24 DIAGNOSIS — I10 Essential (primary) hypertension: Secondary | ICD-10-CM | POA: Diagnosis not present

## 2022-06-15 DIAGNOSIS — G4733 Obstructive sleep apnea (adult) (pediatric): Secondary | ICD-10-CM | POA: Diagnosis not present

## 2022-06-29 DIAGNOSIS — N1831 Chronic kidney disease, stage 3a: Secondary | ICD-10-CM | POA: Diagnosis not present

## 2022-06-29 DIAGNOSIS — E782 Mixed hyperlipidemia: Secondary | ICD-10-CM | POA: Diagnosis not present

## 2022-06-29 DIAGNOSIS — I10 Essential (primary) hypertension: Secondary | ICD-10-CM | POA: Diagnosis not present

## 2022-06-29 DIAGNOSIS — I129 Hypertensive chronic kidney disease with stage 1 through stage 4 chronic kidney disease, or unspecified chronic kidney disease: Secondary | ICD-10-CM | POA: Diagnosis not present

## 2022-07-04 DIAGNOSIS — I509 Heart failure, unspecified: Secondary | ICD-10-CM | POA: Diagnosis not present

## 2022-07-04 DIAGNOSIS — I130 Hypertensive heart and chronic kidney disease with heart failure and stage 1 through stage 4 chronic kidney disease, or unspecified chronic kidney disease: Secondary | ICD-10-CM | POA: Diagnosis not present

## 2022-07-04 DIAGNOSIS — N1831 Chronic kidney disease, stage 3a: Secondary | ICD-10-CM | POA: Diagnosis not present

## 2022-07-04 DIAGNOSIS — G4733 Obstructive sleep apnea (adult) (pediatric): Secondary | ICD-10-CM | POA: Diagnosis not present

## 2022-07-06 DIAGNOSIS — I509 Heart failure, unspecified: Secondary | ICD-10-CM | POA: Diagnosis not present

## 2022-07-06 DIAGNOSIS — N1831 Chronic kidney disease, stage 3a: Secondary | ICD-10-CM | POA: Diagnosis not present

## 2022-07-06 DIAGNOSIS — G4733 Obstructive sleep apnea (adult) (pediatric): Secondary | ICD-10-CM | POA: Diagnosis not present

## 2022-07-06 DIAGNOSIS — I130 Hypertensive heart and chronic kidney disease with heart failure and stage 1 through stage 4 chronic kidney disease, or unspecified chronic kidney disease: Secondary | ICD-10-CM | POA: Diagnosis not present

## 2022-07-11 DIAGNOSIS — N1831 Chronic kidney disease, stage 3a: Secondary | ICD-10-CM | POA: Diagnosis not present

## 2022-07-11 DIAGNOSIS — I509 Heart failure, unspecified: Secondary | ICD-10-CM | POA: Diagnosis not present

## 2022-07-11 DIAGNOSIS — I130 Hypertensive heart and chronic kidney disease with heart failure and stage 1 through stage 4 chronic kidney disease, or unspecified chronic kidney disease: Secondary | ICD-10-CM | POA: Diagnosis not present

## 2022-07-11 DIAGNOSIS — G4733 Obstructive sleep apnea (adult) (pediatric): Secondary | ICD-10-CM | POA: Diagnosis not present

## 2022-07-14 DIAGNOSIS — G4733 Obstructive sleep apnea (adult) (pediatric): Secondary | ICD-10-CM | POA: Diagnosis not present

## 2022-07-14 DIAGNOSIS — N1831 Chronic kidney disease, stage 3a: Secondary | ICD-10-CM | POA: Diagnosis not present

## 2022-07-14 DIAGNOSIS — I872 Venous insufficiency (chronic) (peripheral): Secondary | ICD-10-CM | POA: Diagnosis not present

## 2022-07-14 DIAGNOSIS — I509 Heart failure, unspecified: Secondary | ICD-10-CM | POA: Diagnosis not present

## 2022-07-14 DIAGNOSIS — I130 Hypertensive heart and chronic kidney disease with heart failure and stage 1 through stage 4 chronic kidney disease, or unspecified chronic kidney disease: Secondary | ICD-10-CM | POA: Diagnosis not present

## 2022-07-18 DIAGNOSIS — N1831 Chronic kidney disease, stage 3a: Secondary | ICD-10-CM | POA: Diagnosis not present

## 2022-07-18 DIAGNOSIS — I130 Hypertensive heart and chronic kidney disease with heart failure and stage 1 through stage 4 chronic kidney disease, or unspecified chronic kidney disease: Secondary | ICD-10-CM | POA: Diagnosis not present

## 2022-07-18 DIAGNOSIS — I509 Heart failure, unspecified: Secondary | ICD-10-CM | POA: Diagnosis not present

## 2022-07-18 DIAGNOSIS — G4733 Obstructive sleep apnea (adult) (pediatric): Secondary | ICD-10-CM | POA: Diagnosis not present

## 2022-07-19 DIAGNOSIS — L308 Other specified dermatitis: Secondary | ICD-10-CM | POA: Diagnosis not present

## 2022-07-19 DIAGNOSIS — C44319 Basal cell carcinoma of skin of other parts of face: Secondary | ICD-10-CM | POA: Diagnosis not present

## 2022-07-19 DIAGNOSIS — D485 Neoplasm of uncertain behavior of skin: Secondary | ICD-10-CM | POA: Diagnosis not present

## 2022-07-19 DIAGNOSIS — C441122 Basal cell carcinoma of skin of right lower eyelid, including canthus: Secondary | ICD-10-CM | POA: Diagnosis not present

## 2022-07-19 DIAGNOSIS — L858 Other specified epidermal thickening: Secondary | ICD-10-CM | POA: Diagnosis not present

## 2022-07-19 DIAGNOSIS — Z85828 Personal history of other malignant neoplasm of skin: Secondary | ICD-10-CM | POA: Diagnosis not present

## 2022-07-20 DIAGNOSIS — I509 Heart failure, unspecified: Secondary | ICD-10-CM | POA: Diagnosis not present

## 2022-07-20 DIAGNOSIS — G4733 Obstructive sleep apnea (adult) (pediatric): Secondary | ICD-10-CM | POA: Diagnosis not present

## 2022-07-20 DIAGNOSIS — I130 Hypertensive heart and chronic kidney disease with heart failure and stage 1 through stage 4 chronic kidney disease, or unspecified chronic kidney disease: Secondary | ICD-10-CM | POA: Diagnosis not present

## 2022-07-20 DIAGNOSIS — N1831 Chronic kidney disease, stage 3a: Secondary | ICD-10-CM | POA: Diagnosis not present

## 2022-07-25 DIAGNOSIS — I130 Hypertensive heart and chronic kidney disease with heart failure and stage 1 through stage 4 chronic kidney disease, or unspecified chronic kidney disease: Secondary | ICD-10-CM | POA: Diagnosis not present

## 2022-07-25 DIAGNOSIS — G4733 Obstructive sleep apnea (adult) (pediatric): Secondary | ICD-10-CM | POA: Diagnosis not present

## 2022-07-25 DIAGNOSIS — I509 Heart failure, unspecified: Secondary | ICD-10-CM | POA: Diagnosis not present

## 2022-07-25 DIAGNOSIS — N1831 Chronic kidney disease, stage 3a: Secondary | ICD-10-CM | POA: Diagnosis not present

## 2022-07-27 DIAGNOSIS — C44319 Basal cell carcinoma of skin of other parts of face: Secondary | ICD-10-CM | POA: Diagnosis not present

## 2022-07-27 DIAGNOSIS — I130 Hypertensive heart and chronic kidney disease with heart failure and stage 1 through stage 4 chronic kidney disease, or unspecified chronic kidney disease: Secondary | ICD-10-CM | POA: Diagnosis not present

## 2022-07-27 DIAGNOSIS — N1831 Chronic kidney disease, stage 3a: Secondary | ICD-10-CM | POA: Diagnosis not present

## 2022-07-27 DIAGNOSIS — L570 Actinic keratosis: Secondary | ICD-10-CM | POA: Diagnosis not present

## 2022-07-27 DIAGNOSIS — G4733 Obstructive sleep apnea (adult) (pediatric): Secondary | ICD-10-CM | POA: Diagnosis not present

## 2022-07-27 DIAGNOSIS — I509 Heart failure, unspecified: Secondary | ICD-10-CM | POA: Diagnosis not present

## 2022-08-03 DIAGNOSIS — I130 Hypertensive heart and chronic kidney disease with heart failure and stage 1 through stage 4 chronic kidney disease, or unspecified chronic kidney disease: Secondary | ICD-10-CM | POA: Diagnosis not present

## 2022-08-03 DIAGNOSIS — G4733 Obstructive sleep apnea (adult) (pediatric): Secondary | ICD-10-CM | POA: Diagnosis not present

## 2022-08-03 DIAGNOSIS — I509 Heart failure, unspecified: Secondary | ICD-10-CM | POA: Diagnosis not present

## 2022-08-03 DIAGNOSIS — N1831 Chronic kidney disease, stage 3a: Secondary | ICD-10-CM | POA: Diagnosis not present

## 2022-08-08 DIAGNOSIS — N1831 Chronic kidney disease, stage 3a: Secondary | ICD-10-CM | POA: Diagnosis not present

## 2022-08-08 DIAGNOSIS — G4733 Obstructive sleep apnea (adult) (pediatric): Secondary | ICD-10-CM | POA: Diagnosis not present

## 2022-08-08 DIAGNOSIS — I130 Hypertensive heart and chronic kidney disease with heart failure and stage 1 through stage 4 chronic kidney disease, or unspecified chronic kidney disease: Secondary | ICD-10-CM | POA: Diagnosis not present

## 2022-08-08 DIAGNOSIS — I509 Heart failure, unspecified: Secondary | ICD-10-CM | POA: Diagnosis not present

## 2022-08-15 DIAGNOSIS — G4733 Obstructive sleep apnea (adult) (pediatric): Secondary | ICD-10-CM | POA: Diagnosis not present

## 2022-08-15 DIAGNOSIS — N1831 Chronic kidney disease, stage 3a: Secondary | ICD-10-CM | POA: Diagnosis not present

## 2022-08-15 DIAGNOSIS — I509 Heart failure, unspecified: Secondary | ICD-10-CM | POA: Diagnosis not present

## 2022-08-15 DIAGNOSIS — I130 Hypertensive heart and chronic kidney disease with heart failure and stage 1 through stage 4 chronic kidney disease, or unspecified chronic kidney disease: Secondary | ICD-10-CM | POA: Diagnosis not present

## 2022-08-22 DIAGNOSIS — C441122 Basal cell carcinoma of skin of right lower eyelid, including canthus: Secondary | ICD-10-CM | POA: Diagnosis not present

## 2022-08-23 DIAGNOSIS — I129 Hypertensive chronic kidney disease with stage 1 through stage 4 chronic kidney disease, or unspecified chronic kidney disease: Secondary | ICD-10-CM | POA: Diagnosis not present

## 2022-08-23 DIAGNOSIS — N1831 Chronic kidney disease, stage 3a: Secondary | ICD-10-CM | POA: Diagnosis not present

## 2022-08-23 DIAGNOSIS — C4491 Basal cell carcinoma of skin, unspecified: Secondary | ICD-10-CM | POA: Diagnosis not present

## 2022-08-28 DIAGNOSIS — G4733 Obstructive sleep apnea (adult) (pediatric): Secondary | ICD-10-CM | POA: Diagnosis not present

## 2022-08-29 DIAGNOSIS — I509 Heart failure, unspecified: Secondary | ICD-10-CM | POA: Diagnosis not present

## 2022-08-29 DIAGNOSIS — G4733 Obstructive sleep apnea (adult) (pediatric): Secondary | ICD-10-CM | POA: Diagnosis not present

## 2022-08-29 DIAGNOSIS — I130 Hypertensive heart and chronic kidney disease with heart failure and stage 1 through stage 4 chronic kidney disease, or unspecified chronic kidney disease: Secondary | ICD-10-CM | POA: Diagnosis not present

## 2022-08-29 DIAGNOSIS — N1831 Chronic kidney disease, stage 3a: Secondary | ICD-10-CM | POA: Diagnosis not present

## 2022-09-14 DIAGNOSIS — I129 Hypertensive chronic kidney disease with stage 1 through stage 4 chronic kidney disease, or unspecified chronic kidney disease: Secondary | ICD-10-CM | POA: Diagnosis not present

## 2022-09-14 DIAGNOSIS — N1831 Chronic kidney disease, stage 3a: Secondary | ICD-10-CM | POA: Diagnosis not present

## 2022-09-14 DIAGNOSIS — E782 Mixed hyperlipidemia: Secondary | ICD-10-CM | POA: Diagnosis not present

## 2022-09-14 DIAGNOSIS — I10 Essential (primary) hypertension: Secondary | ICD-10-CM | POA: Diagnosis not present

## 2022-09-28 DIAGNOSIS — Z23 Encounter for immunization: Secondary | ICD-10-CM | POA: Diagnosis not present

## 2022-09-28 DIAGNOSIS — Z0000 Encounter for general adult medical examination without abnormal findings: Secondary | ICD-10-CM | POA: Diagnosis not present

## 2022-09-28 DIAGNOSIS — R269 Unspecified abnormalities of gait and mobility: Secondary | ICD-10-CM | POA: Diagnosis not present

## 2022-09-28 DIAGNOSIS — N1831 Chronic kidney disease, stage 3a: Secondary | ICD-10-CM | POA: Diagnosis not present

## 2022-09-28 DIAGNOSIS — I739 Peripheral vascular disease, unspecified: Secondary | ICD-10-CM | POA: Diagnosis not present

## 2022-09-28 DIAGNOSIS — G6289 Other specified polyneuropathies: Secondary | ICD-10-CM | POA: Diagnosis not present

## 2022-09-28 DIAGNOSIS — Z9989 Dependence on other enabling machines and devices: Secondary | ICD-10-CM | POA: Diagnosis not present

## 2022-09-28 DIAGNOSIS — E782 Mixed hyperlipidemia: Secondary | ICD-10-CM | POA: Diagnosis not present

## 2022-09-28 DIAGNOSIS — I129 Hypertensive chronic kidney disease with stage 1 through stage 4 chronic kidney disease, or unspecified chronic kidney disease: Secondary | ICD-10-CM | POA: Diagnosis not present

## 2022-09-28 DIAGNOSIS — I10 Essential (primary) hypertension: Secondary | ICD-10-CM | POA: Diagnosis not present

## 2022-09-28 DIAGNOSIS — G4733 Obstructive sleep apnea (adult) (pediatric): Secondary | ICD-10-CM | POA: Diagnosis not present

## 2022-09-29 DIAGNOSIS — M7989 Other specified soft tissue disorders: Secondary | ICD-10-CM

## 2022-10-19 DIAGNOSIS — G4733 Obstructive sleep apnea (adult) (pediatric): Secondary | ICD-10-CM | POA: Diagnosis not present

## 2023-03-27 DIAGNOSIS — M79642 Pain in left hand: Secondary | ICD-10-CM | POA: Diagnosis not present

## 2023-03-27 DIAGNOSIS — M67834 Other specified disorders of tendon, left wrist: Secondary | ICD-10-CM | POA: Diagnosis not present

## 2023-04-09 ENCOUNTER — Inpatient Hospital Stay (HOSPITAL_COMMUNITY)
Admission: EM | Admit: 2023-04-09 | Discharge: 2023-04-11 | DRG: 171 | Disposition: A | Discharge status: Home / Self Care | Payer: Medicare Other | Attending: Internal Medicine | Admitting: Internal Medicine

## 2023-04-09 ENCOUNTER — Emergency Department (HOSPITAL_COMMUNITY): Payer: Medicare Other

## 2023-04-09 DIAGNOSIS — Z79899 Other long term (current) drug therapy: Secondary | ICD-10-CM

## 2023-04-09 DIAGNOSIS — Z880 Allergy status to penicillin: Secondary | ICD-10-CM | POA: Diagnosis not present

## 2023-04-09 DIAGNOSIS — I700 Atherosclerosis of aorta: Secondary | ICD-10-CM | POA: Diagnosis not present

## 2023-04-09 DIAGNOSIS — N179 Acute kidney failure, unspecified: Secondary | ICD-10-CM | POA: Diagnosis not present

## 2023-04-09 DIAGNOSIS — Z743 Need for continuous supervision: Secondary | ICD-10-CM | POA: Diagnosis not present

## 2023-04-09 DIAGNOSIS — M7989 Other specified soft tissue disorders: Secondary | ICD-10-CM | POA: Diagnosis present

## 2023-04-09 DIAGNOSIS — G629 Polyneuropathy, unspecified: Secondary | ICD-10-CM | POA: Diagnosis not present

## 2023-04-09 DIAGNOSIS — M21371 Foot drop, right foot: Secondary | ICD-10-CM | POA: Diagnosis not present

## 2023-04-09 DIAGNOSIS — Z8249 Family history of ischemic heart disease and other diseases of the circulatory system: Secondary | ICD-10-CM | POA: Diagnosis not present

## 2023-04-09 DIAGNOSIS — Z79891 Long term (current) use of opiate analgesic: Secondary | ICD-10-CM | POA: Diagnosis not present

## 2023-04-09 DIAGNOSIS — Z85828 Personal history of other malignant neoplasm of skin: Secondary | ICD-10-CM

## 2023-04-09 DIAGNOSIS — Z87891 Personal history of nicotine dependence: Secondary | ICD-10-CM | POA: Diagnosis not present

## 2023-04-09 DIAGNOSIS — R001 Bradycardia, unspecified: Secondary | ICD-10-CM | POA: Diagnosis present

## 2023-04-09 DIAGNOSIS — Z888 Allergy status to other drugs, medicaments and biological substances status: Secondary | ICD-10-CM

## 2023-04-09 DIAGNOSIS — E785 Hyperlipidemia, unspecified: Secondary | ICD-10-CM | POA: Diagnosis not present

## 2023-04-09 DIAGNOSIS — I493 Ventricular premature depolarization: Secondary | ICD-10-CM | POA: Diagnosis not present

## 2023-04-09 DIAGNOSIS — K219 Gastro-esophageal reflux disease without esophagitis: Secondary | ICD-10-CM | POA: Diagnosis present

## 2023-04-09 DIAGNOSIS — M21372 Foot drop, left foot: Secondary | ICD-10-CM | POA: Diagnosis present

## 2023-04-09 DIAGNOSIS — Z881 Allergy status to other antibiotic agents status: Secondary | ICD-10-CM

## 2023-04-09 DIAGNOSIS — R7989 Other specified abnormal findings of blood chemistry: Secondary | ICD-10-CM | POA: Diagnosis not present

## 2023-04-09 DIAGNOSIS — I499 Cardiac arrhythmia, unspecified: Secondary | ICD-10-CM | POA: Diagnosis not present

## 2023-04-09 DIAGNOSIS — I129 Hypertensive chronic kidney disease with stage 1 through stage 4 chronic kidney disease, or unspecified chronic kidney disease: Secondary | ICD-10-CM | POA: Diagnosis not present

## 2023-04-09 DIAGNOSIS — I442 Atrioventricular block, complete: Secondary | ICD-10-CM | POA: Diagnosis not present

## 2023-04-09 DIAGNOSIS — R053 Chronic cough: Secondary | ICD-10-CM | POA: Diagnosis not present

## 2023-04-09 DIAGNOSIS — G4733 Obstructive sleep apnea (adult) (pediatric): Secondary | ICD-10-CM | POA: Diagnosis present

## 2023-04-09 DIAGNOSIS — N183 Chronic kidney disease, stage 3 unspecified: Secondary | ICD-10-CM | POA: Diagnosis present

## 2023-04-09 DIAGNOSIS — R0602 Shortness of breath: Secondary | ICD-10-CM | POA: Diagnosis not present

## 2023-04-09 DIAGNOSIS — N400 Enlarged prostate without lower urinary tract symptoms: Secondary | ICD-10-CM | POA: Diagnosis present

## 2023-04-09 DIAGNOSIS — Z885 Allergy status to narcotic agent status: Secondary | ICD-10-CM

## 2023-04-09 DIAGNOSIS — R6889 Other general symptoms and signs: Secondary | ICD-10-CM | POA: Diagnosis not present

## 2023-04-09 DIAGNOSIS — I4430 Unspecified atrioventricular block: Secondary | ICD-10-CM | POA: Diagnosis not present

## 2023-04-09 DIAGNOSIS — J9811 Atelectasis: Secondary | ICD-10-CM | POA: Diagnosis not present

## 2023-04-10 ENCOUNTER — Encounter (HOSPITAL_COMMUNITY)
Admission: EM | Disposition: A | Discharge status: Home / Self Care | Payer: Self-pay | Source: Home / Self Care | Attending: Internal Medicine

## 2023-04-10 ENCOUNTER — Encounter (HOSPITAL_COMMUNITY): Payer: Self-pay | Admitting: Emergency Medicine

## 2023-04-10 ENCOUNTER — Other Ambulatory Visit: Payer: Self-pay

## 2023-04-10 ENCOUNTER — Inpatient Hospital Stay (HOSPITAL_COMMUNITY): Payer: Medicare Other

## 2023-04-10 DIAGNOSIS — M21372 Foot drop, left foot: Secondary | ICD-10-CM | POA: Diagnosis present

## 2023-04-10 DIAGNOSIS — I442 Atrioventricular block, complete: Secondary | ICD-10-CM

## 2023-04-10 DIAGNOSIS — Z85828 Personal history of other malignant neoplasm of skin: Secondary | ICD-10-CM | POA: Diagnosis not present

## 2023-04-10 DIAGNOSIS — G629 Polyneuropathy, unspecified: Secondary | ICD-10-CM | POA: Diagnosis present

## 2023-04-10 DIAGNOSIS — N179 Acute kidney failure, unspecified: Secondary | ICD-10-CM | POA: Diagnosis present

## 2023-04-10 DIAGNOSIS — Z885 Allergy status to narcotic agent status: Secondary | ICD-10-CM | POA: Diagnosis not present

## 2023-04-10 DIAGNOSIS — M21371 Foot drop, right foot: Secondary | ICD-10-CM | POA: Diagnosis present

## 2023-04-10 DIAGNOSIS — I129 Hypertensive chronic kidney disease with stage 1 through stage 4 chronic kidney disease, or unspecified chronic kidney disease: Secondary | ICD-10-CM | POA: Diagnosis present

## 2023-04-10 DIAGNOSIS — I700 Atherosclerosis of aorta: Secondary | ICD-10-CM | POA: Diagnosis not present

## 2023-04-10 DIAGNOSIS — R7989 Other specified abnormal findings of blood chemistry: Secondary | ICD-10-CM | POA: Diagnosis present

## 2023-04-10 DIAGNOSIS — Z87891 Personal history of nicotine dependence: Secondary | ICD-10-CM | POA: Diagnosis not present

## 2023-04-10 DIAGNOSIS — R001 Bradycardia, unspecified: Secondary | ICD-10-CM | POA: Diagnosis not present

## 2023-04-10 DIAGNOSIS — G4733 Obstructive sleep apnea (adult) (pediatric): Secondary | ICD-10-CM | POA: Diagnosis present

## 2023-04-10 DIAGNOSIS — Z8249 Family history of ischemic heart disease and other diseases of the circulatory system: Secondary | ICD-10-CM | POA: Diagnosis not present

## 2023-04-10 DIAGNOSIS — N400 Enlarged prostate without lower urinary tract symptoms: Secondary | ICD-10-CM | POA: Diagnosis present

## 2023-04-10 DIAGNOSIS — R0602 Shortness of breath: Secondary | ICD-10-CM | POA: Diagnosis not present

## 2023-04-10 DIAGNOSIS — I493 Ventricular premature depolarization: Secondary | ICD-10-CM | POA: Diagnosis present

## 2023-04-10 DIAGNOSIS — E785 Hyperlipidemia, unspecified: Secondary | ICD-10-CM | POA: Diagnosis present

## 2023-04-10 DIAGNOSIS — K219 Gastro-esophageal reflux disease without esophagitis: Secondary | ICD-10-CM | POA: Diagnosis present

## 2023-04-10 DIAGNOSIS — R053 Chronic cough: Secondary | ICD-10-CM | POA: Diagnosis present

## 2023-04-10 DIAGNOSIS — Z79891 Long term (current) use of opiate analgesic: Secondary | ICD-10-CM | POA: Diagnosis not present

## 2023-04-10 DIAGNOSIS — Z950 Presence of cardiac pacemaker: Secondary | ICD-10-CM | POA: Diagnosis not present

## 2023-04-10 DIAGNOSIS — Z881 Allergy status to other antibiotic agents status: Secondary | ICD-10-CM | POA: Diagnosis not present

## 2023-04-10 DIAGNOSIS — J90 Pleural effusion, not elsewhere classified: Secondary | ICD-10-CM | POA: Diagnosis not present

## 2023-04-10 DIAGNOSIS — Z79899 Other long term (current) drug therapy: Secondary | ICD-10-CM | POA: Diagnosis not present

## 2023-04-10 DIAGNOSIS — M7989 Other specified soft tissue disorders: Secondary | ICD-10-CM | POA: Diagnosis present

## 2023-04-10 DIAGNOSIS — N183 Chronic kidney disease, stage 3 unspecified: Secondary | ICD-10-CM | POA: Diagnosis present

## 2023-04-10 DIAGNOSIS — Z880 Allergy status to penicillin: Secondary | ICD-10-CM | POA: Diagnosis not present

## 2023-04-10 DIAGNOSIS — J9811 Atelectasis: Secondary | ICD-10-CM | POA: Diagnosis not present

## 2023-04-10 HISTORY — PX: PACEMAKER IMPLANT: EP1218

## 2023-04-10 LAB — I-STAT CHEM 8, ED
BUN: 53 mg/dL — ABNORMAL HIGH (ref 8–23)
Calcium, Ion: 1.15 mmol/L (ref 1.15–1.40)
Chloride: 107 mmol/L (ref 98–111)
Creatinine, Ser: 2.2 mg/dL — ABNORMAL HIGH (ref 0.61–1.24)
Glucose, Bld: 150 mg/dL — ABNORMAL HIGH (ref 70–99)
HCT: 41 % (ref 39.0–52.0)
Hemoglobin: 13.9 g/dL (ref 13.0–17.0)
Potassium: 4.5 mmol/L (ref 3.5–5.1)
Sodium: 140 mmol/L (ref 135–145)
TCO2: 23 mmol/L (ref 22–32)

## 2023-04-10 LAB — CBC
HCT: 40.7 % (ref 39.0–52.0)
HCT: 41.6 % (ref 39.0–52.0)
Hemoglobin: 13.3 g/dL (ref 13.0–17.0)
Hemoglobin: 13.5 g/dL (ref 13.0–17.0)
MCH: 29.4 pg (ref 26.0–34.0)
MCH: 29.4 pg (ref 26.0–34.0)
MCHC: 32.5 g/dL (ref 30.0–36.0)
MCHC: 32.7 g/dL (ref 30.0–36.0)
MCV: 90 fL (ref 80.0–100.0)
MCV: 90.6 fL (ref 80.0–100.0)
Platelets: 205 10*3/uL (ref 150–400)
Platelets: 219 10*3/uL (ref 150–400)
RBC: 4.52 MIL/uL (ref 4.22–5.81)
RBC: 4.59 MIL/uL (ref 4.22–5.81)
RDW: 13.5 % (ref 11.5–15.5)
RDW: 13.5 % (ref 11.5–15.5)
WBC: 10.8 10*3/uL — ABNORMAL HIGH (ref 4.0–10.5)
WBC: 12.1 10*3/uL — ABNORMAL HIGH (ref 4.0–10.5)
nRBC: 0 % (ref 0.0–0.2)
nRBC: 0 % (ref 0.0–0.2)

## 2023-04-10 LAB — COMPREHENSIVE METABOLIC PANEL
ALT: 32 U/L (ref 0–44)
AST: 26 U/L (ref 15–41)
Albumin: 3.4 g/dL — ABNORMAL LOW (ref 3.5–5.0)
Alkaline Phosphatase: 60 U/L (ref 38–126)
Anion gap: 7 (ref 5–15)
BUN: 49 mg/dL — ABNORMAL HIGH (ref 8–23)
CO2: 23 mmol/L (ref 22–32)
Calcium: 8.4 mg/dL — ABNORMAL LOW (ref 8.9–10.3)
Chloride: 107 mmol/L (ref 98–111)
Creatinine, Ser: 2.12 mg/dL — ABNORMAL HIGH (ref 0.61–1.24)
GFR, Estimated: 29 mL/min — ABNORMAL LOW (ref >60)
Glucose, Bld: 151 mg/dL — ABNORMAL HIGH (ref 70–99)
Potassium: 4.4 mmol/L (ref 3.5–5.1)
Sodium: 137 mmol/L (ref 135–145)
Total Bilirubin: 0.4 mg/dL (ref 0.0–1.2)
Total Protein: 6.2 g/dL — ABNORMAL LOW (ref 6.5–8.1)

## 2023-04-10 LAB — RESP PANEL BY RT-PCR (RSV, FLU A&B, COVID)  RVPGX2
Influenza A by PCR: NEGATIVE
Influenza B by PCR: NEGATIVE
Resp Syncytial Virus by PCR: NEGATIVE
SARS Coronavirus 2 by RT PCR: NEGATIVE

## 2023-04-10 LAB — ECHOCARDIOGRAM COMPLETE
Area-P 1/2: 2.68 cm2
Calc EF: 73 %
Height: 68 in
S' Lateral: 2.5 cm
Single Plane A2C EF: 76.3 %
Single Plane A4C EF: 72.5 %
Weight: 2880 [oz_av]

## 2023-04-10 LAB — BASIC METABOLIC PANEL
Anion gap: 10 (ref 5–15)
BUN: 50 mg/dL — ABNORMAL HIGH (ref 8–23)
CO2: 20 mmol/L — ABNORMAL LOW (ref 22–32)
Calcium: 8.4 mg/dL — ABNORMAL LOW (ref 8.9–10.3)
Chloride: 107 mmol/L (ref 98–111)
Creatinine, Ser: 2.05 mg/dL — ABNORMAL HIGH (ref 0.61–1.24)
GFR, Estimated: 30 mL/min — ABNORMAL LOW (ref >60)
Glucose, Bld: 146 mg/dL — ABNORMAL HIGH (ref 70–99)
Potassium: 4.3 mmol/L (ref 3.5–5.1)
Sodium: 137 mmol/L (ref 135–145)

## 2023-04-10 LAB — TSH: TSH: 2.315 u[IU]/mL (ref 0.350–4.500)

## 2023-04-10 LAB — BRAIN NATRIURETIC PEPTIDE: B Natriuretic Peptide: 753.4 pg/mL — ABNORMAL HIGH (ref 0.0–100.0)

## 2023-04-10 LAB — HEMOGLOBIN A1C
Hgb A1c MFr Bld: 6.2 % — ABNORMAL HIGH (ref 4.8–5.6)
Mean Plasma Glucose: 131.24 mg/dL

## 2023-04-10 LAB — TROPONIN I (HIGH SENSITIVITY)
Troponin I (High Sensitivity): 18 ng/L — ABNORMAL HIGH (ref <18)
Troponin I (High Sensitivity): 20 ng/L — ABNORMAL HIGH (ref <18)

## 2023-04-10 LAB — PROTIME-INR
INR: 1 (ref 0.8–1.2)
Prothrombin Time: 13.6 s (ref 11.4–15.2)

## 2023-04-10 LAB — CBG MONITORING, ED: Glucose-Capillary: 154 mg/dL — ABNORMAL HIGH (ref 70–99)

## 2023-04-10 LAB — APTT: aPTT: 32 s (ref 24–36)

## 2023-04-10 LAB — MAGNESIUM: Magnesium: 2.2 mg/dL (ref 1.7–2.4)

## 2023-04-10 SURGERY — PACEMAKER IMPLANT
Anesthesia: LOCAL

## 2023-04-10 MED ORDER — FLUTICASONE PROPIONATE 50 MCG/ACT NA SUSP
2.0000 | Freq: Every day | NASAL | Status: DC
  Filled 2023-04-10: qty 16

## 2023-04-10 MED ORDER — VANCOMYCIN HCL IN DEXTROSE 1-5 GM/200ML-% IV SOLN
1000.0000 mg | INTRAVENOUS | Status: DC
  Filled 2023-04-10: qty 200

## 2023-04-10 MED ORDER — CHLORHEXIDINE GLUCONATE 4 % EX SOLN
60.0000 mL | Freq: Once | CUTANEOUS | Status: DC
  Filled 2023-04-10: qty 60

## 2023-04-10 MED ORDER — TAMSULOSIN HCL 0.4 MG PO CAPS
0.4000 mg | ORAL_CAPSULE | Freq: Every day | ORAL | Status: DC
  Administered 2023-04-11: 0.4 mg via ORAL
  Filled 2023-04-10: qty 1

## 2023-04-10 MED ORDER — LORATADINE 10 MG PO TABS
10.0000 mg | ORAL_TABLET | Freq: Every day | ORAL | Status: DC
  Administered 2023-04-11: 10 mg via ORAL
  Filled 2023-04-10: qty 1

## 2023-04-10 MED ORDER — MIDAZOLAM HCL 2 MG/2ML IJ SOLN
INTRAMUSCULAR | Status: AC
  Filled 2023-04-10: qty 2

## 2023-04-10 MED ORDER — SODIUM CHLORIDE 0.9 % IV SOLN
80.0000 mg | INTRAVENOUS | Status: AC
  Administered 2023-04-10: 80 mg
  Filled 2023-04-10: qty 2

## 2023-04-10 MED ORDER — ONDANSETRON HCL 4 MG/2ML IJ SOLN: 4.0000 mg | Freq: Four times a day (QID) | INTRAMUSCULAR | Status: DC | PRN

## 2023-04-10 MED ORDER — SODIUM CHLORIDE 0.9 % IV SOLN
250.0000 mL | INTRAVENOUS | Status: DC
Start: 2023-04-10 — End: 2023-04-10

## 2023-04-10 MED ORDER — SENNOSIDES-DOCUSATE SODIUM 8.6-50 MG PO TABS: 2.0000 | ORAL_TABLET | Freq: Every day | ORAL | Status: DC | PRN

## 2023-04-10 MED ORDER — ENOXAPARIN SODIUM 30 MG/0.3ML IJ SOSY
30.0000 mg | PREFILLED_SYRINGE | INTRAMUSCULAR | Status: DC
  Administered 2023-04-11: 30 mg via SUBCUTANEOUS
  Filled 2023-04-10: qty 0.3

## 2023-04-10 MED ORDER — VANCOMYCIN HCL IN DEXTROSE 1-5 GM/200ML-% IV SOLN
1000.0000 mg | Freq: Two times a day (BID) | INTRAVENOUS | Status: AC
  Administered 2023-04-11: 1000 mg via INTRAVENOUS
  Filled 2023-04-10: qty 200

## 2023-04-10 MED ORDER — PANTOPRAZOLE SODIUM 40 MG PO TBEC
40.0000 mg | DELAYED_RELEASE_TABLET | Freq: Every day | ORAL | Status: DC
  Administered 2023-04-11: 40 mg via ORAL
  Filled 2023-04-10: qty 1

## 2023-04-10 MED ORDER — MIDAZOLAM HCL 5 MG/5ML IJ SOLN
INTRAMUSCULAR | Status: DC | PRN
  Administered 2023-04-10 (×2): 1 mg via INTRAVENOUS

## 2023-04-10 MED ORDER — TRAMADOL HCL 50 MG PO TABS
25.0000 mg | ORAL_TABLET | Freq: Two times a day (BID) | ORAL | Status: DC | PRN
  Administered 2023-04-10 – 2023-04-11 (×2): 25 mg via ORAL
  Filled 2023-04-10 (×2): qty 1

## 2023-04-10 MED ORDER — ACETAMINOPHEN 325 MG PO TABS
325.0000 mg | ORAL_TABLET | ORAL | Status: DC | PRN
Start: 2023-04-10 — End: 2023-04-11

## 2023-04-10 MED ORDER — SODIUM CHLORIDE 0.9 % IV SOLN: INTRAVENOUS | Status: DC

## 2023-04-10 MED ORDER — VANCOMYCIN HCL IN DEXTROSE 1-5 GM/200ML-% IV SOLN
INTRAVENOUS | Status: AC
  Filled 2023-04-10: qty 200

## 2023-04-10 MED ORDER — GABAPENTIN 300 MG PO CAPS
300.0000 mg | ORAL_CAPSULE | Freq: Every day | ORAL | Status: DC
Start: 2023-04-11 — End: 2023-04-11

## 2023-04-10 MED ORDER — FENTANYL CITRATE (PF) 100 MCG/2ML IJ SOLN
INTRAMUSCULAR | Status: AC
  Filled 2023-04-10: qty 2

## 2023-04-10 MED ORDER — INSULIN ASPART 100 UNIT/ML IJ SOLN: 0.0000 [IU] | Freq: Three times a day (TID) | INTRAMUSCULAR | Status: DC

## 2023-04-10 MED ORDER — FENTANYL CITRATE (PF) 100 MCG/2ML IJ SOLN
INTRAMUSCULAR | Status: DC | PRN
  Administered 2023-04-10 (×2): 12.5 ug via INTRAVENOUS

## 2023-04-10 MED ORDER — SODIUM CHLORIDE 0.9% FLUSH
3.0000 mL | Freq: Two times a day (BID) | INTRAVENOUS | Status: DC
  Administered 2023-04-10 – 2023-04-11 (×2): 3 mL via INTRAVENOUS

## 2023-04-10 MED ORDER — ACETAMINOPHEN 325 MG PO TABS: 650.0000 mg | ORAL_TABLET | Freq: Four times a day (QID) | ORAL | Status: DC | PRN

## 2023-04-10 MED ORDER — DOCUSATE SODIUM 100 MG PO CAPS
100.0000 mg | ORAL_CAPSULE | Freq: Two times a day (BID) | ORAL | Status: DC
Start: 2023-04-10 — End: 2023-04-11
  Administered 2023-04-10 – 2023-04-11 (×2): 100 mg via ORAL
  Filled 2023-04-10 (×2): qty 1

## 2023-04-10 MED ORDER — ACETAMINOPHEN 650 MG RE SUPP
650.0000 mg | Freq: Four times a day (QID) | RECTAL | Status: DC | PRN
Start: 2023-04-10 — End: 2023-04-11

## 2023-04-10 MED ORDER — LIDOCAINE HCL (PF) 1 % IJ SOLN
INTRAMUSCULAR | Status: AC
  Filled 2023-04-10: qty 60

## 2023-04-10 MED ORDER — SODIUM CHLORIDE 0.9% FLUSH: 3.0000 mL | INTRAVENOUS | Status: DC | PRN

## 2023-04-10 MED ORDER — LIDOCAINE HCL (PF) 1 % IJ SOLN
INTRAMUSCULAR | Status: DC | PRN
  Administered 2023-04-10: 60 mL

## 2023-04-10 MED ORDER — FUROSEMIDE 10 MG/ML IJ SOLN
40.0000 mg | Freq: Once | INTRAMUSCULAR | Status: AC
  Administered 2023-04-10: 40 mg via INTRAVENOUS
  Filled 2023-04-10: qty 4

## 2023-04-10 SURGICAL SUPPLY — 14 items
CABLE SURGICAL S-101-97-12 (CABLE) ×2 IMPLANT
CATH CPS LOCATOR 3D MED (CATHETERS) IMPLANT
HELIX LOCKING TOOL (MISCELLANEOUS) ×1 IMPLANT
KIT MICROPUNCTURE NIT STIFF (SHEATH) IMPLANT
LEAD ULTIPACE 52 LPA1231/52 (Lead) IMPLANT
LEAD ULTIPACE 65 LPA1231/65 (Lead) IMPLANT
PACEMAKER ASSURITY DR-RF (Pacemaker) IMPLANT
PAD DEFIB RADIO PHYSIO CONN (PAD) ×2 IMPLANT
SHEATH 7FR PRELUDE SNAP 13 (SHEATH) IMPLANT
SHEATH 9FR PRELUDE SNAP 13 (SHEATH) IMPLANT
SLITTER AGILIS HISPRO (INSTRUMENTS) IMPLANT
TOOL HELIX LOCKING (MISCELLANEOUS) IMPLANT
TRAY PACEMAKER INSERTION (PACKS) ×2 IMPLANT
WIRE HI TORQ VERSACORE-J 145CM (WIRE) IMPLANT

## 2023-04-10 NOTE — ED Provider Notes (Signed)
[place] EMERGENCY DEPARTMENT AT [facility] Provider Note   CSN: [id number] Arrival date & time: 04/09/23  2331     History  Chief complaint-shortness of breath  Level 5 caveat due to acuity of condition Timothy Khan is a 89 y.o. male.  The history is provided by the patient and the EMS personnel.  Patient with history of hypertension, chronic kidney disease presents with dyspnea on exertion.  Patient reports over the past day he has had increasing shortness of breath with exertion.  No chest pain.  No syncope.  No fevers, no vomiting or diarrhea.  No abdominal pain.  He reports chronic left leg swelling.  Reports has had mild posterior headache for several days likely due to his pillow.  No falls or head trauma. No new medications.  He has otherwise been at his baseline. Denies known history of CAD He does report he takes Eliquis    Past Medical History:  Diagnosis Date   Abnormal brain MRI    Right side   Basal cell carcinoma of back    BPH (benign prostatic hyperplasia)    Chronic cough    CKD (chronic kidney disease), stage III (HCC)    Concussion 1950   Baseball injury   Diastolic dysfunction    ED (erectile dysfunction)    Foot drop, bilateral 05/02/2013   Gait disorder    Gait disturbance    Gastroesophageal reflux disease    Hyperlipidemia    Hypertension    Libido, decreased    Lumbago    Memory loss    Onychomycosis    OSA (obstructive sleep apnea)    Peripheral neuropathy    PSA elevation    SDH (subdural hematoma) (HCC) 04/30/2013   Skull fracture (HCC)    Skull fracture (HCC)    Sleep apnea     Home Medications Prior to Admission medications   Medication Sig Start Date End Date Taking? Authorizing Provider  acetaminophen (TYLENOL) 500 MG tablet Take 1,000 mg by mouth every 6 (six) hours as needed for moderate pain.   Yes [provider]  amLODipine (NORVASC) 5 MG tablet Take 5 mg by mouth at bedtime. 06/24/18  Yes  [provider]  cetirizine (ZYRTEC) 10 MG tablet Take 10 mg by mouth at bedtime.    Yes [provider]  fluticasone (FLONASE) 50 MCG/ACT nasal spray Place 2 sprays into both nostrils at bedtime.  04/16/13  Yes [provider]  gabapentin (NEURONTIN) 300 MG capsule Take 2 capsules (600 mg total) by mouth at bedtime. 12/04/17  Yes York Spaniel, MD  tamsulosin (FLOMAX) 0.4 MG CAPS Take 0.4 mg by mouth daily.   Yes [provider]  traMADol (ULTRAM) 50 MG tablet Take 1-2 tablets (50-100 mg total) by mouth daily. Patient taking differently: Take 50 mg by mouth 2 (two) times daily. 06/06/21  Yes Almon Hercules, MD  Cholecalciferol (VITAMIN D3) 2000 UNITS capsule Take 2,000 Units by mouth 3 (three) times a week.    [provider]  Coenzyme Q10 (CO Q-10) 300 MG CAPS Take 300 mg by mouth daily.    [provider]  Cyanocobalamin (B-12) 5000 MCG CAPS Take 5,000 mcg by mouth once a week.    [provider]  diclofenac Sodium (VOLTAREN) 1 % GEL Apply 2 g topically 4 (four) times daily. Patient taking differently: Apply 2 g topically 4 (four) times daily as needed (pain). 06/06/21   Almon Hercules, MD  doxycycline (VIBRAMYCIN)  100 MG capsule Take 1 capsule (100 mg total) by mouth 2 (two) times daily. 01/15/22   Ellsworth Lennox, PA-C  esomeprazole (NEXIUM) 20 MG capsule Take 20 mg by mouth daily.     [provider]  fluocinonide (LIDEX) 0.05 % external solution Apply 1 application. topically 2 (two) times daily. 20 drops or 1 ml on the scalp 02/13/20   [provider]  mometasone (ELOCON) 0.1 % cream Apply 1 application. topically daily.    [provider]  ondansetron (ZOFRAN) 4 MG tablet Take 4 mg by mouth 3 (three) times daily as needed for nausea or vomiting. 01/05/20   [provider]  saccharomyces boulardii (FLORASTOR) 250 MG capsule Take 250 mg by mouth 2 (two) times daily.    [provider]   senna-docusate (SENOKOT-S) 8.6-50 MG tablet Take 2 tablets by mouth daily as needed for mild constipation.    [provider]  solifenacin (VESICARE) 5 MG tablet Take 5 mg by mouth every morning. 03/02/21   [provider]  triamcinolone lotion (KENALOG) 0.1 % Apply 1 application. topically daily. 02/04/21   [provider]      Allergies    Doxazosin mesylate, Lisinopril, Amoxicillin-pot clavulanate, Codeine, and Macrolides and ketolides    Review of Systems   Review of Systems  Unable to perform ROS: Acuity of condition  Constitutional:  Negative for fever.  Neurological:  Negative for speech difficulty.    Physical Exam Updated Vital Signs BP (!) 142/54   Pulse (!) 35   Temp (!) 97.4 F (36.3 C) (Oral)   Resp 18   Ht 1.727 m (5\' 8" )   Wt 81.6 kg   SpO2 93%   BMI 27.37 kg/m  Physical Exam CONSTITUTIONAL: Elderly, no acute distress HEAD: Normocephalic/atraumatic EYES: EOMI/PERRL ENMT: Mucous membranes moist NECK: supple no meningeal signs SPINE/BACK:entire spine nontender CV: Bradycardic LUNGS: Lungs are clear to auscultation bilaterally, no apparent distress ABDOMEN: soft, nontender NEURO: Pt is awake/alert/appropriate, moves all extremitiesx4.  No facial droop.  No arm or leg drift EXTREMITIES: pulses normal/equal, full ROM SKIN: warm, color normal  ED Results / Procedures / Treatments   Labs (all labs ordered are listed, but only abnormal results are displayed) Labs Reviewed  COMPREHENSIVE METABOLIC PANEL - Abnormal; Notable for the following components:      Result Value   Glucose, Bld 151 (*)    BUN 49 (*)    Creatinine, Ser 2.12 (*)    Calcium 8.4 (*)    Total Protein 6.2 (*)    Albumin 3.4 (*)    GFR, Estimated 29 (*)    All other components within normal limits  CBC - Abnormal; Notable for the following components:   WBC 10.8 (*)    All other components within normal limits  I-STAT CHEM 8, ED - Abnormal; Notable for the  following components:   BUN 53 (*)    Creatinine, Ser 2.20 (*)    Glucose, Bld 150 (*)    All other components within normal limits  TROPONIN I (HIGH SENSITIVITY) - Abnormal; Notable for the following components:   Troponin I (High Sensitivity) 18 (*)    All other components within normal limits  RESP PANEL BY RT-PCR (RSV, FLU A&B, COVID)  RVPGX2  PROTIME-INR  APTT  BRAIN NATRIURETIC PEPTIDE  TSH  CBC  CREATININE, SERUM  MAGNESIUM  BASIC METABOLIC PANEL  CBC  TROPONIN I (HIGH SENSITIVITY)    EKG EKG Interpretation Date/Time:  Tuesday April 10 2023  00:25:54 EST Ventricular Rate:  35 PR Interval:    QRS Duration:  149 QT Interval:  490 QTC Calculation: 374 R Axis:   89  Text Interpretation: Complete AV block with wide QRS complex Right bundle branch block Confirmed by Zadie Rhine ([id number]) on 04/10/2023 12:33:22 AM  Radiology DG Chest Port 1 View Result Date: 04/10/2023 CLINICAL DATA:  Shortness of breath and bradycardia EXAM: PORTABLE CHEST 1 VIEW COMPARISON:  08/30/2021 FINDINGS: Stable cardiomediastinal silhouette. Aortic atherosclerotic calcification. Left basilar atelectasis/scarring. Chronic interstitial coarsening. No pleural effusion or pneumothorax. No displaced rib fractures. IMPRESSION: Left basilar atelectasis.  No acute cardiopulmonary process. Electronically Signed   By: Minerva Fester M.D.   On: 04/10/2023 00:07    Procedures .Critical Care  Performed by: Zadie Rhine, MD Authorized by: Zadie Rhine, MD   Critical care provider statement:    Critical care time (minutes):  75   Critical care start time:  04/10/2023 12:00 AM   Critical care end time:  04/10/2023 1:15 AM   Critical care time was exclusive of:  Separately billable procedures and treating other patients   Critical care was necessary to treat or prevent imminent or life-threatening deterioration of the following conditions:  Cardiac failure, circulatory failure and respiratory  failure   Critical care was time spent personally by me on the following activities:  Examination of patient, discussions with consultants, re-evaluation of patient's condition, pulse oximetry, ordering and review of radiographic studies, ordering and review of laboratory studies, ordering and performing treatments and interventions, review of old charts, evaluation of patient's response to treatment, development of treatment plan with patient or surrogate and obtaining history from patient or surrogate   I assumed direction of critical care for this patient from another provider in my specialty: no     Care discussed with: admitting provider       Medications Ordered in ED Medications  traMADol (ULTRAM) tablet 25 mg (has no administration in time range)  pantoprazole (PROTONIX) EC tablet 40 mg (has no administration in time range)  senna-docusate (Senokot-S) tablet 2 tablet (has no administration in time range)  tamsulosin (FLOMAX) capsule 0.4 mg (has no administration in time range)  gabapentin (NEURONTIN) capsule 300 mg (has no administration in time range)  loratadine (CLARITIN) tablet 10 mg (has no administration in time range)  fluticasone (FLONASE) 50 MCG/ACT nasal spray 2 spray (has no administration in time range)  enoxaparin (LOVENOX) injection 30 mg (has no administration in time range)  acetaminophen (TYLENOL) tablet 650 mg (has no administration in time range)    Or  acetaminophen (TYLENOL) suppository 650 mg (has no administration in time range)  docusate sodium (COLACE) capsule 100 mg (has no administration in time range)    ED Course/ Medical Decision Making/ A&P Clinical Course as of 04/10/23 0125  Tue Apr 10, 2023  0009 Discussed the case with on-call cardiology.  Made aware this likely appears high-grade AV block Patient currently is an appropriate blood pressure.  Workup in progress [DW]  0014 Patient awake alert this time.  He is in no acute distress  pads have been  placed on the patient. Labs are pending at this time I do not have a current med list, but previous meds did not indicate a beta-blocker or calcium channel blocker [DW]  0032 Creatinine(!): 2.20 Acute kidney injury [DW]  0047 Patient feeling improved.  No acute distress.  Blood pressure is appropriate. Daughter is at bedside and updated med list.  He is not on anticoagulation  Patient reports his headache is improving [DW]  [phone] Discussed with cardiology, this is likely complete heart block, recommends medical admission [DW]  0123 Discussed with Dr. Geraldo Pitter with cardiology. After further evaluation, he will admit the patient Patient will likely require pacemaker  [DW]    Clinical Course User Index [DW] Zadie Rhine, MD                                 Medical Decision Making Amount and/or Complexity of Data Reviewed Labs: ordered. Decision-making details documented in ED Course. Radiology: ordered.  Risk Decision regarding hospitalization.   This patient presents to the ED for concern of shortness of breath, this involves an extensive number of treatment options, and is a complaint that carries with it a high risk of complications and morbidity.  The differential diagnosis includes but is not limited to Acute coronary syndrome, pneumonia, acute pulmonary edema, pneumothorax, acute anemia, pulmonary embolism, heart block    Comorbidities that complicate the patient evaluation: Patient's presentation is complicated by their history of hypertension  Social Determinants of Health: Patient's  poor memory   increases the complexity of managing their presentation  Additional history obtained: Additional history obtained from EMS  Records reviewed  outpatient records reviewed  Lab Tests: I Ordered, and personally interpreted labs.  The pertinent results include:  acute kidney injury  Imaging Studies ordered: I ordered imaging studies including X-ray chest   I independently  visualized and interpreted imaging which showed no acute findings I agree with the radiologist interpretation  Cardiac Monitoring: The patient was maintained on a cardiac monitor.  I personally viewed and interpreted the cardiac monitor which showed an underlying rhythm of:  Heart Block .  Critical Interventions:  admission to cardiology, likely pacemaker placement  Consultations Obtained: I requested consultation with the consultant cardiology , and discussed  findings as well as pertinent plan - they recommend: will admit  Reevaluation: After the interventions noted above, I reevaluated the patient and found that they have :stayed the same  Complexity of problems addressed: Patient's presentation is most consistent with  acute presentation with potential threat to life or bodily function  Disposition: After consideration of the diagnostic results and the patient's response to treatment,  I feel that the patent would benefit from admission   .           Final Clinical Impression(s) / ED Diagnoses Final diagnoses:  Complete heart block (HCC)  AKI (acute kidney injury) [facility])    Rx / DC Orders ED Discharge Orders     None         Zadie Rhine, MD 04/10/23 [phone]

## 2023-04-10 NOTE — ED Notes (Signed)
Son-in-law Heloise Beecham [phone]

## 2023-04-10 NOTE — Progress Notes (Signed)
Pt arrived from cath lab s/p pacemaker placement. AAOx4, VSS on RA. Pt denies any pain or discomfort at this time. Pt has generalized bruising and abrasions on his legs and incision on L chest. Pt educated on unit policies. Call bell within reach, bed in lowest position.

## 2023-04-10 NOTE — Consult Note (Addendum)
Cardiology Consultation   Patient ID: Timothy Khan MRN: [id number]; DOB: 05-Sep-1931  Admit date: 04/09/2023 Date of Consult: 04/10/2023  PCP:  Lorenda Ishihara, MD   [facility] Providers Cardiologist:  Dr. Anne Fu (Dec 2022)   Patient Profile:   Timothy Khan is a 89 y.o. male with a hx of HTN, OSA, CKD (III), quit smoking (1980s), hx of pancreatitis, {BPH, peripheral neuropathy who is being seen 04/10/2023 for the evaluation of CHB at the request of Dr. Geraldo Pitter.  History of Present Illness:   [name] sought attention with progressive and unusual fatigue, gait instability > found his HR low at home and came in Denied CP, SOB  Found in CHB Stable BPs and largely unremarkable labs  LABS K+ 4.4 >> 4.3 BUN/Creat 49/2.12 >> 2.05 (baseline 1.1-1.2) Mag 2.2 HS Trop 18 > 20 BNP 753 WBC 10.8 > 12.1 (afebrile), denies symptoms of illness H/H 13/40 Plts 205 TSH 2.315  Home meds reviewed No nodal blocking agents noted  He feels "fine" at rest, reports 2-3 days of very unsual DOE, progressively worse exertional intolerances, no CP, no palpitations No dizziness, near syncope or syncope.    Past Medical History:  Diagnosis Date   Abnormal brain MRI    Right side   Basal cell carcinoma of back    BPH (benign prostatic hyperplasia)    Chronic cough    CKD (chronic kidney disease), stage III (HCC)    Concussion 1950   Baseball injury   Diastolic dysfunction    ED (erectile dysfunction)    Foot drop, bilateral 05/02/2013   Gait disorder    Gait disturbance    Gastroesophageal reflux disease    Hyperlipidemia    Hypertension    Libido, decreased    Lumbago    Memory loss    Onychomycosis    OSA (obstructive sleep apnea)    Peripheral neuropathy    PSA elevation    SDH (subdural hematoma) (HCC) 04/30/2013   Skull fracture (HCC)    Skull fracture (HCC)    Sleep apnea     Past Surgical History:  Procedure Laterality Date    BIOPSY  03/01/2020   Procedure: BIOPSY;  Surgeon: Kerin Salen, MD;  Location: [facility];  Service: Gastroenterology;;   CATARACT EXTRACTION Bilateral    ENDOSCOPIC RETROGRADE CHOLANGIOPANCREATOGRAPHY (ERCP) WITH PROPOFOL N/A 07/05/2021   Procedure: ENDOSCOPIC RETROGRADE CHOLANGIOPANCREATOGRAPHY (ERCP) WITH PROPOFOL;  Surgeon: Kerin Salen, MD;  Location: [facility];  Service: Gastroenterology;  Laterality: N/A;   ESOPHAGEAL MANOMETRY N/A 01/05/2020   Procedure: ESOPHAGEAL MANOMETRY (EM);  Surgeon: Kerin Salen, MD;  Location: [facility];  Service: Gastroenterology;  Laterality: N/A;   ESOPHAGOGASTRODUODENOSCOPY (EGD) WITH PROPOFOL N/A 06/11/2018   Procedure: ESOPHAGOGASTRODUODENOSCOPY (EGD) WITH PROPOFOL;  Surgeon: Vida Rigger, MD;  Location: [facility];  Service: Endoscopy;  Laterality: N/A;   ESOPHAGOGASTRODUODENOSCOPY (EGD) WITH PROPOFOL N/A 03/01/2020   Procedure: ESOPHAGOGASTRODUODENOSCOPY (EGD) WITH PROPOFOL With Botox injections;  Surgeon: Kerin Salen, MD;  Location: [facility];  Service: Gastroenterology;  Laterality: N/A;   KNEE SURGERY Left    LAPAROSCOPIC CHOLECYSTECTOMY SINGLE SITE WITH INTRAOPERATIVE CHOLANGIOGRAM N/A 01/28/2021   Procedure: Laparscopic Cholecystectomy;  Drainage of empyema of gallbladder; Oversew of cystic duct with omentopexy;  Surgeon: Karie Soda, MD;  Location: [facility];  Service: General;  Laterality: N/A;   REMOVAL OF STONES  07/05/2021   Procedure: REMOVAL OF STONES;  Surgeon: Kerin Salen, MD;  Location: [facility];  Service: Gastroenterology;;   skin cancer resection  basal cell, [place] carcinoma   SPHINCTEROTOMY  07/05/2021   Procedure: SPHINCTEROTOMY;  Surgeon: Kerin Salen, MD;  Location: [facility];  Service: Gastroenterology;;   Sunnie Nielsen INJECTION  03/01/2020   Procedure: SUBMUCOSAL INJECTION;  Surgeon: Kerin Salen, MD;  Location: [facility];  Service: Gastroenterology;;   TONSILLECTOMY     VASECTOMY       Home Medications:  Prior to  Admission medications   Medication Sig Start Date End Date Taking? Authorizing Provider  acetaminophen (TYLENOL) 500 MG tablet Take 1,000 mg by mouth every 6 (six) hours as needed for moderate pain.   Yes [provider]  amLODipine (NORVASC) 5 MG tablet Take 5 mg by mouth at bedtime. 06/24/18  Yes [provider]  cetirizine (ZYRTEC) 10 MG tablet Take 10 mg by mouth at bedtime.    Yes [provider]  Cholecalciferol (VITAMIN D3) 2000 UNITS capsule Take 2,000 Units by mouth 3 (three) times a week.   Yes [provider]  Coenzyme Q10 (CO Q-10) 300 MG CAPS Take 300 mg by mouth daily.   Yes [provider]  Cyanocobalamin (B-12) 5000 MCG CAPS Take 5,000 mcg by mouth once a week.   Yes [provider]  diclofenac Sodium (VOLTAREN) 1 % GEL Apply 2 g topically 4 (four) times daily. Patient taking differently: Apply 2 g topically 4 (four) times daily as needed (pain). 06/06/21  Yes Almon Hercules, MD  esomeprazole (NEXIUM) 20 MG capsule Take 20 mg by mouth daily.    Yes [provider]  fluocinonide (LIDEX) 0.05 % external solution Apply 1 application  topically every evening. 20 drops or 1 ml on the scalp 02/13/20  Yes [provider]  fluticasone (FLONASE) 50 MCG/ACT nasal spray Place 2 sprays into both nostrils at bedtime.  04/16/13  Yes [provider]  gabapentin (NEURONTIN) 300 MG capsule Take 2 capsules (600 mg total) by mouth at bedtime. 12/04/17  Yes York Spaniel, MD  mometasone (ELOCON) 0.1 % cream Apply 1 application. topically daily.   Yes [provider]  ondansetron (ZOFRAN) 4 MG tablet Take 4 mg by mouth 3 (three) times daily as needed for nausea or vomiting. 01/05/20  Yes [provider]  saccharomyces boulardii (FLORASTOR) 250 MG capsule Take 250 mg by mouth 2 (two) times daily.   Yes [provider]  senna-docusate (SENOKOT-S) 8.6-50 MG tablet Take 2 tablets by mouth daily as needed  for mild constipation.   Yes [provider]  solifenacin (VESICARE) 5 MG tablet Take 5 mg by mouth every morning. 03/02/21  Yes [provider]  tamsulosin (FLOMAX) 0.4 MG CAPS Take 0.4 mg by mouth daily.   Yes [provider]  traMADol (ULTRAM) 50 MG tablet Take 1-2 tablets (50-100 mg total) by mouth daily. Patient taking differently: Take 100 mg by mouth every evening. 06/06/21  Yes Gonfa, Taye T, MD  triamcinolone lotion (KENALOG) 0.1 % Apply 1 application. topically daily. 02/04/21  Yes [provider]    Inpatient Medications: Scheduled Meds:  docusate sodium  100 mg Oral BID   [START ON 04/11/2023] enoxaparin (LOVENOX) injection  30 mg Subcutaneous Q24H   [START ON 04/11/2023] fluticasone  2 spray Each Nare QHS   [START ON 04/11/2023] gabapentin  300 mg Oral QHS   insulin aspart  0-15 Units Subcutaneous TID WC   [START ON 04/11/2023] loratadine  10 mg Oral Daily   pantoprazole  40 mg Oral Daily   [START ON 04/11/2023] tamsulosin  0.4 mg Oral Daily   Continuous Infusions:  PRN Meds: acetaminophen **OR** acetaminophen, senna-docusate, traMADol  Allergies:    Allergies  Allergen Reactions   Doxazosin Mesylate     fatigue   Lisinopril Cough   Amoxicillin-Pot Clavulanate Rash   Codeine Nausea Only   Macrolides And Ketolides Rash    Social History:   Social History   Socioeconomic History   Marital status: Married    Spouse name: Not on file   Number of children: 4   Years of education: 18   Highest education level: Not on file  Occupational History   Occupation: Retired  Tobacco Use   Smoking status: Former    Current packs/day: 0.00    Types: Cigarettes    Quit date: 08/18/1984    Years since quitting: 38.6    Passive exposure: Never   Smokeless tobacco: Never  Vaping Use   Vaping status: Never Used  Substance and Sexual Activity   Alcohol use: No   Drug use: No   Sexual activity: Not on file  Other Topics Concern   Not on  file  Social History Narrative   Patient states he is ambidextrous but is predominantly left handed.   Patient drinks 3 cups of coffee per day.   Social Drivers of Corporate investment banker Strain: Not on file  Food Insecurity: No Food Insecurity (11/25/2021)   Hunger Vital Sign    Worried About Running Out of Food in the Last Year: Never true    Ran Out of Food in the Last Year: Never true  Transportation Needs: No Transportation Needs (11/25/2021)   PRAPARE - Administrator, Civil Service (Medical): No    Lack of Transportation (Non-Medical): No  Physical Activity: Not on file  Stress: Not on file  Social Connections: Not on file  Intimate Partner Violence: Not on file    Family History:   Family History  Problem Relation Age of Onset   Cancer Mother        Breast cancer   Heart attack Father 65   CAD Father    Heart disease Brother    Lupus Daughter      ROS:  Please see the history of present illness.  All other ROS reviewed and negative.     Physical Exam/Data:   Vitals:   04/10/23 0515 04/10/23 0600 04/10/23 0645 04/10/23 0840  BP:   (!) 152/50   Pulse: (!) 43 (!) 45 (!) 44   Resp: 19 20 (!) 23   Temp:    98.7 F (37.1 C)  TempSrc:    Oral  SpO2: 95% 94% 94%   Weight:      Height:        Intake/Output Summary (Last 24 hours) at 04/10/2023 0854 Last data filed at 04/10/2023 0441 Gross per 24 hour  Intake 500 ml  Output 600 ml  Net -100 ml      04/10/2023   12:23 AM 03/23/2022    8:36 AM 08/15/2021    4:41 PM  Last 3 Weights  Weight (lbs) 180 lb 191 lb 14.4 oz 175 lb  Weight (kg) 81.647 kg 87.045 kg 79.379 kg     Body mass index is 27.37 kg/m.  General:  Well nourished, well developed, in no acute distress HEENT: normal Neck: no JVD Vascular: No carotid bruits; Distal pulses 2+ bilaterally Cardiac:  RRR, bradycardic; no murmurs, gallops or rubs Lungs:  CTA b/l, no wheezing, rhonchi or rales  Abd: soft, nontender, no hepatomegaly  Ext:  trace edema b/l (reported at baseline) Musculoskeletal:  No deformities Skin: warm and dry  Neuro:  no focal abnormalities noted Psych:  Normal affect   EKG:  The EKG was personally reviewed and demonstrates:    CHB 36bpm, RBBB CHB 35bpm, RBBB  OLD SR 84bpm, normal intervals   Telemetry:  Telemetry was personally reviewed and demonstrates:  CHB 30's  Relevant CV Studies:  03/2021 SPECT   The study is normal. The study is low risk.   No ST deviation was noted.   LV perfusion is normal. There is no evidence of ischemia. There is no evidence of infarction.   Left ventricular function is normal. Nuclear stress EF: 66 %. The left ventricular ejection fraction is hyperdynamic (>65%). End diastolic cavity size is normal.   Prior study not available for comparison.   Normal study without evidence of ischemia or infarction.  Normal LVEF, >65%.  This is a low-risk study.    Echo 02/2021 1. Left ventricular ejection fraction, by estimation, is 60 to 65%. The  left ventricle has normal function. The left ventricle has no regional  wall motion abnormalities. Left ventricular diastolic parameters are  consistent with Grade I diastolic  dysfunction (impaired relaxation).   2. Right ventricular systolic function is normal. The right ventricular  size is normal.   3. The mitral valve is normal in structure. No evidence of mitral valve  regurgitation. No evidence of mitral stenosis.   4. The aortic valve is tricuspid. There is mild thickening of the aortic  valve. Aortic valve regurgitation is not visualized. Aortic valve  sclerosis is present, with no evidence of aortic valve stenosis.   5. The inferior vena cava is normal in size with greater than 50%  respiratory variability, suggesting right atrial pressure of 3 mmHg.     Laboratory Data:  High Sensitivity Troponin:   Recent Labs  Lab 04/10/23 0010 04/10/23 0242  TROPONINIHS 18* 20*     Chemistry Recent Labs  Lab  04/10/23 0010 04/10/23 0025 04/10/23 0242  NA 137 140 137  K 4.4 4.5 4.3  CL 107 107 107  CO2 23  --  20*  GLUCOSE 151* 150* 146*  BUN 49* 53* 50*  CREATININE 2.12* 2.20* 2.05*  CALCIUM 8.4*  --  8.4*  MG  --   --  2.2  GFRNONAA 29*  --  30*  ANIONGAP 7  --  10    Recent Labs  Lab 04/10/23 0010  PROT 6.2*  ALBUMIN 3.4*  AST 26  ALT 32  ALKPHOS 60  BILITOT 0.4   Lipids No results for input(s): "CHOL", "TRIG", "HDL", "LABVLDL", "LDLCALC", "CHOLHDL" in the last 168 hours.  Hematology Recent Labs  Lab 04/10/23 0010 04/10/23 0025 04/10/23 0242  WBC 10.8*  --  12.1*  RBC 4.59  --  4.52  HGB 13.5 13.9 13.3  HCT 41.6 41.0 40.7  MCV 90.6  --  90.0  MCH 29.4  --  29.4  MCHC 32.5  --  32.7  RDW 13.5  --  13.5  PLT 219  --  205   Thyroid  Recent Labs  Lab 04/10/23 0010  TSH 2.315    BNP Recent Labs  Lab 04/10/23 0010  BNP 753.4*    DDimer No results for input(s): "DDIMER" in the last 168 hours.   Radiology/Studies:  [facility] Chest Port 1 View Result Date: 04/10/2023 CLINICAL DATA:  Shortness of breath and bradycardia EXAM:  PORTABLE CHEST 1 VIEW COMPARISON:  08/30/2021 FINDINGS: Stable cardiomediastinal silhouette. Aortic atherosclerotic calcification. Left basilar atelectasis/scarring. Chronic interstitial coarsening. No pleural effusion or pneumothorax. No displaced rib fractures. IMPRESSION: Left basilar atelectasis.  No acute cardiopulmonary process. Electronically Signed   By: Minerva Fester M.D.   On: 04/10/2023 00:07     Assessment and Plan:   CHB No reversible causes Will need PPM preserved LVEF by stress (Jan 2023) and echo (Dec 2022) RBBB escape   Discussed rational for PPM Implant procedure, potential risks/benefits He is agreeable  AKI Likely brady induced > should improve Labs tomorrow  HTN Home meds on hold Suspect resuming tomorrow  4. Mild WBC elevation No symptoms of illness Afebrile Likely reactive 2/2 brady  Risk  Assessment/Risk Scores:    For questions or updates, please contact [facility] Please consult [email or web address] for contact info under    Signed, Sheilah Pigeon, PA-C  04/10/2023 8:54 AM  EP Attending  Patient seen and examined. Agree with the findings as noted above. The patient presents with symptomatic CHB and a RBBB escape at 36/min. He has no reversible causes. I have discussed the indications/risks/benefits/goals/expectations of PPM insertion and he wishes to proceed.  Sharlot Gowda Denzil Mceachron,MD

## 2023-04-10 NOTE — Progress Notes (Signed)
  Echocardiogram 2D Echocardiogram has been performed.  Timothy Khan 04/10/2023, 11:10 AM

## 2023-04-10 NOTE — ED Triage Notes (Signed)
Patient brought in by EMS from home. Patient stated he was having shortness of breath for a few days and today his daughter used a pulse ox and his heart rate was low. Patient alert and oriented x4.

## 2023-04-10 NOTE — H&P (Signed)
Cardiology Admission History and Physical   Patient ID: Timothy Khan MRN: [id number]; DOB: 11/02/1931   Admission date: 04/09/2023  PCP:  Kirby Funk, MD (Inactive)   [facility] Providers Cardiologist:  None        Chief Complaint: Lightheadedness  Patient Profile:   Timothy Khan is a 89 y.o. male with hypertension, hyperlipidemia, OSA, CKD III, BPH, peripheral neuropathy, history of pancreatitis who is being seen 04/10/2023 for the evaluation of presyncope.  History of Present Illness:   Timothy Khan reports that he was at his baseline health status until about 2-3 days ago when he noticed uneasiness on his feet when ambulating.  He states that he suffers from peripheral neuropathy on a regular basis and has occasional difficulty with ambulation secondary to his symptoms which he manages with tramadol and gabapentin.  However, around 2-3 days ago, stated that he felt more uneasy on his feet and lightheaded.  Denies any syncopal events.  Also endorses new dyspnea on exertion.  No dyspnea at rest.  Denies any chest pain or discomfort during this time.  No recent tachypalpitations.  Has baseline lower extremity edema for which he wears compression stockings.  No recent change in lower extremity swelling.  No orthopnea or PND.  No recent infectious symptoms including fevers, chills, cough, congestion.  No recent nausea or vomiting.  He presents to the emergency room after he noted that his heart rate was abnormally low at the encouragement of his daughter.  His daughter was at bedside with her husband in the emergency room.  On arrival to the emergency room, afebrile, HR 30-50s, BP 130-150/50-60, not requiring supplemental oxygen.  CBC with WBC 10.8, Hgb 13.5, PLT 219.  CMP with Na 137, K 4.4, BUN 49, creatinine 2.12 (BUN/creatinine in 05/2021 - 18/1.02), INR 1.0, high-sensitivity troponin 18.  CXR without acute abnormalities including focal infiltrate, pleural  effusion, pneumothorax.  EKG with normal sinus rhythm and complete heart block with right bundle branch block ventricular escape in the 30s and sinus rate around 80 bpm.  He states that he is completely independent at home and helps take care of his 43 year old wife who is essentially bedbound.  States that he is ambidextrous but more commonly uses right arm.  Past Medical History:  Diagnosis Date   Abnormal brain MRI    Right side   Basal cell carcinoma of back    BPH (benign prostatic hyperplasia)    Chronic cough    CKD (chronic kidney disease), stage III (HCC)    Concussion 1950   Baseball injury   Diastolic dysfunction    ED (erectile dysfunction)    Foot drop, bilateral 05/02/2013   Gait disorder    Gait disturbance    Gastroesophageal reflux disease    Hyperlipidemia    Hypertension    Libido, decreased    Lumbago    Memory loss    Onychomycosis    OSA (obstructive sleep apnea)    Peripheral neuropathy    PSA elevation    SDH (subdural hematoma) (HCC) 04/30/2013   Skull fracture (HCC)    Skull fracture (HCC)    Sleep apnea     Past Surgical History:  Procedure Laterality Date   BIOPSY  03/01/2020   Procedure: BIOPSY;  Surgeon: Kerin Salen, MD;  Location: [facility];  Service: Gastroenterology;;   CATARACT EXTRACTION Bilateral    ENDOSCOPIC RETROGRADE CHOLANGIOPANCREATOGRAPHY (ERCP) WITH PROPOFOL N/A 07/05/2021   Procedure: ENDOSCOPIC RETROGRADE CHOLANGIOPANCREATOGRAPHY (ERCP) WITH PROPOFOL;  Surgeon: Kerin Salen, MD;  Location: [facility];  Service: Gastroenterology;  Laterality: N/A;   ESOPHAGEAL MANOMETRY N/A 01/05/2020   Procedure: ESOPHAGEAL MANOMETRY (EM);  Surgeon: Kerin Salen, MD;  Location: [facility];  Service: Gastroenterology;  Laterality: N/A;   ESOPHAGOGASTRODUODENOSCOPY (EGD) WITH PROPOFOL N/A 06/11/2018   Procedure: ESOPHAGOGASTRODUODENOSCOPY (EGD) WITH PROPOFOL;  Surgeon: Vida Rigger, MD;  Location: [facility];  Service: Endoscopy;  Laterality:  N/A;   ESOPHAGOGASTRODUODENOSCOPY (EGD) WITH PROPOFOL N/A 03/01/2020   Procedure: ESOPHAGOGASTRODUODENOSCOPY (EGD) WITH PROPOFOL With Botox injections;  Surgeon: Kerin Salen, MD;  Location: [facility];  Service: Gastroenterology;  Laterality: N/A;   KNEE SURGERY Left    LAPAROSCOPIC CHOLECYSTECTOMY SINGLE SITE WITH INTRAOPERATIVE CHOLANGIOGRAM N/A 01/28/2021   Procedure: Laparscopic Cholecystectomy;  Drainage of empyema of gallbladder; Oversew of cystic duct with omentopexy;  Surgeon: Karie Soda, MD;  Location: [facility];  Service: General;  Laterality: N/A;   REMOVAL OF STONES  07/05/2021   Procedure: REMOVAL OF STONES;  Surgeon: Kerin Salen, MD;  Location: [facility];  Service: Gastroenterology;;   skin cancer resection     basal cell, Pulcifer carcinoma   SPHINCTEROTOMY  07/05/2021   Procedure: SPHINCTEROTOMY;  Surgeon: Kerin Salen, MD;  Location: [facility];  Service: Gastroenterology;;   Sunnie Nielsen INJECTION  03/01/2020   Procedure: SUBMUCOSAL INJECTION;  Surgeon: Kerin Salen, MD;  Location: [facility];  Service: Gastroenterology;;   TONSILLECTOMY     VASECTOMY       Medications Prior to Admission: Prior to Admission medications   Medication Sig Start Date End Date Taking? Authorizing Provider  amLODipine (NORVASC) 5 MG tablet Take 5 mg by mouth at bedtime. 06/24/18  Yes [provider]  cetirizine (ZYRTEC) 10 MG tablet Take 10 mg by mouth at bedtime.    Yes [provider]  fluticasone (FLONASE) 50 MCG/ACT nasal spray Place 2 sprays into both nostrils at bedtime.  04/16/13  Yes [provider]  gabapentin (NEURONTIN) 300 MG capsule Take 2 capsules (600 mg total) by mouth at bedtime. 12/04/17  Yes York Spaniel, MD  tamsulosin (FLOMAX) 0.4 MG CAPS Take 0.4 mg by mouth daily.   Yes [provider]  traMADol (ULTRAM) 50 MG tablet Take 1-2 tablets (50-100 mg total) by mouth daily. Patient taking differently: Take 50 mg by mouth 2 (two) times daily.  06/06/21  Yes Almon Hercules, MD  acetaminophen (TYLENOL) 500 MG tablet Take 1,000 mg by mouth every 6 (six) hours as needed for moderate pain.    [provider]  cefadroxil (DURICEF) 500 MG capsule Take 1 capsule (500 mg total) by mouth 2 (two) times daily. 06/06/21   Almon Hercules, MD  Cholecalciferol (VITAMIN D3) 2000 UNITS capsule Take 2,000 Units by mouth 3 (three) times a week.    [provider]  Coenzyme Q10 (CO Q-10) 300 MG CAPS Take 300 mg by mouth daily.    [provider]  Cyanocobalamin (B-12) 5000 MCG CAPS Take 5,000 mcg by mouth once a week.    [provider]  diclofenac Sodium (VOLTAREN) 1 % GEL Apply 2 g topically 4 (four) times daily. Patient taking differently: Apply 2 g topically 4 (four) times daily as needed (pain). 06/06/21   Almon Hercules, MD  doxycycline (VIBRAMYCIN) 100 MG capsule Take 1 capsule (100 mg total) by mouth 2 (two) times daily. 01/15/22   Ellsworth Lennox, PA-C  esomeprazole (NEXIUM) 20 MG capsule Take 20 mg by mouth daily.     [provider]  fluocinonide (LIDEX) 0.05 % external solution Apply 1 application. topically 2 (two) times daily. 20 drops or 1 ml on the scalp 02/13/20   [provider]  mometasone (ELOCON) 0.1 % cream Apply 1 application. topically daily.    [provider]  ondansetron (ZOFRAN) 4 MG tablet Take 4 mg by mouth 3 (three) times daily as needed for nausea or vomiting. 01/05/20   [provider]  saccharomyces boulardii (FLORASTOR) 250 MG capsule Take 250 mg by mouth 2 (two) times daily.    [provider]  senna-docusate (SENOKOT-S) 8.6-50 MG tablet Take 2 tablets by mouth daily as needed for mild constipation.    [provider]  solifenacin (VESICARE) 5 MG tablet Take 5 mg by mouth every morning. 03/02/21   [provider]  triamcinolone lotion (KENALOG) 0.1 % Apply 1 application. topically daily. 02/04/21   [provider]      Allergies:    Allergies  Allergen Reactions   Doxazosin Mesylate     fatigue   Lisinopril Cough   Amoxicillin-Pot Clavulanate Rash   Codeine Nausea Only   Macrolides And Ketolides Rash    Social History:   Social History   Socioeconomic History   Marital status: Married    Spouse name: Not on file   Number of children: 4   Years of education: 18   Highest education level: Not on file  Occupational History   Occupation: Retired  Tobacco Use   Smoking status: Former    Current packs/day: 0.00    Types: Cigarettes    Quit date: 08/18/1984    Years since quitting: 38.6    Passive exposure: Never   Smokeless tobacco: Never  Vaping Use   Vaping status: Never Used  Substance and Sexual Activity   Alcohol use: No   Drug use: No   Sexual activity: Not on file  Other Topics Concern   Not on file  Social History Narrative   Patient states he is ambidextrous but is predominantly left handed.   Patient drinks 3 cups of coffee per day.   Social Drivers of Corporate investment banker Strain: Not on file  Food Insecurity: No Food Insecurity (11/25/2021)   Hunger Vital Sign    Worried About Running Out of Food in the Last Year: Never true    Ran Out of Food in the Last Year: Never true  Transportation Needs: No Transportation Needs (11/25/2021)   PRAPARE - Administrator, Civil Service (Medical): No    Lack of Transportation (Non-Medical): No  Physical Activity: Not on file  Stress: Not on file  Social Connections: Not on file  Intimate Partner Violence: Not on file    Family History:   The patient's family history includes CAD in his father; Cancer in his mother; Heart attack (age of onset: 15) in his father; Heart disease in his brother; Lupus in his daughter.    ROS:  Please see the history of present illness.  All other ROS reviewed and negative.     Physical Exam/Data:   Vitals:   04/10/23 0023 04/10/23 0030 04/10/23 0045 04/10/23 0100  BP:  (!)  135/58 (!) 140/61 (!) 142/54  Pulse:  (!) 35 (!) 35 (!) 35  Resp:  16 (!) 24 18  Temp:      TempSrc:      SpO2:  94% 94% 93%  Weight: 81.6 kg     Height: 5\' 8"  (1.727 m)  Intake/Output Summary (Last 24 hours) at 04/10/2023 0114 Last data filed at 04/10/2023 0019 Gross per 24 hour  Intake 500 ml  Output --  Net 500 ml      04/10/2023   12:23 AM 03/23/2022    8:36 AM 08/15/2021    4:41 PM  Last 3 Weights  Weight (lbs) 180 lb 191 lb 14.4 oz 175 lb  Weight (kg) 81.647 kg 87.045 kg 79.379 kg     Body mass index is 27.37 kg/m.  General:  Well nourished, well developed, in no acute distress HEENT: normal Neck: no JVD Vascular: No carotid bruits; Distal pulses 2+ bilaterally   Cardiac: Bradycardic and regular; RRR; no murmur  Lungs:  clear to auscultation bilaterally, no wheezing, rhonchi or rales  Abd: soft, nontender, no hepatomegaly  Ext: 1+ pretibial edema Musculoskeletal:  No deformities, BUE and BLE strength normal and equal Skin: warm and dry  Neuro:  CNs 2-12 intact, no focal abnormalities noted Psych:  Normal affect    EKG:  The ECG that was done 04/10/2023 was personally reviewed and demonstrates sinus rhythm with complete heart block and ventricular escape with right bundle branch block morphology  Relevant CV Studies: 03/2021 SPECT   The study is normal. The study is low risk.   No ST deviation was noted.   LV perfusion is normal. There is no evidence of ischemia. There is no evidence of infarction.   Left ventricular function is normal. Nuclear stress EF: 66 %. The left ventricular ejection fraction is hyperdynamic (>65%). End diastolic cavity size is normal.   Prior study not available for comparison.   Normal study without evidence of ischemia or infarction.  Normal LVEF, >65%.  This is a low-risk study.   Echo 02/2021 1. Left ventricular ejection fraction, by estimation, is 60 to 65%. The  left ventricle has normal function. The left ventricle has no  regional  wall motion abnormalities. Left ventricular diastolic parameters are  consistent with Grade I diastolic  dysfunction (impaired relaxation).   2. Right ventricular systolic function is normal. The right ventricular  size is normal.   3. The mitral valve is normal in structure. No evidence of mitral valve  regurgitation. No evidence of mitral stenosis.   4. The aortic valve is tricuspid. There is mild thickening of the aortic  valve. Aortic valve regurgitation is not visualized. Aortic valve  sclerosis is present, with no evidence of aortic valve stenosis.   5. The inferior vena cava is normal in size with greater than 50%  respiratory variability, suggesting right atrial pressure of 3 mmHg.    Laboratory Data:  High Sensitivity Troponin:   Recent Labs  Lab 04/10/23 0010  TROPONINIHS 18*      Chemistry Recent Labs  Lab 04/10/23 0010 04/10/23 0025  NA 137 140  K 4.4 4.5  CL 107 107  CO2 23  --   GLUCOSE 151* 150*  BUN 49* 53*  CREATININE 2.12* 2.20*  CALCIUM 8.4*  --   GFRNONAA 29*  --   ANIONGAP 7  --     Recent Labs  Lab 04/10/23 0010  PROT 6.2*  ALBUMIN 3.4*  AST 26  ALT 32  ALKPHOS 60  BILITOT 0.4   Lipids No results for input(s): "CHOL", "TRIG", "HDL", "LABVLDL", "LDLCALC", "CHOLHDL" in the last 168 hours. Hematology Recent Labs  Lab 04/10/23 0010 04/10/23 0025  WBC 10.8*  --   RBC 4.59  --   HGB 13.5 13.9  HCT 41.6 41.0  MCV 90.6  --   MCH 29.4  --   MCHC 32.5  --   RDW 13.5  --   PLT 219  --    Thyroid No results for input(s): "TSH", "FREET4" in the last 168 hours. BNPNo results for input(s): "BNP", "PROBNP" in the last 168 hours.  DDimer No results for input(s): "DDIMER" in the last 168 hours.   Radiology/Studies:  DG Chest Port 1 View Result Date: 04/10/2023 CLINICAL DATA:  Shortness of breath and bradycardia EXAM: PORTABLE CHEST 1 VIEW COMPARISON:  08/30/2021 FINDINGS: Stable cardiomediastinal silhouette. Aortic atherosclerotic  calcification. Left basilar atelectasis/scarring. Chronic interstitial coarsening. No pleural effusion or pneumothorax. No displaced rib fractures. IMPRESSION: Left basilar atelectasis.  No acute cardiopulmonary process. Electronically Signed   By: Minerva Fester M.D.   On: 04/10/2023 00:07     Assessment and Plan:   Complete heart block, acute Presenting with 2-3 days of presyncope with dyspnea on exertion with EKG evidence of sinus rhythm with complete heart block and ventricular escape in right bundle branch block morphology.  Suspect related to age-related degeneration/conduction disease.  He is hemodynamically stable at this time.  Will allow for permissive hypertension.  No recent chest discomfort or symptoms concerning for ACS.  Had nuclear SPECT stress test negative for ischemia in 03/2021. - obtain TSH, BNP - remain on telemetry - Keep K>4 and Mag>2 - Echo - NPO for possible PPM in AM - trend troponin x3 total  2.  AKI on CKD Suspect intravascular volume overload.  BNP elevated. - trial of IV diuresis  3.  Peripheral neuropathy - dose reduce tramadol and gabapentin for AKI  4.  Seasonal allergies - flonase and claritin  5.  GERD - PPI  6.  BPH - home tamsulosin  Risk Assessment/Risk Scores:          Code Status: Full Code  Severity of Illness: The appropriate patient status for this patient is INPATIENT. Inpatient status is judged to be reasonable and necessary in order to provide the required intensity of service to ensure the patient's safety. The patient's presenting symptoms, physical exam findings, and initial radiographic and laboratory data in the context of their chronic comorbidities is felt to place them at high risk for further clinical deterioration. Furthermore, it is not anticipated that the patient will be medically stable for discharge from the hospital within 2 midnights of admission.   * I certify that at the point of admission it is my clinical  judgment that the patient will require inpatient hospital care spanning beyond 2 midnights from the point of admission due to high intensity of service, high risk for further deterioration and high frequency of surveillance required.*   For questions or updates, please contact [facility] Please consult [email or web address] for contact info under     Signed, Aundra Dubin, MD  04/10/2023 1:14 AM

## 2023-04-10 NOTE — ED Notes (Signed)
Patient leaving the floor in stable condition, AOX4, with his belongings, family and staff.

## 2023-04-10 NOTE — ED Notes (Signed)
 Patient is awake and alert this morning, no s/s of distress, will continue to monitor.

## 2023-04-10 NOTE — ED Notes (Signed)
 Zoll pads placed

## 2023-04-11 ENCOUNTER — Encounter (HOSPITAL_COMMUNITY): Payer: Self-pay | Admitting: Internal Medicine

## 2023-04-11 ENCOUNTER — Telehealth: Payer: Self-pay

## 2023-04-11 ENCOUNTER — Other Ambulatory Visit: Payer: Self-pay

## 2023-04-11 ENCOUNTER — Inpatient Hospital Stay (HOSPITAL_COMMUNITY): Payer: Medicare Other

## 2023-04-11 DIAGNOSIS — I442 Atrioventricular block, complete: Secondary | ICD-10-CM | POA: Diagnosis not present

## 2023-04-11 LAB — GLUCOSE, CAPILLARY: Glucose-Capillary: 110 mg/dL — ABNORMAL HIGH (ref 70–99)

## 2023-04-11 NOTE — Telephone Encounter (Signed)
Follow-up after same day discharge: Implant date: 04/10/2023 MD: Lewayne Bunting, MD Device: ppm Location: l chest   Wound check visit: yes 90 day MD follow-up: yes  Remote Transmission received:not yet, pt had just got home from hospital and has not plugged in the monitor   Dressing/sling removed: not yet will do this evening   Confirm OAC restart on: yes  Please continue to monitor your cardiac device site for redness, swelling, and drainage. Call the device clinic at [phone] if you experience these symptoms, fever/chills, or have questions about your device.   Remote monitoring is used to monitor your cardiac device from home. This monitoring is scheduled every 91 days by our office. It allows [name] to keep an eye on the functioning of your device to ensure it is working properly.  Will call pt back if no transmission has been sent

## 2023-04-11 NOTE — TOC Transition Note (Signed)
Transition of Care [facility] - Discharge Note   Patient Details  Name: Timothy Khan MRN: [id number] Date of Birth: 1932-03-08  Transition of Care [facility]) CM/SW Contact:  Leone Haven, RN Phone Number: 04/11/2023, 8:33 AM   Clinical Narrative:    For possible dc today, has transport.   Final next level of care: Home/Self Care Barriers to Discharge: Continued Medical Work up   Patient Goals and CMS Choice Patient states their goals for this hospitalization and ongoing recovery are:: return home   Choice offered to / list presented to : NA      Discharge Placement                       Discharge Plan and Services Additional resources added to the After Visit Summary for   In-house Referral: NA Discharge Planning Services: CM Consult Post Acute Care Choice: NA            DME Agency: NA       HH Arranged: NA          Social Drivers of Health (SDOH) Interventions SDOH Screenings   Food Insecurity: No Food Insecurity (04/11/2023)  Housing: Low Risk  (04/11/2023)  Transportation Needs: No Transportation Needs (04/11/2023)  Utilities: Not At Risk (04/11/2023)  Social Connections: Moderately Integrated (04/11/2023)  Tobacco Use: Medium Risk (04/10/2023)     Readmission Risk Interventions     No data to display

## 2023-04-11 NOTE — TOC Initial Note (Signed)
Transition of Care [facility]) - Initial/Assessment Note    Patient Details  Name: Timothy Khan MRN: [id number] Date of Birth: 11/11/1931  Transition [facility]) CM/SW Contact:    Leone Haven, RN Phone Number: 04/11/2023, 8:30 AM  Clinical Narrative:                 From home with spouse, has PCP , [facility] and insurance on file, states has two private duty Nurses  in place , has cane , walker and a w/chair at home.  States daughter will transport them home at [employer] and family is support system, states gets medications from [facility] on [address]  Sandria Bales self ambulatory with a cane  Expected Discharge Plan: Home/Self Care Barriers to Discharge: Continued Medical Work up   Patient Goals and CMS Choice Patient states their goals for this hospitalization and ongoing recovery are:: return home   Choice offered to / list presented to : NA      Expected Discharge Plan and Services In-house Referral: NA Discharge Planning Services: CM Consult Post Acute Care Choice: NA Living arrangements for the past 2 months: Single Family Home                   DME Agency: NA       HH Arranged: NA          Prior Living Arrangements/Services Living arrangements for the past 2 months: Single Family Home Lives with:: Spouse Patient language and need for interpreter reviewed:: Yes Do you feel safe going back to the place where you live?: Yes      Need for Family Participation in Patient Care: Yes (Comment) Care giver support system in place?: Yes (comment) Current home services: DME (walker, cane,w/chair) Criminal Activity/Legal Involvement Pertinent to Current Situation/Hospitalization: No - Comment as needed  Activities of Daily Living   ADL Screening (condition at time of admission) Independently performs ADLs?: Yes (appropriate for developmental age) Is the patient deaf or have difficulty hearing?: No Does the patient have difficulty seeing, even  when wearing glasses/contacts?: No Does the patient have difficulty concentrating, remembering, or making decisions?: No  Permission Sought/Granted Permission sought to share information with : Case Manager Permission granted to share information with : Yes, Verbal Permission Granted  Share Information with NAME: daughter, Zadie Cleverly           Emotional Assessment Appearance:: Appears stated age Attitude/Demeanor/Rapport: Engaged Affect (typically observed): Appropriate Orientation: : Oriented to Self, Oriented to Place, Oriented to  Time, Oriented to Situation Alcohol / Substance Use: Not Applicable Psych Involvement: No (comment)  Admission diagnosis:  Complete heart block (HCC) [I44.2] AKI (acute kidney injury) (HCC) [N17.9] Patient Active Problem List   Diagnosis Date Noted   Complete heart block (HCC) 04/10/2023   Generalized weakness 06/06/2021   Urinary tract infection 06/05/2021   Fever 06/04/2021   Chronic diastolic CHF (congestive heart failure) (HCC) 06/03/2021   Hyperbilirubinemia 06/03/2021   Elevated alkaline phosphatase level 06/03/2021   Pancreatitis 06/02/2021   Transaminitis    Elevated LFTs -cannot rule out retained/de novo biliary stone 03/12/2021   Sepsis with acute organ dysfunction without septic shock (HCC) 03/12/2021   Acute renal failure superimposed on stage 3a chronic kidney disease (HCC) 03/01/2021   Former smoker 03/01/2021   Family history of early CAD 03/01/2021   OSA (obstructive sleep apnea) 07/25/2020   Acute pancreatitis 06/06/2018   BPH (benign prostatic hyperplasia) 06/06/2018   LBBB (left bundle branch  block) 06/06/2018   GERD (gastroesophageal reflux disease) 06/06/2018   Hiatal hernia 06/06/2018   Foot drop, bilateral 05/02/2013   Polyneuropathy 04/30/2013   Hereditary and idiopathic peripheral neuropathy 03/18/2012   Abnormality of gait 03/18/2012   Hypertension 03/18/2012   PCP:  Lorenda Ishihara, MD Pharmacy:    [facility] [id number] - [address], [place] - [address] [place] [address] Phone: [phone] Fax: [phone]  [id number] - [place], [place] - [address] AT [facility] [address] [place] [address] Phone: [phone] Fax: [phone]     Social Drivers of Health (SDOH) Social History: SDOH Screenings   Food Insecurity: No Food Insecurity (04/11/2023)  Housing: Low Risk  (04/11/2023)  Transportation Needs: No Transportation Needs (04/11/2023)  Utilities: Not At Risk (04/11/2023)  Social Connections: Moderately Integrated (04/11/2023)  Tobacco Use: Medium Risk (04/10/2023)   SDOH Interventions:     Readmission Risk Interventions     No data to display

## 2023-04-11 NOTE — Discharge Summary (Addendum)
ELECTROPHYSIOLOGY PROCEDURE DISCHARGE SUMMARY    Patient ID: Timothy Khan,  MRN: [id number], DOB/AGE: Apr 21, 1931 89 y.o.  Admit date: 04/09/2023 Discharge date: 04/11/2023  Primary Care Physician: Lorenda Ishihara, MD  Primary Cardiologist: None  Electrophysiologist: Dr. Ladona Ridgel   Primary Discharge Diagnosis:  CHB status post pacemaker implantation this admission  Secondary Discharge Diagnosis:  AKI HTN  Allergies  Allergen Reactions   Doxazosin Mesylate     fatigue   Lisinopril Cough   Amoxicillin-Pot Clavulanate Rash   Codeine Nausea Only   Macrolides And Ketolides Rash     Procedures This Admission:  1.  Implantation of a Abbott Dual Chamber PPM on 04/10/2023 by Dr. Ladona Ridgel. The patient received a Abbott Assurity [id number] with a Abbott Ultipace 1231-52 right atrial lead and a Abbott Ultipace 1231-65 right ventricular lead.  There were no immediate post procedure complications.   2.  CXR on 04/11/2023 demonstrated no pneumothorax status post device implantation.       Brief HPI: Timothy SHOULTS is a 89 y.o. male was admitted for fatigue, gait instability, found to be in CHB and electrophysiology team asked to see for consideration of PPM implantation.  Past medical history includes HTN, OSA, CKD - III.  The patient has had AV block without reversible causes identified.  Risks, benefits, and alternatives to PPM implantation were reviewed with the patient who wished to proceed.   Hospital Course:  The patient was admitted and underwent implantation of a Abbott dual chamber PPM with details as outlined above.  He was monitored on telemetry overnight which demonstrated appropriate pacing.  Left chest was without hematoma or ecchymosis.  The device was interrogated and found to be functioning normally.  CXR was obtained and demonstrated no pneumothorax status post device implantation.  Wound care, arm mobility, and restrictions were reviewed with the  patient.  The patient was examined and considered stable for discharge to home.    Anticoagulation resumption This patient is not on anticoagulation        Physical Exam: Vitals:   04/10/23 1929 04/11/23 0000 04/11/23 0400 04/11/23 0740  BP: (!) 148/73 (!) 156/74 (!) 157/77 (!) 155/70  Pulse: 76 96 83 70  Resp: 18 18 18 18   Temp: 97.8 F (36.6 C)  99.1 F (37.3 C) 98.2 F (36.8 C)  TempSrc: Oral  Oral Oral  SpO2: 94% 96% 94% 96%  Weight:   79.7 kg   Height:        GEN- NAD. A&O x 3.  HEENT: Normocephalic, atraumatic Lungs- CTAB, Normal effort.  Heart- RRR, No M/G/R.  GI- Soft, NT, ND.  Extremities- No clubbing, cyanosis, or edema;  Skin- warm and dry, no rash or lesion, left chest without hematoma/ecchymosis  Discharge Medications:  Allergies as of 04/11/2023       Reactions   Doxazosin Mesylate    fatigue   Lisinopril Cough   Amoxicillin-pot Clavulanate Rash   Codeine Nausea Only   Macrolides And Ketolides Rash        Medication List     TAKE these medications    acetaminophen 500 MG tablet Commonly known as: TYLENOL Take 1,000 mg by mouth every 6 (six) hours as needed for moderate pain.   amLODipine 5 MG tablet Commonly known as: NORVASC Take 5 mg by mouth at bedtime.   B-12 5000 MCG Caps Take 5,000 mcg by mouth once a week.   cetirizine 10 MG tablet Commonly known as: ZYRTEC Take 10 mg by mouth  at bedtime.   Co Q-10 300 MG Caps Take 300 mg by mouth daily.   diclofenac Sodium 1 % Gel Commonly known as: VOLTAREN Apply 2 g topically 4 (four) times daily. What changed:  when to take this reasons to take this   esomeprazole 20 MG capsule Commonly known as: NEXIUM Take 20 mg by mouth daily.   fluocinonide 0.05 % external solution Commonly known as: LIDEX Apply 1 application  topically every evening. 20 drops or 1 ml on the scalp   fluticasone 50 MCG/ACT nasal spray Commonly known as: FLONASE Place 2 sprays into both nostrils at  bedtime.   gabapentin 300 MG capsule Commonly known as: NEURONTIN Take 2 capsules (600 mg total) by mouth at bedtime.   mometasone 0.1 % cream Commonly known as: ELOCON Apply 1 application. topically daily.   ondansetron 4 MG tablet Commonly known as: ZOFRAN Take 4 mg by mouth 3 (three) times daily as needed for nausea or vomiting.   saccharomyces boulardii 250 MG capsule Commonly known as: FLORASTOR Take 250 mg by mouth 2 (two) times daily.   senna-docusate 8.6-50 MG tablet Commonly known as: Senokot-S Take 2 tablets by mouth daily as needed for mild constipation.   solifenacin 5 MG tablet Commonly known as: VESICARE Take 5 mg by mouth every morning.   tamsulosin 0.4 MG Caps capsule Commonly known as: FLOMAX Take 0.4 mg by mouth daily.   traMADol 50 MG tablet Commonly known as: ULTRAM Take 1-2 tablets (50-100 mg total) by mouth daily. What changed:  how much to take when to take this   triamcinolone lotion 0.1 % Commonly known as: KENALOG Apply 1 application. topically daily.   Vitamin D3 50 MCG (2000 UT) capsule Take 2,000 Units by mouth 3 (three) times a week.        Disposition:  Home with usual follow up as in AVS   Duration of Discharge Encounter:  APP time: 25 minutes  Signed, Sherie Don, NP  04/11/2023 9:25 AM  EP Attending  Patient seen and examined. Agree with above. The patient is stable after DDD PM insertion. He has undergone PPM interrogation under my direction with normal DDD PM function. His CXR looks good. He can be discharged home with the usual followup.  Sharlot Gowda Makailyn Mccormick,MD

## 2023-04-11 NOTE — Discharge Instructions (Signed)
After Your Pacemaker   You have a Abbott Pacemaker  ACTIVITY Do not lift your arm above shoulder height for 1 week after your procedure. After 7 days, you may progress as below.  You should remove your sling 24 hours after your procedure, unless otherwise instructed by your provider.     Wednesday April 18, 2023  Thursday April 19, 2023 Friday April 20, 2023 Saturday April 21, 2023   Do not lift, push, pull, or carry anything over 10 pounds with the affected arm until 6 weeks (Wednesday May 23, 2023 ) after your procedure.   You may drive AFTER your wound check, unless you have been told otherwise by your provider.   Ask your healthcare provider when you can go back to work   INCISION/Dressing If you are on a blood thinner such as Coumadin, Xarelto, Eliquis, Plavix, or Pradaxa please confirm with your provider when this should be resumed.   If large square, outer bandage is left in place, this can be removed after 24 hours from your procedure. Do not remove steri-strips or glue as below.   If a PRESSURE DRESSING (a bulky dressing that usually goes up over your shoulder) was applied or left in place, please follow instructions given by your provider on when to return to have this removed.   Monitor your Pacemaker site for redness, swelling, and drainage. Call the device clinic at [phone] if you experience these symptoms or fever/chills.  If your incision is sealed with Steri-strips or staples, you may shower 7 days after your procedure or when told by your provider. Do not remove the steri-strips or let the shower hit directly on your site. You may wash around your site with soap and water.    If you were discharged in a sling, please do not wear this during the day more than 48 hours after your surgery unless otherwise instructed. This may increase the risk of stiffness and soreness in your shoulder.   Avoid lotions, ointments, or perfumes over your incision until it is  well-healed.  You may use a hot tub or a pool AFTER your wound check appointment if the incision is completely closed.  Pacemaker Alerts:  Some alerts are vibratory and others beep. These are NOT emergencies. Please call our office to let us know. If this occurs at night or on weekends, it can wait until the next business day. Send a remote transmission.  If your device is capable of reading fluid status (for heart failure), you will be offered monthly monitoring to review this with you.   DEVICE MANAGEMENT Remote monitoring is used to monitor your pacemaker from home. This monitoring is scheduled every 91 days by our office. It allows [name] to keep an eye on the functioning of your device to ensure it is working properly. You will routinely see your Electrophysiologist annually (more often if necessary).   You should receive your ID card for your new device in 4-8 weeks. Keep this card with you at all times once received. Consider wearing a medical alert bracelet or necklace.  Your Pacemaker may be MRI compatible. This will be discussed at your next office visit/wound check.  You should avoid contact with strong electric or magnetic fields.   Do not use amateur (ham) radio equipment or electric (arc) welding torches. MP3 player headphones with magnets should not be used. Some devices are safe to use if held at least 12 inches (30 cm) from your Pacemaker. These include power tools, lawn  mowers, and speakers. If you are unsure if something is safe to use, ask your health care provider.  When using your cell phone, hold it to the ear that is on the opposite side from the Pacemaker. Do not leave your cell phone in a pocket over the Pacemaker.  You may safely use electric blankets, heating pads, computers, and microwave ovens.  Call the office right away if: You have chest pain. You feel more short of breath than you have felt before. You feel more light-headed than you have felt before. Your  incision starts to open up.  This information is not intended to replace advice given to you by your health care provider. Make sure you discuss any questions you have with your health care provider.

## 2023-04-12 MED FILL — Midazolam HCl Inj 2 MG/2ML (Base Equivalent): INTRAMUSCULAR | Qty: 2 | Status: AC

## 2023-04-16 NOTE — Telephone Encounter (Signed)
Transmission received 04/12/2023.

## 2023-04-18 ENCOUNTER — Telehealth: Payer: Self-pay | Admitting: Internal Medicine

## 2023-04-18 NOTE — Telephone Encounter (Signed)
Pt c/o medication issue:  1. Name of Medication:   amLODipine (NORVASC) 5 MG tablet   2. How are you currently taking this medication (dosage and times per day)?   As prescribed  3. Are you having a reaction (difficulty breathing--STAT)?   4. What is your medication issue?   Daughter Lawson Fiscal) stated patient's PCP told the patient to stop taking this medication until his appointment on Monday, 2/3.  Daughter noted patient was prescribed Carvedilol but had not filled that prescription.

## 2023-04-19 ENCOUNTER — Encounter: Payer: Self-pay | Admitting: Internal Medicine

## 2023-04-19 NOTE — Telephone Encounter (Signed)
See phone note

## 2023-04-19 NOTE — Telephone Encounter (Signed)
Daughter Lawson Fiscal) called again to follow-up on next steps with patient's BP medication.

## 2023-04-19 NOTE — Telephone Encounter (Signed)
Called Patient back about message from his daughter. Patient stated it was okay to talk to daughter Lawson Fiscal. Patient stated he will be seeing his PCP on Monday. Patient stated back on 02/21/23 PCP stop Amlodipine and started Coreg. Patient never got a new prescription, so changes were never made. PCP told patient now that he can just stop amlodipine until they see him. Daughter does not feel comfortable about stopping amlodipine without consulting Dr. Ladona Ridgel. Informed daughter that she should see why medication was being stopped and if he does stop it to check BP to make sure it is doing okay. Patient's daughter stated she would call PCP again. Reminded them of wound check next week.

## 2023-04-23 DIAGNOSIS — I129 Hypertensive chronic kidney disease with stage 1 through stage 4 chronic kidney disease, or unspecified chronic kidney disease: Secondary | ICD-10-CM | POA: Diagnosis not present

## 2023-04-23 DIAGNOSIS — G6289 Other specified polyneuropathies: Secondary | ICD-10-CM | POA: Diagnosis not present

## 2023-04-23 DIAGNOSIS — N1831 Chronic kidney disease, stage 3a: Secondary | ICD-10-CM | POA: Diagnosis not present

## 2023-04-23 DIAGNOSIS — Z950 Presence of cardiac pacemaker: Secondary | ICD-10-CM | POA: Diagnosis not present

## 2023-04-23 DIAGNOSIS — I10 Essential (primary) hypertension: Secondary | ICD-10-CM | POA: Diagnosis not present

## 2023-04-25 ENCOUNTER — Ambulatory Visit: Payer: Medicare Other | Attending: Internal Medicine

## 2023-04-25 DIAGNOSIS — I442 Atrioventricular block, complete: Secondary | ICD-10-CM

## 2023-04-25 LAB — CUP PACEART INCLINIC DEVICE CHECK
Date Time Interrogation Session: 20250205090352
Implantable Lead Connection Status: 753985
Implantable Lead Connection Status: 753985
Implantable Lead Implant Date: 20250121
Implantable Lead Implant Date: 20250121
Implantable Lead Location: 753859
Implantable Lead Location: 753860
Implantable Pulse Generator Implant Date: 20250121
Pulse Gen Model: 2272
Pulse Gen Serial Number: 8233318

## 2023-04-25 NOTE — Progress Notes (Signed)
 Normal Pacemaker wound check. Wound well healed. Thresholds, sensing, and impedances consistent with implant measurements and at 3.5V safety margin/auto capture until 3 month visit. AT/AF burden <1%, longest AF event logged 6 minutes, no OAC on file. Reviewed arm restrictions to continue for 6 weeks total post op.  Pt enrolled in remote follow-up.

## 2023-04-25 NOTE — Patient Instructions (Signed)

## 2023-04-25 NOTE — Telephone Encounter (Signed)
Ok to hold amlodipine

## 2023-05-01 DIAGNOSIS — L812 Freckles: Secondary | ICD-10-CM | POA: Diagnosis not present

## 2023-05-01 DIAGNOSIS — L570 Actinic keratosis: Secondary | ICD-10-CM | POA: Diagnosis not present

## 2023-05-01 DIAGNOSIS — L308 Other specified dermatitis: Secondary | ICD-10-CM | POA: Diagnosis not present

## 2023-05-01 DIAGNOSIS — Z85828 Personal history of other malignant neoplasm of skin: Secondary | ICD-10-CM | POA: Diagnosis not present

## 2023-05-01 DIAGNOSIS — L820 Inflamed seborrheic keratosis: Secondary | ICD-10-CM | POA: Diagnosis not present

## 2023-05-01 DIAGNOSIS — L821 Other seborrheic keratosis: Secondary | ICD-10-CM | POA: Diagnosis not present

## 2023-05-01 DIAGNOSIS — I872 Venous insufficiency (chronic) (peripheral): Secondary | ICD-10-CM | POA: Diagnosis not present

## 2023-05-03 NOTE — Telephone Encounter (Signed)
Addressed by Antonietta Breach

## 2023-06-10 IMAGING — CT CT ABD-PELV W/ CM
2 of 5 series · 16 of 46 positions shown, 18 images · IV contrast (OMNIPAQUE 350)
Comparison: CT abdomen pelvis dated July 25, 2020.

CLINICAL DATA: Epigastric pain.  History of pancreatitis.

EXAM:
CT ABDOMEN AND PELVIS WITH CONTRAST
TECHNIQUE: Multidetector CT imaging of the abdomen and pelvis was performed
using the standard protocol following bolus administration of
intravenous contrast.
CONTRAST:  80mL OMNIPAQUE IOHEXOL 350 MG/ML SOLN

[Series 2: axial st · axial · 0.85mm/px · z∈[+1030,+1435]mm · 13 of 95 slices shown, 15 images]
[im 7/95  soft-tissue]
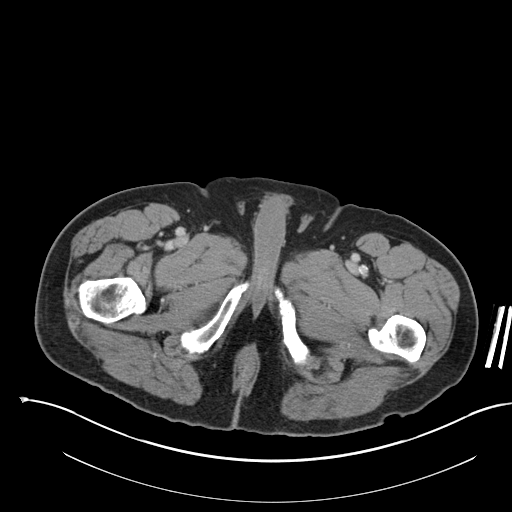
[im 7/95  bone]
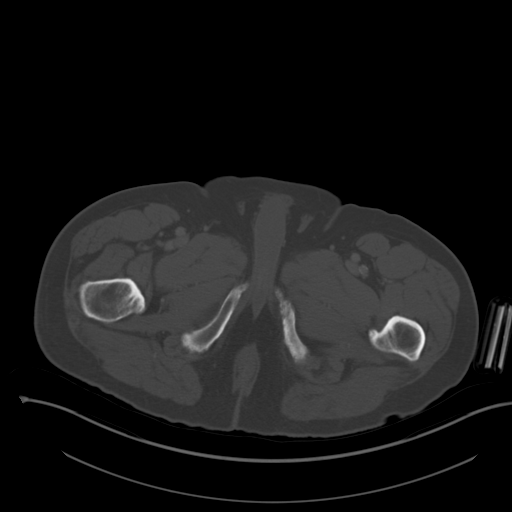
[im 13/95  soft-tissue]
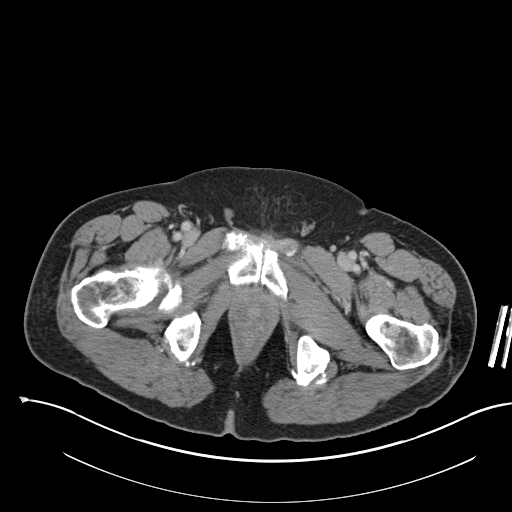
[im 19/95  soft-tissue]
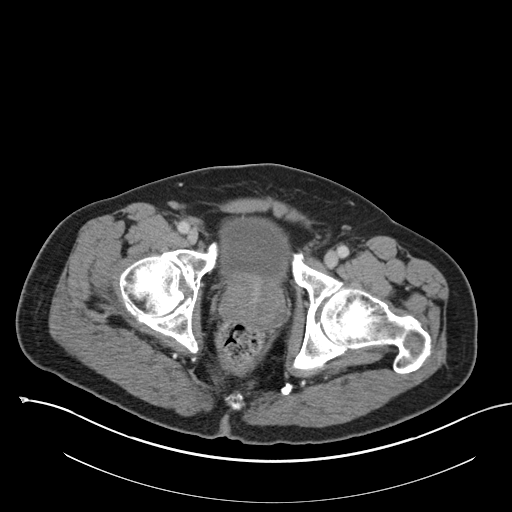
[im 26/95  soft-tissue]
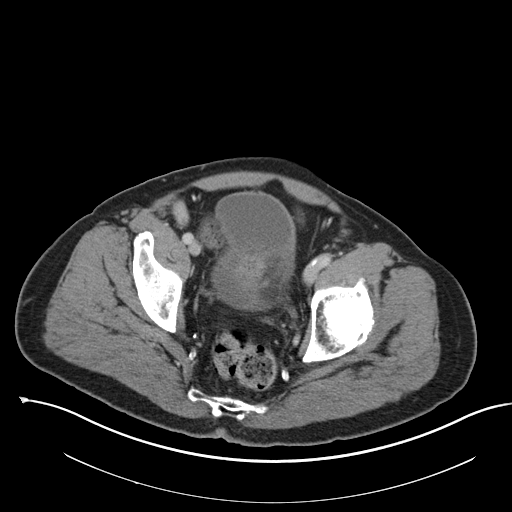
[im 32/95  soft-tissue]
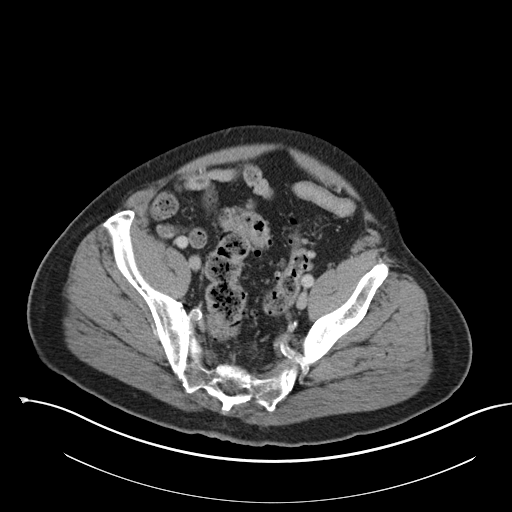
[im 38/95  soft-tissue]
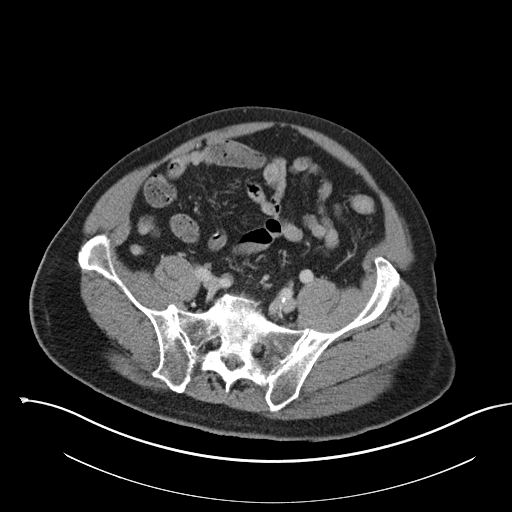
[im 51/95  soft-tissue]
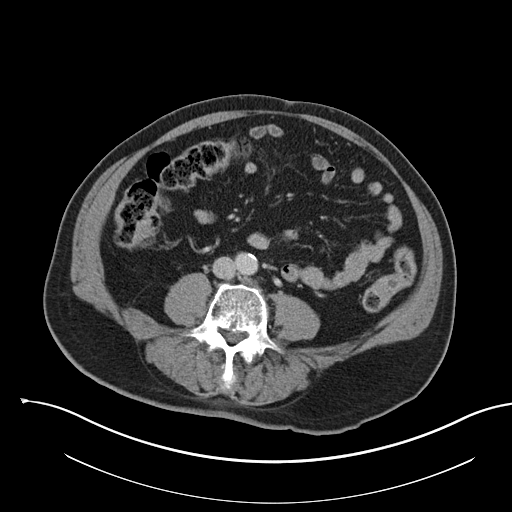
[im 57/95  soft-tissue]
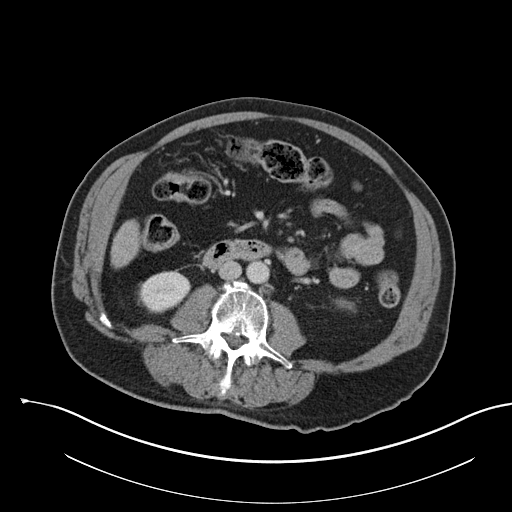
[im 63/95  soft-tissue]
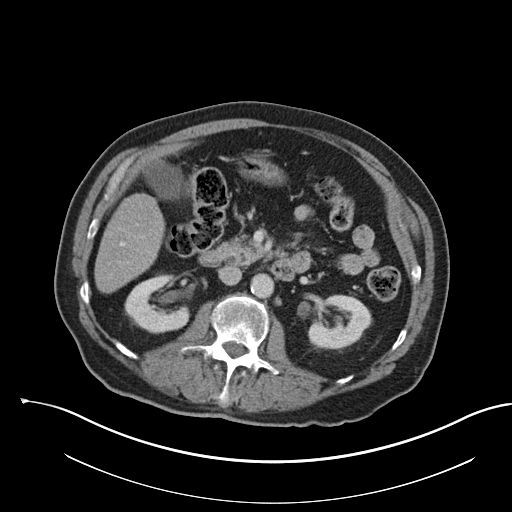
[im 63/95  bone]
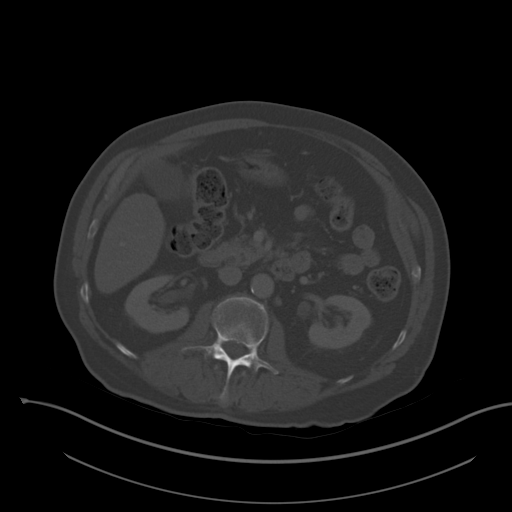
[im 69/95  soft-tissue]
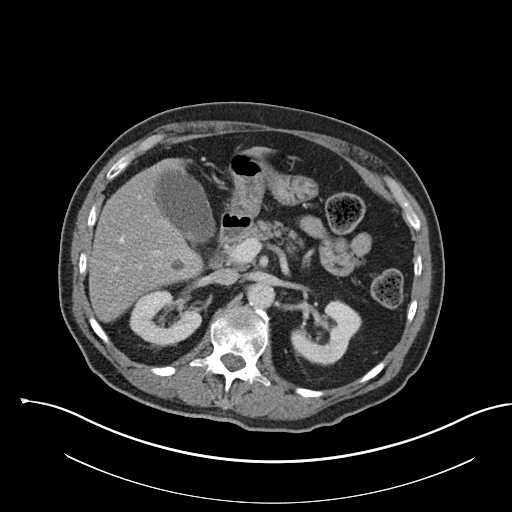
[im 76/95  soft-tissue]
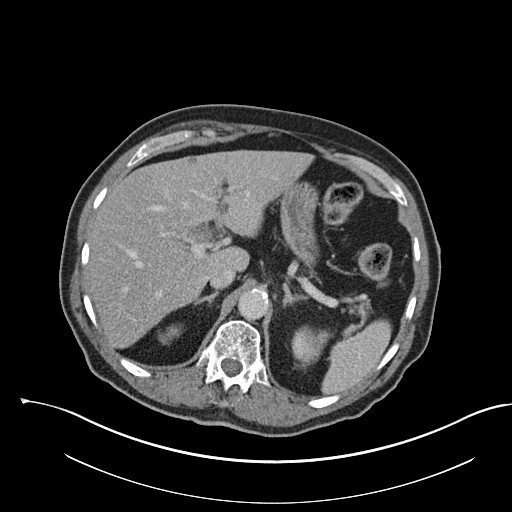
[im 82/95  soft-tissue]
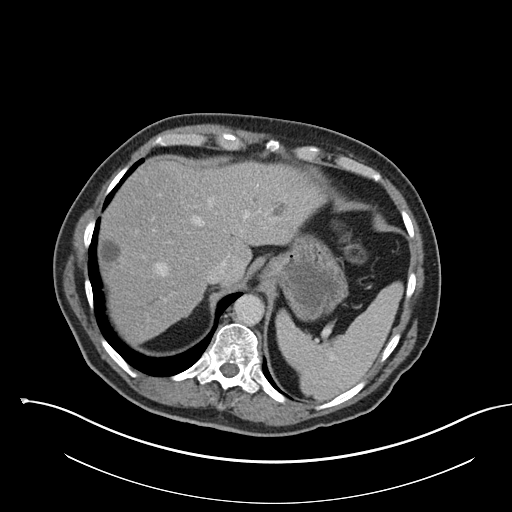
[im 88/95  soft-tissue]
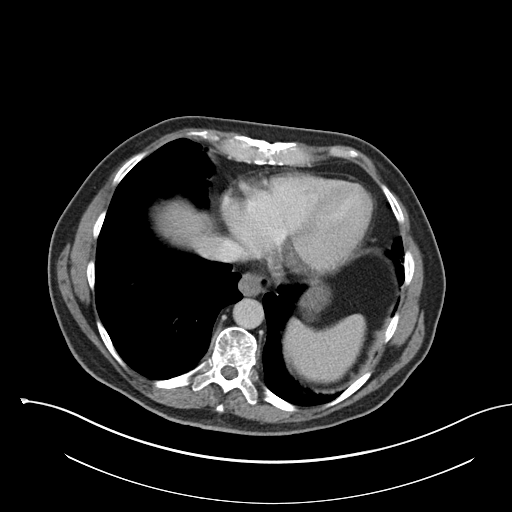

[Series 5: coronal st · coronal · 0.73mm/px · 3 of 149 slices shown]
[im 50/149  soft-tissue]
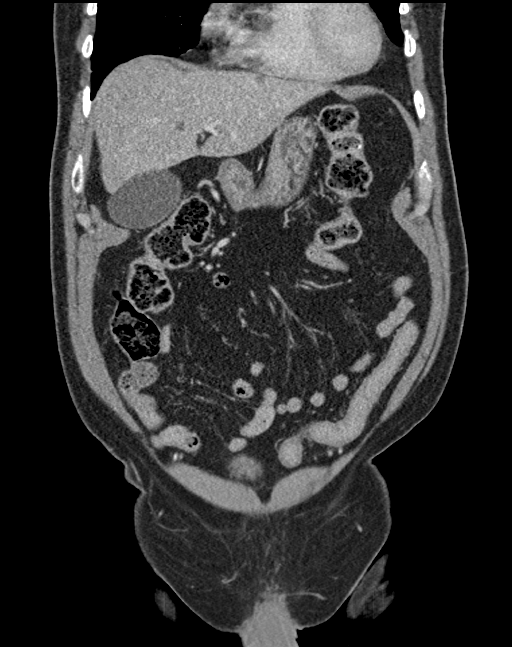
[im 66/149  soft-tissue]
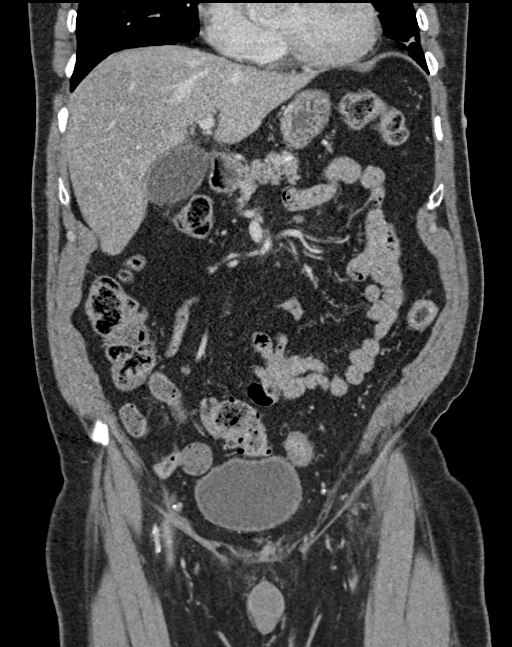
[im 83/149  soft-tissue]
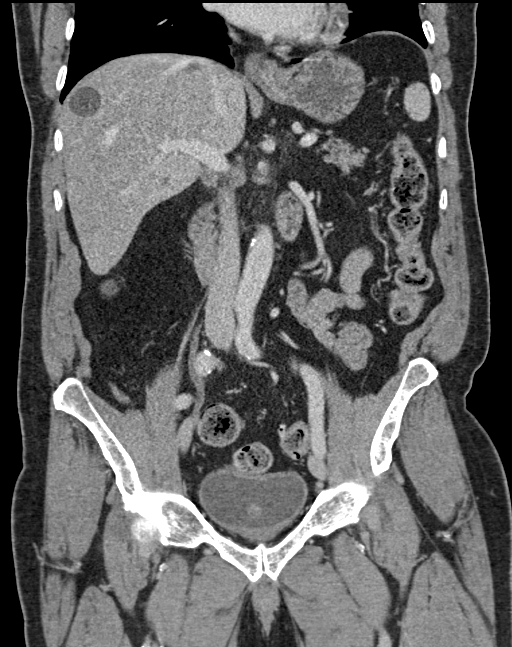

[16 of 46 positions shown; findings below may reference images not displayed]

FINDINGS: Lower chest: No acute abnormality. Unchanged mild bibasilar
scarring.

Hepatobiliary: Multiple simple cysts and subcentimeter low-density
lesions within the liver are unchanged. Small gallstones again
noted. No gallbladder wall thickening or biliary dilatation.

Pancreas: Unremarkable. No pancreatic ductal dilatation or
surrounding inflammatory changes.

Spleen: Normal in size without focal abnormality.

Adrenals/Urinary Tract: Adrenal glands are unremarkable. Kidneys are
normal, without renal calculi, focal lesion, or hydronephrosis.
Bladder is unremarkable.

Stomach/Bowel: Unchanged small hiatal hernia. The stomach is
otherwise within normal limits. No bowel wall thickening,
distention, or surrounding inflammatory changes. Sigmoid colonic
diverticulosis again noted. Normal appendix.

Vascular/Lymphatic: Aortic atherosclerosis. No enlarged abdominal or
pelvic lymph nodes.

Reproductive: Unchanged prostatomegaly with median lobe hypertrophy
indenting the bladder base.

Other: No abdominal wall hernia or abnormality. No abdominopelvic
ascites. No pneumoperitoneum.

Musculoskeletal: No acute or significant osseous findings. Unchanged
chronic L1 compression deformity.
IMPRESSION: 1. No acute intra-abdominal process. No CT evidence of acute
pancreatitis.
2. Cholelithiasis.
3. Aortic Atherosclerosis ([id number]).

## 2023-06-12 DIAGNOSIS — G4733 Obstructive sleep apnea (adult) (pediatric): Secondary | ICD-10-CM | POA: Diagnosis not present

## 2023-06-14 ENCOUNTER — Encounter: Payer: Self-pay | Admitting: Internal Medicine

## 2023-06-18 ENCOUNTER — Encounter: Payer: Self-pay | Admitting: Internal Medicine

## 2023-06-19 ENCOUNTER — Other Ambulatory Visit (HOSPITAL_COMMUNITY): Payer: Self-pay | Admitting: Internal Medicine

## 2023-06-19 DIAGNOSIS — R410 Disorientation, unspecified: Secondary | ICD-10-CM

## 2023-06-19 DIAGNOSIS — R269 Unspecified abnormalities of gait and mobility: Secondary | ICD-10-CM

## 2023-06-19 DIAGNOSIS — I10 Essential (primary) hypertension: Secondary | ICD-10-CM | POA: Diagnosis not present

## 2023-06-20 ENCOUNTER — Ambulatory Visit (HOSPITAL_COMMUNITY)
Admission: RE | Admit: 2023-06-20 | Discharge: 2023-06-20 | Disposition: A | Discharge status: Home / Self Care | Source: Ambulatory Visit | Attending: Internal Medicine | Admitting: Internal Medicine

## 2023-06-20 DIAGNOSIS — R410 Disorientation, unspecified: Secondary | ICD-10-CM | POA: Insufficient documentation

## 2023-06-20 DIAGNOSIS — R269 Unspecified abnormalities of gait and mobility: Secondary | ICD-10-CM | POA: Diagnosis not present

## 2023-06-20 DIAGNOSIS — R2689 Other abnormalities of gait and mobility: Secondary | ICD-10-CM | POA: Diagnosis not present

## 2023-06-20 DIAGNOSIS — I6523 Occlusion and stenosis of bilateral carotid arteries: Secondary | ICD-10-CM | POA: Diagnosis not present

## 2023-07-12 ENCOUNTER — Ambulatory Visit (INDEPENDENT_AMBULATORY_CARE_PROVIDER_SITE_OTHER)

## 2023-07-12 DIAGNOSIS — I442 Atrioventricular block, complete: Secondary | ICD-10-CM | POA: Diagnosis not present

## 2023-07-12 LAB — CUP PACEART REMOTE DEVICE CHECK
Battery Remaining Longevity: 120 mo
Battery Remaining Percentage: 95.5 %
Battery Voltage: 3.01 V
Brady Statistic AP VP Percent: 17 %
Brady Statistic AP VS Percent: 1 %
Brady Statistic AS VP Percent: 77 %
Brady Statistic AS VS Percent: 5.5 %
Brady Statistic RA Percent Paced: 18 %
Brady Statistic RV Percent Paced: 93 %
Date Time Interrogation Session: 20250424025124
Implantable Lead Connection Status: 753985
Implantable Lead Connection Status: 753985
Implantable Lead Implant Date: 20250121
Implantable Lead Implant Date: 20250121
Implantable Lead Location: 753859
Implantable Lead Location: 753860
Implantable Pulse Generator Implant Date: 20250121
Lead Channel Impedance Value: 480 Ohm
Lead Channel Impedance Value: 480 Ohm
Lead Channel Pacing Threshold Amplitude: 0.625 V
Lead Channel Pacing Threshold Amplitude: 0.75 V
Lead Channel Pacing Threshold Pulse Width: 0.5 ms
Lead Channel Pacing Threshold Pulse Width: 0.5 ms
Lead Channel Sensing Intrinsic Amplitude: 12 mV
Lead Channel Sensing Intrinsic Amplitude: 4.1 mV
Lead Channel Setting Pacing Amplitude: 0.875
Lead Channel Setting Pacing Amplitude: 1.75 V
Lead Channel Setting Pacing Pulse Width: 0.5 ms
Lead Channel Setting Sensing Sensitivity: 4 mV
Pulse Gen Model: 2272
Pulse Gen Serial Number: 8233318

## 2023-07-16 DIAGNOSIS — L0390 Cellulitis, unspecified: Secondary | ICD-10-CM | POA: Diagnosis not present

## 2023-07-17 ENCOUNTER — Encounter: Payer: Self-pay | Admitting: Internal Medicine

## 2023-07-24 DIAGNOSIS — I499 Cardiac arrhythmia, unspecified: Secondary | ICD-10-CM | POA: Diagnosis not present

## 2023-07-25 ENCOUNTER — Ambulatory Visit: Payer: Medicare Other | Attending: Internal Medicine | Admitting: Internal Medicine

## 2023-07-25 VITALS — BP 142/70 | HR 62 | Ht 69.0 in | Wt 173.0 lb

## 2023-07-25 DIAGNOSIS — I442 Atrioventricular block, complete: Secondary | ICD-10-CM | POA: Diagnosis not present

## 2023-07-25 LAB — CUP PACEART INCLINIC DEVICE CHECK
Battery Remaining Longevity: 121 mo
Battery Voltage: 3.01 V
Brady Statistic RA Percent Paced: 20 %
Brady Statistic RV Percent Paced: 94 %
Date Time Interrogation Session: 20250507145127
Implantable Lead Connection Status: 753985
Implantable Lead Connection Status: 753985
Implantable Lead Implant Date: 20250121
Implantable Lead Implant Date: 20250121
Implantable Lead Location: 753859
Implantable Lead Location: 753860
Implantable Pulse Generator Implant Date: 20250121
Lead Channel Impedance Value: 462.5 Ohm
Lead Channel Impedance Value: 475 Ohm
Lead Channel Pacing Threshold Amplitude: 0.75 V
Lead Channel Pacing Threshold Amplitude: 0.75 V
Lead Channel Pacing Threshold Amplitude: 0.75 V
Lead Channel Pacing Threshold Amplitude: 0.75 V
Lead Channel Pacing Threshold Pulse Width: 0.5 ms
Lead Channel Pacing Threshold Pulse Width: 0.5 ms
Lead Channel Pacing Threshold Pulse Width: 0.5 ms
Lead Channel Pacing Threshold Pulse Width: 0.5 ms
Lead Channel Sensing Intrinsic Amplitude: 4.4 mV
Lead Channel Setting Pacing Amplitude: 1 V
Lead Channel Setting Pacing Amplitude: 1.75 V
Lead Channel Setting Pacing Pulse Width: 0.5 ms
Lead Channel Setting Sensing Sensitivity: 4 mV
Pulse Gen Model: 2272
Pulse Gen Serial Number: 8233318

## 2023-07-25 NOTE — Patient Instructions (Signed)

## 2023-07-25 NOTE — Progress Notes (Signed)
 HPI Mr. Jerauld returns today for followup. He is a pleasant 89 yo man with CHB s/p PPM insertion, HTN, and peripheral neuropathy. He has done well from a CV perspective since placement of his DDD PM. He does have some elevated bp and his amlodipine  was stopped and he was started on coreg 6.25 bid.  Allergies  Allergen Reactions   Doxazosin Mesylate     fatigue   Lisinopril Cough   Amoxicillin -Pot Clavulanate Rash   Codeine Nausea Only   Macrolides And Ketolides Rash     Current Outpatient Medications  Medication Sig Dispense Refill   acetaminophen  (TYLENOL ) 500 MG tablet Take 1,000 mg by mouth every 6 (six) hours as needed for moderate pain.     cetirizine  (ZYRTEC ) 10 MG tablet Take 10 mg by mouth at bedtime.      Cholecalciferol  (VITAMIN D3) 2000 UNITS capsule Take 2,000 Units by mouth 3 (three) times a week.     Coenzyme Q10 (CO Q-10) 300 MG CAPS Take 300 mg by mouth daily.     Cyanocobalamin  (B-12) 5000 MCG CAPS Take 5,000 mcg by mouth once a week.     diclofenac  Sodium (VOLTAREN ) 1 % GEL Apply 2 g topically 4 (four) times daily. (Patient taking differently: Apply 2 g topically 4 (four) times daily as needed (pain).) 100 g 1   esomeprazole  (NEXIUM ) 20 MG capsule Take 20 mg by mouth daily.      fluocinonide (LIDEX) 0.05 % external solution Apply 1 application  topically every evening. 20 drops or 1 ml on the scalp     fluticasone  (FLONASE ) 50 MCG/ACT nasal spray Place 2 sprays into both nostrils at bedtime.      gabapentin  (NEURONTIN ) 300 MG capsule Take 2 capsules (600 mg total) by mouth at bedtime. 180 capsule 3   mometasone (ELOCON) 0.1 % cream Apply 1 application. topically daily.     ondansetron  (ZOFRAN ) 4 MG tablet Take 4 mg by mouth 3 (three) times daily as needed for nausea or vomiting.     saccharomyces boulardii (FLORASTOR) 250 MG capsule Take 250 mg by mouth 2 (two) times daily.     senna-docusate (SENOKOT-S) 8.6-50 MG tablet Take 2 tablets by mouth daily as  needed for mild constipation.     solifenacin (VESICARE) 5 MG tablet Take 5 mg by mouth every morning.     tamsulosin  (FLOMAX ) 0.4 MG CAPS Take 0.4 mg by mouth daily.     traMADol  (ULTRAM ) 50 MG tablet Take 1-2 tablets (50-100 mg total) by mouth daily. (Patient taking differently: Take 100 mg by mouth every evening.) 30 tablet 0   triamcinolone  lotion (KENALOG ) 0.1 % Apply 1 application. topically daily.     No current facility-administered medications for this visit.     Past Medical History:  Diagnosis Date   Abnormal brain MRI    Right side   Basal cell carcinoma of back    BPH (benign prostatic hyperplasia)    Chronic cough    CKD (chronic kidney disease), stage III (HCC)    Concussion 1950   Baseball injury   Diastolic dysfunction    ED (erectile dysfunction)    Foot drop, bilateral 05/02/2013   Gait disorder    Gait disturbance    Gastroesophageal reflux disease    Hyperlipidemia    Hypertension    Libido, decreased    Lumbago    Memory loss    Onychomycosis    OSA (obstructive sleep apnea)  Peripheral neuropathy    PSA elevation    SDH (subdural hematoma) (HCC) 04/30/2013   Skull fracture (HCC)    Skull fracture (HCC)    Sleep apnea     ROS:   All systems reviewed and negative except as noted in the HPI.   Past Surgical History:  Procedure Laterality Date   BIOPSY  03/01/2020   Procedure: BIOPSY;  Surgeon: Genell Ken, MD;  Location: [facility];  Service: Gastroenterology;;   CATARACT EXTRACTION Bilateral    ENDOSCOPIC RETROGRADE CHOLANGIOPANCREATOGRAPHY (ERCP) WITH PROPOFOL  N/A 07/05/2021   Procedure: ENDOSCOPIC RETROGRADE CHOLANGIOPANCREATOGRAPHY (ERCP) WITH PROPOFOL ;  Surgeon: Genell Ken, MD;  Location: [facility];  Service: Gastroenterology;  Laterality: N/A;   ESOPHAGEAL MANOMETRY N/A 01/05/2020   Procedure: ESOPHAGEAL MANOMETRY (EM);  Surgeon: Genell Ken, MD;  Location: [facility];  Service: Gastroenterology;  Laterality: N/A;    ESOPHAGOGASTRODUODENOSCOPY (EGD) WITH PROPOFOL  N/A 06/11/2018   Procedure: ESOPHAGOGASTRODUODENOSCOPY (EGD) WITH PROPOFOL ;  Surgeon: Ozell Blunt, MD;  Location: [facility];  Service: Endoscopy;  Laterality: N/A;   ESOPHAGOGASTRODUODENOSCOPY (EGD) WITH PROPOFOL  N/A 03/01/2020   Procedure: ESOPHAGOGASTRODUODENOSCOPY (EGD) WITH PROPOFOL  With Botox  injections;  Surgeon: Genell Ken, MD;  Location: [facility];  Service: Gastroenterology;  Laterality: N/A;   KNEE SURGERY Left    LAPAROSCOPIC CHOLECYSTECTOMY SINGLE SITE WITH INTRAOPERATIVE CHOLANGIOGRAM N/A 01/28/2021   Procedure: Laparscopic Cholecystectomy;  Drainage of empyema of gallbladder; Oversew of cystic duct with omentopexy;  Surgeon: Candyce Champagne, MD;  Location: [facility];  Service: General;  Laterality: N/A;   PACEMAKER IMPLANT N/A 04/10/2023   Procedure: PACEMAKER IMPLANT;  Surgeon: Tammie Fall, MD;  Location: [facility] CV [facility];  Service: Cardiovascular;  Laterality: N/A;   REMOVAL OF STONES  07/05/2021   Procedure: REMOVAL OF STONES;  Surgeon: Genell Ken, MD;  Location: [facility];  Service: Gastroenterology;;   skin cancer resection     basal cell, [place] carcinoma   SPHINCTEROTOMY  07/05/2021   Procedure: SPHINCTEROTOMY;  Surgeon: Genell Ken, MD;  Location: [facility];  Service: Gastroenterology;;   SUBMUCOSAL INJECTION  03/01/2020   Procedure: SUBMUCOSAL INJECTION;  Surgeon: Genell Ken, MD;  Location: [facility];  Service: Gastroenterology;;   TONSILLECTOMY     VASECTOMY       Family History  Problem Relation Age of Onset   Cancer Mother        Breast cancer   Heart attack Father 9   CAD Father    Heart disease Brother    Lupus Daughter      Social History   Socioeconomic History   Marital status: Married    Spouse name: Not on file   Number of children: 4   Years of education: 18   Highest education level: Not on file  Occupational History   Occupation: Retired  Tobacco Use   Smoking status: Former     Current packs/day: 0.00    Types: Cigarettes    Quit date: 08/18/1984    Years since quitting: 38.9    Passive exposure: Never   Smokeless tobacco: Never  Vaping Use   Vaping status: Never Used  Substance and Sexual Activity   Alcohol use: No   Drug use: No   Sexual activity: Not on file  Other Topics Concern   Not on file  Social History Narrative   Patient states he is ambidextrous but is predominantly left handed.   Patient drinks 3 cups of coffee per day.   Social Drivers of Corporate investment banker Strain: Not on  file  Food Insecurity: No Food Insecurity (04/11/2023)   Hunger Vital Sign    Worried About Running Out of Food in the Last Year: Never true    Ran Out of Food in the Last Year: Never true  Transportation Needs: No Transportation Needs (04/11/2023)   PRAPARE - Administrator, Civil Service (Medical): No    Lack of Transportation (Non-Medical): No  Physical Activity: Not on file  Stress: Not on file  Social Connections: Moderately Integrated (04/11/2023)   Social Connection and Isolation Panel [NHANES]    Frequency of Communication with Friends and Family: More than three times a week    Frequency of Social Gatherings with Friends and Family: More than three times a week    Attends Religious Services: 1 to 4 times per year    Active Member of [employer] or Organizations: No    Attends Banker Meetings: Never    Marital Status: Married  Catering manager Violence: Not At Risk (04/11/2023)   Humiliation, Afraid, Rape, and Kick questionnaire    Fear of Current or Ex-Partner: No    Emotionally Abused: No    Physically Abused: No    Sexually Abused: No     BP (!) 142/70   Pulse 62   Ht 5\' 9"  (1.753 m)   Wt 173 lb (78.5 kg)   SpO2 95%   BMI 25.55 kg/m   Physical Exam:  Well appearing NAD HEENT: Unremarkable Neck:  No JVD, no thyromegally Lymphatics:  No adenopathy Back:  No CVA tenderness Lungs:  Clear with no wheezes HEART:   Regular rate rhythm, no murmurs, no rubs, no clicks Abd:  soft, positive bowel sounds, no organomegally, no rebound, no guarding Ext:  2 plus pulses, no edema, no cyanosis, no clubbing Skin:  No rashes no nodules Neuro:  CN II through XII intact, motor grossly intact  EKG - nsr with ventricular pacing  DEVICE  Normal device function.  See PaceArt for details.   Assess/Plan: CHB - he is s/p DDD PPM and doing well. He has no escape today.  PPM - his St. Jude DDD PM is working normally.  HTN - we discussed uptitration of his bp meds. We will hold off for now but I encouraged his daughter to reach out to his primary MD if the SBP averages over 140-145.  Pete Brand Levora Werden,MD

## 2023-07-31 DIAGNOSIS — L0390 Cellulitis, unspecified: Secondary | ICD-10-CM | POA: Diagnosis not present

## 2023-07-31 DIAGNOSIS — R21 Rash and other nonspecific skin eruption: Secondary | ICD-10-CM | POA: Diagnosis not present

## 2023-07-31 DIAGNOSIS — N1831 Chronic kidney disease, stage 3a: Secondary | ICD-10-CM | POA: Diagnosis not present

## 2023-07-31 DIAGNOSIS — I130 Hypertensive heart and chronic kidney disease with heart failure and stage 1 through stage 4 chronic kidney disease, or unspecified chronic kidney disease: Secondary | ICD-10-CM | POA: Diagnosis not present

## 2023-08-07 DIAGNOSIS — C44629 Squamous cell carcinoma of skin of left upper limb, including shoulder: Secondary | ICD-10-CM | POA: Diagnosis not present

## 2023-08-22 NOTE — Progress Notes (Signed)
 Remote pacemaker transmission.

## 2023-09-11 DIAGNOSIS — J208 Acute bronchitis due to other specified organisms: Secondary | ICD-10-CM | POA: Diagnosis not present

## 2023-09-11 DIAGNOSIS — I129 Hypertensive chronic kidney disease with stage 1 through stage 4 chronic kidney disease, or unspecified chronic kidney disease: Secondary | ICD-10-CM | POA: Diagnosis not present

## 2023-10-09 DIAGNOSIS — C44329 Squamous cell carcinoma of skin of other parts of face: Secondary | ICD-10-CM | POA: Diagnosis not present

## 2023-10-11 ENCOUNTER — Ambulatory Visit

## 2023-10-11 DIAGNOSIS — I442 Atrioventricular block, complete: Secondary | ICD-10-CM
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
## 2023-10-15 ENCOUNTER — Ambulatory Visit: Payer: Self-pay | Admitting: Internal Medicine

## 2023-10-15 LAB — CUP PACEART REMOTE DEVICE CHECK
Battery Remaining Longevity: 119 mo
Battery Remaining Percentage: 95.5 %
Battery Voltage: 3.01 V
Brady Statistic AP VP Percent: 17 %
Brady Statistic AP VS Percent: 3.8 %
Brady Statistic AS VP Percent: 62 %
Brady Statistic AS VS Percent: 18 %
Brady Statistic RA Percent Paced: 20 %
Brady Statistic RV Percent Paced: 78 %
Date Time Interrogation Session: 20250726092804
Implantable Lead Connection Status: 753985
Implantable Lead Connection Status: 753985
Implantable Lead Implant Date: 20250121
Implantable Lead Implant Date: 20250121
Implantable Lead Location: 753859
Implantable Lead Location: 753860
Implantable Pulse Generator Implant Date: 20250121
Lead Channel Impedance Value: 430 Ohm
Lead Channel Impedance Value: 450 Ohm
Lead Channel Pacing Threshold Amplitude: 0.5 V
Lead Channel Pacing Threshold Amplitude: 0.75 V
Lead Channel Pacing Threshold Pulse Width: 0.5 ms
Lead Channel Pacing Threshold Pulse Width: 0.5 ms
Lead Channel Sensing Intrinsic Amplitude: 12 mV
Lead Channel Sensing Intrinsic Amplitude: 3.1 mV
Lead Channel Setting Pacing Amplitude: 1 V
Lead Channel Setting Pacing Amplitude: 1.5 V
Lead Channel Setting Pacing Pulse Width: 0.5 ms
Lead Channel Setting Sensing Sensitivity: 4 mV
Pulse Gen Model: 2272
Pulse Gen Serial Number: 8233318

## 2023-10-17 ENCOUNTER — Encounter: Payer: Self-pay | Admitting: Neurology

## 2023-10-17 ENCOUNTER — Ambulatory Visit: Admitting: Neurology

## 2023-10-17 VITALS — BP 115/54 | HR 72 | Ht 68.0 in | Wt 179.0 lb

## 2023-10-17 DIAGNOSIS — R3915 Urgency of urination: Secondary | ICD-10-CM | POA: Diagnosis not present

## 2023-10-17 DIAGNOSIS — R269 Unspecified abnormalities of gait and mobility: Secondary | ICD-10-CM

## 2023-10-17 DIAGNOSIS — R200 Anesthesia of skin: Secondary | ICD-10-CM | POA: Diagnosis not present

## 2023-10-17 DIAGNOSIS — G629 Polyneuropathy, unspecified: Secondary | ICD-10-CM

## 2023-10-17 DIAGNOSIS — F03A0 Unspecified dementia, mild, without behavioral disturbance, psychotic disturbance, mood disturbance, and anxiety: Secondary | ICD-10-CM | POA: Diagnosis not present

## 2023-10-22 ENCOUNTER — Other Ambulatory Visit: Payer: Self-pay | Admitting: Neurology

## 2023-10-22 ENCOUNTER — Ambulatory Visit: Payer: Self-pay | Admitting: Neurology

## 2023-10-22 DIAGNOSIS — R269 Unspecified abnormalities of gait and mobility: Secondary | ICD-10-CM

## 2023-10-22 DIAGNOSIS — I442 Atrioventricular block, complete: Secondary | ICD-10-CM

## 2023-10-22 LAB — ATN PROFILE
A -- Beta-amyloid 42/40 Ratio: 0.119 (ref >0.102)
Beta-amyloid 40: 164.75 pg/mL
Beta-amyloid 42: 19.61 pg/mL
N -- NfL, Plasma: 4.6 pg/mL (ref 0.00–9.13)
T -- p-tau181: 1.39 pg/mL — ABNORMAL HIGH (ref 0.00–0.97)

## 2023-10-22 LAB — MULTIPLE MYELOMA PANEL, SERUM
Albumin SerPl Elph-Mcnc: 3.6 g/dL (ref 2.9–4.4)
Albumin/Glob SerPl: 1.3 (ref 0.7–1.7)
Alpha 1: 0.2 g/dL (ref 0.0–0.4)
Alpha2 Glob SerPl Elph-Mcnc: 0.6 g/dL (ref 0.4–1.0)
B-Globulin SerPl Elph-Mcnc: 1 g/dL (ref 0.7–1.3)
Gamma Glob SerPl Elph-Mcnc: 1.1 g/dL (ref 0.4–1.8)
Globulin, Total: 2.9 g/dL (ref 2.2–3.9)
IgA/Immunoglobulin A, Serum: 230 mg/dL (ref 61–437)
IgG (Immunoglobin G), Serum: 1215 mg/dL (ref 603–1613)
IgM (Immunoglobulin M), Srm: 185 mg/dL — AB (ref 15–143)
Total Protein: 6.5 g/dL (ref 6.0–8.5)

## 2023-10-22 LAB — VITAMIN B12: Vitamin B-12: 1138 pg/mL (ref 232–1245)

## 2023-10-23 ENCOUNTER — Telehealth: Payer: Self-pay | Admitting: Neurology

## 2023-10-23 NOTE — Telephone Encounter (Signed)
 No auth required sent to [facility] [phone]

## 2023-11-06 DIAGNOSIS — I129 Hypertensive chronic kidney disease with stage 1 through stage 4 chronic kidney disease, or unspecified chronic kidney disease: Secondary | ICD-10-CM | POA: Diagnosis not present

## 2023-11-06 DIAGNOSIS — R54 Age-related physical debility: Secondary | ICD-10-CM | POA: Diagnosis not present

## 2023-11-06 DIAGNOSIS — N1831 Chronic kidney disease, stage 3a: Secondary | ICD-10-CM | POA: Diagnosis not present

## 2023-11-06 DIAGNOSIS — Z136 Encounter for screening for cardiovascular disorders: Secondary | ICD-10-CM | POA: Diagnosis not present

## 2023-11-06 DIAGNOSIS — Z0000 Encounter for general adult medical examination without abnormal findings: Secondary | ICD-10-CM | POA: Diagnosis not present

## 2023-11-06 DIAGNOSIS — G6289 Other specified polyneuropathies: Secondary | ICD-10-CM | POA: Diagnosis not present

## 2023-11-09 NOTE — CV Procedure (Signed)
  Device system confirmed to be MRI conditional, with implant date > 6 weeks ago, and no evidence of abandoned or epicardial leads in review of most recent CXR  Device last cleared by EP Provider: Prentice Passey 11/08/23  Clearance is good through for 1 year as long as parameters remain stable at time of check. If pt undergoes a cardiac device procedure during that time, they should be re-cleared.   Tachy-therapies to be programmed off if applicable with device back to pre-MRI settings after completion of exam.  [employer] will be present for programming for the MRI.   Rocky Catalan, RT  11/09/2023 8:09 AM

## 2023-11-13 ENCOUNTER — Ambulatory Visit (HOSPITAL_COMMUNITY)
Admission: RE | Admit: 2023-11-13 | Discharge: 2023-11-13 | Disposition: A | Discharge status: Home / Self Care | Source: Ambulatory Visit | Attending: Neurology | Admitting: Neurology

## 2023-11-13 DIAGNOSIS — R269 Unspecified abnormalities of gait and mobility: Secondary | ICD-10-CM

## 2023-11-13 DIAGNOSIS — I442 Atrioventricular block, complete: Secondary | ICD-10-CM | POA: Insufficient documentation

## 2023-11-13 NOTE — Progress Notes (Signed)
 Patient was monitored by this RN during MRI scan due to presence of a pacemaker. Cardiac rhythm was continuously monitored throughout the procedure. Prior to the start of the scan, the pacemaker was placed in MRI-safe mode by the MRI technician and/or pacemaker representative. Following the completion of the scan, the device was returned to its pre-MRI settings. Neurological status and orientation post-procedure were unchanged from baseline.   Pre-procedure Heart Rate (Prior to being placed in MRI safe mode): 65  Post-procedure Heart Rate (Once pacemaker is returned to baseline mode):  60

## 2023-11-14 DIAGNOSIS — C44329 Squamous cell carcinoma of skin of other parts of face: Secondary | ICD-10-CM | POA: Diagnosis not present

## 2023-11-20 ENCOUNTER — Ambulatory Visit: Payer: Self-pay | Admitting: Neurology

## 2023-12-20 NOTE — Progress Notes (Signed)
 Remote PPM Transmission

## 2023-12-23 ENCOUNTER — Encounter: Payer: Self-pay | Admitting: Internal Medicine

## 2024-01-04 DIAGNOSIS — T148XXA Other injury of unspecified body region, initial encounter: Secondary | ICD-10-CM | POA: Diagnosis not present

## 2024-01-04 DIAGNOSIS — Z23 Encounter for immunization: Secondary | ICD-10-CM | POA: Diagnosis not present

## 2024-01-04 DIAGNOSIS — M7989 Other specified soft tissue disorders: Secondary | ICD-10-CM

## 2024-01-04 DIAGNOSIS — I129 Hypertensive chronic kidney disease with stage 1 through stage 4 chronic kidney disease, or unspecified chronic kidney disease: Secondary | ICD-10-CM | POA: Diagnosis not present

## 2024-01-04 DIAGNOSIS — G4733 Obstructive sleep apnea (adult) (pediatric): Secondary | ICD-10-CM | POA: Diagnosis not present

## 2024-01-04 DIAGNOSIS — L0390 Cellulitis, unspecified: Secondary | ICD-10-CM | POA: Diagnosis not present

## 2024-01-10 ENCOUNTER — Ambulatory Visit

## 2024-01-10 DIAGNOSIS — I442 Atrioventricular block, complete: Secondary | ICD-10-CM

## 2024-01-10 LAB — CUP PACEART REMOTE DEVICE CHECK
Battery Remaining Longevity: 116 mo
Battery Remaining Percentage: 95 %
Battery Voltage: 3.01 V
Brady Statistic AP VP Percent: 17 %
Brady Statistic AP VS Percent: 3.3 %
Brady Statistic AS VP Percent: 66 %
Brady Statistic AS VS Percent: 13 %
Brady Statistic RA Percent Paced: 21 %
Brady Statistic RV Percent Paced: 83 %
Date Time Interrogation Session: 20251023020015
Implantable Lead Connection Status: 753985
Implantable Lead Connection Status: 753985
Implantable Lead Implant Date: 20250121
Implantable Lead Implant Date: 20250121
Implantable Lead Location: 753859
Implantable Lead Location: 753860
Implantable Pulse Generator Implant Date: 20250121
Lead Channel Impedance Value: 410 Ohm
Lead Channel Impedance Value: 450 Ohm
Lead Channel Pacing Threshold Amplitude: 0.75 V
Lead Channel Pacing Threshold Amplitude: 0.75 V
Lead Channel Pacing Threshold Pulse Width: 0.5 ms
Lead Channel Pacing Threshold Pulse Width: 0.5 ms
Lead Channel Sensing Intrinsic Amplitude: 12 mV
Lead Channel Sensing Intrinsic Amplitude: 3.3 mV
Lead Channel Setting Pacing Amplitude: 1 V
Lead Channel Setting Pacing Amplitude: 1.75 V
Lead Channel Setting Pacing Pulse Width: 0.5 ms
Lead Channel Setting Sensing Sensitivity: 4 mV
Pulse Gen Model: 2272
Pulse Gen Serial Number: 8233318

## 2024-01-11 ENCOUNTER — Ambulatory Visit: Payer: Self-pay | Admitting: Internal Medicine

## 2024-01-11 NOTE — Progress Notes (Signed)
 Remote PPM Transmission

## 2024-01-29 ENCOUNTER — Encounter: Payer: Self-pay | Admitting: Internal Medicine

## 2024-01-30 ENCOUNTER — Other Ambulatory Visit: Payer: Self-pay

## 2024-01-30 DIAGNOSIS — I447 Left bundle-branch block, unspecified: Secondary | ICD-10-CM

## 2024-01-30 DIAGNOSIS — I442 Atrioventricular block, complete: Secondary | ICD-10-CM

## 2024-01-30 DIAGNOSIS — I5032 Chronic diastolic (congestive) heart failure: Secondary | ICD-10-CM

## 2024-01-30 DIAGNOSIS — I10 Essential (primary) hypertension: Secondary | ICD-10-CM

## 2024-02-26 ENCOUNTER — Ambulatory Visit: Attending: Internal Medicine | Admitting: Internal Medicine

## 2024-02-26 VITALS — BP 138/75 | HR 60 | Ht 69.0 in | Wt 183.0 lb

## 2024-02-26 DIAGNOSIS — I10 Essential (primary) hypertension: Secondary | ICD-10-CM

## 2024-02-26 DIAGNOSIS — I447 Left bundle-branch block, unspecified: Secondary | ICD-10-CM

## 2024-02-26 DIAGNOSIS — N1832 Chronic kidney disease, stage 3b: Secondary | ICD-10-CM

## 2024-02-26 DIAGNOSIS — I442 Atrioventricular block, complete: Secondary | ICD-10-CM

## 2024-02-26 DIAGNOSIS — I5032 Chronic diastolic (congestive) heart failure: Secondary | ICD-10-CM

## 2024-02-26 MED ORDER — HYDROCHLOROTHIAZIDE 12.5 MG PO CAPS: 12.5000 mg | ORAL_CAPSULE | Freq: Every day | ORAL | 3 refills | Status: AC

## 2024-02-26 NOTE — Patient Instructions (Addendum)
 Medication Instructions:  Your physician has recommended you make the following change in your medication:  START: hydrochlorothiazide  (H CTZ)) 12.5 mg by mouth once daily  *If you need a refill on your cardiac medications before your next appointment, please call your pharmacy*  Lab Work: BMP in 2 weeks at any [facility]   If you have labs (blood work) drawn today and your tests are completely normal, you will receive your results only by: MyChart Message (if you have MyChart) OR A paper copy in the mail If you have any lab test that is abnormal or we need to change your treatment, we will call you to review the results.  Testing/Procedures: Your physician has referred you to see our Pharmacy Team.    Follow-Up: At [facility], you and your health needs are our priority.  As part of our continuing mission to provide you with exceptional heart care, our providers are all part of one team.  This team includes your primary Cardiologist (physician) and Advanced Practice Providers or APPs (Physician Assistants and Nurse Practitioners) who all work together to provide you with the care you need, when you need it.  Your next appointment:   1 year(s)  Provider:   Stanly DELENA Leavens, MD

## 2024-02-26 NOTE — Progress Notes (Signed)
 Cardiology Office Note:  .    Date:  02/26/2024  ID:  Timothy Khan, DOB October 01, 1931, MRN [id number] PCP: Elliot Charm, MD  [facility] Providers Cardiologist:  Stanly DELENA Leavens, MD     CC: HTN Consulted for the evaluation of Elevated BP at the behest of Dr. Waddell  History of Present Illness: .    Timothy Khan is a 89 y.o. male  with hypertension and chronic kidney disease who presents with difficulty controlling blood pressure. He is accompanied by his daughter, Timothy Khan via speaker phone. He was referred by Dr. Waddell for evaluation of uncontrolled blood pressure.  He has been experiencing difficulty controlling his blood pressure, with recent readings as high as 190/110 mmHg, leading to dizziness on two or three occasions. His medication regimen includes carvedilol 12.5 mg, adjusted in late November, and hydralazine  25 mg as needed for systolic readings over 170 mmHg. Recently, his blood pressure has improved, with readings of 142/71 mmHg in the morning and 138/75 mmHg in the afternoon.  He has a history of a pacemaker placement on January 21st of this year. An echocardiogram earlier this year showed good cardiac function without significant valve disease.  He has chronic kidney disease and has been told his creatinine level is elevated. He is a distant smoker, having quit in 1986, and has a family history of coronary artery disease. He had a negative stress test in 2023 and a heart catheterization over 20 years ago showing widely patent coronaries.  He reports a persistent cough since early summer, which has been treated with doxycycline  and penicillin, but persists despite a normal chest x-ray. He also experiences leg swelling, which has been evaluated by a vascular specialist with large vessel ultrasound imaging. He was previously on amlodipine , which was discontinued due to concerns about leg swelling, and switched to carvedilol.  Discussed the use  of AI scribe software for clinical note transcription with the patient, who gave verbal consent to proceed.   Relevant histories: .  Social  - former [facility] patient ROS: As per HPI.   Studies Reviewed: .     Cardiac Studies & Procedures   ______________________________________________________________________________________________   STRESS TESTS  MYOCARDIAL PERFUSION IMAGING 04/04/2021  Interpretation Summary   The study is normal. The study is low risk.   No ST deviation was noted.   LV perfusion is normal. There is no evidence of ischemia. There is no evidence of infarction.   Left ventricular function is normal. Nuclear stress EF: 66 %. The left ventricular ejection fraction is hyperdynamic (>65%). End diastolic cavity size is normal.   Prior study not available for comparison.  Normal study without evidence of ischemia or infarction. Normal LVEF, >65%. This is a low-risk study.   ECHOCARDIOGRAM  ECHOCARDIOGRAM COMPLETE 04/10/2023  Narrative ECHOCARDIOGRAM REPORT    Patient Name:   Timothy Khan Date of Exam: 04/10/2023 Medical Rec #:  [id number]            Height:       68.0 in Accession #:    [id number]           Weight:       180.0 lb Date of Birth:  11-23-1931           BSA:          1.954 m Patient Age:    89 years             BP:  152/50 mmHg Patient Gender: M                    HR:           43 bpm. Exam Location:  Inpatient  Procedure: 2D Echo, Cardiac Doppler and Color Doppler  Indications:    I44.2 Complete heart block  History:        Patient has prior history of Echocardiogram examinations, most recent 03/14/2021. Abnormal ECG, Arrythmias:Heart block and LBBB; Risk Factors:Former Smoker and Hypertension.  Sonographer:    Ellouise Mose RDCS Referring Phys: [id number] NATHAN P GOODWIN  IMPRESSIONS   1. Left ventricular ejection fraction, by estimation, is 70 to 75%. The left ventricle has hyperdynamic function. The left ventricle has no  regional wall motion abnormalities. Left ventricular diastolic parameters are indeterminate. 2. Right ventricular systolic function is normal. The right ventricular size is normal. 3. Diastolic mitral insufficiency is seen due to high grade AV block. The mitral valve is normal in structure. Trivial mitral valve regurgitation. No evidence of mitral stenosis. 4. The aortic valve is tricuspid. Aortic valve regurgitation is not visualized. No aortic stenosis is present. 5. The inferior vena cava is dilated in size with >50% respiratory variability, suggesting right atrial pressure of 8 mmHg.  FINDINGS Left Ventricle: Left ventricular ejection fraction, by estimation, is 70 to 75%. The left ventricle has hyperdynamic function. The left ventricle has no regional wall motion abnormalities. The left ventricular internal cavity size was normal in size. There is no left ventricular hypertrophy. Left ventricular diastolic parameters are indeterminate.  Right Ventricle: The right ventricular size is normal. No increase in right ventricular wall thickness. Right ventricular systolic function is normal.  Left Atrium: Left atrial size was normal in size.  Right Atrium: Right atrial size was normal in size.  Pericardium: There is no evidence of pericardial effusion.  Mitral Valve: Diastolic mitral insufficiency is seen due to high grade AV block. The mitral valve is normal in structure. Mild mitral annular calcification. Trivial mitral valve regurgitation. No evidence of mitral valve stenosis.  Tricuspid Valve: The tricuspid valve is normal in structure. Tricuspid valve regurgitation is trivial. No evidence of tricuspid stenosis.  Aortic Valve: The aortic valve is tricuspid. Aortic valve regurgitation is not visualized. No aortic stenosis is present.  Pulmonic Valve: The pulmonic valve was grossly normal. Pulmonic valve regurgitation is not visualized. No evidence of pulmonic stenosis.  Aorta: The aortic  root and ascending aorta are structurally normal, with no evidence of dilitation.  Venous: The inferior vena cava is dilated in size with greater than 50% respiratory variability, suggesting right atrial pressure of 8 mmHg.  IAS/Shunts: No atrial level shunt detected by color flow Doppler.   LEFT VENTRICLE PLAX 2D LVIDd:         3.90 cm     Diastology LVIDs:         2.50 cm     LV e' medial:    12.30 cm/s LV PW:         1.00 cm     LV E/e' medial:  7.6 LV IVS:        1.00 cm     LV e' lateral:   9.03 cm/s LVOT diam:     2.40 cm     LV E/e' lateral: 10.3 LV SV:         113 LV SV Index:   58 LVOT Area:     4.52 cm  LV Volumes (MOD) LV vol  d, MOD A2C: 70.9 ml LV vol d, MOD A4C: 69.9 ml LV vol s, MOD A2C: 16.8 ml LV vol s, MOD A4C: 19.2 ml LV SV MOD A2C:     54.1 ml LV SV MOD A4C:     69.9 ml LV SV MOD BP:      52.1 ml  RIGHT VENTRICLE             IVC RV S prime:     10.10 cm/s  IVC diam: 2.95 cm TAPSE (M-mode): 2.3 cm  LEFT ATRIUM             Index        RIGHT ATRIUM           Index LA diam:        3.40 cm 1.74 cm/m   RA Area:     10.60 cm LA Vol (A2C):   20.1 ml 10.29 ml/m  RA Volume:   20.40 ml  10.44 ml/m LA Vol (A4C):   20.4 ml 10.44 ml/m LA Biplane Vol: 19.3 ml 9.88 ml/m AORTIC VALVE LVOT Vmax:   120.00 cm/s LVOT Vmean:  75.200 cm/s LVOT VTI:    0.250 m  AORTA Ao Root diam: 3.70 cm Ao Asc diam:  3.40 cm  MITRAL VALVE MV Area (PHT): 2.68 cm    SHUNTS MV Decel Time: 284 msec    Systemic VTI:  0.25 m MV E velocity: 93.35 cm/s  Systemic Diam: 2.40 cm MV A velocity: 96.40 cm/s MV E/A ratio:  0.97  Mihai Croitoru MD Electronically signed by Jerel Balding MD Signature Date/Time: 04/10/2023/3:09:45 PM    Final          ______________________________________________________________________________________________      Physical Exam:    VS:  BP 138/75 (BP Location: Right Arm)   Pulse 60   Ht 5' 9 (1.753 m)   Wt 183 lb (83 kg)   SpO2 93%    BMI 27.02 kg/m    Wt Readings from Last 3 Encounters:  02/26/24 183 lb (83 kg)  10/17/23 179 lb (81.2 kg)  07/25/23 173 lb (78.5 kg)    Gen: no distress Ears:  Dempsey Sign Cardiac: No Rubs or Gallops, no Murmur, RRR +2 radial pulses Respiratory: Clear to auscultation bilaterally, normal effort, normal  respiratory rate GI: Soft, nontender, non-distended  MS: +1 bilateral edema;  moves all extremities Integument: Skin feels warm Neuro:  At time of evaluation, alert and oriented to person/place/time/situation  Psych: Normal affect, patient feels ok     ASSESSMENT AND PLAN: .     An EKG was ordered  and shows AV Pacing  Primary hypertension Hypertension with recent difficulty in control, though recent blood pressure readings have improved. Current medications include carvedilol 12.5 mg and hydralazine  as needed for systolic BP over 829. Blood pressure readings have been variable, with occasional dizziness at higher levels. Carvedilol appears to be effective in stabilizing blood pressure. Consideration of diuretic therapy to address fluid retention and potentially improve blood pressure control. Conservative approach due to age and chronic kidney disease. - Continue carvedilol 12.5 mg daily. - Prescribed hydrochlorothiazide  12.5 mg orally daily. - Unchanged swelling off amlodipine  - Ordered labs to monitor kidney function in a couple of weeks. - Scheduled follow-up to assess blood pressure control and kidney function.  Complete heart block status post pacemaker placement Complete heart block with pacemaker placement on January 21st, 2025. Pacemaker functioning appropriately, with no immediate concerns. Pacemaker is necessary due to age-related conduction  system disease.  Chronic kidney disease stage 3A Creatinine at 2.05. Monitoring is necessary, especially with the introduction of diuretic therapy. Conservative management due to age and potential impact on kidney function. - Ordered  labs to monitor kidney function in a couple of weeks.  Lower extremity edema Likely related to fluid retention. Previous use of amlodipine  may have contributed, but currently off amlodipine . Diuretic therapy considered to address fluid retention and potentially improve blood pressure control. - Prescribed hydrochlorothiazide  12.5 mg orally daily.  Pharm D January APP 3 months   Stanly Leavens, MD FASE [facility] Cardiologist [facility]  [address] [place], [address] [place], [place] [address] [phone]  5:11 PM

## 2024-03-07 ENCOUNTER — Other Ambulatory Visit: Payer: Self-pay

## 2024-03-07 DIAGNOSIS — I5032 Chronic diastolic (congestive) heart failure: Secondary | ICD-10-CM

## 2024-03-07 DIAGNOSIS — I447 Left bundle-branch block, unspecified: Secondary | ICD-10-CM

## 2024-03-07 DIAGNOSIS — I10 Essential (primary) hypertension: Secondary | ICD-10-CM

## 2024-03-07 DIAGNOSIS — I442 Atrioventricular block, complete: Secondary | ICD-10-CM

## 2024-03-07 NOTE — Telephone Encounter (Signed)
 Referral placed.

## 2024-03-12 LAB — BASIC METABOLIC PANEL WITH GFR
BUN/Creatinine Ratio: 17 (ref 10–24)
BUN: 23 mg/dL (ref 10–36)
CO2: 26 mmol/L (ref 20–29)
Calcium: 9.4 mg/dL (ref 8.6–10.2)
Chloride: 101 mmol/L (ref 96–106)
Creatinine, Ser: 1.38 mg/dL — ABNORMAL HIGH (ref 0.76–1.27)
Glucose: 81 mg/dL (ref 70–99)
Potassium: 4.8 mmol/L (ref 3.5–5.2)
Sodium: 140 mmol/L (ref 134–144)
eGFR: 49 mL/min/1.73 — ABNORMAL LOW

## 2024-03-15 ENCOUNTER — Ambulatory Visit: Payer: Self-pay | Admitting: Internal Medicine

## 2024-04-08 ENCOUNTER — Ambulatory Visit: Attending: Cardiology | Admitting: Pharmacist Clinician (PhC)/ Clinical Pharmacy Specialist

## 2024-04-08 ENCOUNTER — Encounter: Payer: Self-pay | Admitting: Pharmacist Clinician (PhC)/ Clinical Pharmacy Specialist

## 2024-04-08 VITALS — BP 144/62 | HR 64

## 2024-04-08 DIAGNOSIS — I10 Essential (primary) hypertension: Secondary | ICD-10-CM | POA: Diagnosis not present

## 2024-04-08 NOTE — Assessment & Plan Note (Signed)
 Assessment: BP is uncontrolled in office BP 144/62 mmHg;  above the goal (<140/80). Tolerates carvedilol and HCTZ well, without any side effects Has not taken any hydralazine  in the past month Denies SOB, palpitation, chest pain, headaches,or swelling Home readings monitored by Margarete MD  Plan:  Continue taking carvedilol 12.5. mg bid, hctz 12.5 mg daily, hydralazine  25 mg prn SBP > 170 Patient to keep record of BP readings with heart rate and report to us  at the next visit Patient to follow up with APP in 2-3 months  Labs ordered today:  none

## 2024-04-08 NOTE — Patient Instructions (Signed)
 Follow up appointment: WITH APP IN MARCH/APRIL  Take your BP meds as follows: CONTINUE WITH YOUR CURRENT MEDICATIONS  Check your blood pressure at home daily and keep record of the readings.  Your blood pressure goal is < 140/80  To check your pressure at home you will need to:  1. Sit up in a chair, with feet flat on the floor and back supported. Do not cross your ankles or legs. 2. Rest your left arm so that the cuff is about heart level. If the cuff goes on your upper arm,  then just relax the arm on the table, arm of the chair or your lap. If you have a wrist cuff, we  suggest relaxing your wrist against your chest (think of it as Pledging the Flag with the  wrong arm).  3. Place the cuff snugly around your arm, about 1 inch above the crook of your elbow. The  cords should be inside the groove of your elbow.  4. Sit quietly, with the cuff in place, for about 5 minutes. After that 5 minutes press the power  button to start a reading. 5. Do not talk or move while the reading is taking place.  6. Record your readings on a sheet of paper. Although most cuffs have a memory, it is often  easier to see a pattern developing when the numbers are all in front of you.  7. You can repeat the reading after 1-3 minutes if it is recommended  Make sure your bladder is empty and you have not had caffeine or tobacco within the last 30 min  Always bring your blood pressure log with you to your appointments. If you have not brought your monitor in to be double checked for accuracy, please bring it to your next appointment.  You can find a list of quality blood pressure cuffs at [email or web address].no  Important lifestyle changes to control high blood pressure  Intervention  Effect on the BP  Lose extra pounds and watch your waistline Weight loss is one of the most effective lifestyle changes for controlling blood pressure. If you're overweight or obese, losing even a small amount of weight can help reduce  blood pressure. Blood pressure might go down by about 1 millimeter of mercury (mm Hg) with each kilogram (about 2.2 pounds) of weight lost.  Exercise regularly As a general goal, aim for at least 30 minutes of moderate physical activity every day. Regular physical activity can lower high blood pressure by about 5 to 8 mm Hg.  Eat a healthy diet Eating a diet rich in whole grains, fruits, vegetables, and low-fat dairy products and low in saturated fat and cholesterol. A healthy diet can lower high blood pressure by up to 11 mm Hg.  Reduce salt (sodium) in your diet Even a small reduction of sodium in the diet can improve heart health and reduce high blood pressure by about 5 to 6 mm Hg.  Limit alcohol One drink equals 12 ounces of beer, 5 ounces of wine, or 1.5 ounces of 80-proof liquor.  Limiting alcohol to less than one drink a day for women or two drinks a day for men can help lower blood pressure by about 4 mm Hg.   If you have any questions or concerns please use My Chart to send questions or call the office at [phone]

## 2024-04-08 NOTE — Progress Notes (Signed)
 "  Office Visit    Patient Name: JAGJIT RINER Date of Encounter: 04/08/2024  Primary Care Provider:  Elliot Charm, MD Primary Cardiologist:  Stanly DELENA Leavens, MD  Chief Complaint    Hypertension  Significant Past Medical History   Heart block PPM 1/25  CKD 3A, GFR at 49 (was down to 30 1 year ago)  LEE Evaluated by [facility] - encouraged compression socks, walking/exercise    Allergies[1]  History of Present Illness    Timothy Khan is a 89 y.o. male patient of Dr Leavens, in the office today for hypertension evaluation.  He was seen by Dr. Leavens in December with a BP of 138/75.  It was reported that the patient was having swings in his home readings, running as high as 190 systolic.  His daughter Jeryl) is with him in the office today and she notes that he currently checks his BP daily on a device that was given to him by [employer].  They monitor readings remotely and follow up with her (he doesn't always hear phone) monthly.  She notes that since the addition of HCTZ in December, his BP readings have been more stable, with most readings between 127-137 systolic.  Diastolic readings all appear to be WNL.  He tells me that he's not had any problem with swallowing pills or side effects from his medications.  Home aide helps with getting his medications set out on daily basis.      Blood Pressure Goal:  140/80 (age)  Current Medications:  carvedilol 12.5. mg bid, hctz 12.5 mg daily, hydralazine  25 mg prn SBP > 170  Previously tried:  amlodipine  - added to existing LEE  Social Hx:      Tobacco: quit in 1986  Alcohol: no  Caffeine: Dr Nunzio 12 oz daily, some coffee  Diet: not a lot of protein, but does eat hamburger, pot roast, ham; has care givers who help with meals as well as daughter/son-in-law nearby who bring meals.  Eats some vegetables, but not regularly; doesn't snack much  Exercise: was getting PT, but d/c 2/2 elevated BP a few  months back  Home BP readings:  127-137 systolic per daughter     Accessory Clinical Findings    Lab Results  Component Value Date   CREATININE 1.38 (H) 03/12/2024   BUN 23 03/12/2024   NA 140 03/12/2024   K 4.8 03/12/2024   CL 101 03/12/2024   CO2 26 03/12/2024   Lab Results  Component Value Date   ALT 32 04/10/2023   AST 26 04/10/2023   ALKPHOS 60 04/10/2023   BILITOT 0.4 04/10/2023   Lab Results  Component Value Date   HGBA1C 6.2 (H) 04/10/2023    Home Medications    Current Outpatient Medications  Medication Sig Dispense Refill   acetaminophen  (TYLENOL ) 500 MG tablet Take 1,000 mg by mouth every 6 (six) hours as needed for moderate pain. (Patient taking differently: Take 325 mg by mouth every 6 (six) hours as needed for moderate pain (pain score 4-6). 2 tablets as needed)     albuterol  (VENTOLIN  HFA) 108 (90 Base) MCG/ACT inhaler as needed for wheezing or shortness of breath.     carvedilol (COREG) 12.5 MG tablet 2 (two) times daily with a meal.     cetirizine  (ZYRTEC ) 10 MG tablet Take 10 mg by mouth at bedtime.      Cholecalciferol  (VITAMIN D3) 2000 UNITS capsule Take 2,000 Units by mouth 3 (three) times a week.  Coenzyme Q10 (CO Q-10) 300 MG CAPS Take 300 mg by mouth daily.     cyanocobalamin  (VITAMIN B12) 1000 MCG tablet Take 1,000 mcg by mouth daily.     diclofenac  Sodium (VOLTAREN ) 1 % GEL Apply 2 g topically 4 (four) times daily. (Patient taking differently: Apply 2 g topically 4 (four) times daily as needed (pain).) 100 g 1   esomeprazole  (NEXIUM ) 20 MG capsule Take 20 mg by mouth daily.  (Patient taking differently: Take 40 mg by mouth daily.)     FARXIGA 5 MG TABS tablet Take 5 mg by mouth daily.     fluocinonide (LIDEX) 0.05 % external solution Apply 1 application  topically every evening. 20 drops or 1 ml on the scalp     fluticasone  (FLONASE ) 50 MCG/ACT nasal spray Place 2 sprays into both nostrils at bedtime.      gabapentin  (NEURONTIN ) 300 MG capsule  Take 2 capsules (600 mg total) by mouth at bedtime. 180 capsule 3   hydrALAZINE  (APRESOLINE ) 25 MG tablet as directed.     hydrochlorothiazide  (MICROZIDE ) 12.5 MG capsule Take 1 capsule (12.5 mg total) by mouth daily. 90 capsule 3   metroNIDAZOLE  (METROGEL ) 0.75 % vaginal gel as needed (RASH ON face).     mometasone (ELOCON) 0.1 % cream Apply 1 application. topically daily.     ondansetron  (ZOFRAN ) 4 MG tablet Take 4 mg by mouth 3 (three) times daily as needed for nausea or vomiting.     polyethylene glycol (MIRALAX  / GLYCOLAX ) 17 g packet Take 17 g by mouth daily as needed for mild constipation.     saccharomyces boulardii (FLORASTOR) 250 MG capsule Take 250 mg by mouth 2 (two) times daily.     senna-docusate (SENOKOT-S) 8.6-50 MG tablet Take 2 tablets by mouth daily as needed for mild constipation.     solifenacin (VESICARE) 5 MG tablet Take 5 mg by mouth every morning.     tamsulosin  (FLOMAX ) 0.4 MG CAPS Take 0.4 mg by mouth daily.     traMADol  (ULTRAM ) 50 MG tablet Take 1-2 tablets (50-100 mg total) by mouth daily. (Patient taking differently: Take 100 mg by mouth every evening.) 30 tablet 0   triamcinolone  lotion (KENALOG ) 0.1 % Apply 1 application. topically daily.     No current facility-administered medications for this visit.     Assessment & Plan    Hypertension Assessment: BP is uncontrolled in office BP 144/62 mmHg;  above the goal (<140/80). Tolerates carvedilol and HCTZ well, without any side effects Has not taken any hydralazine  in the past month Denies SOB, palpitation, chest pain, headaches,or swelling Home readings monitored by Margarete MD  Plan:  Continue taking carvedilol 12.5. mg bid, hctz 12.5 mg daily, hydralazine  25 mg prn SBP > 170 Patient to keep record of BP readings with heart rate and report to us  at the next visit Patient to follow up with APP in 2-3 months  Labs ordered today:  none   Allean Mink PharmD CPP [facility]  [address] [place], [place] [address] [phone]       [1]  Allergies Allergen Reactions   Doxazosin Mesylate     fatigue   Lisinopril Cough   Amoxicillin -Pot Clavulanate Rash   Codeine Nausea Only   Macrolides And Ketolides Rash   "

## 2024-04-10 ENCOUNTER — Ambulatory Visit

## 2024-04-10 DIAGNOSIS — I442 Atrioventricular block, complete: Secondary | ICD-10-CM

## 2024-04-10 LAB — CUP PACEART REMOTE DEVICE CHECK
Battery Remaining Longevity: 108 mo
Battery Remaining Percentage: 92 %
Battery Voltage: 2.99 V
Brady Statistic AP VP Percent: 18 %
Brady Statistic AP VS Percent: 2.2 %
Brady Statistic AS VP Percent: 71 %
Brady Statistic AS VS Percent: 9.2 %
Brady Statistic RA Percent Paced: 20 %
Brady Statistic RV Percent Paced: 89 %
Date Time Interrogation Session: 20260122020028
Implantable Lead Connection Status: 753985
Implantable Lead Connection Status: 753985
Implantable Lead Implant Date: 20250121
Implantable Lead Implant Date: 20250121
Implantable Lead Location: 753859
Implantable Lead Location: 753860
Implantable Pulse Generator Implant Date: 20250121
Lead Channel Impedance Value: 440 Ohm
Lead Channel Impedance Value: 510 Ohm
Lead Channel Pacing Threshold Amplitude: 0.75 V
Lead Channel Pacing Threshold Amplitude: 1 V
Lead Channel Pacing Threshold Pulse Width: 0.5 ms
Lead Channel Pacing Threshold Pulse Width: 0.5 ms
Lead Channel Sensing Intrinsic Amplitude: 12 mV
Lead Channel Sensing Intrinsic Amplitude: 3.4 mV
Lead Channel Setting Pacing Amplitude: 1.25 V
Lead Channel Setting Pacing Amplitude: 1.75 V
Lead Channel Setting Pacing Pulse Width: 0.5 ms
Lead Channel Setting Sensing Sensitivity: 4 mV
Pulse Gen Model: 2272
Pulse Gen Serial Number: 8233318

## 2024-04-11 ENCOUNTER — Ambulatory Visit: Payer: Self-pay | Admitting: Cardiology

## 2024-04-12 NOTE — Progress Notes (Signed)
 Remote PPM Transmission

## 2024-07-10 ENCOUNTER — Encounter

## 2024-07-11 ENCOUNTER — Ambulatory Visit: Admitting: Internal Medicine

## 2024-10-09 ENCOUNTER — Encounter

## 2025-01-08 ENCOUNTER — Encounter

## 2025-04-09 ENCOUNTER — Encounter
